# Patient Record
Sex: Female | Born: 1960 | Race: White | Hispanic: No | Smoking: Former smoker
Health system: Southern US, Community
[De-identification: ages and names within clinical notes are randomized; demographics above are authoritative.]

## PROBLEM LIST (undated history)

## (undated) DIAGNOSIS — E782 Mixed hyperlipidemia: Secondary | ICD-10-CM

## (undated) DIAGNOSIS — I10 Essential (primary) hypertension: Secondary | ICD-10-CM

## (undated) DIAGNOSIS — K219 Gastro-esophageal reflux disease without esophagitis: Secondary | ICD-10-CM

## (undated) DIAGNOSIS — F419 Anxiety disorder, unspecified: Secondary | ICD-10-CM

## (undated) DIAGNOSIS — F329 Major depressive disorder, single episode, unspecified: Secondary | ICD-10-CM

## (undated) DIAGNOSIS — R569 Unspecified convulsions: Secondary | ICD-10-CM

## (undated) DIAGNOSIS — Z8669 Personal history of other diseases of the nervous system and sense organs: Secondary | ICD-10-CM

## (undated) DIAGNOSIS — I428 Other cardiomyopathies: Secondary | ICD-10-CM

## (undated) DIAGNOSIS — E7800 Pure hypercholesterolemia, unspecified: Secondary | ICD-10-CM

## (undated) DIAGNOSIS — F32A Depression, unspecified: Secondary | ICD-10-CM

## (undated) DIAGNOSIS — K602 Anal fissure, unspecified: Secondary | ICD-10-CM

## (undated) DIAGNOSIS — I5022 Chronic systolic (congestive) heart failure: Secondary | ICD-10-CM

## (undated) DIAGNOSIS — E119 Type 2 diabetes mellitus without complications: Secondary | ICD-10-CM

## (undated) HISTORY — DX: Mixed hyperlipidemia: E78.2

## (undated) HISTORY — DX: Other cardiomyopathies: I42.8

## (undated) HISTORY — DX: Essential (primary) hypertension: I10

## (undated) HISTORY — PX: DILATION AND CURETTAGE OF UTERUS: SHX78

## (undated) HISTORY — PX: FOOT BONE EXCISION: SUR493

## (undated) HISTORY — DX: Chronic systolic (congestive) heart failure: I50.22

---

## 2003-02-27 HISTORY — PX: BACK SURGERY: SHX140

## 2003-05-20 ENCOUNTER — Ambulatory Visit (HOSPITAL_COMMUNITY): Admission: RE | Admit: 2003-05-20 | Discharge: 2003-05-20 | Payer: Self-pay | Admitting: Family Medicine

## 2003-05-27 ENCOUNTER — Ambulatory Visit (HOSPITAL_COMMUNITY): Admission: RE | Admit: 2003-05-27 | Discharge: 2003-05-28 | Payer: Self-pay | Admitting: Neurological Surgery

## 2007-02-13 ENCOUNTER — Other Ambulatory Visit: Admission: RE | Admit: 2007-02-13 | Discharge: 2007-02-13 | Payer: Self-pay | Admitting: Obstetrics and Gynecology

## 2007-02-24 ENCOUNTER — Ambulatory Visit (HOSPITAL_COMMUNITY): Admission: RE | Admit: 2007-02-24 | Discharge: 2007-02-24 | Payer: Self-pay | Admitting: Obstetrics & Gynecology

## 2007-03-05 ENCOUNTER — Ambulatory Visit (HOSPITAL_COMMUNITY): Admission: RE | Admit: 2007-03-05 | Discharge: 2007-03-05 | Payer: Self-pay | Admitting: Obstetrics & Gynecology

## 2007-09-15 ENCOUNTER — Ambulatory Visit (HOSPITAL_COMMUNITY): Admission: RE | Admit: 2007-09-15 | Discharge: 2007-09-15 | Payer: Self-pay | Admitting: Obstetrics and Gynecology

## 2008-03-01 ENCOUNTER — Other Ambulatory Visit: Admission: RE | Admit: 2008-03-01 | Discharge: 2008-03-01 | Payer: Self-pay | Admitting: Obstetrics and Gynecology

## 2008-03-18 ENCOUNTER — Ambulatory Visit (HOSPITAL_COMMUNITY): Admission: RE | Admit: 2008-03-18 | Discharge: 2008-03-18 | Payer: Self-pay | Admitting: Family Medicine

## 2008-09-16 ENCOUNTER — Ambulatory Visit (HOSPITAL_COMMUNITY): Admission: RE | Admit: 2008-09-16 | Discharge: 2008-09-16 | Payer: Self-pay | Admitting: Obstetrics & Gynecology

## 2009-04-28 ENCOUNTER — Other Ambulatory Visit
Admission: RE | Admit: 2009-04-28 | Discharge: 2009-04-28 | Payer: Self-pay | Source: Home / Self Care | Admitting: Obstetrics and Gynecology

## 2010-03-10 ENCOUNTER — Inpatient Hospital Stay (HOSPITAL_COMMUNITY)
Admission: AD | Admit: 2010-03-10 | Discharge: 2010-03-16 | Payer: Self-pay | Source: Home / Self Care | Attending: Family Medicine | Admitting: Family Medicine

## 2010-03-13 LAB — CBC
HCT: 40.5 % (ref 36.0–46.0)
Hemoglobin: 14 g/dL (ref 12.0–15.0)
MCH: 27.9 pg (ref 26.0–34.0)
MCHC: 34.6 g/dL (ref 30.0–36.0)
MCV: 80.8 fL (ref 78.0–100.0)
Platelets: 399 10*3/uL (ref 150–400)
RBC: 5.01 MIL/uL (ref 3.87–5.11)
RDW: 13.7 % (ref 11.5–15.5)
WBC: 17.6 10*3/uL — ABNORMAL HIGH (ref 4.0–10.5)

## 2010-03-13 LAB — COMPREHENSIVE METABOLIC PANEL
ALT: 10 U/L (ref 0–35)
AST: 11 U/L (ref 0–37)
Albumin: 3.4 g/dL — ABNORMAL LOW (ref 3.5–5.2)
Alkaline Phosphatase: 70 U/L (ref 39–117)
BUN: 5 mg/dL — ABNORMAL LOW (ref 6–23)
CO2: 27 mEq/L (ref 19–32)
Calcium: 9.6 mg/dL (ref 8.4–10.5)
Chloride: 98 mEq/L (ref 96–112)
Creatinine, Ser: 0.64 mg/dL (ref 0.4–1.2)
GFR calc Af Amer: >60 mL/min (ref >60)
GFR calc non Af Amer: >60 mL/min (ref >60)
Glucose, Bld: 148 mg/dL — ABNORMAL HIGH (ref 70–99)
Potassium: 2.8 mEq/L — ABNORMAL LOW (ref 3.5–5.1)
Sodium: 136 mEq/L (ref 135–145)
Total Bilirubin: 0.5 mg/dL (ref 0.3–1.2)
Total Protein: 6.9 g/dL (ref 6.0–8.3)

## 2010-03-13 LAB — GLUCOSE, CAPILLARY
Glucose-Capillary: 186 mg/dL — ABNORMAL HIGH (ref 70–99)
Glucose-Capillary: 198 mg/dL — ABNORMAL HIGH (ref 70–99)
Glucose-Capillary: 200 mg/dL — ABNORMAL HIGH (ref 70–99)
Glucose-Capillary: 149 mg/dL — ABNORMAL HIGH (ref 70–99)
Glucose-Capillary: 164 mg/dL — ABNORMAL HIGH (ref 70–99)
Glucose-Capillary: 163 mg/dL — ABNORMAL HIGH (ref 70–99)
Glucose-Capillary: 151 mg/dL — ABNORMAL HIGH (ref 70–99)
Glucose-Capillary: 160 mg/dL — ABNORMAL HIGH (ref 70–99)
Glucose-Capillary: 118 mg/dL — ABNORMAL HIGH (ref 70–99)
Glucose-Capillary: 165 mg/dL — ABNORMAL HIGH (ref 70–99)

## 2010-03-13 LAB — URINALYSIS, ROUTINE W REFLEX MICROSCOPIC
Bilirubin Urine: NEGATIVE
Hgb urine dipstick: NEGATIVE
Ketones, ur: NEGATIVE mg/dL
Nitrite: NEGATIVE
Protein, ur: NEGATIVE mg/dL
Specific Gravity, Urine: <1.005 — ABNORMAL LOW (ref 1.005–1.030)
Urine Glucose, Fasting: 100 mg/dL — AB
Urobilinogen, UA: 0.2 mg/dL (ref 0.0–1.0)
pH: 5.5 (ref 5.0–8.0)

## 2010-03-13 LAB — BASIC METABOLIC PANEL
BUN: 5 mg/dL — ABNORMAL LOW (ref 6–23)
BUN: 2 mg/dL — ABNORMAL LOW (ref 6–23)
CO2: 28 mEq/L (ref 19–32)
CO2: 26 mEq/L (ref 19–32)
Calcium: 8.6 mg/dL (ref 8.4–10.5)
Calcium: 8.2 mg/dL — ABNORMAL LOW (ref 8.4–10.5)
Chloride: 100 mEq/L (ref 96–112)
Chloride: 104 mEq/L (ref 96–112)
Creatinine, Ser: 0.7 mg/dL (ref 0.4–1.2)
Creatinine, Ser: 0.61 mg/dL (ref 0.4–1.2)
GFR calc Af Amer: >60 mL/min (ref >60)
GFR calc Af Amer: >60 mL/min (ref >60)
GFR calc non Af Amer: >60 mL/min (ref >60)
GFR calc non Af Amer: >60 mL/min (ref >60)
Glucose, Bld: 150 mg/dL — ABNORMAL HIGH (ref 70–99)
Glucose, Bld: 171 mg/dL — ABNORMAL HIGH (ref 70–99)
Potassium: 3.3 mEq/L — ABNORMAL LOW (ref 3.5–5.1)
Potassium: 3.2 mEq/L — ABNORMAL LOW (ref 3.5–5.1)
Sodium: 138 mEq/L (ref 135–145)
Sodium: 138 mEq/L (ref 135–145)

## 2010-03-13 LAB — DIFFERENTIAL
Basophils Absolute: 0 10*3/uL (ref 0.0–0.1)
Basophils Relative: 0 % (ref 0–1)
Eosinophils Absolute: 0.1 10*3/uL (ref 0.0–0.7)
Eosinophils Relative: 0 % (ref 0–5)
Lymphocytes Relative: 11 % — ABNORMAL LOW (ref 12–46)
Lymphs Abs: 1.9 10*3/uL (ref 0.7–4.0)
Monocytes Absolute: 1.6 10*3/uL — ABNORMAL HIGH (ref 0.1–1.0)
Monocytes Relative: 9 % (ref 3–12)
Neutro Abs: 14.1 10*3/uL — ABNORMAL HIGH (ref 1.7–7.7)
Neutrophils Relative %: 80 % — ABNORMAL HIGH (ref 43–77)

## 2010-03-15 LAB — CBC
HCT: 35.5 % — ABNORMAL LOW (ref 36.0–46.0)
Hemoglobin: 11.6 g/dL — ABNORMAL LOW (ref 12.0–15.0)
MCH: 26.9 pg (ref 26.0–34.0)
MCHC: 32.7 g/dL (ref 30.0–36.0)
MCV: 82.4 fL (ref 78.0–100.0)
Platelets: 298 10*3/uL (ref 150–400)
RBC: 4.31 MIL/uL (ref 3.87–5.11)
RDW: 13.5 % (ref 11.5–15.5)
WBC: 8.2 10*3/uL (ref 4.0–10.5)

## 2010-03-15 LAB — GLUCOSE, CAPILLARY
Glucose-Capillary: 147 mg/dL — ABNORMAL HIGH (ref 70–99)
Glucose-Capillary: 174 mg/dL — ABNORMAL HIGH (ref 70–99)
Glucose-Capillary: 158 mg/dL — ABNORMAL HIGH (ref 70–99)
Glucose-Capillary: 179 mg/dL — ABNORMAL HIGH (ref 70–99)
Glucose-Capillary: 144 mg/dL — ABNORMAL HIGH (ref 70–99)
Glucose-Capillary: 192 mg/dL — ABNORMAL HIGH (ref 70–99)
Glucose-Capillary: 200 mg/dL — ABNORMAL HIGH (ref 70–99)

## 2010-03-15 LAB — BASIC METABOLIC PANEL
BUN: 3 mg/dL — ABNORMAL LOW (ref 6–23)
CO2: 27 mEq/L (ref 19–32)
Calcium: 7.9 mg/dL — ABNORMAL LOW (ref 8.4–10.5)
Chloride: 109 mEq/L (ref 96–112)
Creatinine, Ser: 0.6 mg/dL (ref 0.4–1.2)
GFR calc Af Amer: >60 mL/min (ref >60)
GFR calc non Af Amer: >60 mL/min (ref >60)
Glucose, Bld: 156 mg/dL — ABNORMAL HIGH (ref 70–99)
Potassium: 3.5 mEq/L (ref 3.5–5.1)
Sodium: 141 mEq/L (ref 135–145)

## 2010-03-15 LAB — DIFFERENTIAL
Basophils Absolute: 0 10*3/uL (ref 0.0–0.1)
Basophils Relative: 1 % (ref 0–1)
Eosinophils Absolute: 0.2 10*3/uL (ref 0.0–0.7)
Eosinophils Relative: 3 % (ref 0–5)
Lymphocytes Relative: 20 % (ref 12–46)
Lymphs Abs: 1.7 10*3/uL (ref 0.7–4.0)
Monocytes Absolute: 0.6 10*3/uL (ref 0.1–1.0)
Monocytes Relative: 7 % (ref 3–12)
Neutro Abs: 5.7 10*3/uL (ref 1.7–7.7)
Neutrophils Relative %: 69 % (ref 43–77)

## 2010-03-17 NOTE — Progress Notes (Signed)
  NAMEKARESS, HARNER                  ACCOUNT NO.:  [id number]  MEDICAL RECORD NO.:  [id number]          PATIENT TYPE:  INP  LOCATION:  A302                          FACILITY:  [facility]  PHYSICIAN:  Mila Homer. Sudie Bailey, M.D.DATE OF BIRTH:  15-May-1960  DATE OF PROCEDURE: DATE OF DISCHARGE:                                PROGRESS NOTE   SUBJECTIVE:  She is still nauseated and still has some left lower quadrant abdominal pain but feeling somewhat better.  OBJECTIVE:  Temperature is 97.9, pulse 76, respiratory rate 20, blood pressure 155/105.  Recheck 140/90. Today she is sitting up in bed, in mild distress due to the pain.  Well- developed, well-nourished, oriented and alert. LUNGS:  Clear throughout, moving air well. HEART:  Regular rhythm, rate of about 80. ABDOMEN:  Soft and mildly obese without organomegaly or mass.  She still does have tenderness on deep palpation of the left lower quadrant.  Sugar is 187.  ASSESSMENT: 1. Acute diverticulitis, improved. 2. Chronic obstructive pulmonary disease. 3. Diabetes. 4. Benign essential hypertension. 5. Hypercholesterolemia. 6. Reflux esophagitis. 7. Nausea, probably secondary to metronidazole.  PLAN:  Decrease metronidazole from 500 mg q.8 h. to 250 mg IV q.8 h. Resume lisinopril 20 mg daily, metformin 500 mg b.i.d., simvastatin 20 mg q.h.s., omeprazole 20 mg daily, vitamin D3 one thousand units daily. Hopefully, discharge home tomorrow on p.o. medications.     Mila Homer. Sudie Bailey, M.D.     SDK/MEDQ  D:  03/15/2010  T:  03/15/2010  Job:  [id number]  Electronically Signed by John Giovanni M.D. on 03/17/2010 07:14:49 AM

## 2010-03-17 NOTE — Discharge Summary (Signed)
NAMEJESSICAANN, [name]                  ACCOUNT NO.:  [id number]  MEDICAL RECORD NO.:  [id number]          PATIENT TYPE:  INP  LOCATION:  A302                          FACILITY:  [facility]  PHYSICIAN:  [name], M.D.DATE OF BIRTH:  1960/05/15  DATE OF ADMISSION:  03/10/2010 DATE OF DISCHARGE:  LH                              DISCHARGE SUMMARY   This 50 year old was admitted to the hospital with acute diverticulitis. She had a fairly benign 7-day hospitalization extending from January 13 to March 16, 2010.  Vital signs remained stable.  Her admission white cell count was 17,600 of which 80% were neutrophils. Hemoglobin was 14.  Recheck CBC 3 days later showed a white cell count 8200 with 69 neutrophils, hemoglobin 11.6.  Admission CMP showed a potassium of 2.8, glucose 148, albumin 3.4 and recheck BMP showed a potassium 3.3, a glucose 150, and then at 3.2 with glucose 171 and a potassium of 3.5, a glucose 156 three days prior to discharge.  Her admission UA was negative.  CT scan of the abdomen and pelvis showed acute diverticulitis and a distal descending of proximal sigmoid colon without abscess or perforation.  He also showed cholelithiasis without acute cholecystitis. There is mild biliary ductal dilatation.  There are bronchitic changes at the lung bases.  The largest gallstone is 2.2 cm, but there were numerous other ones noted.  She was admitted to the hospital.  Put on IV of normal saline at 100 mL an hour, and started on Levaquin 500 mg IV daily, metronidazole 500 mg IV q.8 hours.  She had Accu-Cheks a.c. and h.s. with sensitive sliding scale insulin, and put on NicoDerm 21 mg patch with Vicodin 5/500, one to two q.4 hours for severe pain and Dilaudid 1 mg IV q.3 hours for severe pain.  She was also given Zofran 4 mg IV q.6 hours for nausea, vomiting.  She had runs of potassium to bring her potassium level up to normal, and was given Mylanta for heartburn.  By her  fourth day IV was DC'd, and she switched to Hep-Lock.  Two days later her metronidazole was decreased to 250 mg IV q.8,  and she had persistent nausea.  Despite this decrease she still had nausea after eating a meal which she had not had prior to her hospitalization. Otherwise, her left lower quadrant abdominal pain much improved.  Her heart rate had dropped from its admission 140 checked in my office to normal, and she was much improved and ready for discharge home.  She was discharged home on:  1. Cipro 500 mg b.i.d. for a 10-day course. 2. Metronidazole 250 mg t.i.d. for a 10-day course. 3. Hydrocodone/APAP 5/325 one to two tablets q.4 hours for pain (50,     no refills). 4. Odansetron  4 mg q.4 hours for nausea, vomiting (30, no refills). 5. She is to continue lisinopril at 20 mg daily. 6. Metformin 500 mg b.i.d. 7. Omeprazole 20 mg daily. 8. Simvastatin 20 mg q.h.s. 9. Vitamin D3 1000 units daily. 10.Acetaminophen as needed.  FINAL DISCHARGE DIAGNOSES: 1. Acute diverticulitis. 2. Cholelithiasis. 3. Benign essential  hypertension. 4. Type 2 diabetes. 5. Hypercholesterolemia. 6. Tobacco use disorder. 7. Hypokalemia.  FOLLOW-UP:  Was to be in the office in 6 days.     [name], M.D.     SDK/MEDQ  D:  03/16/2010  T:  03/16/2010  Job:  [id number]  Electronically Signed by [name] M.D. on 03/17/2010 07:13:53 AM

## 2010-03-17 NOTE — H&P (Signed)
[name], [name]                  ACCOUNT NO.:  [id number]  MEDICAL RECORD NO.:  [id number]          PATIENT TYPE:  INP  LOCATION:  A302                          FACILITY:  [facility]  PHYSICIAN:  Mila Homer. Sudie Bailey, M.D.DATE OF BIRTH:  1960-08-10  DATE OF ADMISSION:  03/10/2010 DATE OF DISCHARGE:  LH                             HISTORY & PHYSICAL   HISTORY OF PRESENT ILLNESS:  This 50 year old woman developed generalized abdominal pain starting about 5 days prior to admission.  It waxed and waned over the course of the week and then the day prior to admission it remained severe so she came to my office for evaluation Friday, January 13.  At this time she was having pain in the left lower quadrant.  She noted she had similar pain about 10 years ago but did not really know what caused it.  The patient has had really no bowel or bladder symptoms.  She has had no cardiopulmonary symptomatology or neurological symptomatology.  CHRONIC MEDICAL PROBLEMS: 1. Tobacco use disorder. 2,  Type 2 diabetes. 1. Diabetic neuropathy. 2. Obesity. 3. She probably also has COPD.  She was last seen in the office March of 2011.  The patient developed nausea yesterday but no vomiting, and has fever and chills.  ADMISSION EXAM:  Showed a middle-aged woman who was mildly toxic.  Her blood pressure 140/84 with a pulse of 140.  Repeat pulse of 130. SKIN:  Color was good.  Skin turgor normal.  Mucous membranes moist. She is well-developed, well-nourished, oriented and alert.  Speech and sentence structure were intact.  Sensorium was normal.  Mucous membranes were moist as noted.  There were negative anterior cervical and axillary lymph nodes. HEART:  Had a regular rhythm with a rate of 140 on initial exam.  There are no murmurs. LUNGS:  Were clear throughout, moving air well but decreased breath sounds. ABDOMEN:  Soft without tenderness on the right, but severe tenderness to palpation of the left  lower quadrant and a little bit in the left upper quadrant.  She had no CVA or flank pain tenderness.  There is no edema of the ankles.  ADMISSION DIAGNOSES: 1. Left lower quadrant abdominal pain, probably secondary to acute     diverticulitis. 2. Type 2 diabetes. 3. Tobacco use disorder. 4. Obesity. 5. Diabetic peripheral neuropathy. 6. Probable chronic obstructive pulmonary disease.  PLAN: 1. Admit her to the hospital. 2. Get a STAT CT of the abdomen. 3. STAT CBC, SMA-12 and MET-7 are pending as is an A1c. 4. She will be on Levaquin 500 mg IV daily and metronidazole 500 mg IV     every 8 hours. 5. I will keep her on NicoDerm 21 mg patch with Vicodin 5/500 one to     two eery 4 hours for pain and Dilaudid a milligram IV every 3 hours     for severe pain. 6. She will be followed by the doctor on-call this weekend, Dr.     Janna Arch.     Mila Homer. Sudie Bailey, M.D.     SDK/MEDQ  D:  03/10/2010  T:  03/10/2010  Job:  [id number]  Electronically Signed by John Giovanni M.D. on 03/17/2010 07:13:44 AM

## 2010-03-17 NOTE — Progress Notes (Signed)
  NAMEEMMALIN, JAQUESS                  ACCOUNT NO.:  [id number]  MEDICAL RECORD NO.:  [id number]          PATIENT TYPE:  INP  LOCATION:  A302                          FACILITY:  [facility]  PHYSICIAN:  Mila Homer. Sudie Bailey, M.D.DATE OF BIRTH:  August 17, 1960  DATE OF PROCEDURE: DATE OF DISCHARGE:                                PROGRESS NOTE   SUBJECTIVE:  She is still having some pain in the left lower quadrant. She has also had a headache.  OBJECTIVE:  She is sitting up in bed.  She is in no acute distress at present, well-developed and obese.  Temperature is 97.9, pulse 91, respiratory rate 20, blood pressure 161/99. LUNGS:  Clear throughout and she is moving air well. HEART:  Regular rhythm and rate about 80. ABDOMEN:  Soft without organomegaly or mass except there is tenderness on deep palpation of the left lower quadrant.  O2 sats 94% room air and her glucose is 144.  ASSESSMENT: 1. Acute diverticulitis, improved. 2. Chronic obstructive pulmonary disease. 3. Hyperglycemia.  PLAN:  Continue IV antibiotics.  She is off the IV fluids.  Recheck a CBC, BMP and a sed rate in the morning.     Mila Homer. Sudie Bailey, M.D.     SDK/MEDQ  D:  03/14/2010  T:  03/14/2010  Job:  [id number]  Electronically Signed by John Giovanni M.D. on 03/17/2010 07:14:41 AM

## 2010-03-17 NOTE — Progress Notes (Signed)
  NAMEBRELEIGH, CARPINO                  ACCOUNT NO.:  [id number]  MEDICAL RECORD NO.:  [id number]          PATIENT TYPE:  INP  LOCATION:  A302                          FACILITY:  [facility]  PHYSICIAN:  Mila Homer. Sudie Bailey, M.D.DATE OF BIRTH:  1960/12/09  DATE OF PROCEDURE: DATE OF DISCHARGE:                                PROGRESS NOTE   SUBJECTIVE:  Generally, she is better, but she did have nausea last night.  OBJECTIVE:  VITAL SIGNS:  Temperature is 97.6, pulse 80, respiratory rate 18, blood pressure 135/84. GENERAL: She is somewhat recumbent bed.  No acute distress.  Well- developed, well-nourished. LUNGS:  Fairly clear throughout, and she is moving air well without intercostal retraction without use accessory muscle respiration. HEART:  Regular rhythm rate about 80. ABDOMEN:  Soft without organomegaly or masses.  She still does have some tenderness in the left lower quadrant.  LABORATORY DATA:  Her white cell count is 8200 with 69% neutrophils, 20 lymphs.  Hemoglobin 11.6.  Her glucose is 156, BUN 3.  On admission she had a white cell count 17,600.  Abdominal CT scan did show acute diverticulitis in the distal descending colon  She also had cholelithiasis and findings consistent with bronchitis or asthma in the lung bases.  ASSESSMENT: 1. Acute diverticulitis. 2. Probable chronic obstructive pulmonary disease . 3. Cholelithiasis. 4. Degenerative disk disease at L4-5 and L5-S1. 5. Hyperglycemia.  PLAN:  Reviewed her glucoses today which were 147 and 165.  O2 sat was stable at 97%.  We will discontinue the IV, which is currently 100 mL an hour, switch to Hep-Lock, get her up in a chair to see if she feels better during the course of the day with possible discharge tomorrow or the next day.     Mila Homer. Sudie Bailey, M.D.    SDK/MEDQ  D:  03/13/2010  T:  03/13/2010  Job:  [id number]  Electronically Signed by John Giovanni M.D. on 03/17/2010 07:14:35 AM

## 2010-03-20 LAB — GLUCOSE, CAPILLARY
Glucose-Capillary: 187 mg/dL — ABNORMAL HIGH (ref 70–99)
Glucose-Capillary: 151 mg/dL — ABNORMAL HIGH (ref 70–99)
Glucose-Capillary: 187 mg/dL — ABNORMAL HIGH (ref 70–99)
Glucose-Capillary: 172 mg/dL — ABNORMAL HIGH (ref 70–99)
Glucose-Capillary: 221 mg/dL — ABNORMAL HIGH (ref 70–99)
Glucose-Capillary: 149 mg/dL — ABNORMAL HIGH (ref 70–99)

## 2010-06-16 ENCOUNTER — Other Ambulatory Visit (HOSPITAL_COMMUNITY)
Admission: RE | Admit: 2010-06-16 | Discharge: 2010-06-16 | Disposition: A | Discharge status: Home / Self Care | Payer: Medicaid Other | Source: Ambulatory Visit | Attending: Obstetrics and Gynecology | Admitting: Obstetrics and Gynecology

## 2010-06-16 ENCOUNTER — Other Ambulatory Visit: Payer: Self-pay | Admitting: Obstetrics & Gynecology

## 2010-06-16 DIAGNOSIS — Z01419 Encounter for gynecological examination (general) (routine) without abnormal findings: Secondary | ICD-10-CM | POA: Insufficient documentation

## 2010-06-16 DIAGNOSIS — N6310 Unspecified lump in the right breast, unspecified quadrant: Secondary | ICD-10-CM

## 2010-06-21 ENCOUNTER — Ambulatory Visit (HOSPITAL_COMMUNITY)
Admission: RE | Admit: 2010-06-21 | Discharge: 2010-06-21 | Disposition: A | Discharge status: Home / Self Care | Payer: Medicaid Other | Source: Ambulatory Visit | Attending: Obstetrics & Gynecology | Admitting: Obstetrics & Gynecology

## 2010-06-21 ENCOUNTER — Other Ambulatory Visit (HOSPITAL_COMMUNITY): Payer: Self-pay | Admitting: Obstetrics & Gynecology

## 2010-06-21 DIAGNOSIS — N6310 Unspecified lump in the right breast, unspecified quadrant: Secondary | ICD-10-CM

## 2010-06-21 DIAGNOSIS — N63 Unspecified lump in unspecified breast: Secondary | ICD-10-CM | POA: Insufficient documentation

## 2010-07-14 NOTE — Op Note (Signed)
[name], [name]                            ACCOUNT NO.:  [id number]   MEDICAL RECORD NO.:  [id number]                   PATIENT TYPE:  OIB   LOCATION:  3025                                 FACILITY:  [facility]   PHYSICIAN:  Stefani Dama, M.D.               DATE OF BIRTH:  January 08, 1961   DATE OF PROCEDURE:  05/27/2003  DATE OF DISCHARGE:                                 OPERATIVE REPORT   PREOPERATIVE DIAGNOSIS:  Herniated nucleus pulposus, L5-S1, on right with  right lumbar radiculopathy.   POSTOPERATIVE DIAGNOSIS:  Herniated nucleus pulposus, L5-S1, on right with  right lumbar radiculopathy.   PROCEDURE:  METRx microendoscopic diskectomy, L5-S1 right with operating  microscope microdissection technique.   SURGEON:  Stefani Dama, M.D.   ANESTHESIA:  Dr. Kerri Perches.   INDICATIONS:  The patient is a 50 year old individual who has had  significant back and right leg pain.  She has evidence of a herniated  nucleus pulposus at L5-S1 causing compression of the S1 nerve root.  She has  been advised regarding surgical decompression.   PROCEDURE:  The patient was brought to the operating room supine on the  stretcher.  After smooth induction of general endotracheal anesthesia, she  was turned prone.  The back was shaved, prepped with Duraprep, and draped in  a sterile fashion.  Using fluoroscopic guidance, the L5-S1 interlaminar  space was localized in the PA projection and then a K-wire was passed down  to the laminar arch of L5 in the lateral projection and then a winding  technique was used to insert a series of dilators to the 18 mm diameter.  The interlaminar space was then fully identified at the L5-S1 side on the  right side and with the area being opened up, an 18 mm wide x 7 cm deep  endoscopic cannula was affixed to the operating table with clamp.  The  microscope was draped and brought into the field and then using  microdissection approach, the soft tissues were cleared  from the  interlaminar space at L5-S1 on the right side.  High speed air drill and 2.3  mm dissecting tool was then used to remove the inferior margin of the lamina  of L5 out to the medial wall of the facet.  The yellow ligament in this area  was then taken up until the dura was identified.  The common dural tube was  traced out laterally and in this area under some further redundant yellow  ligament there was noted to be the S1 nerve root tented dorsally.  It was  gently cleared, and the lateral aspect of the nerve root was identified so  that the structures could be dissected medially.  A root was tented over a  significant mass underneath it by gently using microdissection technique and  mobilizing the nerve root medially.  Several fragments of disk were  encountered.  These were removed and allowed for nearly immediate  decompression of the S1 nerve root.  Further fragments were removed, and  some were attached to an opening through the posterior longitudinal ligament  inferior to the disk space.  This entry entered into the disk space proper  and required opening with a 15 blade and removal with a combination of  punches and disk rongeurs to evacuate a significant quantity of degenerated  disk material from within the disk space itself.  Care was taken to  carefully evacuate the disk space as best as possible, both medially and  laterally.  Several other fragments of disk were removed from within the  disk space itself to allow for this process.  In the end the disk space was  cleared of all the free fragments of disk material that could be had from  within the space.  The area around the posterior longitudinal ligament was  carefully cauterized and dissected free and clear.  The S1 nerve root was  noted to be free and clear of any nerve root compromise.  The common dural  tube was intact.  No spinal fluid leaks were obtained.  Hemostasis in the  epidural space was meticulously  obtained.   Then the microscope was removed from the field; 0.5 cc of Fentanyl was  placed in the epidural space prior to this.  The lumbodorsal fascia was then  closed with a singular 3-0 Vicryl suture and 3-0 Vicryl was used in the  subcuticular tissues.  Dermabond was placed on the skin.   The patient tolerated the procedure well and was returned to the recovery  room in stable condition.                                               Stefani Dama, M.D.    [name]  D:  05/27/2003  T:  05/27/2003  Job:  [id number]

## 2011-09-25 ENCOUNTER — Telehealth (INDEPENDENT_AMBULATORY_CARE_PROVIDER_SITE_OTHER): Payer: Self-pay | Admitting: *Deleted

## 2011-09-25 ENCOUNTER — Other Ambulatory Visit (INDEPENDENT_AMBULATORY_CARE_PROVIDER_SITE_OTHER): Payer: Self-pay | Admitting: *Deleted

## 2011-09-25 ENCOUNTER — Encounter (HOSPITAL_COMMUNITY): Payer: Self-pay | Admitting: Pharmacy Technician

## 2011-09-25 DIAGNOSIS — K6289 Other specified diseases of anus and rectum: Secondary | ICD-10-CM

## 2011-09-25 DIAGNOSIS — R197 Diarrhea, unspecified: Secondary | ICD-10-CM

## 2011-09-25 DIAGNOSIS — Z1211 Encounter for screening for malignant neoplasm of colon: Secondary | ICD-10-CM

## 2011-09-25 MED ORDER — PEG-KCL-NACL-NASULF-NA ASC-C 100 G PO SOLR: 1.0000 | Freq: Once | ORAL | Status: DC

## 2011-09-25 NOTE — Telephone Encounter (Signed)
PCP/Requesting MD: Sudie Bailey  Name & DOB: [name] 10/01/1960     Procedure: tcs  Reason/Indication:  Diarrhea, rectal pain  Has patient had this procedure before?  no  If so, when, by whom and where?    Is there a family history of colon cancer?  no  Who?  What age when diagnosed?    Is patient diabetic?   borderline      Does patient have prosthetic heart valve?  no  Do you have a pacemaker?  no  Has patient had joint replacement within last 12 months?  no  Is patient on Coumadin, Plavix and/or Aspirin? no  Medications: omeprazole 20 mg daily, metformin 500 mg 2 in am & 2 in pm, lisinopril 10 mg daily, niaspan 500 mg dailt, vit D 3 1000 IU 5 tabs in morning, HCTZ 25 mg daily, lorazepam 1 mg daily, amitriptyline 10 mg daily  Allergies: nkda  Medication Adjustment: 1/2 metformin day before  Procedure date & time: 10/01/11 @ 730

## 2011-09-25 NOTE — Telephone Encounter (Signed)
agree

## 2011-09-25 NOTE — Telephone Encounter (Signed)
Patient needs movi prep 

## 2011-10-01 ENCOUNTER — Ambulatory Visit (HOSPITAL_COMMUNITY)
Admission: RE | Admit: 2011-10-01 | Discharge: 2011-10-01 | Disposition: A | Discharge status: Home / Self Care | Payer: Medicaid Other | Source: Ambulatory Visit | Attending: Internal Medicine | Admitting: Internal Medicine

## 2011-10-01 ENCOUNTER — Encounter (HOSPITAL_COMMUNITY): Payer: Self-pay

## 2011-10-01 ENCOUNTER — Encounter (HOSPITAL_COMMUNITY)
Admission: RE | Disposition: A | Discharge status: Home / Self Care | Payer: Self-pay | Source: Ambulatory Visit | Attending: Internal Medicine

## 2011-10-01 DIAGNOSIS — K6289 Other specified diseases of anus and rectum: Secondary | ICD-10-CM

## 2011-10-01 DIAGNOSIS — K602 Anal fissure, unspecified: Secondary | ICD-10-CM

## 2011-10-01 DIAGNOSIS — D126 Benign neoplasm of colon, unspecified: Secondary | ICD-10-CM

## 2011-10-01 DIAGNOSIS — E119 Type 2 diabetes mellitus without complications: Secondary | ICD-10-CM | POA: Insufficient documentation

## 2011-10-01 DIAGNOSIS — K5730 Diverticulosis of large intestine without perforation or abscess without bleeding: Secondary | ICD-10-CM

## 2011-10-01 DIAGNOSIS — R197 Diarrhea, unspecified: Secondary | ICD-10-CM

## 2011-10-01 DIAGNOSIS — K644 Residual hemorrhoidal skin tags: Secondary | ICD-10-CM | POA: Insufficient documentation

## 2011-10-01 DIAGNOSIS — Z01812 Encounter for preprocedural laboratory examination: Secondary | ICD-10-CM | POA: Insufficient documentation

## 2011-10-01 DIAGNOSIS — I10 Essential (primary) hypertension: Secondary | ICD-10-CM | POA: Insufficient documentation

## 2011-10-01 DIAGNOSIS — Z79899 Other long term (current) drug therapy: Secondary | ICD-10-CM | POA: Insufficient documentation

## 2011-10-01 HISTORY — PX: COLONOSCOPY: SHX5424

## 2011-10-01 HISTORY — DX: Anxiety disorder, unspecified: F41.9

## 2011-10-01 HISTORY — DX: Gastro-esophageal reflux disease without esophagitis: K21.9

## 2011-10-01 HISTORY — DX: Unspecified convulsions: R56.9

## 2011-10-01 HISTORY — DX: Essential (primary) hypertension: I10

## 2011-10-01 HISTORY — DX: Major depressive disorder, single episode, unspecified: F32.9

## 2011-10-01 HISTORY — DX: Depression, unspecified: F32.A

## 2011-10-01 SURGERY — COLONOSCOPY
Anesthesia: Moderate Sedation

## 2011-10-01 MED ORDER — MEPERIDINE HCL 50 MG/ML IJ SOLN
INTRAMUSCULAR | Status: AC
  Filled 2011-10-01: qty 1

## 2011-10-01 MED ORDER — SODIUM CHLORIDE 0.45 % IV SOLN
Freq: Once | INTRAVENOUS | Status: AC
  Administered 2011-10-01: 07:00:00 via INTRAVENOUS

## 2011-10-01 MED ORDER — NITROGLYCERIN 0.4 % RE OINT: 0.5000 [in_us] | TOPICAL_OINTMENT | Freq: Two times a day (BID) | RECTAL | Status: DC

## 2011-10-01 MED ORDER — MEPERIDINE HCL 50 MG/ML IJ SOLN
INTRAMUSCULAR | Status: DC | PRN
  Administered 2011-10-01: 25 mg via INTRAVENOUS

## 2011-10-01 MED ORDER — STERILE WATER FOR IRRIGATION IR SOLN
Status: DC | PRN
  Administered 2011-10-01: 08:00:00

## 2011-10-01 MED ORDER — MIDAZOLAM HCL 5 MG/5ML IJ SOLN
INTRAMUSCULAR | Status: DC | PRN
  Administered 2011-10-01 (×4): 2 mg via INTRAVENOUS

## 2011-10-01 MED ORDER — NYSTATIN-TRIAMCINOLONE 100000-0.1 UNIT/GM-% EX OINT: TOPICAL_OINTMENT | Freq: Every day | CUTANEOUS | Status: AC

## 2011-10-01 MED ORDER — LIDOCAINE HCL 2 % EX GEL
CUTANEOUS | Status: DC | PRN
  Administered 2011-10-01: 1 via TOPICAL

## 2011-10-01 MED ORDER — MIDAZOLAM HCL 5 MG/5ML IJ SOLN
INTRAMUSCULAR | Status: AC
  Filled 2011-10-01: qty 10

## 2011-10-01 MED ORDER — DICYCLOMINE HCL 10 MG PO CAPS: 10.0000 mg | ORAL_CAPSULE | Freq: Two times a day (BID) | ORAL | Status: DC

## 2011-10-01 NOTE — H&P (Signed)
[name] is an 51 y.o. female.   Chief Complaint: Patient is here for colonoscopy. HPI: Patient is 51 year old Caucasian female who is a year for 3 years. She generally has 4-5 bowel movements per day. She also has intermittent nocturnal diarrhea. For the last 3 weeks she's been having solution rectal pain with bowel movements. She was seen by Dr. Sudie Bailey and felt to have internal hemorrhoids and given Anusol-HC suppository using this medication made her pain worse. She denies abdominal pain or rectal bleeding. She does not take NSAIDs. Family history is negative for colorectal carcinoma  or IBD.  Past Medical History  Diagnosis Date  . Hypertension   . Diabetes mellitus   . Anxiety   . Depression   . GERD (gastroesophageal reflux disease)   . Seizures     none since age 74-no meds since age 18 and no seizures since ge 6    Past Surgical History  Procedure Date  . Dilation and curettage of uterus   . Back surgery 2005    lumbar disckectomy  . Foot bone excision     right    History reviewed. No pertinent family history. Social History:  reports that she has been smoking Cigarettes.  She has a 10 pack-year smoking history. She does not have any smokeless tobacco history on file. She reports that she does not drink alcohol or use illicit drugs.  Allergies: No Known Allergies  Medications Prior to Admission  Medication Sig Dispense Refill  . amitriptyline (ELAVIL) 10 MG tablet Take 10 mg by mouth at bedtime.      . cholecalciferol (VITAMIN D) 1000 UNITS tablet Take 5,000 Units by mouth daily.      . hydrochlorothiazide (HYDRODIURIL) 25 MG tablet Take 25 mg by mouth daily.      [name] lisinopril (PRINIVIL,ZESTRIL) 20 MG tablet Take 20 mg by mouth daily.      [name] LORazepam (ATIVAN) 1 MG tablet Take 1 mg by mouth at bedtime.      . metFORMIN (GLUCOPHAGE) 500 MG tablet Take 1,000 mg by mouth 2 (two) times daily.      . niacin (NIASPAN) 500 MG CR tablet Take 500 mg by mouth daily.      [name]  omeprazole (PRILOSEC) 20 MG capsule Take 20 mg by mouth daily.      . peg 3350 powder (MOVIPREP) 100 G SOLR Take 1 kit (100 g total) by mouth once.  1 kit  0    Results for orders placed during the hospital encounter of 10/01/11 (from the past 48 hour(s))  GLUCOSE, CAPILLARY     Status: Abnormal   Collection Time   10/01/11  7:11 AM      Component Value Range Comment   Glucose-Capillary 150 (*) 70 - 99 mg/dL    No results found.  ROS  Blood pressure 108/68, pulse 105, temperature 98 F (36.7 C), temperature source Oral, resp. rate 16, height 5\' 6"  (1.676 m), weight 184 lb (83.462 kg), last menstrual period 08/22/2011, SpO2 96.00%. Physical Exam  Constitutional: She appears well-developed and well-nourished.  HENT:  Mouth/Throat: Oropharynx is clear and moist.  Eyes: Conjunctivae are normal. No scleral icterus.  Neck: No thyromegaly present.  Cardiovascular: Normal rate and normal heart sounds.   No murmur heard. Respiratory: Effort normal.  GI: Soft. She exhibits no distension and no mass. There is no tenderness.  Musculoskeletal: She exhibits no edema.  Lymphadenopathy:    She has no cervical adenopathy.  Neurological: She is  alert.  Skin: Skin is warm and dry.     Assessment/Plan Chronic diarrhea. Rectal pain. Diagnostic colonoscopy.  Maelin Kurkowski U 10/01/2011, 7:34 AM

## 2011-10-01 NOTE — Op Note (Signed)
COLONOSCOPY PROCEDURE REPORT  PATIENT:  [name]  MR#:  [id number] Birthdate:  [birth year], 51 y.o., female Endoscopist:  Dr. Malissa Hippo, MD Referred By:  Dr. Mila Homer. Sudie Bailey, MD Procedure Date: 10/01/2011  Procedure:   Colonoscopy  Indications:  Patient is 51 year old Caucasian female with chronic diarrhea who is also having excruciating rectal pain with bowel movements. She is undergoing diagnostic colonoscopy.  Informed Consent:  The procedure and risks were reviewed with the patient and informed consent was obtained.  Medications:  Demerol 25 mg IV Versed 8 mg IV  Description of procedure:  After a digital rectal exam was performed, that colonoscope was advanced from the anus through the rectum and colon to the area of the cecum, ileocecal valve and appendiceal orifice. The cecum was deeply intubated. These structures were well-seen and photographed for the record. From the level of the cecum and ileocecal valve, the scope was slowly and cautiously withdrawn. The mucosal surfaces were carefully surveyed utilizing scope tip to flexion to facilitate fold flattening as needed. The scope was pulled down into the rectum where a thorough exam including retroflexion was performed. Terminal ileum was also examined.  Findings:   Prep excellent. Normal terminal ileum. 4 mm cecal polyp ablated via cold biopsy. 5 mm polyp snared from distal sigmoid colon. Scattered diverticula throughout the colon. Random biopsy taken from normal appearing mucosa of sigmoid colon. Normal rectal mucosa. Small hemorrhoids below the dentate line and single anal fissure.  Therapeutic/Diagnostic Maneuvers Performed:  See above  Complications:  None  Cecal Withdrawal Time:  17 minutes  Impression:  Normal terminal ileum. Pancolonic diverticulosis. No endoscopic evidence of colitis. Random biopsy taken from mucosa of sigmoid colon looking for microscopic colitis. Small cecal polyp ablated via cold  biopsy. 5 mm polyp snared from sigmoid colon. Small external hemorrhoids and anal fissure.  Recommendations:  Standard instructions given.  Dicyclomine 10 mg by mouth twice a day. Mycolog-II ointment to be applied to perineal area daily at bedtime. Nitroglycerin ointment 0.4% to be applied to anal canal twice daily for 4 weeks. High-fiber diet and fiber supplement 3-4 g daily. Office visit in 4 weeks.  REHMAN,NAJEEB U  10/01/2011 8:20 AM  CC: Dr. Milana Obey, MD & Dr. Bonnetta Barry ref. provider found

## 2011-10-01 NOTE — Progress Notes (Signed)
Patient given discharge instructions polyps and colonoscopy. Instructed to go by [facility] pharmacy and pick of prescriptions. Follow up appt with dr Karilyn Cota sept 9 at 10:30

## 2011-10-03 ENCOUNTER — Encounter (HOSPITAL_COMMUNITY): Payer: Self-pay | Admitting: Internal Medicine

## 2011-10-04 ENCOUNTER — Encounter (INDEPENDENT_AMBULATORY_CARE_PROVIDER_SITE_OTHER): Payer: Self-pay | Admitting: *Deleted

## 2011-10-04 ENCOUNTER — Telehealth (INDEPENDENT_AMBULATORY_CARE_PROVIDER_SITE_OTHER): Payer: Self-pay | Admitting: *Deleted

## 2011-10-04 NOTE — Telephone Encounter (Signed)
Patient had TCS on 10/01/11, she calls in today to see if we have samples for acid reflux

## 2011-10-05 NOTE — Telephone Encounter (Signed)
Samples left at front desk 

## 2011-10-05 NOTE — Telephone Encounter (Signed)
[name] - Would you please call and see what PPI she may need ,Thank You

## 2011-11-05 ENCOUNTER — Ambulatory Visit (INDEPENDENT_AMBULATORY_CARE_PROVIDER_SITE_OTHER): Payer: Medicaid Other | Admitting: Internal Medicine

## 2011-11-05 ENCOUNTER — Encounter (INDEPENDENT_AMBULATORY_CARE_PROVIDER_SITE_OTHER): Payer: Self-pay | Admitting: Internal Medicine

## 2011-11-05 VITALS — BP 102/68 | HR 78 | Temp 98.2°F | Resp 20 | Ht 66.0 in | Wt 184.2 lb

## 2011-11-05 DIAGNOSIS — E119 Type 2 diabetes mellitus without complications: Secondary | ICD-10-CM | POA: Insufficient documentation

## 2011-11-05 DIAGNOSIS — K602 Anal fissure, unspecified: Secondary | ICD-10-CM

## 2011-11-05 DIAGNOSIS — I10 Essential (primary) hypertension: Secondary | ICD-10-CM | POA: Insufficient documentation

## 2011-11-05 DIAGNOSIS — R197 Diarrhea, unspecified: Secondary | ICD-10-CM | POA: Insufficient documentation

## 2011-11-05 DIAGNOSIS — E1122 Type 2 diabetes mellitus with diabetic chronic kidney disease: Secondary | ICD-10-CM | POA: Insufficient documentation

## 2011-11-05 DIAGNOSIS — K589 Irritable bowel syndrome without diarrhea: Secondary | ICD-10-CM

## 2011-11-05 MED ORDER — PSYLLIUM 55.46 % PO POWD: 1.0000 | Freq: Every day | ORAL | Status: DC

## 2011-11-05 MED ORDER — NITROGLYCERIN 0.4 % RE OINT: 0.5000 [in_us] | TOPICAL_OINTMENT | Freq: Two times a day (BID) | RECTAL | Status: DC

## 2011-11-05 NOTE — Progress Notes (Signed)
Presenting complaint;  Followup for diarrhea and anorectal pain.  Subjective:  Patient is 51 year old Caucasian female who was recently evaluated for diarrhea and excruciating anorectal pain. She underwent colonoscopy on 10/01/2011. She had 2 small adenomas removed from her colon. She had scattered diverticula throughout the colon. Random biopsies from sigmoid colon were normal. She also had small external hemorrhoids and anal fissure. She was begun on dicyclomine topical nitroglycerin ointment 2 in canal twice a day and Mycolog-II to be applied to perianal area. She was also advised to take fiber supplements daily. She did not keep stool diary as recommended. She has noted decrease in her diarrhea. Actually this morning she thought she was constipated. While on nitroglycerin ointment she noted decrease in spells of severe pain but she had a bad weekend. She states her heartburn is well controlled with therapy. She denies melena, rectal bleeding or abdominal pain. She says she has lost 20 pounds in the last year. She is not sure how much weight she has lost and the last 3 months. She remains under a lot of stress because of family issues.  Current Medications: Current Outpatient Prescriptions  Medication Sig Dispense Refill  . amitriptyline (ELAVIL) 10 MG tablet Take 10 mg by mouth at bedtime.      . dicyclomine (BENTYL) 10 MG capsule Take 1 capsule (10 mg total) by mouth 2 (two) times daily.  60 capsule  5  . hydrochlorothiazide (HYDRODIURIL) 25 MG tablet Take 25 mg by mouth daily.      [name] lisinopril (PRINIVIL,ZESTRIL) 20 MG tablet Take 20 mg by mouth daily.      [name] LORazepam (ATIVAN) 1 MG tablet Take 1 mg by mouth at bedtime.      . metFORMIN (GLUCOPHAGE) 500 MG tablet Take 1,000 mg by mouth 2 (two) times daily.      . niacin (NIASPAN) 500 MG CR tablet Take 500 mg by mouth daily.      . Nitroglycerin 0.4 % OINT Place 0.5 inches rectally 2 (two) times daily.  30 g  1  . norethindrone  (MICRONOR,CAMILA,ERRIN) 0.35 MG tablet Take 1 tablet by mouth daily.      [name] nystatin-triamcinolone ointment (MYCOLOG) Apply topically at bedtime. Applied to perianal area daily at bedtime for 2 weeks and thereafter as needed  30 g  0  . omeprazole (PRILOSEC) 20 MG capsule Take 20 mg by mouth daily.         Objective: Blood pressure 102/68, pulse 78, temperature 98.2 F (36.8 C), temperature source Oral, resp. rate 20, height 5\' 6"  (1.676 m), weight 184 lb 3.2 oz (83.553 kg), last menstrual period 09/25/2011. Patient is alert and in no acute distress. Conjunctiva is pink. Sclera is nonicteric Oropharyngeal mucosa is normal. No neck masses or thyromegaly noted. Abdomen is soft and nontender without organomegaly or masses. No LE edema or clubbing noted.   Assessment:  #1. Anorectal pain appears to be secondary to anal fissure documented on colonoscopy 5 weeks ago. If she fails to respond to second course of nitroglycerin ointment she will need surgical intervention.  Diarrhea seemed to aggravate her symptoms. Diarrhea appears to be secondary to IBS. #2. Chronic GERD. Symptoms well controlled with therapy.   Plan:  Metamucil one pack by mouth daily; samples given. Resume nitroglycerin ointment application to anal canal twice daily until office visit. Keep symptom diary until office visit in two-months.

## 2011-11-05 NOTE — Patient Instructions (Addendum)
Please keep stool diary as recommended.

## 2012-01-07 ENCOUNTER — Encounter (INDEPENDENT_AMBULATORY_CARE_PROVIDER_SITE_OTHER): Payer: Self-pay | Admitting: Internal Medicine

## 2012-01-07 ENCOUNTER — Ambulatory Visit (INDEPENDENT_AMBULATORY_CARE_PROVIDER_SITE_OTHER): Payer: Medicaid Other | Admitting: Internal Medicine

## 2012-01-07 VITALS — BP 100/62 | HR 88 | Temp 98.2°F | Ht 65.5 in | Wt 189.2 lb

## 2012-01-07 DIAGNOSIS — K602 Anal fissure, unspecified: Secondary | ICD-10-CM

## 2012-01-07 DIAGNOSIS — R197 Diarrhea, unspecified: Secondary | ICD-10-CM

## 2012-01-07 NOTE — Progress Notes (Addendum)
Subjective:     Patient ID: [name], female   DOB: 23-Apr-1960, 51 y.o.   MRN: [id number]  HPI 51 yr old female here today for f/u for diarrhea and excruciating anorectal pain. She underwent a colonoscopy 11/01/2011. She had 2 small adenomas removed from her colon. She had scattered diverticula thrughout the colon.  She also had small external hemorrhoids and an anal fissure. She tells me today she is doing good. She did have an episode about a month ago with rectal pain and used the Nitroglycerin oint. She says it cleared in about 2-3 weeks. There is no rectal pain now.  Stools are soft and some are very loose. She is having 3 stools a day. Appetite is good. No weight loss. She actually is trying to loose weight.  Sometimes she feels like foods are lodging.  Sometimes her medications are hard to get down. She usually has dysphagia to liquids. Acid reflux is controlled with omeprazole    Review of Systems see hpi Current Outpatient Prescriptions  Medication Sig Dispense Refill  . amitriptyline (ELAVIL) 10 MG tablet Take 10 mg by mouth at bedtime.      . dicyclomine (BENTYL) 10 MG capsule Take 1 capsule (10 mg total) by mouth 2 (two) times daily.  60 capsule  5  . hydrochlorothiazide (HYDRODIURIL) 25 MG tablet Take 25 mg by mouth daily.      [name] lisinopril (PRINIVIL,ZESTRIL) 20 MG tablet Take 20 mg by mouth daily.      [name] LORazepam (ATIVAN) 1 MG tablet Take 1 mg by mouth at bedtime.      . metFORMIN (GLUCOPHAGE) 500 MG tablet Take 500 mg by mouth 2 (two) times daily.       . niacin (NIASPAN) 500 MG CR tablet Take 500 mg by mouth daily.      [name] omeprazole (PRILOSEC) 20 MG capsule Take 20 mg by mouth daily.      . Nitroglycerin 0.4 % OINT Place 0.5 inches rectally 2 (two) times daily.  30 g  1  . norethindrone (MICRONOR,CAMILA,ERRIN) 0.35 MG tablet Take 1 tablet by mouth daily.      [name] nystatin-triamcinolone ointment (MYCOLOG) Apply topically at bedtime. Applied to perianal area daily at bedtime for  2 weeks and thereafter as needed  30 g  0   Past Medical History  Diagnosis Date  . Hypertension   . Diabetes mellitus   . Anxiety   . Depression   . GERD (gastroesophageal reflux disease)   . Seizures     none since age 52-no meds since age 24 and no seizures since ge 6       Objective:   Physical Exam  Filed Vitals:   01/07/12 0920  BP: 100/62  Pulse: 88  Temp: 98.2 F (36.8 C)  Height: 5' 5.5" (1.664 m)  Weight: 189 lb 3.2 oz (85.821 kg)   Alert and oriented. Skin warm and dry. Oral mucosa is moist.   . Sclera anicteric, conjunctivae is pink. Thyroid not enlarged. No cervical lymphadenopathy. Lungs clear. Heart regular rate and rhythm.  Abdomen is soft. Bowel sounds are positive. No hepatomegaly. No abdominal masses felt. No tenderness.  No edema to lower extremities. Patient is alert and oriented.      Assessment:   Anal fissure which has now healed. No rectal pain. She occasionally has loose stools. She usually has 3 stools a day. Was intolerant of Metamucil.     Plan:   Rectal pain from anal fissure.  which has now resolved. Diarrhea occasionally.  Try FiberOne bars Use NTG oint if you began to have rectal pain as before. Use small amt (pea size). Call our office if any problems. Prilosec samples (2 boxes given to patient).

## 2012-01-07 NOTE — Patient Instructions (Addendum)
[employer]. If anal fissure reoccurs, start the NTG oint. OV prn

## 2012-11-13 ENCOUNTER — Other Ambulatory Visit (HOSPITAL_COMMUNITY): Payer: Self-pay | Admitting: *Deleted

## 2012-11-13 ENCOUNTER — Ambulatory Visit (HOSPITAL_COMMUNITY)
Admission: RE | Admit: 2012-11-13 | Discharge: 2012-11-13 | Disposition: A | Discharge status: Home / Self Care | Payer: Disability Insurance | Source: Ambulatory Visit | Attending: Family Medicine | Admitting: Family Medicine

## 2012-11-13 DIAGNOSIS — M545 Low back pain, unspecified: Secondary | ICD-10-CM | POA: Insufficient documentation

## 2012-11-13 DIAGNOSIS — Z029 Encounter for administrative examinations, unspecified: Secondary | ICD-10-CM

## 2013-01-13 ENCOUNTER — Other Ambulatory Visit (HOSPITAL_COMMUNITY): Payer: Self-pay | Admitting: Physician Assistant

## 2013-01-13 DIAGNOSIS — Z139 Encounter for screening, unspecified: Secondary | ICD-10-CM

## 2013-01-15 ENCOUNTER — Encounter (INDEPENDENT_AMBULATORY_CARE_PROVIDER_SITE_OTHER): Payer: Self-pay | Admitting: *Deleted

## 2013-01-20 ENCOUNTER — Ambulatory Visit (HOSPITAL_COMMUNITY): Payer: Disability Insurance

## 2013-01-26 ENCOUNTER — Ambulatory Visit (HOSPITAL_COMMUNITY)
Admission: RE | Admit: 2013-01-26 | Discharge: 2013-01-26 | Disposition: A | Discharge status: Home / Self Care | Payer: Disability Insurance | Source: Ambulatory Visit | Attending: Physician Assistant | Admitting: Physician Assistant

## 2013-01-26 DIAGNOSIS — Z139 Encounter for screening, unspecified: Secondary | ICD-10-CM

## 2013-01-29 ENCOUNTER — Ambulatory Visit (INDEPENDENT_AMBULATORY_CARE_PROVIDER_SITE_OTHER): Payer: Disability Insurance | Admitting: Internal Medicine

## 2013-03-02 ENCOUNTER — Telehealth (HOSPITAL_COMMUNITY): Payer: Self-pay | Admitting: Dietician

## 2013-03-02 NOTE — Telephone Encounter (Signed)
Received call from pt at 1428. Registered for DM class on 03/17/13 at 1000. [facility]

## 2013-03-17 ENCOUNTER — Encounter (HOSPITAL_COMMUNITY): Payer: Self-pay | Admitting: Dietician

## 2013-03-17 NOTE — Progress Notes (Signed)
[facility] Diabetes Class Completion  Date:March 17, 2013  Time: 1000  Pt attended [facility] Education Class on March 17, 2013.   Patient was educated on the following topics:   -Survival skills (signs and symptoms of hyperglycemia and hypoglycemia, treatment for hypoglycemia, ideal levels for fasting and postprandial blood sugars, goal Hgb A1c level, foot care basics)  -Recommendations for physical activity   -Carbohydrate metabolism in relation to diabetes   -Meal planning (sources of carbohydrate, carbohydrate counting, meal planning strategies, food label reading, and portion control).  Handouts provided:  -"Diabetes and You: Taking Charge of Your Health"  -"Carbohydrate Counting and Meal Planning"  -"Your Guide to Better Office Visits"   Aryelle Figg A. Lanisa Ishler, RD, LDN  

## 2013-05-14 ENCOUNTER — Inpatient Hospital Stay (HOSPITAL_COMMUNITY)
Admission: EM | Admit: 2013-05-14 | Discharge: 2013-05-19 | DRG: 683 | Disposition: A | Discharge status: Home / Self Care | Payer: Medicaid Other | Attending: Internal Medicine | Admitting: Internal Medicine

## 2013-05-14 ENCOUNTER — Encounter (HOSPITAL_COMMUNITY): Payer: Self-pay | Admitting: Emergency Medicine

## 2013-05-14 DIAGNOSIS — E861 Hypovolemia: Secondary | ICD-10-CM | POA: Diagnosis present

## 2013-05-14 DIAGNOSIS — Z720 Tobacco use: Secondary | ICD-10-CM | POA: Diagnosis present

## 2013-05-14 DIAGNOSIS — K8020 Calculus of gallbladder without cholecystitis without obstruction: Secondary | ICD-10-CM | POA: Diagnosis present

## 2013-05-14 DIAGNOSIS — F411 Generalized anxiety disorder: Secondary | ICD-10-CM | POA: Diagnosis present

## 2013-05-14 DIAGNOSIS — E875 Hyperkalemia: Secondary | ICD-10-CM | POA: Diagnosis present

## 2013-05-14 DIAGNOSIS — E119 Type 2 diabetes mellitus without complications: Secondary | ICD-10-CM | POA: Diagnosis present

## 2013-05-14 DIAGNOSIS — R197 Diarrhea, unspecified: Secondary | ICD-10-CM | POA: Diagnosis present

## 2013-05-14 DIAGNOSIS — K219 Gastro-esophageal reflux disease without esophagitis: Secondary | ICD-10-CM | POA: Diagnosis present

## 2013-05-14 DIAGNOSIS — N179 Acute kidney failure, unspecified: Principal | ICD-10-CM | POA: Diagnosis present

## 2013-05-14 DIAGNOSIS — F17200 Nicotine dependence, unspecified, uncomplicated: Secondary | ICD-10-CM | POA: Diagnosis present

## 2013-05-14 DIAGNOSIS — K602 Anal fissure, unspecified: Secondary | ICD-10-CM | POA: Diagnosis present

## 2013-05-14 DIAGNOSIS — I10 Essential (primary) hypertension: Secondary | ICD-10-CM | POA: Diagnosis present

## 2013-05-14 DIAGNOSIS — E860 Dehydration: Secondary | ICD-10-CM

## 2013-05-14 DIAGNOSIS — K5732 Diverticulitis of large intestine without perforation or abscess without bleeding: Secondary | ICD-10-CM | POA: Diagnosis present

## 2013-05-14 DIAGNOSIS — Z794 Long term (current) use of insulin: Secondary | ICD-10-CM | POA: Diagnosis not present

## 2013-05-14 DIAGNOSIS — D72829 Elevated white blood cell count, unspecified: Secondary | ICD-10-CM | POA: Diagnosis present

## 2013-05-14 DIAGNOSIS — F3289 Other specified depressive episodes: Secondary | ICD-10-CM | POA: Diagnosis present

## 2013-05-14 DIAGNOSIS — Z3202 Encounter for pregnancy test, result negative: Secondary | ICD-10-CM

## 2013-05-14 DIAGNOSIS — K589 Irritable bowel syndrome without diarrhea: Secondary | ICD-10-CM | POA: Diagnosis present

## 2013-05-14 DIAGNOSIS — F329 Major depressive disorder, single episode, unspecified: Secondary | ICD-10-CM | POA: Diagnosis present

## 2013-05-14 DIAGNOSIS — E1122 Type 2 diabetes mellitus with diabetic chronic kidney disease: Secondary | ICD-10-CM | POA: Diagnosis present

## 2013-05-14 DIAGNOSIS — I959 Hypotension, unspecified: Secondary | ICD-10-CM | POA: Diagnosis present

## 2013-05-14 DIAGNOSIS — R109 Unspecified abdominal pain: Secondary | ICD-10-CM | POA: Diagnosis present

## 2013-05-14 HISTORY — DX: Anal fissure, unspecified: K60.2

## 2013-05-14 LAB — COMPREHENSIVE METABOLIC PANEL
ALK PHOS: 106 U/L (ref 39–117)
ALT: 12 U/L (ref 0–35)
AST: 12 U/L (ref 0–37)
Albumin: 3.8 g/dL (ref 3.5–5.2)
BILIRUBIN TOTAL: 0.3 mg/dL (ref 0.3–1.2)
BUN: 34 mg/dL — AB (ref 6–23)
CHLORIDE: 99 meq/L (ref 96–112)
CO2: 20 mEq/L (ref 19–32)
Calcium: 10.7 mg/dL — ABNORMAL HIGH (ref 8.4–10.5)
Creatinine, Ser: 1.75 mg/dL — ABNORMAL HIGH (ref 0.50–1.10)
GFR calc Af Amer: 38 mL/min — ABNORMAL LOW (ref >90)
GFR calc non Af Amer: 33 mL/min — ABNORMAL LOW (ref >90)
Glucose, Bld: 136 mg/dL — ABNORMAL HIGH (ref 70–99)
POTASSIUM: 6.1 meq/L — AB (ref 3.7–5.3)
SODIUM: 132 meq/L — AB (ref 137–147)
Total Protein: 7.4 g/dL (ref 6.0–8.3)

## 2013-05-14 LAB — CBC WITH DIFFERENTIAL/PLATELET
BASOS PCT: 0 % (ref 0–1)
Basophils Absolute: 0 10*3/uL (ref 0.0–0.1)
Eosinophils Absolute: 0.2 10*3/uL (ref 0.0–0.7)
Eosinophils Relative: 2 % (ref 0–5)
HCT: 39.3 % (ref 36.0–46.0)
Hemoglobin: 13.2 g/dL (ref 12.0–15.0)
LYMPHS ABS: 2.1 10*3/uL (ref 0.7–4.0)
Lymphocytes Relative: 19 % (ref 12–46)
MCH: 27.8 pg (ref 26.0–34.0)
MCHC: 33.6 g/dL (ref 30.0–36.0)
MCV: 82.9 fL (ref 78.0–100.0)
MONOS PCT: 10 % (ref 3–12)
Monocytes Absolute: 1.1 10*3/uL — ABNORMAL HIGH (ref 0.1–1.0)
NEUTROS ABS: 7.9 10*3/uL — AB (ref 1.7–7.7)
NEUTROS PCT: 69 % (ref 43–77)
Platelets: 321 10*3/uL (ref 150–400)
RBC: 4.74 MIL/uL (ref 3.87–5.11)
RDW: 13.8 % (ref 11.5–15.5)
WBC: 11.3 10*3/uL — ABNORMAL HIGH (ref 4.0–10.5)

## 2013-05-14 LAB — BASIC METABOLIC PANEL
BUN: 23 mg/dL (ref 6–23)
CALCIUM: 9.1 mg/dL (ref 8.4–10.5)
CO2: 22 meq/L (ref 19–32)
CREATININE: 1.29 mg/dL — AB (ref 0.50–1.10)
Chloride: 104 mEq/L (ref 96–112)
GFR calc Af Amer: 55 mL/min — ABNORMAL LOW (ref >90)
GFR, EST NON AFRICAN AMERICAN: 47 mL/min — AB (ref >90)
GLUCOSE: 90 mg/dL (ref 70–99)
Potassium: 5.2 mEq/L (ref 3.7–5.3)
Sodium: 136 mEq/L — ABNORMAL LOW (ref 137–147)

## 2013-05-14 LAB — GLUCOSE, CAPILLARY
Glucose-Capillary: 209 mg/dL — ABNORMAL HIGH (ref 70–99)
Glucose-Capillary: 91 mg/dL (ref 70–99)

## 2013-05-14 LAB — HEMOGLOBIN A1C
Hgb A1c MFr Bld: 6.7 % — ABNORMAL HIGH (ref <5.7)
Mean Plasma Glucose: 146 mg/dL — ABNORMAL HIGH (ref <117)

## 2013-05-14 LAB — LACTIC ACID, PLASMA: Lactic Acid, Venous: 2.3 mmol/L — ABNORMAL HIGH (ref 0.5–2.2)

## 2013-05-14 LAB — URINALYSIS, ROUTINE W REFLEX MICROSCOPIC
Bilirubin Urine: NEGATIVE
GLUCOSE, UA: NEGATIVE mg/dL
Hgb urine dipstick: NEGATIVE
KETONES UR: NEGATIVE mg/dL
Leukocytes, UA: NEGATIVE
NITRITE: NEGATIVE
Protein, ur: NEGATIVE mg/dL
Specific Gravity, Urine: >1.03 — ABNORMAL HIGH (ref 1.005–1.030)
Urobilinogen, UA: 0.2 mg/dL (ref 0.0–1.0)
pH: 5.5 (ref 5.0–8.0)

## 2013-05-14 LAB — TROPONIN I: Troponin I: <0.3 ng/mL (ref <0.30)

## 2013-05-14 LAB — PREGNANCY, URINE: Preg Test, Ur: NEGATIVE

## 2013-05-14 LAB — LIPASE, BLOOD: LIPASE: 57 U/L (ref 11–59)

## 2013-05-14 LAB — POTASSIUM: POTASSIUM: 6.4 meq/L — AB (ref 3.7–5.3)

## 2013-05-14 MED ORDER — HYDROCORTISONE ACE-PRAMOXINE 1-1 % RE FOAM
1.0000 | Freq: Two times a day (BID) | RECTAL | Status: DC
  Administered 2013-05-14 – 2013-05-15 (×3): 1 via RECTAL
  Filled 2013-05-14 (×2): qty 10

## 2013-05-14 MED ORDER — ACETAMINOPHEN 325 MG PO TABS
650.0000 mg | ORAL_TABLET | Freq: Four times a day (QID) | ORAL | Status: DC | PRN
  Administered 2013-05-15 – 2013-05-17 (×2): 650 mg via ORAL
  Filled 2013-05-14 (×2): qty 2

## 2013-05-14 MED ORDER — ACETAMINOPHEN 650 MG RE SUPP: 650.0000 mg | Freq: Four times a day (QID) | RECTAL | Status: DC | PRN

## 2013-05-14 MED ORDER — INSULIN ASPART 100 UNIT/ML IV SOLN
10.0000 [IU] | Freq: Once | INTRAVENOUS | Status: DC
  Administered 2013-05-14: 10 [IU] via INTRAVENOUS

## 2013-05-14 MED ORDER — DEXTROSE 50 % IV SOLN: 1.0000 | Freq: Once | INTRAVENOUS | Status: DC

## 2013-05-14 MED ORDER — ONDANSETRON HCL 4 MG/2ML IJ SOLN
4.0000 mg | Freq: Four times a day (QID) | INTRAMUSCULAR | Status: DC | PRN
  Administered 2013-05-18: 4 mg via INTRAVENOUS
  Filled 2013-05-14: qty 2

## 2013-05-14 MED ORDER — CITALOPRAM HYDROBROMIDE 20 MG PO TABS: 40.0000 mg | ORAL_TABLET | Freq: Every day | ORAL | Status: DC

## 2013-05-14 MED ORDER — CITALOPRAM HYDROBROMIDE 20 MG PO TABS
40.0000 mg | ORAL_TABLET | Freq: Every day | ORAL | Status: DC
  Administered 2013-05-14 – 2013-05-18 (×5): 40 mg via ORAL
  Filled 2013-05-14 (×5): qty 2

## 2013-05-14 MED ORDER — SODIUM CHLORIDE 0.9 % IJ SOLN
3.0000 mL | Freq: Two times a day (BID) | INTRAMUSCULAR | Status: DC
  Administered 2013-05-14 – 2013-05-18 (×8): 3 mL via INTRAVENOUS

## 2013-05-14 MED ORDER — HYDROCODONE-ACETAMINOPHEN 5-325 MG PO TABS
1.0000 | ORAL_TABLET | ORAL | Status: DC | PRN
  Administered 2013-05-15 – 2013-05-17 (×2): 1 via ORAL
  Filled 2013-05-14 (×2): qty 1

## 2013-05-14 MED ORDER — SODIUM CHLORIDE 0.9 % IV BOLUS (SEPSIS)
1000.0000 mL | Freq: Once | INTRAVENOUS | Status: AC
  Administered 2013-05-14: 1000 mL via INTRAVENOUS

## 2013-05-14 MED ORDER — SODIUM BICARBONATE 8.4 % IV SOLN
50.0000 meq | Freq: Once | INTRAVENOUS | Status: DC
  Administered 2013-05-14: 50 meq via INTRAVENOUS
  Filled 2013-05-14: qty 50

## 2013-05-14 MED ORDER — ONDANSETRON HCL 4 MG/2ML IJ SOLN
4.0000 mg | Freq: Once | INTRAMUSCULAR | Status: AC
  Administered 2013-05-14: 4 mg via INTRAVENOUS
  Filled 2013-05-14: qty 2

## 2013-05-14 MED ORDER — HYDROCORTISONE ACE-PRAMOXINE 1-1 % RE CREA
1.0000 "application " | TOPICAL_CREAM | Freq: Two times a day (BID) | RECTAL | Status: DC
  Filled 2013-05-14: qty 30

## 2013-05-14 MED ORDER — SODIUM POLYSTYRENE SULFONATE 15 GM/60ML PO SUSP
30.0000 g | Freq: Once | ORAL | Status: DC
  Administered 2013-05-14: 30 g via ORAL
  Filled 2013-05-14: qty 120

## 2013-05-14 MED ORDER — HEPARIN SODIUM (PORCINE) 5000 UNIT/ML IJ SOLN
5000.0000 [IU] | Freq: Three times a day (TID) | INTRAMUSCULAR | Status: DC
  Administered 2013-05-14 – 2013-05-19 (×15): 5000 [IU] via SUBCUTANEOUS
  Filled 2013-05-14 (×16): qty 1

## 2013-05-14 MED ORDER — MORPHINE SULFATE 4 MG/ML IJ SOLN: 4.0000 mg | Freq: Once | INTRAMUSCULAR | Status: DC

## 2013-05-14 MED ORDER — SODIUM CHLORIDE 0.9 % IV SOLN
INTRAVENOUS | Status: AC
  Administered 2013-05-14: 17:00:00 via INTRAVENOUS

## 2013-05-14 MED ORDER — SODIUM CHLORIDE 0.9 % IV SOLN: INTRAVENOUS | Status: DC

## 2013-05-14 MED ORDER — ONDANSETRON HCL 4 MG PO TABS
4.0000 mg | ORAL_TABLET | Freq: Four times a day (QID) | ORAL | Status: DC | PRN
  Administered 2013-05-15: 4 mg via ORAL
  Filled 2013-05-14: qty 1

## 2013-05-14 MED ORDER — DEXTROSE 50 % IV SOLN
INTRAVENOUS | Status: AC
  Administered 2013-05-14: 1
  Filled 2013-05-14: qty 50

## 2013-05-14 MED ORDER — ALUM & MAG HYDROXIDE-SIMETH 200-200-20 MG/5ML PO SUSP: 30.0000 mL | Freq: Four times a day (QID) | ORAL | Status: DC | PRN

## 2013-05-14 MED ORDER — INSULIN ASPART 100 UNIT/ML ~~LOC~~ SOLN
0.0000 [IU] | Freq: Three times a day (TID) | SUBCUTANEOUS | Status: DC
  Administered 2013-05-14: 5 [IU] via SUBCUTANEOUS
  Administered 2013-05-15: 3 [IU] via SUBCUTANEOUS
  Administered 2013-05-16 – 2013-05-17 (×2): 2 [IU] via SUBCUTANEOUS
  Administered 2013-05-19: 3 [IU] via SUBCUTANEOUS

## 2013-05-14 NOTE — ED Provider Notes (Signed)
CSN: [id number]     Arrival date & time 05/14/13  1052 History  This chart was scribed for Samuella Cota, MD by Allena Earing, ED Scribe. This patient was seen in room APA10/APA10 and the patient's care was started at 11:15 AM .     No chief complaint on file.     HPI   HPI Comments: [name] is a 53 y.o. female who presents to the Emergency Department complaining of a burning abdominal pain that has been worsening over the past 2 weeks, associated with rectal pain. Pt reports associated nausea and that anything that she consumes causes her pain. Pt reports 3 episodes of associated diarrhea yesterday and 1 today. Pt also reports associated dizziness, and rectal pain. Pt had a colonoscopy two years ago. Pt denies melena, vomiting, SOB, and fever.  Pt has DM. Pt no h/o abdominal pain. [facility] for deyhration and adominal pain. Pt mentioned several times that she was on anti-depressants  Past Medical History  Diagnosis Date  . Hypertension   . Diabetes mellitus   . Anxiety   . Depression   . GERD (gastroesophageal reflux disease)   . Seizures     none since age 18-no meds since age 23 and no seizures since ge 6  . Anal fissure    Past Surgical History  Procedure Laterality Date  . Dilation and curettage of uterus    . Back surgery  2005    lumbar disckectomy  . Foot bone excision      right  . Colonoscopy  10/01/2011    Procedure: COLONOSCOPY;  Surgeon: [name], MD;  [facility];  Service: Endoscopy;  Laterality: N/A;  730   Family History  Problem Relation Age of Onset  . Arthritis Mother   . Hernia Mother   . Constipation Mother    History  Substance Use Topics  . Smoking status: Current Every Day Smoker -- 0.50 packs/day for 20 years    Types: Cigarettes  . Smokeless tobacco: Never Used  . Alcohol Use: No   OB History   Grav Para Term Preterm Abortions TAB SAB Ect Mult Living                 Review of  Systems  Gastrointestinal: Positive for nausea, abdominal pain, diarrhea and rectal pain. Negative for vomiting and blood in stool.    A complete 10 system review of systems was obtained and all systems are negative except as noted in the HPI and PMH.     Allergies  Review of patient's allergies indicates no known allergies.  Home Medications   No current outpatient prescriptions on file. BP 84/55  Pulse 118  Temp(Src) 97.5 F (36.4 C) (Oral)  Resp 20  Ht 5\' 7"  (1.702 m)  Wt 175 lb (79.379 kg)  BMI 27.40 kg/m2  SpO2 100%  LMP 09/25/2011 Physical Exam  Nursing note and vitals reviewed. Constitutional: She is oriented to person, place, and time. She appears well-developed and well-nourished. She is active.  Non-toxic appearance. No distress.  HENT:  Head: Atraumatic.  Mouth/Throat: Oropharynx is clear and moist. No oropharyngeal exudate.  Mucosa membranes dry  Eyes: Conjunctivae and EOM are normal. Pupils are equal, round, and reactive to light.  Neck: Normal range of motion.  Cardiovascular: Regular rhythm, normal heart sounds and intact distal pulses.  Tachycardia present.   No murmur heard. Pulmonary/Chest: Effort normal and breath sounds normal. No  respiratory distress.  Abdominal: Soft. Normal appearance. There is tenderness in the epigastric area. There is no rebound and no guarding.  Tenderness in epigastrum, mild  Genitourinary: Rectal exam shows tenderness.  Small skin tears in anal area  Musculoskeletal: Normal range of motion. She exhibits no edema and no tenderness.  Neurological: She is alert and oriented to person, place, and time. She has normal reflexes. No cranial nerve deficit. She exhibits normal muscle tone. Coordination normal.  Skin: Skin is warm.    ED Course  Procedures (including critical care time) DIAGNOSTIC STUDIES: Oxygen Saturation is 100% on RA, normal by my interpretation.    COORDINATION OF CARE: 11:24 AM-Discussed treatment plan which  includes labs and an  EKG with pt at bedside and pt agreed to plan.      Labs Review Labs Reviewed  CBC WITH DIFFERENTIAL - Abnormal; Notable for the following:    WBC 11.3 (*)    Neutro Abs 7.9 (*)    Monocytes Absolute 1.1 (*)    All other components within normal limits  COMPREHENSIVE METABOLIC PANEL - Abnormal; Notable for the following:    Sodium 132 (*)    Potassium 6.1 (*)    Glucose, Bld 136 (*)    BUN 34 (*)    Creatinine, Ser 1.75 (*)    Calcium 10.7 (*)    GFR calc non Af Amer 33 (*)    GFR calc Af Amer 38 (*)    All other components within normal limits  URINALYSIS, ROUTINE W REFLEX MICROSCOPIC - Abnormal; Notable for the following:    Specific Gravity, Urine >1.030 (*)    All other components within normal limits  POTASSIUM - Abnormal; Notable for the following:    Potassium 6.4 (*)    All other components within normal limits  GLUCOSE, CAPILLARY - Abnormal; Notable for the following:    Glucose-Capillary 209 (*)    All other components within normal limits  LIPASE, BLOOD  PREGNANCY, URINE  TROPONIN I  HEMOGLOBIN A1C  GI PATHOGEN PANEL BY PCR, STOOL  BASIC METABOLIC PANEL   Imaging Review No results found.   EKG Interpretation   Date/Time:  Thursday May 14 2013 11:54:54 EDT Ventricular Rate:  93 PR Interval:  164 QRS Duration: 110 QT Interval:  340 QTC Calculation: 422 R Axis:   -48 Text Interpretation:  Normal sinus rhythm Left anterior fascicular block  Abnormal ECG No previous ECGs available No previous ECGs available  Confirmed by Issaac Shipper  MD, Zona Pedro ([id number]) on 05/14/2013 12:04:29 PM      MDM   Final diagnoses:  Acute renal failure  Hyperkalemia  Dehydration   2weeks of nausea, abdominal pain and rectal pain.  Hypotensive on arrival but nontoxic.  Abdomen soft.  No vomiting but has had 4-5 episodes of diarrhea daily. No recent travel.   Labs show acute renal failure, Cr 1.7, baseline 0.6 K 6.1  No EKG changes. Patient given  IVF BP improved to 564P systolic. No peritoneal signs on exam.  Will hold off on imaging.   Repeat K 6.4.  Will treat hyperkalemia.  Hold ACE-i  Admission d wDr. Sarajane Jews.    BP 94/60  Pulse 82  Temp(Src) 98.1 F (36.7 C) (Oral)  Resp 20  Ht 5\' 7"  (1.702 m)  Wt 174 lb 13.2 oz (79.3 kg)  BMI 27.37 kg/m2  SpO2 97%  LMP 09/25/2011    I personally performed the services described in this documentation, which was scribed in my presence. The  recorded information has been reviewed and is accurate.     Ezequiel Essex, MD 05/14/13 [phone]

## 2013-05-14 NOTE — H&P (Signed)
[facility] History and Physical  ZUMA HUST DGU:[id number] DOB: 12-17-1960 DOA: 05/14/2013  Referring physician:  PCP: Robert Bellow, MD   Chief Complaint: Rectal pain/abdominal pain/diarrhea  HPI: [name] is a 53 y.o. female with a past medical history that includes diabetes, hypertension, anal fissure, GERD, as is the emergency department with a chief complaint of worsening rectal pain with diarrhea. She states she has had rectal pain for the last 2 weeks and [facility] He was given cream that she has been using without much relief. Today she went to her primary care provider for followup examination and it was discovered that she was hypotensive and dehydrated. She was sent to the emergency room for further evaluation. Basic metabolic panel yields sodium 132 potassium 6.1 creatinine 1.7 BUN 34. Platelet count yields mild leukocytosis. Vital signs significant for blood pressure of 84/55 and a heart rate of 118. She is afebrile and not hypoxic.  She states that associated symptoms include nausea with no vomiting, diarrhea and decreased appetite. She indicates she has not been taking much by mouth every last 2 days. She states that she has had 2-3 loose stools today the most recent was in emergency room. He describes them as light brown and watery. She denies bright red blood per rectum or melena. She denies any recent antibiotic use. She denies dysuria hematuria frequency or urgency. He indicates he has been taking all her home medications which include lisinopril. She denies headache dizziness syncope or near syncope. He denies chest pain palpitations shortness of breath. She does have a chronic moist nonproductive cough she is a smoker. Of note chart review indicates she had a colonoscopy and 2013 with 2 small adenomas removed scattered diverticula throughout the colon. He also had small external hemorrhoids and an anal fissure. Her  followup appointment in November of 2013 indicated that the fissure was healed.  In the emergency room she received 2 L of normal saline intravenously, insulin 10 units intravenously with followed by 1 amp of D50 and Kayexalate.  Review of Systems:  10 point review of systems complete all systems are negative except as indicated in the history of present illness Past Medical History  Diagnosis Date  . Hypertension   . Diabetes mellitus   . Anxiety   . Depression   . GERD (gastroesophageal reflux disease)   . Seizures     none since age 30-no meds since age 89 and no seizures since ge 6  . Anal fissure    Past Surgical History  Procedure Laterality Date  . Dilation and curettage of uterus    . Back surgery  2005    lumbar disckectomy  . Foot bone excision      right  . Colonoscopy  10/01/2011    Procedure: COLONOSCOPY;  Surgeon: [name], MD;  Location: [facility];  Service: Endoscopy;  Laterality: N/A;  730   Social History:  reports that she has been smoking Cigarettes.  She has a 10 pack-year smoking history. She has never used smokeless tobacco. She reports that she does not drink alcohol or use illicit drugs.  No Known Allergies  Family History  Problem Relation Age of Onset  . Arthritis Mother   . Hernia Mother   . Constipation Mother      Prior to Admission medications   Medication Sig Start Date End Date Taking? Authorizing Provider  aspirin 325 MG tablet Take 325 mg by  mouth daily.   Yes Historical Provider, MD  citalopram (CELEXA) 40 MG tablet Take 40 mg by mouth daily.   Yes Historical Provider, MD  lisinopril (PRINIVIL,ZESTRIL) 10 MG tablet Take 10 mg by mouth daily.   Yes Historical Provider, MD  metFORMIN (GLUCOPHAGE) 1000 MG tablet Take 1,000 mg by mouth 2 (two) times daily.   Yes Historical Provider, MD  niacin-simvastatin (SIMCOR) 500-20 MG 24 hr tablet Take 1 tablet by mouth at bedtime.   Yes Historical Provider, MD  pramoxine-hydrocortisone  (PROCTOCREAM-HC) 1-1 % rectal cream Place 1 application rectally 2 (two) times daily.   Yes Historical Provider, MD   Physical Exam: Filed Vitals:   05/14/13 1600  BP: 99/58  Pulse: 83  Temp:   Resp:     BP 99/58  Pulse 83  Temp(Src) 97.5 F (36.4 C) (Oral)  Resp 20  Ht $R'5\' 7"'Wh$  (1.702 m)  Wt 79.379 kg (175 lb)  BMI 27.40 kg/m2  SpO2 97%  LMP 09/25/2011  General:  Appears calm and comfortable Eyes: PERRL, normal lids, irises & conjunctiva ENT: grossly normal hearing, lips & tongue because membranes of her mouth somewhat dry Neck: no LAD, masses or thyromegaly Cardiovascular: Tachycardic but regular no murmur no gallop no lower extremity edema Telemetry: SR, no arrhythmias  Respiratory: Normal effort. Breath sounds somewhat coarse. I hear no wheezing no crackle Abdomen: soft, nondistended positive bowel sounds but somewhat sluggish mild tenderness in the epigastric area otherwise nontender Skin: no rash or induration seen on limited exam Musculoskeletal: grossly normal tone BUE/BLE Psychiatric: grossly normal mood and affect, speech fluent and appropriate Neurologic: grossly non-focal.          Labs on Admission:  Basic Metabolic Panel:  Recent Labs Lab 05/14/13 1140 05/14/13 1333  NA 132*  --   K 6.1* 6.4*  CL 99  --   CO2 20  --   GLUCOSE 136*  --   BUN 34*  --   CREATININE 1.75*  --   CALCIUM 10.7*  --    Liver Function Tests:  Recent Labs Lab 05/14/13 1140  AST 12  ALT 12  ALKPHOS 106  BILITOT 0.3  PROT 7.4  ALBUMIN 3.8    Recent Labs Lab 05/14/13 1140  LIPASE 57   No results found for this basename: AMMONIA,  in the last 168 hours CBC:  Recent Labs Lab 05/14/13 1140  WBC 11.3*  NEUTROABS 7.9*  HGB 13.2  HCT 39.3  MCV 82.9  PLT 321   Cardiac Enzymes:  Recent Labs Lab 05/14/13 1140  TROPONINI <0.30    BNP (last 3 results) No results found for this basename: PROBNP,  in the last 8760 hours CBG: No results found for this  basename: GLUCAP,  in the last 168 hours  Radiological Exams on Admission: No results found.  EKG: Independently reviewed. Sinus tach  Assessment/Plan Principal Problem:   Acute renal failure: Likely related to dehydration and hypovolemia secondary to diarrhea and decreased by mouth intake. Will admit to telemetry. Will hold any nephrotoxins. Will gently hydrate with IV fluids. We will monitor intake and output. If no improvement tomorrow will consider a renal ultrasound.  Active Problems:   Hypotension: Likely related to decreased by mouth intake hypovolemia and taking her lisinopril. She received 2 L of normal saline in the emergency department and her blood pressure is much improved. Continue with gentle IV hydration. Will hold her lisinopril. We will monitor her blood pressure every 4 hours x3.  Hyperkalemia: Related  to #1. In the emergency room she got 10 units of insulin intravenously with one amp of D50 and 1 AM of sodium bicarbonate. She also received Kayexalate. Will recheck her they met this evening.    DM (diabetes mellitus): She takes glipizide at home. We will hold this for now.  Obtain hemoglobin A1c. Use sliding scale insulin for optimal control  Diarrhea. Etiology uncertain. We will obtain a GI pathogen panel. Expect a few more episodes of diarrhea given she got Kayexalate in the emergency department.  Abdominal pain. Etiology unclear. I cleared a gastritis given her nausea with diarrhea. Exam benign. Will hold off on any imaging for now.    HTN (hypertension): See #2.    Anal fissure: History of same. We will continue her home medication. Provide pain medicine as needed       Tobacco abuse: Cessation counseling provided     Code Status: full Family Communication: none present Disposition Plan: home when ready  Time spent: 60 minutes  [facility]

## 2013-05-14 NOTE — ED Notes (Signed)
abd pain, nausea, no vomiting, rectal pain, Pt came from [facility] and told that her bp was low and dehydrated.  No vomiting, "my bowels are loose"- 1  Loose bm today.  Taking a stool softner

## 2013-05-14 NOTE — Care Management Note (Signed)
UR completed 

## 2013-05-14 NOTE — Care Management Note (Signed)
ED/CM noted patient did not have health insurance and/or PCP listed in the computer.  Patient was given the [place] resource handout with information on the clinics, food pantries, and the handout for new health insurance sign-up.  Patient expressed appreciation for information received. 

## 2013-05-14 NOTE — H&P (Signed)
Patient seen, independently examined and chart reviewed. I agree with exam, assessment and plan discussed with Dyanne Carrel, NP.  53 year old woman with two-week history of profuse diarrhea [facility] when she was noted to be hypotensive and referred to the emergency department. She has had some nausea but no vomiting. Oral intake has been poor. She reports dyspepsia ongoing.   She was noted to be hypotensive in the emergency department which responded to IV fluids. Abdominal exam was benign she was referred for dehydration and further evaluation.  PMH diabetes mellitus, hypertension  Objective: Afebrile, vital signs are stable. Initially was hypotensive with systolic blood pressure in the 80s. No hypoxia. Appears calm and comfortable. Speech fluent and clear. Eyes pupils round and equal. Lids and irises appear normal. ENT grossly unremarkable. Cardiovascular regular rate and rhythm. No murmur, rub or gallop. No lower extremity edema. Respiratory clear to auscultation bilaterally. No wheezes, rales or rhonchi. Normal respiratory effort. Abdomen soft nontender nondistended. Skin appears grossly unremarkable. Musculoskeletal grossly unremarkable.  BUN 34, creatinine 1.75 elevated over baseline. Potassium 6.1. Troponin negative. Lactic acid minimally elevated 2.3. CBC with modest leukocytosis 11.3. Urine pregnancy test negative. Urinalysis unremarkable. EKG NSR no acute changes.   She appears clinically stable. Plan to admit for acute renal failure with hyperkalemia secondary to prolonged diarrheal illness and poor oral intake. Hyperkalemia likely exacerbated by lisinopril superimposed on renal failure. She has no acute EKG changes. Plan to monitor on telemetry, check basic metabolic panel tonight. She has already been treated with Kayexalate and other treatments for hyperkalemia. Hypotension has improved already with IV fluids. Plan to continue fluids. Her abdomen is benign. No need for  imaging at this point.  Murray Hodgkins, MD [facility] [phone]

## 2013-05-15 DIAGNOSIS — E860 Dehydration: Secondary | ICD-10-CM

## 2013-05-15 DIAGNOSIS — K602 Anal fissure, unspecified: Secondary | ICD-10-CM

## 2013-05-15 DIAGNOSIS — R197 Diarrhea, unspecified: Secondary | ICD-10-CM

## 2013-05-15 DIAGNOSIS — E875 Hyperkalemia: Secondary | ICD-10-CM

## 2013-05-15 DIAGNOSIS — K5732 Diverticulitis of large intestine without perforation or abscess without bleeding: Secondary | ICD-10-CM

## 2013-05-15 DIAGNOSIS — K589 Irritable bowel syndrome without diarrhea: Secondary | ICD-10-CM

## 2013-05-15 DIAGNOSIS — N179 Acute kidney failure, unspecified: Principal | ICD-10-CM

## 2013-05-15 LAB — BASIC METABOLIC PANEL
BUN: 17 mg/dL (ref 6–23)
CHLORIDE: 105 meq/L (ref 96–112)
CO2: 20 meq/L (ref 19–32)
CREATININE: 1.21 mg/dL — AB (ref 0.50–1.10)
Calcium: 9.1 mg/dL (ref 8.4–10.5)
GFR calc Af Amer: 59 mL/min — ABNORMAL LOW (ref >90)
GFR calc non Af Amer: 51 mL/min — ABNORMAL LOW (ref >90)
Glucose, Bld: 102 mg/dL — ABNORMAL HIGH (ref 70–99)
POTASSIUM: 5.2 meq/L (ref 3.7–5.3)
Sodium: 136 mEq/L — ABNORMAL LOW (ref 137–147)

## 2013-05-15 LAB — GLUCOSE, CAPILLARY
GLUCOSE-CAPILLARY: 139 mg/dL — AB (ref 70–99)
Glucose-Capillary: 112 mg/dL — ABNORMAL HIGH (ref 70–99)
Glucose-Capillary: 99 mg/dL (ref 70–99)
Glucose-Capillary: 95 mg/dL (ref 70–99)

## 2013-05-15 LAB — GI PATHOGEN PANEL BY PCR, STOOL
C DIFFICILE TOXIN A/B: NEGATIVE
CAMPYLOBACTER BY PCR: NEGATIVE
Cryptosporidium by PCR: NEGATIVE
E COLI (STEC): NEGATIVE
E COLI 0157 BY PCR: NEGATIVE
E coli (ETEC) LT/ST: NEGATIVE
G lamblia by PCR: NEGATIVE
Norovirus GI/GII: NEGATIVE
Rotavirus A by PCR: NEGATIVE
SALMONELLA BY PCR: NEGATIVE
Shigella by PCR: NEGATIVE

## 2013-05-15 LAB — CBC
HCT: 32.4 % — ABNORMAL LOW (ref 36.0–46.0)
Hemoglobin: 11 g/dL — ABNORMAL LOW (ref 12.0–15.0)
MCH: 28.1 pg (ref 26.0–34.0)
MCHC: 34 g/dL (ref 30.0–36.0)
MCV: 82.7 fL (ref 78.0–100.0)
Platelets: 255 10*3/uL (ref 150–400)
RBC: 3.92 MIL/uL (ref 3.87–5.11)
RDW: 14 % (ref 11.5–15.5)
WBC: 7.5 10*3/uL (ref 4.0–10.5)

## 2013-05-15 LAB — CLOSTRIDIUM DIFFICILE BY PCR: CDIFFPCR: NEGATIVE

## 2013-05-15 MED ORDER — SODIUM CHLORIDE 0.9 % IV BOLUS (SEPSIS)
500.0000 mL | Freq: Once | INTRAVENOUS | Status: AC
  Administered 2013-05-15: 500 mL via INTRAVENOUS

## 2013-05-15 MED ORDER — PANTOPRAZOLE SODIUM 40 MG PO TBEC
40.0000 mg | DELAYED_RELEASE_TABLET | Freq: Every day | ORAL | Status: DC
Start: 2013-05-15 — End: 2013-05-19
  Administered 2013-05-15 – 2013-05-19 (×5): 40 mg via ORAL
  Filled 2013-05-15 (×6): qty 1

## 2013-05-15 MED ORDER — SODIUM CHLORIDE 0.9 % IV SOLN
INTRAVENOUS | Status: AC
  Administered 2013-05-15 (×2): via INTRAVENOUS

## 2013-05-15 MED ORDER — TRAZODONE HCL 50 MG PO TABS
50.0000 mg | ORAL_TABLET | Freq: Every evening | ORAL | Status: DC | PRN
  Administered 2013-05-15: 50 mg via ORAL
  Filled 2013-05-15: qty 1

## 2013-05-15 NOTE — Progress Notes (Signed)
[facility] PROGRESS NOTE  NATE PERRI YKD:[id number] DOB: 01-14-1961 DOA: 05/14/2013 PCP: Robert Bellow, MD  Assessment/Plan: Principal Problem:  Acute renal failure: Likely related to dehydration and hypovolemia secondary to diarrhea and decreased by mouth intake. Creatinine trending down. Urine output good.  Will continue to  hold any nephrotoxins. Will continue  with IV fluids.   Active Problems:  Hypotension: Improved but somewhat soft. Likely related to decreased by mouth intake hypovolemia and taking her lisinopril. She received 2 L of normal saline in the emergency department.. Continue with IV hydration. Will hold her lisinopril. Orthostatic VS pending.   Hyperkalemia: resolved this am.  Related to #1. In the emergency room she got 10 units of insulin intravenously with one amp of D50 and 1 AM of sodium bicarbonate. She also received Kayexalate. Will monitor   DM (diabetes mellitus): She takes glipizide at home. We will hold this for now. Hemoglobin A1c 6.7. CBG range 91-112. Use sliding scale insulin for optimal control   Diarrhea. Etiology uncertain. We will obtain a GI pathogen panel in process. Continues with loose stools.  Expected a few more episodes of diarrhea given she got Kayexalate in the emergency department. Will monitor  Abdominal pain. Somewhat improved. Worsens with eating but she is requesting food.  Etiology unclear.  Likely gastritis given her nausea with diarrhea. Exam benign. Will hold off on any imaging for now. Request GI for dyspepsia.   HTN (hypertension): See #2.   Anal fissure: History of same. We will continue her home medication. Provide pain medicine as needed   Tobacco abuse: Cessation counseling provided   Code Status: full Family Communication: none present Disposition Plan: home when ready   Consultants:  GI  Procedures:  none  Antibiotics:  HPI/Subjective: Sitting up in bed finishing clear liquid breakfast. Complains  persistent nausea, no vomiting with abdominal pain that is a little better than yesterday  Objective: Filed Vitals:   05/15/13 0358  BP: 94/50  Pulse: 86  Temp: 98.5 F (36.9 C)  Resp: 20    Intake/Output Summary (Last 24 hours) at 05/15/13 0940 Last data filed at 05/15/13 0400  Gross per 24 hour  Intake      0 ml  Output   1000 ml  Net  -1000 ml   Filed Weights   05/14/13 1100 05/14/13 1645 05/14/13 1819  Weight: 79.379 kg (175 lb) 79.3 kg (174 lb 13.2 oz) 78.926 kg (174 lb)    Exam:   General:  Well nourished, calm appears comfortable  Cardiovascular: RRR No MGR No LE edema  Respiratory: normal effort BS clear bilaterally no wheeze no rhonchi  Abdomen: round but soft, mild tenderness epigastric area, sluggish BS   Musculoskeletal: no clubbing or cyanosis   Data Reviewed: Basic Metabolic Panel:  Recent Labs Lab 05/14/13 1140 05/14/13 1333 05/14/13 2150 05/15/13 0545  NA 132*  --  136* 136*  K 6.1* 6.4* 5.2 5.2  CL 99  --  104 105  CO2 20  --  22 20  GLUCOSE 136*  --  90 102*  BUN 34*  --  23 17  CREATININE 1.75*  --  1.29* 1.21*  CALCIUM 10.7*  --  9.1 9.1   Liver Function Tests:  Recent Labs Lab 05/14/13 1140  AST 12  ALT 12  ALKPHOS 106  BILITOT 0.3  PROT 7.4  ALBUMIN 3.8    Recent Labs Lab 05/14/13 1140  LIPASE 57   No results found for this basename: AMMONIA,  in the last 168 hours CBC:  Recent Labs Lab 05/14/13 1140 05/15/13 0545  WBC 11.3* 7.5  NEUTROABS 7.9*  --   HGB 13.2 11.0*  HCT 39.3 32.4*  MCV 82.9 82.7  PLT 321 255   Cardiac Enzymes:  Recent Labs Lab 05/14/13 1140  TROPONINI <0.30   BNP (last 3 results) No results found for this basename: PROBNP,  in the last 8760 hours CBG:  Recent Labs Lab 05/14/13 1643 05/14/13 2122 05/15/13 0724  GLUCAP 209* 91 112*    No results found for this or any previous visit (from the past 240 hour(s)).   Studies: No results found.  Scheduled Meds: . citalopram   40 mg Oral Daily  . heparin  5,000 Units Subcutaneous 3 times per day  . hydrocortisone-pramoxine  1 applicator Rectal BID  . insulin aspart  0-15 Units Subcutaneous TID WC  . sodium chloride  3 mL Intravenous Q12H   Continuous Infusions: . sodium chloride      Principal Problem:   Acute renal failure Active Problems:   DM (diabetes mellitus)   HTN (hypertension)   Diarrhea   Anal fissure   Hyperkalemia   Hypotension   Tobacco abuse    Time spent: 35 minutes    [facility] Pager [phone]. If 7PM-7AM, please contact night-coverage at [email or web address], password [facility] 05/15/2013, 9:40 AM  LOS: 1 day

## 2013-05-15 NOTE — Consult Note (Signed)
Referring Provider: No ref. provider found Primary Care Physician:  Robert Bellow, MD Primary Gastroenterologist:  Dr. Laural Golden  Reason for Consultation:  Diarrhea and anal pain.  HPI:   Patient is 53 year old Caucasian female who was admitted to Dr. Sarajane Jews service yesterday via emergency room where she presented with 3 week history of diarrhea and severe anal and perianal pain. She has history of diarrhea and was last evaluated in August 2013. Sigmoid colon biopsies are negative for microscopic or collagenocolitis. I felt she had irritable bowel syndrome but recommended stopping metformin. She has stayed on metformin; in fact does was doubled a few months ago. Patient recalls that 3 weeks ago she became constipated and had to strain and she developed severe anal pain like she had before. She took OTC laxatives and since then she has had diarrhea but 3-4 stools per day. Every time she would have a bowel movement she would have excruciating pain and therefore she decided to cut back on by mouth intake. [facility] and noted to be dehydrated and therefore sent to ER for evaluation and subsequently hospitalized. Today she feels better. She hasn't had a BM today. She has noted abdominal cramps prior to her bowel movements. She denies melena or frank rectal bleeding. She has noted blood on the tissue she believes is due to irritation from diarrhea. She says she may have lost a few pounds in the last 3 weeks. She denies fever chills or vomiting. She has had nausea and frequent heartburn. She was on PPI before but when she lost Medicaid she has not been able to afford this medication. She denies dysphagia. There is no history of recent antibiotic use. Patient remains under a lot of stress because of financial difficulties. She is having a difficult time to care for utilities etc.    Past Medical History  Diagnosis Date  . Hypertension   . Diabetes mellitus   . Anxiety   .  Depression   . GERD (gastroesophageal reflux disease)   . Seizures     none since age 28-no meds since age 19 and no seizures since ge 6  . Anal fissure     Past Surgical History  Procedure Laterality Date  . Dilation and curettage of uterus    . Back surgery  2005    lumbar disckectomy  . Foot bone excision      right  . Colonoscopy  10/01/2011    Procedure: COLONOSCOPY;  Surgeon: [name], MD;  Location: [facility];  Service: Endoscopy;  Laterality: N/A;  730    Prior to Admission medications   Medication Sig Start Date End Date Taking? Authorizing Provider  aspirin 325 MG tablet Take 325 mg by mouth daily.   Yes Historical Provider, MD  citalopram (CELEXA) 40 MG tablet Take 40 mg by mouth daily.   Yes Historical Provider, MD  lisinopril (PRINIVIL,ZESTRIL) 10 MG tablet Take 10 mg by mouth daily.   Yes Historical Provider, MD  metFORMIN (GLUCOPHAGE) 1000 MG tablet Take 1,000 mg by mouth 2 (two) times daily.   Yes Historical Provider, MD  niacin-simvastatin (SIMCOR) 500-20 MG 24 hr tablet Take 1 tablet by mouth at bedtime.   Yes Historical Provider, MD  pramoxine-hydrocortisone (PROCTOCREAM-HC) 1-1 % rectal cream Place 1 application rectally 2 (two) times daily.   Yes Historical Provider, MD    Current Facility-Administered Medications  Medication Dose Route Frequency Provider Last Rate Last Dose  . 0.9 %  sodium  chloride infusion   Intravenous Continuous Radene Gunning, NP 100 mL/hr at 05/15/13 1108    . acetaminophen (TYLENOL) tablet 650 mg  650 mg Oral Q6H PRN Radene Gunning, NP   650 mg at 05/15/13 0915   Or  . acetaminophen (TYLENOL) suppository 650 mg  650 mg Rectal Q6H PRN Radene Gunning, NP      . alum & mag hydroxide-simeth (MAALOX/MYLANTA) 200-200-20 MG/5ML suspension 30 mL  30 mL Oral Q6H PRN Radene Gunning, NP      . citalopram (CELEXA) tablet 40 mg  40 mg Oral Daily Samuella Cota, MD   40 mg at 05/14/13 2300  . heparin injection 5,000 Units  5,000 Units  Subcutaneous 3 times per day Radene Gunning, NP   5,000 Units at 05/15/13 0608  . HYDROcodone-acetaminophen (NORCO/VICODIN) 5-325 MG per tablet 1-2 tablet  1-2 tablet Oral Q4H PRN Radene Gunning, NP   1 tablet at 05/15/13 0645  . hydrocortisone-pramoxine (PROCTOFOAM-HC) rectal foam 1 applicator  1 applicator Rectal BID Samuella Cota, MD   1 applicator at 82/50/03 7048  . insulin aspart (novoLOG) injection 0-15 Units  0-15 Units Subcutaneous TID WC Radene Gunning, NP   3 Units at 05/15/13 1257  . ondansetron (ZOFRAN) tablet 4 mg  4 mg Oral Q6H PRN Radene Gunning, NP   4 mg at 05/15/13 0645   Or  . ondansetron [facility]) injection 4 mg  4 mg Intravenous Q6H PRN Lezlie Octave Black, NP      . sodium chloride 0.9 % injection 3 mL  3 mL Intravenous Q12H Radene Gunning, NP   3 mL at 05/14/13 2300  . traZODone (DESYREL) tablet 50 mg  50 mg Oral QHS PRN Samuella Cota, MD        Allergies as of 05/14/2013  . (No Known Allergies)    Family History  Problem Relation Age of Onset  . Arthritis Mother   . Hernia Mother   . Constipation Mother     History   Social History  . Marital Status: Divorced    Spouse Name: N/A    Number of Children: N/A  . Years of Education: N/A   Occupational History  . Not on file.   Social History Main Topics  . Smoking status: Current Every Day Smoker -- 0.50 packs/day for 20 years    Types: Cigarettes  . Smokeless tobacco: Never Used  . Alcohol Use: No  . Drug Use: No  . Sexual Activity: Yes    Birth Control/ Protection: None   Other Topics Concern  . Not on file   Social History Narrative  . No narrative on file    Review of Systems: See HPI, otherwise normal ROS  Physical Exam: Temp:  [97.4 F (36.3 C)-98.7 F (37.1 C)] 98.4 F (36.9 C) (03/20 1338) Pulse Rate:  [79-87] 79 (03/20 1338) Resp:  [16-20] 20 (03/20 1338) BP: (82-103)/(41-65) 96/61 mmHg (03/20 1338) SpO2:  [95 %-100 %] 96 % (03/20 1338) Weight:  [174 lb (78.926 kg)-174 lb 13.2 oz  (79.3 kg)] 174 lb (78.926 kg) (03/19 1819) Last BM Date: 05/15/13 Patient is alert and in no acute distress. Conjunctival is pink. Sclerae nonicteric. Oropharyngeal mucosa is normal. No neck masses or thyromegaly noted. Cardiac exam with a regular rhythm normal S1 and S2. No murmur or gallop noted. Lungs are clear to auscultation. Abdomen is full. Bowel sounds are normal. On palpation abdomen is soft with mild tenderness  at left mid abdomen. No organomegaly or masses. Rectal examination deferred as as she had one on admission. No clubbing or peripheral edema noted. Intake/Output from previous day.  03/19 0701 - 03/20 0700 In: -  Out: 1000 [Urine:1000] Intake/Output this shift: Total I/O In: 264 [P.O.:264] Out: 200 [Urine:200]  Lab Results:  Recent Labs  05/14/13 1140 05/15/13 0545  WBC 11.3* 7.5  HGB 13.2 11.0*  HCT 39.3 32.4*  PLT 321 255   BMET  Recent Labs  05/14/13 1140 05/14/13 1333 05/14/13 2150 05/15/13 0545  NA 132*  --  136* 136*  K 6.1* 6.4* 5.2 5.2  CL 99  --  104 105  CO2 20  --  22 20  GLUCOSE 136*  --  90 102*  BUN 34*  --  23 17  CREATININE 1.75*  --  1.29* 1.21*  CALCIUM 10.7*  --  9.1 9.1   LFT  Recent Labs  05/14/13 1140  PROT 7.4  ALBUMIN 3.8  AST 12  ALT 12  ALKPHOS 106  BILITOT 0.3   PT/INR No results found for this basename: LABPROT, INR,  in the last 72 hours Hepatitis Panel No results found for this basename: HEPBSAG, HCVAB, HEPAIGM, HEPBIGM,  in the last 72 hours  Studies/Results: No results found.  Assessment; Patient is 53 year old Caucasian female with diarrhea diminished oral intake and evidence of dehydration and azotemia. Stool studies are pending and she is improving with IV fluids. She has history of diarrhea felt to be secondary to irritable bowel syndrome. She underwent colonoscopy in August 2013 with removal of two small tubular adenomas and biopsy from sigmoid colon was negative for microscopic or collagenous  colitis. Three weeks ago she was constipated and took OTC laxatives. As far as  acute diarrhea is concerned it may be an infection but chronic diarrhea is most likely secondary to irritable bowel syndrome or due to metformin. I had recommended that she should be switched to another agent year before last but it did not materialize. 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 She is doing better with topical steroid cream. She may also need topical calcium channel blocker. Chronic GERD. Patient is to be back on PPI.  Recommendations; Advance diet to full liquid. Pantoprazole 40 mg by mouth every morning. Await results of stool studies. Celiac antibody panel.   LOS: 1 day   Anaisha Mago U  05/15/2013, 4:23 PM

## 2013-05-15 NOTE — Care Management Note (Addendum)
    Page 1 of 1   05/19/2013     3:50:58 PM   CARE MANAGEMENT NOTE 05/19/2013  Patient:  [name], [name]   Account Number:  [id number]  Date Initiated:  05/15/2013  Documentation initiated by:  Claretha Cooper  Subjective/Objective Assessment:   Pt admitted from home where she lives with her daughter. Goes to the [facility] for PCP and very pleased with this arrangement.     Action/Plan:   Anticipated DC Date:  05/16/2013   Anticipated DC Plan:  [place]  CM consult  [place] [facility]      Choice offered to / List presented to:             Status of service:  Completed, signed off Medicare Important Message given?   (If response is "NO", the following Medicare IM given date fields will be blank) Date Medicare IM given:   Date Additional Medicare IM given:    Discharge Disposition:    Per UR Regulation:    If discussed at Long Length of Stay Meetings, dates discussed:    Comments:  05/19/13 Claretha Cooper RN BSN CM Pt given a MATCH voucher and program rules explained.  05/18/13 Margaruite Top Dellia Nims RN BSN CM Pt states she is back on IV AB and bumped diet back to clear liquids. Advance diet tomorrow per pt.  05/15/13 Claretha Cooper RN BSN CM No HH needs anticipated.

## 2013-05-15 NOTE — Progress Notes (Signed)
Patient seen, independently examined and chart reviewed. I agree with exam, assessment and plan discussed with Dyanne Carrel, NP.  Subjective: She is feeling somewhat better today. No vomiting he still has some dyspepsia. Diarrhea continues.  Objective: Afebrile, vital signs are stable. No hypoxia. Appears calm and comfortable. Speech fluent and clear. Cardiovascular regular rate and rhythm. Respiratory clear to auscultation bilaterally. No wheezes, rales or rhonchi. Normal respiratory effort. Abdomen soft and nondistended, nontender. Psychiatric grossly normal mood and affect. Speech fluent and appropriate.  Potassium now 5.2. Creatinine are normal and continues to trend downwards. Hemoglobin slightly lower, likely dilutional.  Overall improving with resolving renal failure secondary to dehydration and volume loss. Plan to continue IV fluids at this point, followup GI pathogen panel for diarrheal illness. Borderline low blood pressure is asymptomatic and overall improved. Hyperkalemia has resolved at this point. Check basic metabolic panel the morning. GI consultation.  Murray Hodgkins, MD [facility] [phone]

## 2013-05-15 NOTE — Progress Notes (Signed)
Repeat K was 5.2. Paged mid level as ordered.

## 2013-05-16 DIAGNOSIS — E119 Type 2 diabetes mellitus without complications: Secondary | ICD-10-CM

## 2013-05-16 LAB — GLUCOSE, CAPILLARY
GLUCOSE-CAPILLARY: 158 mg/dL — AB (ref 70–99)
Glucose-Capillary: 104 mg/dL — ABNORMAL HIGH (ref 70–99)
Glucose-Capillary: 112 mg/dL — ABNORMAL HIGH (ref 70–99)
Glucose-Capillary: 140 mg/dL — ABNORMAL HIGH (ref 70–99)

## 2013-05-16 LAB — BASIC METABOLIC PANEL
BUN: 9 mg/dL (ref 6–23)
CALCIUM: 9.2 mg/dL (ref 8.4–10.5)
CHLORIDE: 106 meq/L (ref 96–112)
CO2: 21 meq/L (ref 19–32)
CREATININE: 1.1 mg/dL (ref 0.50–1.10)
GFR calc non Af Amer: 57 mL/min — ABNORMAL LOW (ref >90)
GFR, EST AFRICAN AMERICAN: 66 mL/min — AB (ref >90)
Glucose, Bld: 99 mg/dL (ref 70–99)
Potassium: 4.7 mEq/L (ref 3.7–5.3)
Sodium: 137 mEq/L (ref 137–147)

## 2013-05-16 MED ORDER — HYDROCORTISONE ACE-PRAMOXINE 2.5-1 % RE CREA
TOPICAL_CREAM | Freq: Two times a day (BID) | RECTAL | Status: DC
  Administered 2013-05-16: 21:00:00 via RECTAL
  Administered 2013-05-16 – 2013-05-17 (×2): 1 via RECTAL
  Administered 2013-05-17 – 2013-05-18 (×3): via RECTAL
  Filled 2013-05-16: qty 30

## 2013-05-16 MED ORDER — DICYCLOMINE HCL 10 MG PO CAPS
10.0000 mg | ORAL_CAPSULE | Freq: Three times a day (TID) | ORAL | Status: DC
  Administered 2013-05-16 – 2013-05-19 (×10): 10 mg via ORAL
  Filled 2013-05-16 (×10): qty 1

## 2013-05-16 MED ORDER — GUAIFENESIN ER 600 MG PO TB12
600.0000 mg | ORAL_TABLET | Freq: Two times a day (BID) | ORAL | Status: DC
  Administered 2013-05-16 – 2013-05-19 (×6): 600 mg via ORAL
  Filled 2013-05-16 (×6): qty 1

## 2013-05-16 MED ORDER — ZOLPIDEM TARTRATE 5 MG PO TABS
5.0000 mg | ORAL_TABLET | Freq: Every evening | ORAL | Status: DC | PRN
  Administered 2013-05-16 – 2013-05-17 (×2): 5 mg via ORAL
  Filled 2013-05-16 (×2): qty 1

## 2013-05-16 NOTE — Progress Notes (Signed)
  Subjective:  Patient states she does not feel well. She states her "stomach is  tore up". She had soft stool last night but had 2 loose stools this morning. Anorectal pain has eased. She denies melena or rectal bleeding. She also denies nausea or vomiting. She states medication that she received to help her sleep made her mind race for a while before she could sleep. She does not believe her symptoms are aggravated with any products.   Objective: Blood pressure 104/89, pulse 102, temperature 97.3 F (36.3 C), temperature source Oral, resp. rate 20, height 5\' 7"  (1.702 m), weight 174 lb (78.926 kg), last menstrual period 09/25/2011, SpO2 99.00%. Patient is alert and appears to be comfortable. Abdomen is full. Bowel sounds are normal. Abdomen is soft with mild tenderness and LLQ. No organomegaly or masses.  No LE edema  noted.  Labs/studies Results:  GI pathogen panel negative. C. difficile by PCR negative Serum sodium 137, potassium 4.7, right 106, CO2 21,  BUN 9 and creatinine 1.10. Glucose 99. Celiac antibody panel is negative.  Assessment:  #1. Diarrhea. Stool C. difficile by PCR and GI pathogen panel are negative. She had colonoscopy about 18 months ago and biopsy was negative for microscopic or collagenous colitis. Major reason for diarrhea appears to be irritable bowel syndrome. Stress may be playing a role in her symptoms. She is also being screened for celiac disease. #2. Renal dysfunction. Renal function has improved with hydration. Serum creatinine is now normal. #3. GERD. She is back on PPI.  Recommendations;  Advance diet. Dicyclomine 10 mg po ac. CBC and metabolic 7 in a.m.

## 2013-05-16 NOTE — Progress Notes (Signed)
  PROGRESS NOTE  [name]:[id number] DOB: [birth year] DOA: 05/14/2013 PCP: No primary provider on file.  Summary: 53 year old woman with two-week history of profuse diarrhea [facility] when she was noted to be hypotensive and referred to the emergency department. She has had some nausea but no vomiting. Oral intake has been poor. She reports dyspepsia ongoing.   Assessment/Plan: 1. Ongoing diarrhea for the last 2 weeks. GI pathogen panel and C. difficile PCR negative. Colonoscopy in the past negative for microscopic or collagenous colitis. Celiac screen pending. Suspected irritable bowel syndrome. GI also takes metformin likely to be playing a role, alternative therapy recommended on discharge. 2. Acute renal failure. Resolved secondary to profuse diarrhea complicated by lisinopril. 3. Hyperkalemia. Resolved secondary to dehydration and acute renal failure. 4. GERD. PPI therapy. 5. Diabetes mellitus. Stable. Continue sliding scale insulin.   Dicyclomine per GI. Advance diet. Discussed with Dr. Laural Golden.  May be able to be discharged home 3/22.  Code Status: full code DVT prophylaxis: heparin Family Communication:  Disposition Plan: home when improved  Murray Hodgkins, MD  [facility]  Pager [phone] If 7PM-7AM, please contact night-coverage at [email or web address], password [facility] 05/16/2013, 4:11 PM  LOS: 2 days   Consultants:  Gastroenterology  Procedures:    Antibiotics:    HPI/Subjective: She reports a difficult night, continues to have loose stools. No vomiting however, some abdominal discomfort. She has been able to tolerate her diet.  Objective: Filed Vitals:   05/16/13 0524 05/16/13 0526 05/16/13 0528 05/16/13 1437  BP: 95/57 99/61 104/89 97/61  Pulse: 67 73 102 76  Temp: 97.3 F (36.3 C)   98 F (36.7 C)  TempSrc: Oral   Oral  Resp: 20   20  Height:      Weight:      SpO2: 99%   100%    Intake/Output Summary (Last 24 hours) at  05/16/13 1611 Last data filed at 05/16/13 1300  Gross per 24 hour  Intake 2968.33 ml  Output    450 ml  Net 2518.33 ml     Filed Weights   05/14/13 1100 05/14/13 1645 05/14/13 1819  Weight: 79.379 kg (175 lb) 79.3 kg (174 lb 13.2 oz) 78.926 kg (174 lb)    Exam:   Afebrile, vital signs are stable. No hypoxia.  Gen. Appears calm and comfortable. Speech fluent and clear.  Cardiovascular regular rate and rhythm. No murmur, rub or gallop.  Respiratory clear to auscultation bilaterally. No wheezes, rales or rhonchi. Normal respiratory effort.  Abdomen soft and nondistended. Minimal generalized tenderness.  Data Reviewed:  Capillary blood sugars stable.  Basic metabolic panel unremarkable.  C. difficile screen negative.  Scheduled Meds: . citalopram  40 mg Oral Daily  . dicyclomine  10 mg Oral TID AC  . guaiFENesin  600 mg Oral BID  . heparin  5,000 Units Subcutaneous 3 times per day  . hydrocortisone-pramoxine   Rectal BID  . insulin aspart  0-15 Units Subcutaneous TID WC  . pantoprazole  40 mg Oral Daily  . sodium chloride  3 mL Intravenous Q12H   Continuous Infusions:   Principal Problem:   Acute renal failure Active Problems:   DM (diabetes mellitus)   HTN (hypertension)   Diarrhea   Anal fissure   Hyperkalemia   Hypotension   Tobacco abuse   Time spent 15 minutes

## 2013-05-17 ENCOUNTER — Inpatient Hospital Stay (HOSPITAL_COMMUNITY): Payer: Medicaid Other

## 2013-05-17 LAB — BASIC METABOLIC PANEL
BUN: 11 mg/dL (ref 6–23)
CHLORIDE: 103 meq/L (ref 96–112)
CO2: 22 mEq/L (ref 19–32)
CREATININE: 1.29 mg/dL — AB (ref 0.50–1.10)
Calcium: 9.8 mg/dL (ref 8.4–10.5)
GFR calc non Af Amer: 47 mL/min — ABNORMAL LOW (ref >90)
GFR, EST AFRICAN AMERICAN: 55 mL/min — AB (ref >90)
Glucose, Bld: 107 mg/dL — ABNORMAL HIGH (ref 70–99)
Potassium: 4.6 mEq/L (ref 3.7–5.3)
Sodium: 137 mEq/L (ref 137–147)

## 2013-05-17 LAB — CBC
HCT: 34.6 % — ABNORMAL LOW (ref 36.0–46.0)
Hemoglobin: 11.6 g/dL — ABNORMAL LOW (ref 12.0–15.0)
MCH: 27.9 pg (ref 26.0–34.0)
MCHC: 33.5 g/dL (ref 30.0–36.0)
MCV: 83.2 fL (ref 78.0–100.0)
PLATELETS: 280 10*3/uL (ref 150–400)
RBC: 4.16 MIL/uL (ref 3.87–5.11)
RDW: 13.8 % (ref 11.5–15.5)
WBC: 6.5 10*3/uL (ref 4.0–10.5)

## 2013-05-17 LAB — GLUCOSE, CAPILLARY
GLUCOSE-CAPILLARY: 138 mg/dL — AB (ref 70–99)
Glucose-Capillary: 106 mg/dL — ABNORMAL HIGH (ref 70–99)
Glucose-Capillary: 129 mg/dL — ABNORMAL HIGH (ref 70–99)
Glucose-Capillary: 117 mg/dL — ABNORMAL HIGH (ref 70–99)

## 2013-05-17 MED ORDER — SODIUM CHLORIDE 0.9 % IV SOLN
INTRAVENOUS | Status: AC
  Administered 2013-05-17: 16:00:00 via INTRAVENOUS

## 2013-05-17 MED ORDER — CIPROFLOXACIN IN D5W 400 MG/200ML IV SOLN
400.0000 mg | Freq: Two times a day (BID) | INTRAVENOUS | Status: DC
  Administered 2013-05-18 – 2013-05-19 (×4): 400 mg via INTRAVENOUS
  Filled 2013-05-17 (×4): qty 200

## 2013-05-17 MED ORDER — LOPERAMIDE HCL 2 MG PO CAPS
2.0000 mg | ORAL_CAPSULE | Freq: Two times a day (BID) | ORAL | Status: DC
  Administered 2013-05-17 – 2013-05-19 (×4): 2 mg via ORAL
  Filled 2013-05-17 (×5): qty 1

## 2013-05-17 MED ORDER — METRONIDAZOLE IN NACL 5-0.79 MG/ML-% IV SOLN
500.0000 mg | Freq: Three times a day (TID) | INTRAVENOUS | Status: DC
  Administered 2013-05-18 – 2013-05-19 (×5): 500 mg via INTRAVENOUS
  Filled 2013-05-17 (×5): qty 100

## 2013-05-17 MED ORDER — IOHEXOL 300 MG/ML  SOLN
50.0000 mL | Freq: Once | INTRAMUSCULAR | Status: AC | PRN
  Administered 2013-05-17: 50 mL via ORAL

## 2013-05-17 NOTE — Progress Notes (Signed)
  Subjective:  Patient is a complaint of diarrhea. She says she had 4 stools yesterday and sore he had 5 stools today. She did notice bladder and tissue the last time she went to the bathroom. She also complains of soreness in both iliac fossae. She denies nausea or vomiting. She cannot tell any difference her dicyclomine which was begun yesterday.    Objective: Blood pressure 105/72, pulse 76, temperature 97.5 F (36.4 C), temperature source Oral, resp. rate 20, height 5\' 7"  (1.702 m), weight 174 lb (78.926 kg), last menstrual period 09/25/2011, SpO2 98.00%. Patient is alert and in no acute distress. She is getting ready to drink oral contrast for CT. Abdomen is full and symmetrical. Bowel sounds are normal. Abdomen is soft with mild tenderness in both iliac fossae without guarding rebound organomegaly or masses. No LE edema or clubbing noted.  Labs/studies Results: WBC 6.5, H&H 11.6 and 34.6 and platelet count 280K. Serum sodium 137, potassium 4.6, chloride 103, CO2 22, BUN 11, creatinine 1.29 and glucose 107. Celiac antibody panel is pending.  Assessment:  #1. Diarrhea. Acute on chronic diarrhea. C. difficile by PCR and GI pathogen panel are negative. Prior workup for diarrhea included colonoscopy 18 months ago and biopsies are negative for collagenous or microscopic colitis. It is interesting to note that she was constipated and took 2 doses of milk of magnesia before onset of acute diarrhea. Agree with plans for abdominopelvic CT now that she is complaining of lower abdominal pain. If CT shows colonic wall thickening she will need to be re-endoscoped. We did also be dealing with diabetic diarrhea which is a diagnosis of exclusion. #2. Elevated serum creatinine. Creatinine was 1.10 yesterday and 1.29 today  Recommendations; Loperamide 2 mg by mouth twice a day. Continue dicyclomine at current dose. If CT is unremarkable will consider increasing dose.

## 2013-05-17 NOTE — Progress Notes (Signed)
CT reveals cholelithiasis and mild sigmoid diverticulitis. Diet changed to full liquids and patient begun on Cipro and flagyl.

## 2013-05-17 NOTE — Progress Notes (Signed)
PROGRESS NOTE  [name] TIR:[id number] DOB: 10-11-1960 DOA: 05/14/2013 PCP: No primary provider on file.  Summary: 53 year old woman with two-week history of profuse diarrhea [facility] when she was noted to be hypotensive and referred to the emergency department. She has had some nausea but no vomiting. Oral intake has been poor. She reports dyspepsia ongoing.   Assessment/Plan: 1. Ongoing diarrhea for the last 2 weeks. Persists. Some left lower quadrant pain. GI pathogen panel and C. difficile PCR negative. Colonoscopy in the past negative for microscopic or collagenous colitis. Celiac screen pending. Suspected irritable bowel syndrome. GI also suggests metformin likely to be playing a role, alternative therapy recommended on discharge. 2. Acute renal failure. Initially resolved. Secondary to profuse diarrhea complicated by lisinopril. Now is no evidence of recurrent renal insufficiency likely secondary to diarrhea. 3. Hyperkalemia. Resolved secondary to dehydration and acute renal failure. 4. GERD. PPI therapy. 5. Diabetes mellitus. Stable. Continue sliding scale insulin.   Although she is able to tolerate a diet, profuse diarrhea continues with evidence of recurrent dehydration and renal insufficiency. Left lower quadrant pain is modest in nature but given her history of diverticulitis several years ago, as well as failure to improve, plan CT of the abdomen and pelvis to exclude acute abdominal pathology.  IVF.  Loperamide per GI.  Code Status: full code DVT prophylaxis: heparin Family Communication:  Disposition Plan: home when improved  Murray Hodgkins, MD  [facility]  Pager [phone] If 7PM-7AM, please contact night-coverage at [email or web address], password [email or web address] 05/17/2013, 4:02 PM  LOS: 3 days   Consultants:  Gastroenterology  Procedures:    Antibiotics:    HPI/Subjective: Continued to have profuse diarrhea. Appetite okay, tolerating diet.  No nausea or vomiting. Persistent left lower quadrant pain.  Objective: Filed Vitals:   05/16/13 1437 05/16/13 2107 05/17/13 0634 05/17/13 1300  BP: 97/61 104/70 106/73 105/72  Pulse: 76 84 71 76  Temp: 98 F (36.7 C) 97.7 F (36.5 C) 97.2 F (36.2 C) 97.5 F (36.4 C)  TempSrc: Oral Oral Oral Oral  Resp: 20 20 20 20   Height:      Weight:      SpO2: 100% 99% 98% 98%    Intake/Output Summary (Last 24 hours) at 05/17/13 1602 Last data filed at 05/17/13 1300  Gross per 24 hour  Intake    480 ml  Output   1350 ml  Net   -870 ml     Filed Weights   05/14/13 1100 05/14/13 1645 05/14/13 1819  Weight: 79.379 kg (175 lb) 79.3 kg (174 lb 13.2 oz) 78.926 kg (174 lb)    Exam:   Afebrile, vital signs are stable. No hypoxia.  Gen. Appears comfortable, calm. Cardiovascular regular rate and rhythm. No rub, gallop, murmur. No lower extremity edema.  Respiratory clear to auscultation bilaterally. No wheezes, rales or rhonchi. Normal respiratory effort.  Abdomen soft, nondistended. Modest left lower quadrant pain. No guarding. Positive bowel sounds.  Psychiatric grossly normal mood and affect. Speech fluent and appropriate.  Data Reviewed:  Capillary blood sugars stable.  Creatinine has increased, 1.29. CBC unremarkable.  GI pathogen panel negative.  Scheduled Meds: . citalopram  40 mg Oral Daily  . dicyclomine  10 mg Oral TID AC  . guaiFENesin  600 mg Oral BID  . heparin  5,000 Units Subcutaneous 3 times per day  . hydrocortisone-pramoxine   Rectal BID  . insulin aspart  0-15 Units Subcutaneous TID WC  . loperamide  2  mg Oral BID  . pantoprazole  40 mg Oral Daily  . sodium chloride  3 mL Intravenous Q12H   Continuous Infusions:   Principal Problem:   Acute renal failure Active Problems:   DM (diabetes mellitus)   HTN (hypertension)   Diarrhea   Anal fissure   Hyperkalemia   Hypotension   Tobacco abuse   Time spent 15 minutes

## 2013-05-18 DIAGNOSIS — K5732 Diverticulitis of large intestine without perforation or abscess without bleeding: Secondary | ICD-10-CM

## 2013-05-18 LAB — GLUCOSE, CAPILLARY
GLUCOSE-CAPILLARY: 119 mg/dL — AB (ref 70–99)
GLUCOSE-CAPILLARY: 100 mg/dL — AB (ref 70–99)
Glucose-Capillary: 118 mg/dL — ABNORMAL HIGH (ref 70–99)
Glucose-Capillary: 118 mg/dL — ABNORMAL HIGH (ref 70–99)

## 2013-05-18 LAB — GLIADIN ANTIBODIES, SERUM
GLIADIN IGA: 1 U/mL (ref <20)
GLIADIN IGG: 2.6 U/mL (ref <20)

## 2013-05-18 LAB — BASIC METABOLIC PANEL
BUN: 14 mg/dL (ref 6–23)
CHLORIDE: 102 meq/L (ref 96–112)
CO2: 23 mEq/L (ref 19–32)
Calcium: 9.3 mg/dL (ref 8.4–10.5)
Creatinine, Ser: 1.26 mg/dL — ABNORMAL HIGH (ref 0.50–1.10)
GFR calc Af Amer: 56 mL/min — ABNORMAL LOW (ref >90)
GFR, EST NON AFRICAN AMERICAN: 48 mL/min — AB (ref >90)
Glucose, Bld: 106 mg/dL — ABNORMAL HIGH (ref 70–99)
Potassium: 4.3 mEq/L (ref 3.7–5.3)
Sodium: 137 mEq/L (ref 137–147)

## 2013-05-18 LAB — TISSUE TRANSGLUTAMINASE, IGA: Tissue Transglutaminase Ab, IgA: 1.9 U/mL (ref <20)

## 2013-05-18 MED ORDER — SODIUM CHLORIDE 0.9 % IV SOLN
INTRAVENOUS | Status: AC
  Administered 2013-05-18: 12:00:00 via INTRAVENOUS

## 2013-05-18 MED ORDER — SODIUM CHLORIDE 0.9 % IV SOLN: INTRAVENOUS | Status: DC

## 2013-05-18 NOTE — Progress Notes (Signed)
Subjective; Patient continues to complain of pain across lower abdomen more on the left than on the right side. She denies upper abdominal pain. She had 7 stools yesterday 5 before and 2 after she drank oral contrast for CT. She had one stool today. She denies melena or rectal bleeding. She has good appetite and not happy about it and full liquids.  Objective; BP 108/71  Pulse 66  Temp(Src) 97.6 F (36.4 C) (Oral)  Resp 20  Ht 5\' 7"  (1.702 m)  Wt 174 lb (78.926 kg)  BMI 27.25 kg/m2  SpO2 98%  LMP 09/25/2011 Patient is alert and in no acute distress. Abdomen is full. Bowel sounds are normal. On palpation abdomen is soft with mild to moderate tenderness in LLQ and mild tenderness at RLQ. No guarding or rebound. No organomegaly or masses. No LE edema noted.  Lab data; Serum sodium 137, potassium 4.3, chloride 102, CO2 23, BUN 14, creatinine 1.26 Glucose 106. Celiac antibody panel is pending.  Abdominal pelvic CT reviewed with Dr. Kalman Jewels. Study pertinent for cholelithiasis and wall thickening to segment of sigmoid colon but no pericolonic stranding noted.   Assessment; #1. Diarrhea most likely secondary to IBS. Celiac antibody panel is pending. Presently she is on low-dose dicyclomine and loperamide. #2. LLQ abdominal pain felt to be secondary to mild sigmoid colitis but no pericolonic inflammatory changes evident on CT. Patient begun on IV Cipro and metronidazole last night. #3. Elevated serum creatinine. She may have an underlying CKG secondary to hypertension and diabetes. She is being hydrated since she received IV contrast yesterday. #4. Cholelithiasis felt to be asymptomatic at this time.  Recommendations; Continue full liquids another 24 hours. Will switch her to oral antibiotics in a.m.

## 2013-05-18 NOTE — Progress Notes (Signed)
[facility] PROGRESS NOTE  [name]:[id number] DOB: [birth year] DOA: 05/14/2013 PCP: No primary provider on file.   Summary:  53 year old woman with two-week history of profuse diarrhea [facility] when she was noted to be hypotensive and referred to the emergency department. She had some nausea but no vomiting. Oral intake has been poor. She reports dyspepsia ongoing  Assessment/Plan: 1. Ongoing diarrhea for the last 2 weeks. Appears to be slowing down only 1 BM so far today. Continues with  left lower quadrant pain and some nausea. GI pathogen panel and C. difficile PCR negative. Colonoscopy in the past negative for microscopic or collagenous colitis. Celiac screen pending. CT abdomen reveals cholelithiasis and mild sigmoid diverticulitis. Suspected irritable bowel syndrome as well per GI. Cipro and flagyl day #2. GI also suggests metformin likely to be playing a role, alternative therapy recommended on discharge. 2. Acute renal failure. Likely related to diarrhea.  Initially resolved and creatinine trending up today. May be a chronic component given hx diabetes and HTN.  Will continue gentle IV hydration and monitor 3. Hyperkalemia. Resolved secondary to dehydration and acute renal failure. 4. GERD. PPI therapy. 5. Diabetes mellitus. Stable. Continue sliding scale insulin.   Code Status: full Family Communication: none present Disposition Plan: home when ready hopefully tomorrow   Consultants:  GI Dr. Laural Golden  Procedures:  none  Antibiotics:  cipro 05/17/13>>  Flagyl 05/18/13>>  HPI/Subjective: Sitting on side of bed. Complains of nausea after chicken broth. Reports LLQ pain but somewhat improved.   Objective: Filed Vitals:   05/18/13 0449  BP: 108/71  Pulse: 66  Temp: 97.6 F (36.4 C)  Resp: 20    Intake/Output Summary (Last 24 hours) at 05/18/13 1118 Last data filed at 05/18/13 1107  Gross per 24 hour  Intake   1020 ml   Output   1650 ml  Net   -630 ml   Filed Weights   05/14/13 1100 05/14/13 1645 05/14/13 1819  Weight: 79.379 kg (175 lb) 79.3 kg (174 lb 13.2 oz) 78.926 kg (174 lb)    Exam:   General:  Calm appears comfortable NAD  Cardiovascular: RRR No MGR No LE edema  Respiratory: normal effort BS clear bilaterally no wheeze no rhonchi   Abdomen: soft +BS non-distended. Mild tenderness to lower quadrants on palpation.   Musculoskeletal: MOE joints without swelling/erythema  Data Reviewed: Basic Metabolic Panel:  Recent Labs Lab 05/14/13 2150 05/15/13 0545 05/16/13 0518 05/17/13 0550 05/18/13 0527  NA 136* 136* 137 137 137  K 5.2 5.2 4.7 4.6 4.3  CL 104 105 106 103 102  CO2 22 20 21 22 23   GLUCOSE 90 102* 99 107* 106*  BUN 23 17 9 11 14   CREATININE 1.29* 1.21* 1.10 1.29* 1.26*  CALCIUM 9.1 9.1 9.2 9.8 9.3   Liver Function Tests:  Recent Labs Lab 05/14/13 1140  AST 12  ALT 12  ALKPHOS 106  BILITOT 0.3  PROT 7.4  ALBUMIN 3.8    Recent Labs Lab 05/14/13 1140  LIPASE 57   No results found for this basename: AMMONIA,  in the last 168 hours CBC:  Recent Labs Lab 05/14/13 1140 05/15/13 0545 05/17/13 0550  WBC 11.3* 7.5 6.5  NEUTROABS 7.9*  --   --   HGB 13.2 11.0* 11.6*  HCT 39.3 32.4* 34.6*  MCV 82.9 82.7 83.2  PLT 321 255 280   Cardiac Enzymes:  Recent Labs Lab 05/14/13 1140  TROPONINI <0.30  BNP (last 3 results) No results found for this basename: PROBNP,  in the last 8760 hours CBG:  Recent Labs Lab 05/17/13 0739 05/17/13 1133 05/17/13 1638 05/17/13 2015 05/18/13 0745  GLUCAP 106* 129* 117* 138* 119*    Recent Results (from the past 240 hour(s))  CLOSTRIDIUM DIFFICILE BY PCR     Status: None   Collection Time    05/15/13  5:57 PM      Result Value Ref Range Status   C difficile by pcr NEGATIVE  NEGATIVE Final     Studies: Ct Abdomen Pelvis Wo Contrast  05/17/2013   CLINICAL DATA:  Left lower quadrant pain.  Diarrhea.  EXAM: CT  ABDOMEN AND PELVIS WITHOUT CONTRAST  TECHNIQUE: Multidetector CT imaging of the abdomen and pelvis was performed following the standard protocol without intravenous contrast.  COMPARISON:  03/10/2010  FINDINGS: Colonic diverticulosis is again demonstrated. Mild wall thickening is seen involving the sigmoid colon, consistent with mild diverticulitis. No evidence of abscess or free fluid. No evidence of bowel obstruction. Normal appendix is visualized.  Uterus and adnexal regions are unremarkable in appearance. Noncontrast images of the liver, pancreas, spleen, adrenal glands, and kidneys are unremarkable. No evidence hydronephrosis. No soft tissue masses or lymphadenopathy identified. Cholelithiasis is again demonstrated, however there is no evidence of cholecystitis or biliary dilatation.  IMPRESSION: Mild sigmoid diverticulitis. No evidence of abscess or other complication.  Cholelithiasis. No radiographic evidence of cholecystitis.   Electronically Signed   By: Earle Gell M.D.   On: 05/17/2013 20:45    Scheduled Meds: . ciprofloxacin  400 mg Intravenous BID  . citalopram  40 mg Oral Daily  . dicyclomine  10 mg Oral TID AC  . guaiFENesin  600 mg Oral BID  . heparin  5,000 Units Subcutaneous 3 times per day  . hydrocortisone-pramoxine   Rectal BID  . insulin aspart  0-15 Units Subcutaneous TID WC  . loperamide  2 mg Oral BID  . metronidazole  500 mg Intravenous Q8H  . pantoprazole  40 mg Oral Daily  . sodium chloride  3 mL Intravenous Q12H   Continuous Infusions:   Principal Problem:   Acute renal failure Active Problems:   DM (diabetes mellitus)   HTN (hypertension)   Diarrhea   Anal fissure   Hyperkalemia   Hypotension   Tobacco abuse   Diverticulitis of colon (without mention of hemorrhage)    Time spent: 30 minutes    [facility] Pager [phone]. If 7PM-7AM, please contact night-coverage at [email or web address], password [email or web address] 05/18/2013, 11:18 AM  LOS: 4 days

## 2013-05-18 NOTE — Progress Notes (Signed)
Patient seen, independently examined and chart reviewed. I agree with exam, assessment and plan discussed with Mckenzie Carrel, [name].  Subjective: A little better today, less diarrhea. No vomiting. Still has some left lower quadrant pain.  Objective: Afebrile, vital signs are stable. No hypoxia. Appears calm and comfortable. Speech fluent and clear. Cardiovascular regular rate and rhythm. No murmur, rub or gallop. Respiratory clear to auscultation bilaterally. No wheezes, rales or rhonchi. Normal respiratory effort. Abdomen soft, modest left lower quadrant pain.  Capillary blood sugars are stable. Creatinine stable 1.26, likely at baseline. CT suggested mild sigmoid diverticulitis. No evidence of abscess complicating features.  Overall she does appear to be improving. She has been placed on empiric antibiotics for mild diverticulitis although I am not convinced of this diagnosis. I think we should continue Bentyl and loperamide. Discussed with gastroenterology. Acute renal failure is resolved, I suspect her kidney function is now at baseline. Recheck basic metabolic panel in the morning. Likely home 3/24.  Murray Hodgkins, MD [facility] [phone]

## 2013-05-19 DIAGNOSIS — K5732 Diverticulitis of large intestine without perforation or abscess without bleeding: Secondary | ICD-10-CM

## 2013-05-19 DIAGNOSIS — E860 Dehydration: Secondary | ICD-10-CM

## 2013-05-19 LAB — BASIC METABOLIC PANEL
BUN: 8 mg/dL (ref 6–23)
CHLORIDE: 105 meq/L (ref 96–112)
CO2: 23 mEq/L (ref 19–32)
Calcium: 9.6 mg/dL (ref 8.4–10.5)
Creatinine, Ser: 1.29 mg/dL — ABNORMAL HIGH (ref 0.50–1.10)
GFR, EST AFRICAN AMERICAN: 55 mL/min — AB (ref >90)
GFR, EST NON AFRICAN AMERICAN: 47 mL/min — AB (ref >90)
Glucose, Bld: 110 mg/dL — ABNORMAL HIGH (ref 70–99)
Potassium: 4.3 mEq/L (ref 3.7–5.3)
SODIUM: 141 meq/L (ref 137–147)

## 2013-05-19 LAB — RETICULIN ANTIBODIES, IGA W TITER: Reticulin Ab, IgA: NEGATIVE

## 2013-05-19 LAB — GLUCOSE, CAPILLARY
GLUCOSE-CAPILLARY: 120 mg/dL — AB (ref 70–99)
Glucose-Capillary: 152 mg/dL — ABNORMAL HIGH (ref 70–99)

## 2013-05-19 MED ORDER — CIPROFLOXACIN HCL 500 MG PO TABS: 500.0000 mg | ORAL_TABLET | Freq: Two times a day (BID) | ORAL | Status: DC

## 2013-05-19 MED ORDER — GUAIFENESIN ER 600 MG PO TB12: 600.0000 mg | ORAL_TABLET | Freq: Two times a day (BID) | ORAL | Status: DC

## 2013-05-19 MED ORDER — DICYCLOMINE HCL 10 MG PO CAPS: 10.0000 mg | ORAL_CAPSULE | Freq: Three times a day (TID) | ORAL | Status: DC

## 2013-05-19 MED ORDER — PANTOPRAZOLE SODIUM 40 MG PO TBEC: 40.0000 mg | DELAYED_RELEASE_TABLET | Freq: Every day | ORAL | Status: DC

## 2013-05-19 MED ORDER — GLIPIZIDE 2.5 MG HALF TABLET: 2.5000 mg | ORAL_TABLET | Freq: Every day | ORAL | Status: DC

## 2013-05-19 MED ORDER — NIACIN-SIMVASTATIN ER 500-20 MG PO TB24: 1.0000 | ORAL_TABLET | Freq: Every day | ORAL | Status: DC

## 2013-05-19 MED ORDER — ZOLPIDEM TARTRATE 5 MG PO TABS: 5.0000 mg | ORAL_TABLET | Freq: Every evening | ORAL | Status: DC | PRN

## 2013-05-19 MED ORDER — HYDROCORTISONE ACE-PRAMOXINE 2.5-1 % RE CREA: TOPICAL_CREAM | Freq: Two times a day (BID) | RECTAL | Status: DC

## 2013-05-19 MED ORDER — METRONIDAZOLE 500 MG PO TABS: 500.0000 mg | ORAL_TABLET | Freq: Three times a day (TID) | ORAL | Status: DC

## 2013-05-19 MED ORDER — GLIPIZIDE 5 MG PO TABS: 2.5000 mg | ORAL_TABLET | Freq: Every day | ORAL | Status: DC

## 2013-05-19 MED ORDER — LOPERAMIDE HCL 2 MG PO CAPS: 2.0000 mg | ORAL_CAPSULE | Freq: Two times a day (BID) | ORAL | Status: DC

## 2013-05-19 MED ORDER — HYDROCODONE-ACETAMINOPHEN 5-325 MG PO TABS: 1.0000 | ORAL_TABLET | Freq: Four times a day (QID) | ORAL | Status: DC | PRN

## 2013-05-19 MED ORDER — CITALOPRAM HYDROBROMIDE 40 MG PO TABS: 40.0000 mg | ORAL_TABLET | Freq: Every day | ORAL | Status: DC

## 2013-05-19 NOTE — Progress Notes (Signed)
Patient given discharge instructions, follow up appointments and prescriptions. Patient verbalized understanding of all discharge instructions. Patient very verbal about inability to pay for prescriptions or follow up Dr appointments. Patient states she will keep the follow up appointment with dr Laural Golden. Patient given voucher for prescriptions and says she cannot afford the $3 per prescriptions but will get what she can filled. NP aware of patients inability to pay for prescriptions. Patient alert, oriented and in stable condition at the time of discharge. Patient discharged home with daughter.

## 2013-05-19 NOTE — Discharge Summary (Signed)
Physician Discharge Summary  [name] [id number] DOB: [birth year] DOA: 05/14/2013  PCP: No primary provider on file.  Admit date: 05/14/2013 Discharge date: 05/19/2013  Time spent: 40 minutes  Recommendations for Outpatient Follow-up:  1. Dr. Laural Golden 4/7 for follow up on diarrhea and presumed IBS and possible celiac. 2. PCP for evaluation of BP and glycemic control as medications changed due to likely CKD and persistent diarrhea  Discharge Diagnoses:  Principal Problem:   Acute renal failure Active Problems: Hyperkalemia   Hypotension  Diarrhea   Anal fissure  Diverticulitis of colon (without mention of hemorrhage)   DM (diabetes mellitus)   HTN (hypertension)      Tobacco abuse   Discharge Condition: stable  Diet recommendation: heart healthy  Filed Weights   05/14/13 1100 05/14/13 1645 05/14/13 1819  Weight: 79.379 kg (175 lb) 79.3 kg (174 lb 13.2 oz) 78.926 kg (174 lb)    History of present illness:  [name] is a 53 y.o. female with a past medical history that includes diabetes, hypertension, anal fissure, GERD, presents to the emergency department on 3/19 with a chief complaint of worsening rectal pain with diarrhea. She stated she had rectal pain for the previous 2 weeks and had [facility]. sHe was given cream that she had been using without much relief. On day of admission she went to her primary care provider for followup examination and it was discovered that she was hypotensive and dehydrated. She was sent to the emergency room for further evaluation. Basic metabolic panel yielded sodium 132 potassium 6.1 creatinine 1.7 BUN 34. Platelet count yielded mild leukocytosis. Vital signs significant for blood pressure of 84/55 and a heart rate of 118. She was afebrile and not hypoxic.   She stated that associated symptoms included nausea with no vomiting, diarrhea and decreased appetite. She indicated she had not been taking much  by mouth for 2 days. She stated that she had 2-3 loose stools  the most recent was in emergency room. He describes them as light brown and watery. She denied bright red blood per rectum or melena. She denied any recent antibiotic use. She denied dysuria hematuria frequency or urgency. He indicated she had been taking all her home medications which include lisinopril. She denied headache dizziness syncope or near syncope. He denied chest pain palpitations shortness of breath. She did have a chronic moist nonproductive cough she is a smoker. Of note chart review indicated she had a colonoscopy and 2013 with 2 small adenomas removed scattered diverticula throughout the colon. sHe also had small external hemorrhoids and an anal fissure. Her followup appointment in November of 2013 indicated that the fissure was healed.   Hospital Course:  1. Ongoing diarrhea for the last 2 weeks. Admitted and provided with bowel rest. Evaluated by Dr. Laural Golden GI who opined most likely secondary to IBS. Celiac panel pending at discharge and will be followed by GI. Continues with left lower quadrant pain but much improved at discharge. Tolerating diet at discharge. GI pathogen panel and C. difficile PCR negative. Colonoscopy in the past negative for microscopic or collagenous colitis. CT abdomen revealed cholelithiasis and mild sigmoid diverticulitis. Suspected irritable bowel syndrome as well per GI. Cipro and flagyl provided and will be given at discharge to complete 7 days.  GI also suggests metformin likely playing a role so this will be discharged and glipizide started.  2. Acute renal failure. Likely related to diarrhea. Suspect a chronic  component as well.  Initially resolved and creatinine trended up and is stable at 1.2 at discharge. Will discontinue lisinopril given SBP range 100-113 and pt with hypotension on admission. Recommend close OP follow up for monitoring BP control. 3. Hyperkalemia. Resolved secondary to  dehydration and acute renal failure. 4. GERD. PPI therapy. 5. Diabetes mellitus. Metformin held due to #1. A1c 6.7. CBG rnage 100-150 during hospitalization.  Will provide glipizide at discharge. Recommend close OP follow up to track glucose.   Procedures:  none  Consultations:  Dr. Laural Golden GI  Discharge Exam: Filed Vitals:   05/19/13 1434  BP: 102/68  Pulse: 62  Temp: 97.4 F (36.3 C)  Resp: 20    General: well nourished NAD Cardiovascular: RRR No m/g/r No LE edema Respiratory: normal effort BS clear bilaterally no wheeze no rhonchi  Discharge Instructions  Discharge Orders   Future Appointments Provider Department Dept Phone   06/02/2013 10:30 AM Butch Penny, NP [place] [phone]   Future Orders Complete By Expires   Diet - low sodium heart healthy  As directed    Discharge instructions  As directed    Comments:     Take medication as directed Follow up with Dr. Laural Golden as scheduled [facility] for [facility] of glucose control as medication have been changed.   Increase activity slowly  As directed        Medication List    STOP taking these medications       metFORMIN 1000 MG tablet  Commonly known as:  GLUCOPHAGE      TAKE these medications       aspirin 325 MG tablet  Take 325 mg by mouth daily.     ciprofloxacin 500 MG tablet  Commonly known as:  CIPRO  Take 1 tablet (500 mg total) by mouth 2 (two) times daily.     citalopram 40 MG tablet  Commonly known as:  CELEXA  Take 40 mg by mouth daily.     dicyclomine 10 MG capsule  Commonly known as:  BENTYL  Take 1 capsule (10 mg total) by mouth 3 (three) times daily before meals.     glipiZIDE 2.5 mg Tabs tablet  Commonly known as:  GLUCOTROL  Take 0.5 tablets (2.5 mg total) by mouth daily before breakfast.  Start taking on:  05/20/2013     guaiFENesin 600 MG 12 hr tablet  Commonly known as:  MUCINEX  Take 1 tablet (600 mg total) by mouth 2  (two) times daily.     HYDROcodone-acetaminophen 5-325 MG per tablet  Commonly known as:  NORCO/VICODIN  Take 1 tablet by mouth every 6 (six) hours as needed for moderate pain.     lisinopril 10 MG tablet  Commonly known as:  PRINIVIL,ZESTRIL  Take 10 mg by mouth daily.     loperamide 2 MG capsule  Commonly known as:  IMODIUM  Take 1 capsule (2 mg total) by mouth 2 (two) times daily.     metroNIDAZOLE 500 MG tablet  Commonly known as:  FLAGYL  Take 1 tablet (500 mg total) by mouth 3 (three) times daily.     niacin-simvastatin 500-20 MG 24 hr tablet  Commonly known as:  SIMCOR  Take 1 tablet by mouth at bedtime.     pantoprazole 40 MG tablet  Commonly known as:  PROTONIX  Take 1 tablet (40 mg total) by mouth daily.     hydrocortisone-pramoxine 2.5-1 % rectal cream  Commonly known as:  ANALPRAM-HC  Place rectally 2 (two) times daily.     pramoxine-hydrocortisone 1-1 % rectal cream  Commonly known as:  PROCTOCREAM-HC  Place 1 application rectally 2 (two) times daily.       No Known Allergies     Follow-up Information   Follow up with [name], MD On 06/02/2013. (appointment at  10:30)    Specialty:  Gastroenterology   Contact information:   [place], [address] [place] [address] [phone]       Follow up with [facility]. Schedule an appointment as soon as possible for a visit in 1 week. (follow up on diabetes control as medication changed. )        The results of significant diagnostics from this hospitalization (including imaging, microbiology, ancillary and laboratory) are listed below for reference.    Significant Diagnostic Studies: Ct Abdomen Pelvis Wo Contrast  05/17/2013   CLINICAL DATA:  Left lower quadrant pain.  Diarrhea.  EXAM: CT ABDOMEN AND PELVIS WITHOUT CONTRAST  TECHNIQUE: Multidetector CT imaging of the abdomen and pelvis was performed following the standard protocol without intravenous contrast.  COMPARISON:  03/10/2010   FINDINGS: Colonic diverticulosis is again demonstrated. Mild wall thickening is seen involving the sigmoid colon, consistent with mild diverticulitis. No evidence of abscess or free fluid. No evidence of bowel obstruction. Normal appendix is visualized.  Uterus and adnexal regions are unremarkable in appearance. Noncontrast images of the liver, pancreas, spleen, adrenal glands, and kidneys are unremarkable. No evidence hydronephrosis. No soft tissue masses or lymphadenopathy identified. Cholelithiasis is again demonstrated, however there is no evidence of cholecystitis or biliary dilatation.  IMPRESSION: Mild sigmoid diverticulitis. No evidence of abscess or other complication.  Cholelithiasis. No radiographic evidence of cholecystitis.   Electronically Signed   By: Earle Gell M.D.   On: 05/17/2013 20:45    Microbiology: Recent Results (from the past 240 hour(s))  CLOSTRIDIUM DIFFICILE BY PCR     Status: None   Collection Time    05/15/13  5:57 PM      Result Value Ref Range Status   C difficile by pcr NEGATIVE  NEGATIVE Final     Labs: Basic Metabolic Panel:  Recent Labs Lab 05/15/13 0545 05/16/13 0518 05/17/13 0550 05/18/13 0527 05/19/13 0456  NA 136* 137 137 137 141  K 5.2 4.7 4.6 4.3 4.3  CL 105 106 103 102 105  CO2 20 21 22 23 23   GLUCOSE 102* 99 107* 106* 110*  BUN 17 9 11 14 8   CREATININE 1.21* 1.10 1.29* 1.26* 1.29*  CALCIUM 9.1 9.2 9.8 9.3 9.6   Liver Function Tests:  Recent Labs Lab 05/14/13 1140  AST 12  ALT 12  ALKPHOS 106  BILITOT 0.3  PROT 7.4  ALBUMIN 3.8    Recent Labs Lab 05/14/13 1140  LIPASE 57   No results found for this basename: AMMONIA,  in the last 168 hours CBC:  Recent Labs Lab 05/14/13 1140 05/15/13 0545 05/17/13 0550  WBC 11.3* 7.5 6.5  NEUTROABS 7.9*  --   --   HGB 13.2 11.0* 11.6*  HCT 39.3 32.4* 34.6*  MCV 82.9 82.7 83.2  PLT 321 255 280   Cardiac Enzymes:  Recent Labs Lab 05/14/13 1140  TROPONINI <0.30    BNP: BNP (last 3 results) No results found for this basename: PROBNP,  in the last 8760 hours CBG:  Recent Labs Lab 05/18/13 1142 05/18/13 1630 05/18/13 2039 05/19/13 0747 05/19/13 1143  GLUCAP 118* 100* 118* 120* 152*       Signed:  BLACK,KAREN M  [facility] 05/19/2013, 2:40 PM

## 2013-05-19 NOTE — Progress Notes (Signed)
Nutrition Brief Note  RD pulled to chart due to LOS   Wt Readings from Last 15 Encounters:  05/14/13 174 lb (78.926 kg)  01/07/12 189 lb 3.2 oz (85.821 kg)  11/05/11 184 lb 3.2 oz (83.553 kg)  10/01/11 184 lb (83.462 kg)  10/01/11 184 lb (83.462 kg)    Body mass index is 27.25 kg/(m^2). Patient meets criteria for overweight based on current BMI.   Current diet order is Heart Healthy, patient is consuming approximately 100% of meals at this time. Labs and medications reviewed.   No nutrition interventions warranted at this time. If nutrition issues arise, please consult RD.   [name] A. [name], RD, LDN Pager: [phone]

## 2013-05-19 NOTE — Discharge Summary (Signed)
Patient seen and examined. Agree with note as above per Dyanne Carrel, NP.  Patient was admitted with diarrhea, dehydration and acute renal failure. She was rehydrated with IV fluids and her renal function has since returned to baseline. She was taking lisinopril prior to admission which has not been restarted. This can certainly be restarted in the outpatient setting as felt appropriate. Currently she is normotensive. Regarding her diarrhea, she was seen by gastroenterology. GI pathogen panel and C. difficile were found to be negative. CT of the abdomen and pelvis indicated possible mild sigmoid diverticulitis. She was started on antibiotics. Metformin was also discontinued since this may be playing a role in her GI symptoms. She has been started on glipizide in its place. Patient is significantly improved at this time. She is tolerating a solid diet and has not had any diarrhea. The patient is to discharge home. She will follow up with GI regarding further labs for celiac disease and followup for for possible IBS.  MEMON,JEHANZEB

## 2013-06-02 ENCOUNTER — Encounter (INDEPENDENT_AMBULATORY_CARE_PROVIDER_SITE_OTHER): Payer: Self-pay | Admitting: Internal Medicine

## 2013-06-02 ENCOUNTER — Ambulatory Visit (INDEPENDENT_AMBULATORY_CARE_PROVIDER_SITE_OTHER): Payer: Self-pay | Admitting: Internal Medicine

## 2013-06-02 VITALS — BP 104/62 | HR 56 | Temp 98.0°F | Ht 66.0 in | Wt 180.9 lb

## 2013-06-02 DIAGNOSIS — R197 Diarrhea, unspecified: Secondary | ICD-10-CM

## 2013-06-02 DIAGNOSIS — K5732 Diverticulitis of large intestine without perforation or abscess without bleeding: Secondary | ICD-10-CM

## 2013-06-02 MED ORDER — METRONIDAZOLE 500 MG PO TABS: 500.0000 mg | ORAL_TABLET | Freq: Two times a day (BID) | ORAL | Status: DC

## 2013-06-02 MED ORDER — CIPROFLOXACIN HCL 500 MG PO TABS: 500.0000 mg | ORAL_TABLET | Freq: Two times a day (BID) | ORAL | Status: DC

## 2013-06-02 NOTE — Patient Instructions (Addendum)
Imodium one a day. OV in 1 yr. If any problems call our office. Cipro and Flagyl x 1 week.

## 2013-06-02 NOTE — Progress Notes (Signed)
Subjective:     Patient ID: [name], female   DOB: 1960/03/02, 53 y.o.   MRN: [id number]  HPI Here today for f/u after recent admission to [facility] for diarrhea and dehydration in March. Admitted 19th-24th.  Her stool studies were negative. Celiac panel was negative.  Felt her diarrhea was possible from the Metformin. She was switched to Glipzide. She tells me she still has some pain in her left lower abdomen off an on .  When she has a BM she has a BM, it is soft, but loose. Appetite has been good for the most part. No acid reflux since starting the Protonix.  Occasionally left lower tenderness off an on but not constant. No fever. She c/o some rectal pain when she wipes. If she voids, she says the area will burn.  05/25/2013 WBC 9.1, H and H 13.2 and 39.2, MCV 81.8   10/01/2011 Colonoscopy Dr. Laural Golden: Diarrhea, rectal pain Impression:  Normal terminal ileum.  Pancolonic diverticulosis.  No endoscopic evidence of colitis. Random biopsy taken from mucosa of sigmoid colon looking for microscopic colitis.  Small cecal polyp ablated via cold biopsy.  5 mm polyp snared from sigmoid colon.  Small external hemorrhoids and anal fissure.    Review of Systems Past Medical History  Diagnosis Date  . Hypertension   . Diabetes mellitus   . Anxiety   . Depression   . GERD (gastroesophageal reflux disease)   . Seizures     none since age 28-no meds since age 51 and no seizures since ge 6  . Anal fissure     Past Surgical History  Procedure Laterality Date  . Dilation and curettage of uterus    . Back surgery  2005    lumbar disckectomy  . Foot bone excision      right  . Colonoscopy  10/01/2011    Procedure: COLONOSCOPY;  Surgeon: [name], MD;  Location: [facility];  Service: Endoscopy;  Laterality: N/A;  730    No Known Allergies  Current Outpatient Prescriptions on File Prior to Visit  Medication Sig Dispense Refill  . aspirin 325 MG tablet Take 325 mg by mouth daily.       . citalopram (CELEXA) 40 MG tablet Take 1 tablet (40 mg total) by mouth daily.      [name] dicyclomine (BENTYL) 10 MG capsule Take 1 capsule (10 mg total) by mouth 3 (three) times daily before meals.  90 capsule  0  . HYDROcodone-acetaminophen (NORCO/VICODIN) 5-325 MG per tablet Take 1 tablet by mouth every 6 (six) hours as needed for moderate pain.  20 tablet  0  . niacin-simvastatin (SIMCOR) 500-20 MG 24 hr tablet Take 1 tablet by mouth at bedtime.  30 tablet  0  . pantoprazole (PROTONIX) 40 MG tablet Take 1 tablet (40 mg total) by mouth daily.  30 tablet  0  . zolpidem (AMBIEN) 5 MG tablet Take 1 tablet (5 mg total) by mouth at bedtime as needed for sleep.  30 tablet  0  . hydrocortisone-pramoxine (ANALPRAM-HC) 2.5-1 % rectal cream Place rectally 2 (two) times daily.  30 g  0  . loperamide (IMODIUM) 2 MG capsule Take 1 capsule (2 mg total) by mouth 2 (two) times daily.  30 capsule  0  . pramoxine-hydrocortisone (PROCTOCREAM-HC) 1-1 % rectal cream Place 1 application rectally 2 (two) times daily.       No current facility-administered medications on file prior to visit.  Objective:   Physical Exam  Filed Vitals:   06/02/13 1037  BP: 104/62  Pulse: 56  Temp: 98 F (36.7 C)  Height: 5\' 6"  (1.676 m)  Weight: 180 lb 14.4 oz (82.056 kg)   Alert and oriented. Skin warm and dry. Oral mucosa is moist.   . Sclera anicteric, conjunctivae is pink. Thyroid not enlarged. No cervical lymphadenopathy. Lungs clear. Heart regular rate and rhythm.  Abdomen is soft. Bowel sounds are positive. No hepatomegaly. No abdominal masses felt. Very mild tenderness left lower quadrant.   No edema to lower extremities.       Assessment:    Diarrhea, better now since she started the Glipizide. Mild diverticulitis resolving.    Plan:    Diarrhea. Better now. She needs to try Imodium at least once a day. Continue fiber in diet. Am going to cover her with Cipro and Flagyl for 7 more day for her pain in her  left lower abdomen.

## 2013-08-20 ENCOUNTER — Encounter (INDEPENDENT_AMBULATORY_CARE_PROVIDER_SITE_OTHER): Payer: Self-pay

## 2014-04-08 ENCOUNTER — Encounter (INDEPENDENT_AMBULATORY_CARE_PROVIDER_SITE_OTHER): Payer: Self-pay | Admitting: *Deleted

## 2014-06-03 ENCOUNTER — Ambulatory Visit (INDEPENDENT_AMBULATORY_CARE_PROVIDER_SITE_OTHER): Payer: Disability Insurance | Admitting: Internal Medicine

## 2014-11-26 ENCOUNTER — Other Ambulatory Visit (HOSPITAL_COMMUNITY): Payer: Self-pay | Admitting: Family Medicine

## 2014-11-26 ENCOUNTER — Ambulatory Visit (HOSPITAL_COMMUNITY)
Admission: RE | Admit: 2014-11-26 | Discharge: 2014-11-26 | Disposition: A | Discharge status: Home / Self Care | Payer: Medicaid Other | Source: Ambulatory Visit | Attending: Family Medicine | Admitting: Family Medicine

## 2014-11-26 DIAGNOSIS — M7989 Other specified soft tissue disorders: Secondary | ICD-10-CM | POA: Insufficient documentation

## 2014-11-26 DIAGNOSIS — M5032 Other cervical disc degeneration, mid-cervical region: Secondary | ICD-10-CM | POA: Insufficient documentation

## 2014-11-26 DIAGNOSIS — M542 Cervicalgia: Secondary | ICD-10-CM | POA: Insufficient documentation

## 2015-06-07 ENCOUNTER — Other Ambulatory Visit (HOSPITAL_COMMUNITY): Payer: Self-pay | Admitting: Family Medicine

## 2015-06-07 DIAGNOSIS — Z1231 Encounter for screening mammogram for malignant neoplasm of breast: Secondary | ICD-10-CM

## 2015-06-13 ENCOUNTER — Ambulatory Visit (HOSPITAL_COMMUNITY)
Admission: RE | Admit: 2015-06-13 | Discharge: 2015-06-13 | Disposition: A | Discharge status: Home / Self Care | Payer: Medicaid Other | Source: Ambulatory Visit | Attending: Family Medicine | Admitting: Family Medicine

## 2015-06-13 DIAGNOSIS — Z1231 Encounter for screening mammogram for malignant neoplasm of breast: Secondary | ICD-10-CM | POA: Insufficient documentation

## 2016-05-02 ENCOUNTER — Other Ambulatory Visit (HOSPITAL_COMMUNITY): Payer: Self-pay | Admitting: Family Medicine

## 2016-05-02 DIAGNOSIS — Z1231 Encounter for screening mammogram for malignant neoplasm of breast: Secondary | ICD-10-CM

## 2016-06-12 ENCOUNTER — Encounter (INDEPENDENT_AMBULATORY_CARE_PROVIDER_SITE_OTHER): Payer: Self-pay | Admitting: Internal Medicine

## 2016-06-13 ENCOUNTER — Ambulatory Visit (HOSPITAL_COMMUNITY)
Admission: RE | Admit: 2016-06-13 | Discharge: 2016-06-13 | Disposition: A | Discharge status: Home / Self Care | Payer: Medicaid Other | Source: Ambulatory Visit | Attending: Family Medicine | Admitting: Family Medicine

## 2016-06-13 DIAGNOSIS — Z1231 Encounter for screening mammogram for malignant neoplasm of breast: Secondary | ICD-10-CM

## 2016-06-14 ENCOUNTER — Telehealth (INDEPENDENT_AMBULATORY_CARE_PROVIDER_SITE_OTHER): Payer: Self-pay | Admitting: *Deleted

## 2016-06-14 ENCOUNTER — Ambulatory Visit (INDEPENDENT_AMBULATORY_CARE_PROVIDER_SITE_OTHER): Payer: Medicaid Other | Admitting: Internal Medicine

## 2016-06-14 ENCOUNTER — Encounter (INDEPENDENT_AMBULATORY_CARE_PROVIDER_SITE_OTHER): Payer: Self-pay | Admitting: Internal Medicine

## 2016-06-14 ENCOUNTER — Other Ambulatory Visit (INDEPENDENT_AMBULATORY_CARE_PROVIDER_SITE_OTHER): Payer: Self-pay | Admitting: Internal Medicine

## 2016-06-14 ENCOUNTER — Encounter (INDEPENDENT_AMBULATORY_CARE_PROVIDER_SITE_OTHER): Payer: Self-pay

## 2016-06-14 ENCOUNTER — Encounter (INDEPENDENT_AMBULATORY_CARE_PROVIDER_SITE_OTHER): Payer: Self-pay | Admitting: *Deleted

## 2016-06-14 VITALS — BP 130/74 | HR 76 | Temp 97.6°F | Ht 67.0 in | Wt 183.6 lb

## 2016-06-14 DIAGNOSIS — Z86010 Personal history of colonic polyps: Secondary | ICD-10-CM | POA: Insufficient documentation

## 2016-06-14 DIAGNOSIS — K580 Irritable bowel syndrome with diarrhea: Secondary | ICD-10-CM

## 2016-06-14 MED ORDER — PEG 3350-KCL-NA BICARB-NACL 420 G PO SOLR: 4000.0000 mL | Freq: Once | ORAL | 0 refills | Status: AC

## 2016-06-14 NOTE — Patient Instructions (Addendum)
Fiber 4 gms daily. Recall for colonoscopy in August.

## 2016-06-14 NOTE — Telephone Encounter (Signed)
Patient needs trilyte 

## 2016-06-14 NOTE — Progress Notes (Signed)
Subjective:    Patient ID: [name], female    DOB: 1960/09/15, 56 y.o.   MRN: [id number]  HPI She presents today with c/o alternating between constipation and diarrhea. She says she has rectal pain with straining. She says she cannot use a stool softener, because it makes her stools soft. She average about 2-3 stools a day. For the past 3 months her symptoms have worsened. Her appetite is good. No weight loss. She has had constipation about  4 times since January.  Most of her stools are diarrhea.      Her last colonoscopy was in 2013   Impression:  Normal terminal ileum. Pancolonic diverticulosis. No endoscopic evidence of colitis. Random biopsy taken from mucosa of sigmoid colon looking for microscopic colitis. Small cecal polyp ablated via cold biopsy. 5 mm polyp snared from sigmoid colon. Small external hemorrhoids and anal fissure. Biopsy Tubular adenoma. Review of Systems Past Medical History:  Diagnosis Date  . Anal fissure   . Anxiety   . Depression   . Diabetes mellitus   . GERD (gastroesophageal reflux disease)   . Hypertension   . Seizures ([place])    none since age 24-no meds since age 5 and no seizures since ge 6    Past Surgical History:  Procedure Laterality Date  . BACK SURGERY  2005   lumbar disckectomy  . COLONOSCOPY  10/01/2011   Procedure: COLONOSCOPY;  Surgeon: [name], MD;  Location: [facility];  Service: Endoscopy;  Laterality: N/A;  730  . DILATION AND CURETTAGE OF UTERUS    . FOOT BONE EXCISION     right    No Known Allergies  Current Outpatient Prescriptions on File Prior to Visit  Medication Sig Dispense Refill  . glipiZIDE (GLUCOTROL) 2.5 mg TABS tablet Take 2.5 mg by mouth 2 (two) times daily before a meal.    . lisinopril (PRINIVIL,ZESTRIL) 2.5 MG tablet Take 2.5 mg by mouth daily.    . pantoprazole (PROTONIX) 40 MG tablet Take 1 tablet (40 mg total) by mouth daily. 30 tablet 0  . zolpidem (AMBIEN) 5 MG tablet Take 1  tablet (5 mg total) by mouth at bedtime as needed for sleep. 30 tablet 0  . aspirin 325 MG tablet Take 325 mg by mouth daily.    . ciprofloxacin (CIPRO) 500 MG tablet Take 1 tablet (500 mg total) by mouth 2 (two) times daily. (Patient not taking: Reported on 06/14/2016) 14 tablet 0  . citalopram (CELEXA) 40 MG tablet Take 1 tablet (40 mg total) by mouth daily. (Patient not taking: Reported on 06/14/2016)    . dicyclomine (BENTYL) 10 MG capsule Take 1 capsule (10 mg total) by mouth 3 (three) times daily before meals. (Patient not taking: Reported on 06/14/2016) 90 capsule 0  . HYDROcodone-acetaminophen (NORCO/VICODIN) 5-325 MG per tablet Take 1 tablet by mouth every 6 (six) hours as needed for moderate pain. (Patient not taking: Reported on 06/14/2016) 20 tablet 0  . hydrocortisone-pramoxine (ANALPRAM-HC) 2.5-1 % rectal cream Place rectally 2 (two) times daily. (Patient not taking: Reported on 06/14/2016) 30 g 0  . loperamide (IMODIUM) 2 MG capsule Take 1 capsule (2 mg total) by mouth 2 (two) times daily. (Patient not taking: Reported on 06/14/2016) 30 capsule 0  . metroNIDAZOLE (FLAGYL) 500 MG tablet Take 1 tablet (500 mg total) by mouth 2 (two) times daily. (Patient not taking: Reported on 06/14/2016) 14 tablet 0  . niacin-simvastatin (SIMCOR) 500-20 MG 24 hr tablet Take 1  tablet by mouth at bedtime. (Patient not taking: Reported on 06/14/2016) 30 tablet 0  . pramoxine-hydrocortisone (PROCTOCREAM-HC) 1-1 % rectal cream Place 1 application rectally 2 (two) times daily.     No current facility-administered medications on file prior to visit.        Objective:   Physical Exam Blood pressure 130/74, pulse 76, temperature 97.6 F (36.4 C), height 5\' 7"  (1.702 m), weight 183 lb 9.6 oz (83.3 kg), last menstrual period 09/25/2011.  Alert and oriented. Skin warm and dry. Oral mucosa is moist.   . Sclera anicteric, conjunctivae is pink. Thyroid not enlarged. No cervical lymphadenopathy. Lungs clear. Heart regular  rate and rhythm.  Abdomen is soft. Bowel sounds are positive. No hepatomegaly. No abdominal masses felt. No tenderness.  No edema to lower extremities.         Assessment & Plan:  Diarrhea/Constipation., Try fiber 4 gms daily. Due for a colonoscopy this year.

## 2016-10-04 ENCOUNTER — Ambulatory Visit (HOSPITAL_COMMUNITY)
Admission: RE | Admit: 2016-10-04 | Discharge: 2016-10-04 | Disposition: A | Discharge status: Home / Self Care | Payer: Medicaid Other | Source: Ambulatory Visit | Attending: Internal Medicine | Admitting: Internal Medicine

## 2016-10-04 ENCOUNTER — Encounter (HOSPITAL_COMMUNITY)
Admission: RE | Disposition: A | Discharge status: Home / Self Care | Payer: Self-pay | Source: Ambulatory Visit | Attending: Internal Medicine

## 2016-10-04 ENCOUNTER — Encounter (HOSPITAL_COMMUNITY): Payer: Self-pay | Admitting: *Deleted

## 2016-10-04 DIAGNOSIS — I10 Essential (primary) hypertension: Secondary | ICD-10-CM | POA: Insufficient documentation

## 2016-10-04 DIAGNOSIS — Z1211 Encounter for screening for malignant neoplasm of colon: Secondary | ICD-10-CM | POA: Insufficient documentation

## 2016-10-04 DIAGNOSIS — Z79899 Other long term (current) drug therapy: Secondary | ICD-10-CM | POA: Insufficient documentation

## 2016-10-04 DIAGNOSIS — F329 Major depressive disorder, single episode, unspecified: Secondary | ICD-10-CM | POA: Diagnosis not present

## 2016-10-04 DIAGNOSIS — E7800 Pure hypercholesterolemia, unspecified: Secondary | ICD-10-CM | POA: Diagnosis not present

## 2016-10-04 DIAGNOSIS — F419 Anxiety disorder, unspecified: Secondary | ICD-10-CM | POA: Diagnosis not present

## 2016-10-04 DIAGNOSIS — F17210 Nicotine dependence, cigarettes, uncomplicated: Secondary | ICD-10-CM | POA: Diagnosis not present

## 2016-10-04 DIAGNOSIS — K219 Gastro-esophageal reflux disease without esophagitis: Secondary | ICD-10-CM | POA: Diagnosis not present

## 2016-10-04 DIAGNOSIS — D125 Benign neoplasm of sigmoid colon: Secondary | ICD-10-CM | POA: Diagnosis not present

## 2016-10-04 DIAGNOSIS — Z7984 Long term (current) use of oral hypoglycemic drugs: Secondary | ICD-10-CM | POA: Insufficient documentation

## 2016-10-04 DIAGNOSIS — Z86010 Personal history of colonic polyps: Secondary | ICD-10-CM | POA: Insufficient documentation

## 2016-10-04 DIAGNOSIS — K648 Other hemorrhoids: Secondary | ICD-10-CM | POA: Insufficient documentation

## 2016-10-04 DIAGNOSIS — K5730 Diverticulosis of large intestine without perforation or abscess without bleeding: Secondary | ICD-10-CM | POA: Diagnosis not present

## 2016-10-04 DIAGNOSIS — E119 Type 2 diabetes mellitus without complications: Secondary | ICD-10-CM | POA: Diagnosis not present

## 2016-10-04 DIAGNOSIS — D122 Benign neoplasm of ascending colon: Secondary | ICD-10-CM

## 2016-10-04 DIAGNOSIS — Z09 Encounter for follow-up examination after completed treatment for conditions other than malignant neoplasm: Secondary | ICD-10-CM | POA: Diagnosis not present

## 2016-10-04 HISTORY — PX: COLONOSCOPY: SHX5424

## 2016-10-04 HISTORY — DX: Pure hypercholesterolemia, unspecified: E78.00

## 2016-10-04 HISTORY — PX: POLYPECTOMY: SHX5525

## 2016-10-04 LAB — GLUCOSE, CAPILLARY: Glucose-Capillary: 172 mg/dL — ABNORMAL HIGH (ref 65–99)

## 2016-10-04 SURGERY — COLONOSCOPY
Anesthesia: Moderate Sedation

## 2016-10-04 MED ORDER — MIDAZOLAM HCL 5 MG/5ML IJ SOLN
INTRAMUSCULAR | Status: DC | PRN
Start: 2016-10-04 — End: 2016-10-04
  Administered 2016-10-04 (×4): 2 mg via INTRAVENOUS

## 2016-10-04 MED ORDER — MIDAZOLAM HCL 5 MG/5ML IJ SOLN
INTRAMUSCULAR | Status: AC
  Filled 2016-10-04: qty 10

## 2016-10-04 MED ORDER — STERILE WATER FOR IRRIGATION IR SOLN
Status: DC | PRN
  Administered 2016-10-04: 10:00:00

## 2016-10-04 MED ORDER — SODIUM CHLORIDE 0.9 % IV SOLN
INTRAVENOUS | Status: DC
  Administered 2016-10-04: 10:00:00 via INTRAVENOUS

## 2016-10-04 MED ORDER — MEPERIDINE HCL 50 MG/ML IJ SOLN
INTRAMUSCULAR | Status: DC | PRN
  Administered 2016-10-04 (×2): 25 mg via INTRAVENOUS

## 2016-10-04 MED ORDER — DICYCLOMINE HCL 10 MG PO CAPS: 10.0000 mg | ORAL_CAPSULE | Freq: Two times a day (BID) | ORAL | 5 refills | Status: DC

## 2016-10-04 MED ORDER — MEPERIDINE HCL 50 MG/ML IJ SOLN
INTRAMUSCULAR | Status: AC
  Filled 2016-10-04: qty 1

## 2016-10-04 NOTE — Op Note (Signed)
[facility] Patient Name: [name] Procedure Date: 10/04/2016 9:50 AM MRN: [id number] Date of Birth: 10-21-1960 Attending MD: Hildred Laser , MD CSN: [id number] Age: 56 Admit Type: Outpatient Procedure:                Colonoscopy Indications:              High risk colon cancer surveillance: Personal                            history of colonic polyps Providers:                Hildred Laser, MD, Otis Peak B. Sharon Seller, RN, Janeece Riggers, RN Referring MD:             [name], MD Medicines:                Meperidine 50 mg IV, Midazolam 8 mg IV Complications:            No immediate complications. Estimated Blood Loss:     Estimated blood loss was minimal. Procedure:                Pre-Anesthesia Assessment:                           - Prior to the procedure, a History and Physical                            was performed, and patient medications and                            allergies were reviewed. The patient's tolerance of                            previous anesthesia was also reviewed. The risks                            and benefits of the procedure and the sedation                            options and risks were discussed with the patient.                            All questions were answered, and informed consent                            was obtained. Prior Anticoagulants: The patient has                            taken no previous anticoagulant or antiplatelet                            agents. ASA Grade Assessment: II - A patient with  mild systemic disease. After reviewing the risks                            and benefits, the patient was deemed in                            satisfactory condition to undergo the procedure.                           After obtaining informed consent, the colonoscope                            was passed under direct vision. Throughout the                            procedure,  the patient's blood pressure, pulse, and                            oxygen saturations were monitored continuously. The                            EC-[id number] ([id number]) was introduced through the                            anus and advanced to the the cecum, identified by                            appendiceal orifice and ileocecal valve. The                            colonoscopy was performed without difficulty. The                            patient tolerated the procedure well. The quality                            of the bowel preparation was adequate. The                            ileocecal valve, appendiceal orifice, and rectum                            were photographed. Scope In: 10:13:42 AM Scope Out: 10:34:24 AM Scope Withdrawal Time: 0 hours 8 minutes 41 seconds  Total Procedure Duration: 0 hours 20 minutes 42 seconds  Findings:      The perianal and digital rectal examinations were normal.      Two sessile polyps were found in the proximal sigmoid colon and       ascending colon. The polyps were small in size. These were biopsied with       a cold forceps for histology. The pathology specimen was placed into       Bottle Number 1.      Multiple small and large-mouthed diverticula were found in the sigmoid       colon, descending colon, splenic  flexure and transverse colon.      Internal hemorrhoids were found during retroflexion. The hemorrhoids       were small. Impression:               - Two small polyps in the proximal sigmoid colon                            and in the ascending colon. Biopsied.                           - Diverticulosis in the sigmoid colon, in the                            descending colon, at the splenic flexure and in the                            transverse colon.                           - Internal hemorrhoids. Moderate Sedation:      Moderate (conscious) sedation was administered by the endoscopy nurse       and supervised by the  endoscopist. The following parameters were       monitored: oxygen saturation, heart rate, blood pressure, CO2       capnography and response to care. Total physician intraservice time was       28 minutes. Recommendation:           - Patient has a contact number available for                            emergencies. The signs and symptoms of potential                            delayed complications were discussed with the                            patient. Return to normal activities tomorrow.                            Written discharge instructions were provided to the                            patient.                           - High fiber diet today.                           - Continue present medications.                           - Await pathology results.                           - Repeat colonoscopy in 5 years for surveillance. Procedure Code(s):        --- Professional ---  21308, Colonoscopy, flexible; with biopsy, single                            or multiple                           99152, Moderate sedation services provided by the                            same physician or other qualified health care                            professional performing the diagnostic or                            therapeutic service that the sedation supports,                            requiring the presence of an independent trained                            observer to assist in the monitoring of the                            patient's level of consciousness and physiological                            status; initial 15 minutes of intraservice time,                            patient age 71 years or older                           [phone], Moderate sedation services; each additional                            15 minutes intraservice time Diagnosis Code(s):        --- Professional ---                           Z86.010, Personal history of colonic polyps                            D12.5, Benign neoplasm of sigmoid colon                           D12.2, Benign neoplasm of ascending colon                           K64.8, Other hemorrhoids                           K57.30, Diverticulosis of large intestine without                            perforation or abscess without  bleeding CPT copyright 2016 American [employer] Association. All rights reserved. The codes documented in this report are preliminary and upon coder review may  be revised to meet current compliance requirements. Hildred Laser, MD Hildred Laser, MD 10/04/2016 10:43:09 AM This report has been signed electronically. Number of Addenda: 0

## 2016-10-04 NOTE — Discharge Instructions (Signed)
No aspirin or NSAIDs for 24 hours Resume usual medications and high fiber diet. Dicyclomine 10 mg daily before breakfast and lunch. No driving for 24 hours. Physician will call with biopsy results.  Colonoscopy, Adult, Care After This sheet gives you information about how to care for yourself after your procedure. Your doctor may also give you more specific instructions. If you have problems or questions, call your doctor. Follow these instructions at home: General instructions   For the first 24 hours after the procedure: ? Do not drive or use machinery. ? Do not sign important documents. ? Do not drink alcohol. ? Do your daily activities more slowly than normal. ? Eat foods that are soft and easy to digest. ? Rest often.  Take over-the-counter or prescription medicines only as told by your doctor.  It is up to you to get the results of your procedure. Ask your doctor, or the department performing the procedure, when your results will be ready. To help cramping and bloating:  Try walking around.  Put heat on your belly (abdomen) as told by your doctor. Use a heat source that your doctor recommends, such as a moist heat pack or a heating pad. ? Put a towel between your skin and the heat source. ? Leave the heat on for 20-30 minutes. ? Remove the heat if your skin turns bright red. This is especially important if you cannot feel pain, heat, or cold. You can get burned. Eating and drinking  Drink enough fluid to keep your pee (urine) clear or pale yellow.  Return to your normal diet as told by your doctor. Avoid heavy or fried foods that are hard to digest.  Avoid drinking alcohol for as long as told by your doctor. Contact a doctor if:  You have blood in your poop (stool) 2-3 days after the procedure. Get help right away if:  You have more than a small amount of blood in your poop.  You see large clumps of tissue (blood clots) in your poop.  Your belly is swollen.  You  feel sick to your stomach (nauseous).  You throw up (vomit).  You have a fever.  You have belly pain that gets worse, and medicine does not help your pain. This information is not intended to replace advice given to you by your health care provider. Make sure you discuss any questions you have with your health care provider. Document Released: 03/17/2010 Document Revised: 11/07/2015 Document Reviewed: 11/07/2015 Elsevier Interactive Patient Education  2017 [employer]  Diverticulosis Diverticulosis is a condition that develops when small pouches (diverticula) form in the wall of the large intestine (colon). The colon is where water is absorbed and stool is formed. The pouches form when the inside layer of the colon pushes through weak spots in the outer layers of the colon. You may have a few pouches or many of them. What are the causes? The cause of this condition is not known. What increases the risk? The following factors may make you more likely to develop this condition:  Being older than age 49. Your risk for this condition increases with age. Diverticulosis is rare among people younger than age 25. By age 21, many people have it.  Eating a low-fiber diet.  Having frequent constipation.  Being overweight.  Not getting enough exercise.  Smoking.  Taking over-the-counter pain medicines, like aspirin and ibuprofen.  Having a family history of diverticulosis.  What are the signs or symptoms? In most people, there are  no symptoms of this condition. If you do have symptoms, they may include:  Bloating.  Cramps in the abdomen.  Constipation or diarrhea.  Pain in the lower left side of the abdomen.  How is this diagnosed? This condition is most often diagnosed during an exam for other colon problems. Because diverticulosis usually has no symptoms, it often cannot be diagnosed independently. This condition may be diagnosed by:  Using a flexible scope to examine the  colon (colonoscopy).  Taking an X-ray of the colon after dye has been put into the colon (barium enema).  Doing a CT scan.  How is this treated? You may not need treatment for this condition if you have never developed an infection related to diverticulosis. If you have had an infection before, treatment may include:  Eating a high-fiber diet. This may include eating more fruits, vegetables, and grains.  Taking a fiber supplement.  Taking a live bacteria supplement (probiotic).  Taking medicine to relax your colon.  Taking antibiotic medicines.  Follow these instructions at home:  Drink 6-8 glasses of water or more each day to prevent constipation.  Try not to strain when you have a bowel movement.  If you have had an infection before: ? Eat more fiber as directed by your health care provider or your diet and nutrition specialist (dietitian). ? Take a fiber supplement or probiotic, if your health care provider approves.  Take over-the-counter and prescription medicines only as told by your health care provider.  If you were prescribed an antibiotic, take it as told by your health care provider. Do not stop taking the antibiotic even if you start to feel better.  Keep all follow-up visits as told by your health care provider. This is important. Contact a health care provider if:  You have pain in your abdomen.  You have bloating.  You have cramps.  You have not had a bowel movement in 3 days. Get help right away if:  Your pain gets worse.  Your bloating becomes very bad.  You have a fever or chills, and your symptoms suddenly get worse.  You vomit.  You have bowel movements that are bloody or black.  You have bleeding from your rectum. Summary  Diverticulosis is a condition that develops when small pouches (diverticula) form in the wall of the large intestine (colon).  You may have a few pouches or many of them.  This condition is most often diagnosed  during an exam for other colon problems.  If you have had an infection related to diverticulosis, treatment may include increasing the fiber in your diet, taking supplements, or taking medicines. This information is not intended to replace advice given to you by your health care provider. Make sure you discuss any questions you have with your health care provider. Document Released: 11/10/2003 Document Revised: 01/02/2016 Document Reviewed: 01/02/2016 Elsevier Interactive Patient Education  2017 [employer].  Colon Polyps Polyps are tissue growths inside the body. Polyps can grow in many places, including the large intestine (colon). A polyp may be a round bump or a mushroom-shaped growth. You could have one polyp or several. Most colon polyps are noncancerous (benign). However, some colon polyps can become cancerous over time. What are the causes? The exact cause of colon polyps is not known. What increases the risk? This condition is more likely to develop in people who:  Have a family history of colon cancer or colon polyps.  Are older than 50 or older than 45 if  they are African American.  Have inflammatory bowel disease, such as ulcerative colitis or Crohn disease.  Are overweight.  Smoke cigarettes.  Do not get enough exercise.  Drink too much alcohol.  Eat a diet that is: ? High in fat and red meat. ? Low in fiber.  Had childhood cancer that was treated with abdominal radiation.  What are the signs or symptoms? Most polyps do not cause symptoms. If you have symptoms, they may include:  Blood coming from your rectum when having a bowel movement.  Blood in your stool.The stool may look dark red or black.  A change in bowel habits, such as constipation or diarrhea.  How is this diagnosed? This condition is diagnosed with a colonoscopy. This is a procedure that uses a lighted, flexible scope to look at the inside of your colon. How is this treated? Treatment for  this condition involves removing any polyps that are found. Those polyps will then be tested for cancer. If cancer is found, your health care provider will talk to you about options for colon cancer treatment. Follow these instructions at home: Diet  Eat plenty of fiber, such as fruits, vegetables, and whole grains.  Eat foods that are high in calcium and vitamin D, such as milk, cheese, yogurt, eggs, liver, fish, and broccoli.  Limit foods high in fat, red meats, and processed meats, such as hot dogs, sausage, bacon, and lunch meats.  Maintain a healthy weight, or lose weight if recommended by your health care provider. General instructions  Do not smoke cigarettes.  Do not drink alcohol excessively.  Keep all follow-up visits as told by your health care provider. This is important. This includes keeping regularly scheduled colonoscopies. Talk to your health care provider about when you need a colonoscopy.  Exercise every day or as told by your health care provider. Contact a health care provider if:  You have new or worsening bleeding during a bowel movement.  You have new or increased blood in your stool.  You have a change in bowel habits.  You unexpectedly lose weight. This information is not intended to replace advice given to you by your health care provider. Make sure you discuss any questions you have with your health care provider. Document Released: 11/09/2003 Document Revised: 07/21/2015 Document Reviewed: 01/03/2015 Elsevier Interactive Patient Education  [employer].

## 2016-10-04 NOTE — H&P (Signed)
[name] is an 56 y.o. female.   Chief Complaint: Patient is here for colonoscopy. HPI: Patient is 56 year old Caucasian female was history of colonic adenomas. She had 2 adenomas removed 5 years ago. She is here for surveillance colonoscopy. She denies rectal bleeding. She remains with irregular bowel movements. She has diarrhea alternating with constipation. She also reports intermittent pain in left lower quadrant. She says she is trying to lose some weight. Family history is negative for celiac disease or CRC.  Past Medical History:  Diagnosis Date  . Anal fissure   . Anxiety   . Depression   . Diabetes mellitus   . GERD (gastroesophageal reflux disease)   . Hypercholesteremia   . Hypertension   . Seizures ([place])    none since age 37-no meds since age 10 and no seizures since ge 6    Past Surgical History:  Procedure Laterality Date  . BACK SURGERY  2005   lumbar disckectomy  . COLONOSCOPY  10/01/2011   Procedure: COLONOSCOPY;  Surgeon: [name], MD;  Location: [facility];  Service: Endoscopy;  Laterality: N/A;  730  . DILATION AND CURETTAGE OF UTERUS    . FOOT BONE EXCISION     right    Family History  Problem Relation Age of Onset  . Arthritis Mother   . Hernia Mother   . Constipation Mother   . Colon cancer Neg Hx    Social History:  reports that she has been smoking Cigarettes.  She has a 18.50 pack-year smoking history. She has never used smokeless tobacco. She reports that she does not drink alcohol or use drugs.  Allergies: No Known Allergies  Medications Prior to Admission  Medication Sig Dispense Refill  . cetirizine (ZYRTEC) 10 MG tablet Take 10 mg by mouth daily.    [name] FLUoxetine (PROZAC) 20 MG capsule Take 20 mg by mouth daily.    . fluticasone (FLONASE) 50 MCG/ACT nasal spray Place 2 sprays into both nostrils daily as needed for allergies or rhinitis.    [name] glipiZIDE (GLUCOTROL) 5 MG tablet Take by mouth 2 (two) times daily before a meal.    .  lisinopril (PRINIVIL,ZESTRIL) 2.5 MG tablet Take 2.5 mg by mouth daily.    [name] LORazepam (ATIVAN) 0.5 MG tablet Take 0.5 mg by mouth at bedtime.     . pantoprazole (PROTONIX) 40 MG tablet Take 1 tablet (40 mg total) by mouth daily. 30 tablet 0  . pioglitazone (ACTOS) 15 MG tablet Take 15 mg by mouth daily.    . simvastatin (ZOCOR) 40 MG tablet Take 40 mg by mouth at bedtime.     . traZODone (DESYREL) 50 MG tablet Take 50 mg by mouth at bedtime.    [name] zolpidem (AMBIEN) 10 MG tablet Take 10 mg by mouth at bedtime.      Results for orders placed or performed during the hospital encounter of 10/04/16 (from the past 48 hour(s))  Glucose, capillary     Status: Abnormal   Collection Time: 10/04/16  9:47 AM  Result Value Ref Range   Glucose-Capillary 172 (H) 65 - 99 mg/dL   No results found.  ROS  Blood pressure 124/72, pulse 97, temperature 99.2 F (37.3 C), temperature source Oral, resp. rate 15, height 5\' 7"  (1.702 m), weight 182 lb (82.6 kg), last menstrual period 09/25/2011, SpO2 97 %. Physical Exam  Constitutional: She appears well-developed and well-nourished.  HENT:  Mouth/Throat: Oropharynx is clear and moist.  Eyes: Conjunctivae are  normal. No scleral icterus.  Neck: No thyromegaly present.  Cardiovascular: Normal rate, regular rhythm and normal heart sounds.   No murmur heard. Respiratory: Effort normal and breath sounds normal.  GI:  Abdomen is full. It is soft with mild tenderness at LLQ. No organomegaly or masses.  Musculoskeletal: She exhibits no edema.  Lymphadenopathy:    She has no cervical adenopathy.  Neurological: She is alert.  Skin: Skin is warm and dry.     Assessment/Plan History of colonic adenomas. Surveillance colonoscopy.  Hildred Laser, MD 10/04/2016, 10:01 AM

## 2016-10-09 ENCOUNTER — Encounter (HOSPITAL_COMMUNITY): Payer: Self-pay | Admitting: Internal Medicine

## 2017-04-01 ENCOUNTER — Other Ambulatory Visit (INDEPENDENT_AMBULATORY_CARE_PROVIDER_SITE_OTHER): Payer: Self-pay | Admitting: Internal Medicine

## 2017-05-06 ENCOUNTER — Other Ambulatory Visit (HOSPITAL_COMMUNITY): Payer: Self-pay | Admitting: Family Medicine

## 2017-05-06 DIAGNOSIS — Z1231 Encounter for screening mammogram for malignant neoplasm of breast: Secondary | ICD-10-CM

## 2017-06-14 ENCOUNTER — Ambulatory Visit (HOSPITAL_COMMUNITY)
Admission: RE | Admit: 2017-06-14 | Discharge: 2017-06-14 | Disposition: A | Discharge status: Home / Self Care | Payer: Medicaid Other | Source: Ambulatory Visit | Attending: Family Medicine | Admitting: Family Medicine

## 2017-06-14 DIAGNOSIS — Z1231 Encounter for screening mammogram for malignant neoplasm of breast: Secondary | ICD-10-CM | POA: Diagnosis not present

## 2017-09-30 ENCOUNTER — Other Ambulatory Visit (INDEPENDENT_AMBULATORY_CARE_PROVIDER_SITE_OTHER): Payer: Self-pay | Admitting: Internal Medicine

## 2018-04-29 ENCOUNTER — Encounter (HOSPITAL_COMMUNITY): Payer: Self-pay | Admitting: Emergency Medicine

## 2018-04-29 ENCOUNTER — Other Ambulatory Visit: Payer: Self-pay

## 2018-04-29 ENCOUNTER — Emergency Department (HOSPITAL_COMMUNITY): Payer: Medicaid Other

## 2018-04-29 ENCOUNTER — Inpatient Hospital Stay (HOSPITAL_COMMUNITY)
Admission: EM | Admit: 2018-04-29 | Discharge: 2018-05-06 | DRG: 572 | Disposition: A | Discharge status: Home Health | Payer: Medicaid Other | Attending: Internal Medicine | Admitting: Internal Medicine

## 2018-04-29 DIAGNOSIS — E876 Hypokalemia: Secondary | ICD-10-CM | POA: Diagnosis present

## 2018-04-29 DIAGNOSIS — N182 Chronic kidney disease, stage 2 (mild): Secondary | ICD-10-CM

## 2018-04-29 DIAGNOSIS — F411 Generalized anxiety disorder: Secondary | ICD-10-CM | POA: Diagnosis present

## 2018-04-29 DIAGNOSIS — K219 Gastro-esophageal reflux disease without esophagitis: Secondary | ICD-10-CM | POA: Diagnosis present

## 2018-04-29 DIAGNOSIS — E1122 Type 2 diabetes mellitus with diabetic chronic kidney disease: Secondary | ICD-10-CM

## 2018-04-29 DIAGNOSIS — Z7984 Long term (current) use of oral hypoglycemic drugs: Secondary | ICD-10-CM

## 2018-04-29 DIAGNOSIS — Z79899 Other long term (current) drug therapy: Secondary | ICD-10-CM | POA: Diagnosis not present

## 2018-04-29 DIAGNOSIS — I10 Essential (primary) hypertension: Secondary | ICD-10-CM | POA: Diagnosis present

## 2018-04-29 DIAGNOSIS — L0231 Cutaneous abscess of buttock: Secondary | ICD-10-CM | POA: Diagnosis present

## 2018-04-29 DIAGNOSIS — E119 Type 2 diabetes mellitus without complications: Secondary | ICD-10-CM | POA: Diagnosis present

## 2018-04-29 DIAGNOSIS — E7800 Pure hypercholesterolemia, unspecified: Secondary | ICD-10-CM | POA: Diagnosis present

## 2018-04-29 DIAGNOSIS — Z7951 Long term (current) use of inhaled steroids: Secondary | ICD-10-CM | POA: Diagnosis not present

## 2018-04-29 DIAGNOSIS — F17210 Nicotine dependence, cigarettes, uncomplicated: Secondary | ICD-10-CM | POA: Diagnosis present

## 2018-04-29 DIAGNOSIS — S31829A Unspecified open wound of left buttock, initial encounter: Secondary | ICD-10-CM | POA: Diagnosis present

## 2018-04-29 DIAGNOSIS — F329 Major depressive disorder, single episode, unspecified: Secondary | ICD-10-CM | POA: Diagnosis present

## 2018-04-29 DIAGNOSIS — L03317 Cellulitis of buttock: Secondary | ICD-10-CM | POA: Diagnosis present

## 2018-04-29 DIAGNOSIS — F419 Anxiety disorder, unspecified: Secondary | ICD-10-CM | POA: Diagnosis present

## 2018-04-29 LAB — CBC WITH DIFFERENTIAL/PLATELET
Abs Immature Granulocytes: 0.13 10*3/uL — ABNORMAL HIGH (ref 0.00–0.07)
BASOS PCT: 0 %
Basophils Absolute: 0 10*3/uL (ref 0.0–0.1)
EOS ABS: 0.2 10*3/uL (ref 0.0–0.5)
EOS PCT: 2 %
HCT: 37 % (ref 36.0–46.0)
Hemoglobin: 12.1 g/dL (ref 12.0–15.0)
Immature Granulocytes: 1 %
Lymphocytes Relative: 10 %
Lymphs Abs: 1.2 10*3/uL (ref 0.7–4.0)
MCH: 26.7 pg (ref 26.0–34.0)
MCHC: 32.7 g/dL (ref 30.0–36.0)
MCV: 81.7 fL (ref 80.0–100.0)
MONO ABS: 1.2 10*3/uL — AB (ref 0.1–1.0)
Monocytes Relative: 10 %
Neutro Abs: 9 10*3/uL — ABNORMAL HIGH (ref 1.7–7.7)
Neutrophils Relative %: 77 %
PLATELETS: 312 10*3/uL (ref 150–400)
RBC: 4.53 MIL/uL (ref 3.87–5.11)
RDW: 14.6 % (ref 11.5–15.5)
WBC: 11.7 10*3/uL — ABNORMAL HIGH (ref 4.0–10.5)
nRBC: 0 % (ref 0.0–0.2)

## 2018-04-29 LAB — HEMOGLOBIN A1C
Hgb A1c MFr Bld: 7.5 % — ABNORMAL HIGH (ref 4.8–5.6)
Mean Plasma Glucose: 168.55 mg/dL

## 2018-04-29 LAB — BASIC METABOLIC PANEL
ANION GAP: 11 (ref 5–15)
BUN: 31 mg/dL — ABNORMAL HIGH (ref 6–20)
CALCIUM: 8.8 mg/dL — AB (ref 8.9–10.3)
CO2: 26 mmol/L (ref 22–32)
Chloride: 100 mmol/L (ref 98–111)
Creatinine, Ser: 1.34 mg/dL — ABNORMAL HIGH (ref 0.44–1.00)
GFR, EST AFRICAN AMERICAN: 51 mL/min — AB (ref >60)
GFR, EST NON AFRICAN AMERICAN: 44 mL/min — AB (ref >60)
Glucose, Bld: 225 mg/dL — ABNORMAL HIGH (ref 70–99)
Potassium: 2.3 mmol/L — CL (ref 3.5–5.1)
Sodium: 137 mmol/L (ref 135–145)

## 2018-04-29 LAB — LACTIC ACID, PLASMA
LACTIC ACID, VENOUS: 1.1 mmol/L (ref 0.5–1.9)
LACTIC ACID, VENOUS: 1.1 mmol/L (ref 0.5–1.9)

## 2018-04-29 LAB — MAGNESIUM: MAGNESIUM: 1.8 mg/dL (ref 1.7–2.4)

## 2018-04-29 MED ORDER — ACETAMINOPHEN 325 MG PO TABS: 650.0000 mg | ORAL_TABLET | Freq: Four times a day (QID) | ORAL | Status: DC | PRN

## 2018-04-29 MED ORDER — ONDANSETRON HCL 4 MG/2ML IJ SOLN: 4.0000 mg | Freq: Four times a day (QID) | INTRAMUSCULAR | Status: DC | PRN

## 2018-04-29 MED ORDER — POTASSIUM CHLORIDE CRYS ER 20 MEQ PO TBCR
40.0000 meq | EXTENDED_RELEASE_TABLET | ORAL | Status: AC
  Administered 2018-04-29 (×2): 40 meq via ORAL
  Filled 2018-04-29 (×2): qty 2

## 2018-04-29 MED ORDER — LORAZEPAM 0.5 MG PO TABS: 0.5000 mg | ORAL_TABLET | Freq: Three times a day (TID) | ORAL | Status: DC | PRN

## 2018-04-29 MED ORDER — PIPERACILLIN-TAZOBACTAM 3.375 G IVPB 30 MIN
3.3750 g | Freq: Once | INTRAVENOUS | Status: AC
  Administered 2018-04-29: 3.375 g via INTRAVENOUS
  Filled 2018-04-29: qty 50

## 2018-04-29 MED ORDER — SIMVASTATIN 20 MG PO TABS
40.0000 mg | ORAL_TABLET | Freq: Every day | ORAL | Status: DC
  Administered 2018-04-29 – 2018-05-05 (×7): 40 mg via ORAL
  Filled 2018-04-29 (×6): qty 2
  Filled 2018-04-29: qty 4
  Filled 2018-04-29: qty 2

## 2018-04-29 MED ORDER — SODIUM CHLORIDE 0.9 % IV SOLN: INTRAVENOUS | Status: DC

## 2018-04-29 MED ORDER — PANTOPRAZOLE SODIUM 40 MG PO TBEC
40.0000 mg | DELAYED_RELEASE_TABLET | Freq: Every day | ORAL | Status: DC
  Administered 2018-04-30 – 2018-05-06 (×7): 40 mg via ORAL
  Filled 2018-04-29 (×7): qty 1

## 2018-04-29 MED ORDER — ACETAMINOPHEN 650 MG RE SUPP: 650.0000 mg | Freq: Four times a day (QID) | RECTAL | Status: DC | PRN

## 2018-04-29 MED ORDER — MORPHINE SULFATE (PF) 2 MG/ML IV SOLN
2.0000 mg | INTRAVENOUS | Status: DC | PRN
  Administered 2018-04-29 – 2018-05-01 (×6): 2 mg via INTRAVENOUS
  Filled 2018-04-29 (×6): qty 1

## 2018-04-29 MED ORDER — TRAZODONE HCL 50 MG PO TABS
50.0000 mg | ORAL_TABLET | Freq: Every day | ORAL | Status: DC
  Administered 2018-04-29 – 2018-05-05 (×7): 50 mg via ORAL
  Filled 2018-04-29 (×7): qty 1

## 2018-04-29 MED ORDER — VANCOMYCIN HCL IN DEXTROSE 1-5 GM/200ML-% IV SOLN
1000.0000 mg | Freq: Once | INTRAVENOUS | Status: AC
  Administered 2018-04-29: 1000 mg via INTRAVENOUS
  Filled 2018-04-29: qty 200

## 2018-04-29 MED ORDER — INSULIN ASPART 100 UNIT/ML ~~LOC~~ SOLN
0.0000 [IU] | SUBCUTANEOUS | Status: DC
  Administered 2018-04-29: 8 [IU] via SUBCUTANEOUS
  Administered 2018-04-30 (×3): 3 [IU] via SUBCUTANEOUS
  Filled 2018-04-29 (×2): qty 1

## 2018-04-29 MED ORDER — POTASSIUM CHLORIDE 10 MEQ/100ML IV SOLN
10.0000 meq | INTRAVENOUS | Status: AC
  Administered 2018-04-29 (×3): 10 meq via INTRAVENOUS
  Filled 2018-04-29 (×3): qty 100

## 2018-04-29 MED ORDER — FLUOXETINE HCL 20 MG PO CAPS
20.0000 mg | ORAL_CAPSULE | Freq: Every morning | ORAL | Status: DC
  Administered 2018-04-30 – 2018-05-06 (×7): 20 mg via ORAL
  Filled 2018-04-29 (×7): qty 1

## 2018-04-29 MED ORDER — PIPERACILLIN-TAZOBACTAM 3.375 G IVPB
3.3750 g | Freq: Three times a day (TID) | INTRAVENOUS | Status: DC
  Administered 2018-04-29 – 2018-05-04 (×15): 3.375 g via INTRAVENOUS
  Filled 2018-04-29 (×15): qty 50

## 2018-04-29 MED ORDER — POTASSIUM CHLORIDE IN NACL 40-0.9 MEQ/L-% IV SOLN
INTRAVENOUS | Status: AC
  Administered 2018-04-29 – 2018-04-30 (×2): 100 mL/h via INTRAVENOUS
  Filled 2018-04-29 (×3): qty 1000

## 2018-04-29 MED ORDER — DICYCLOMINE HCL 10 MG PO CAPS
10.0000 mg | ORAL_CAPSULE | Freq: Three times a day (TID) | ORAL | Status: DC
  Administered 2018-04-30 – 2018-05-06 (×20): 10 mg via ORAL
  Filled 2018-04-29 (×21): qty 1

## 2018-04-29 MED ORDER — ONDANSETRON HCL 4 MG PO TABS: 4.0000 mg | ORAL_TABLET | Freq: Four times a day (QID) | ORAL | Status: DC | PRN

## 2018-04-29 MED ORDER — LORATADINE 10 MG PO TABS
10.0000 mg | ORAL_TABLET | Freq: Every morning | ORAL | Status: DC
  Administered 2018-04-30 – 2018-05-06 (×7): 10 mg via ORAL
  Filled 2018-04-29 (×7): qty 1

## 2018-04-29 MED ORDER — MAGNESIUM SULFATE 2 GM/50ML IV SOLN
2.0000 g | Freq: Once | INTRAVENOUS | Status: AC
  Administered 2018-04-29: 2 g via INTRAVENOUS
  Filled 2018-04-29: qty 50

## 2018-04-29 MED ORDER — IOHEXOL 300 MG/ML  SOLN
75.0000 mL | Freq: Once | INTRAMUSCULAR | Status: AC | PRN
  Administered 2018-04-29: 75 mL via INTRAVENOUS

## 2018-04-29 NOTE — ED Notes (Signed)
Date and time results received: 04/29/18 1333 (use smartphrase ".now" to insert current time)  Test: k+ Critical Value: 2.3  Name of Provider Notified: [email or web address]  Orders Received? Or Actions Taken?: see chart

## 2018-04-29 NOTE — ED Provider Notes (Signed)
[facility] EMERGENCY DEPARTMENT Provider Note   CSN: [id number] Arrival date & time: 04/29/18  0630    History   Chief Complaint Chief Complaint  Patient presents with  . Abscess    HPI [name] is a 58 y.o. female.     HPI  Pt was seen at 1210. Per pt, c/o gradual onset and worsening of persistent left buttock "drainage" that began 4 days ago. Pt states she felt she "might have had another abscess" to her medial left buttock for the past 1 weeks. Pt states 4 days ago "it opened up" and "started to drain brown liquid." Pt denies injury, no rectal drainage, no N/V/D, no abd pain, no CP/SOB, no fevers.    Past Medical History:  Diagnosis Date  . Anal fissure   . Anxiety   . Depression   . Diabetes mellitus   . GERD (gastroesophageal reflux disease)   . Hypercholesteremia   . Hypertension   . Seizures ([place])    none since age 56-no meds since age 43 and no seizures since ge 6    Patient Active Problem List   Diagnosis Date Noted  . History of colonic polyps 06/14/2016  . Diverticulitis of colon (without mention of hemorrhage)(562.11) 05/18/2013  . Acute renal failure ([place]) 05/14/2013  . Hyperkalemia 05/14/2013  . Hypotension 05/14/2013  . Tobacco abuse 05/14/2013  . DM (diabetes mellitus) ([place]) 11/05/2011  . HTN (hypertension) 11/05/2011  . Diarrhea 11/05/2011  . Anal fissure 11/05/2011    Past Surgical History:  Procedure Laterality Date  . BACK SURGERY  2005   lumbar disckectomy  . COLONOSCOPY  10/01/2011   Procedure: COLONOSCOPY;  Surgeon: [name], MD;  [facility]: [facility];  Service: Endoscopy;  Laterality: N/A;  730  . COLONOSCOPY N/A 10/04/2016   Procedure: COLONOSCOPY;  Surgeon: [name], MD;  Location: [facility];  Service: Endoscopy;  Laterality: N/A;  10:30  . DILATION AND CURETTAGE OF UTERUS    . FOOT BONE EXCISION     right  . POLYPECTOMY  10/04/2016   Procedure: POLYPECTOMY;  Surgeon: [name], MD;  Location: [facility] ENDO  SUITE;  Service: Endoscopy;;  colon     OB History   No obstetric history on file.      Home Medications    Prior to Admission medications   Medication Sig Start Date End Date Taking? Authorizing Provider  cetirizine (ZYRTEC) 10 MG tablet Take 10 mg by mouth daily.    [provider]  dicyclomine (BENTYL) 10 MG capsule TAKE ONE CAPSULE BY MOUTH TWICE A DAY BEFORE A MEAL 09/30/17   Setzer, Terri L, NP  FLUoxetine (PROZAC) 20 MG capsule Take 20 mg by mouth daily.    [provider]  fluticasone (FLONASE) 50 MCG/ACT nasal spray Place 2 sprays into both nostrils daily as needed for allergies or rhinitis.    [provider]  glipiZIDE (GLUCOTROL) 5 MG tablet Take by mouth 2 (two) times daily before a meal.    [provider]  lisinopril (PRINIVIL,ZESTRIL) 2.5 MG tablet Take 2.5 mg by mouth daily.    [provider]  LORazepam (ATIVAN) 0.5 MG tablet Take 0.5 mg by mouth at bedtime.     [provider]  pantoprazole (PROTONIX) 40 MG tablet Take 1 tablet (40 mg total) by mouth daily. 05/19/13   Black, Lezlie Octave, NP  pioglitazone (ACTOS) 15 MG tablet Take 15 mg by mouth daily.    [provider]  simvastatin (ZOCOR) 40 MG tablet Take 40 mg by mouth at bedtime.     [provider]  traZODone (DESYREL) 50 MG tablet Take 50 mg by mouth at bedtime.    [provider]  zolpidem (AMBIEN) 10 MG tablet Take 10 mg by mouth at bedtime.    [provider]    Family History Family History  Problem Relation Age of Onset  . Arthritis Mother   . Hernia Mother   . Constipation Mother   . Colon cancer Neg Hx     Social History Social History   Tobacco Use  . Smoking status: Current Every Day Smoker    Packs/day: 0.50    Years: 37.00    Pack years: 18.50    Types: Cigarettes  . Smokeless tobacco: Never Used  Substance Use Topics  . Alcohol use: No  . Drug use: No     Allergies   Patient has no known  allergies.   Review of Systems Review of Systems ROS: Statement: All systems negative except as marked or noted in the HPI; Constitutional: Negative for fever and chills. ; ; Eyes: Negative for eye pain, redness and discharge. ; ; ENMT: Negative for ear pain, hoarseness, nasal congestion, sinus pressure and sore throat. ; ; Cardiovascular: Negative for chest pain, palpitations, diaphoresis, dyspnea and peripheral edema. ; ; Respiratory: Negative for cough, wheezing and stridor. ; ; Gastrointestinal: Negative for nausea, vomiting, diarrhea, abdominal pain, blood in stool, hematemesis, jaundice and rectal bleeding. . ; ; Genitourinary: Negative for dysuria, flank pain and hematuria. ; ; Musculoskeletal: Negative for back pain and neck pain. Negative for swelling and trauma.; ; Skin: Negative for pruritus, abrasions, blisters, bruising and +rash, skin lesion.; ; Neuro: Negative for headache, lightheadedness and neck stiffness. Negative for weakness, altered level of consciousness, altered mental status, extremity weakness, paresthesias, involuntary movement, seizure and syncope.       Physical Exam Updated Vital Signs BP 124/63 (BP Location: Right Arm)   Pulse (!) 101   Temp 97.9 F (36.6 C) (Temporal)   Resp 18   Ht 5\' 7"  (1.702 m)   Wt 84.8 kg   LMP 09/25/2011   SpO2 100%   BMI 29.29 kg/m   Physical Exam 1215: Physical examination:  Nursing notes reviewed; Vital signs and O2 SAT reviewed;  Constitutional: Well developed, Well nourished, Well hydrated, Uncomfortable appearing.; Head:  Normocephalic, atraumatic; Eyes: EOMI, PERRL, No scleral icterus; ENMT: Mouth and pharynx normal, Mucous membranes moist; Neck: Supple, Full range of motion, No lymphadenopathy; Cardiovascular: Regular rate and rhythm, No gallop; Respiratory: Breath sounds clear & equal bilaterally, No wheezes.  Speaking full sentences with ease, Normal respiratory effort/excursion; Chest: Nontender, Movement normal; Abdomen:  Soft, Nontender, Nondistended, Normal bowel sounds; Genitourinary: No CVA tenderness. +entire medial buttock with erythema and induration, mid-medial buttock with 2 areas of eschar and soft tissue crepitus, one of these is an open wound with foul smelling brown liquid drainage, no erythema to perineum or labia. Rectal exam with good tone, no palp masses or defects, no rectal drainage. +mild erythema to right medial buttock, no open wounds, no soft tissue crepitus.;;; Extremities: Peripheral pulses normal, No tenderness, No edema, No calf edema or asymmetry.; Neuro: AA&Ox3, Major CN grossly intact.  Speech clear. No gross focal motor or sensory deficits in extremities.; Skin: Color normal, Warm, Dry.     ED Treatments / Results  Labs (all labs ordered are listed, but only abnormal results are displayed)   EKG  None  Radiology   Procedures Procedures (including critical care time)  Medications Ordered in ED Medications  potassium chloride 10 mEq in 100 mL IVPB (10 mEq Intravenous New Bag/Given 04/29/18 1516)  vancomycin (VANCOCIN) IVPB 1000 mg/200 mL premix (has no administration in time range)  piperacillin-tazobactam (ZOSYN) IVPB 3.375 g (3.375 g Intravenous New Bag/Given 04/29/18 1517)  magnesium sulfate IVPB 2 g 50 mL (has no administration in time range)  0.9 %  sodium chloride infusion (has no administration in time range)  iohexol (OMNIPAQUE) 300 MG/ML solution 75 mL (75 mLs Intravenous Contrast Given 04/29/18 1445)     Initial Impression / Assessment and Plan / ED Course  I have reviewed the triage vital signs and the nursing notes.  Pertinent labs & imaging results that were available during my care of the patient were reviewed by me and considered in my medical decision making (see chart for details).     MDM Reviewed: previous chart, nursing note and vitals Reviewed previous: labs Interpretation: labs and CT scan Total time providing critical care: 30-74 minutes. This  excludes time spent performing separately reportable procedures and services. Consults: general surgery and admitting MD    CRITICAL CARE Performed by: Francine Graven Total critical care time: 35 minutes Critical care time was exclusive of separately billable procedures and treating other patients. Critical care was necessary to treat or prevent imminent or life-threatening deterioration. Critical care was time spent personally by me on the following activities: development of treatment plan with patient and/or surrogate as well as nursing, discussions with consultants, evaluation of patient's response to treatment, examination of patient, obtaining history from patient or surrogate, ordering and performing treatments and interventions, ordering and review of laboratory studies, ordering and review of radiographic studies, pulse oximetry and re-evaluation of patient's condition.   Results for orders placed or performed during the hospital encounter of 83/15/17  Basic metabolic panel  Result Value Ref Range   Sodium 137 135 - 145 mmol/L   Potassium 2.3 (LL) 3.5 - 5.1 mmol/L   Chloride 100 98 - 111 mmol/L   CO2 26 22 - 32 mmol/L   Glucose, Bld 225 (H) 70 - 99 mg/dL   BUN 31 (H) 6 - 20 mg/dL   Creatinine, Ser 1.34 (H) 0.44 - 1.00 mg/dL   Calcium 8.8 (L) 8.9 - 10.3 mg/dL   GFR calc non Af Amer 44 (L) >60 mL/min   GFR calc Af Amer 51 (L) >60 mL/min   Anion gap 11 5 - 15  Lactic acid, plasma  Result Value Ref Range   Lactic Acid, Venous 1.1 0.5 - 1.9 mmol/L  Lactic acid, plasma  Result Value Ref Range   Lactic Acid, Venous 1.1 0.5 - 1.9 mmol/L  CBC with Differential  Result Value Ref Range   WBC 11.7 (H) 4.0 - 10.5 K/uL   RBC 4.53 3.87 - 5.11 MIL/uL   Hemoglobin 12.1 12.0 - 15.0 g/dL   HCT 37.0 36.0 - 46.0 %   MCV 81.7 80.0 - 100.0 fL   MCH 26.7 26.0 - 34.0 pg   MCHC 32.7 30.0 - 36.0 g/dL   RDW 14.6 11.5 - 15.5 %   Platelets 312 150 - 400 K/uL   nRBC 0.0 0.0 - 0.2 %    Neutrophils Relative % 77 %   Neutro Abs 9.0 (H) 1.7 - 7.7 K/uL   Lymphocytes Relative 10 %   Lymphs Abs 1.2 0.7 - 4.0 K/uL   Monocytes Relative 10 %  Monocytes Absolute 1.2 (H) 0.1 - 1.0 K/uL   Eosinophils Relative 2 %   Eosinophils Absolute 0.2 0.0 - 0.5 K/uL   Basophils Relative 0 %   Basophils Absolute 0.0 0.0 - 0.1 K/uL   Immature Granulocytes 1 %   Abs Immature Granulocytes 0.13 (H) 0.00 - 0.07 K/uL  Magnesium  Result Value Ref Range   Magnesium 1.8 1.7 - 2.4 mg/dL   Ct Abdomen Pelvis W Contrast Result Date: 04/29/2018 CLINICAL DATA:  Buttock abscess for 1 week with brown discharge. History of diabetes. EXAM: CT ABDOMEN AND PELVIS WITH CONTRAST TECHNIQUE: Multidetector CT imaging of the abdomen and pelvis was performed using the standard protocol following bolus administration of intravenous contrast. CONTRAST:  98mL OMNIPAQUE IOHEXOL 300 MG/ML  SOLN COMPARISON:  CT abdomen and pelvis May 17, 2013 FINDINGS: LOWER CHEST: Dependent atelectasis. Heart size is mildly enlarged. No pericardial effusion. HEPATOBILIARY: Mild gallbladder distension with faintly dense cholelithiasis, unchanged. Partially calcified gallstone at gallbladder neck. No CT findings of acute cholecystitis. Negative liver. PANCREAS: Normal. SPLEEN: Normal. ADRENALS/URINARY TRACT: Kidneys are orthotopic, demonstrating symmetric enhancement. No nephrolithiasis, hydronephrosis or solid renal masses. Too small to characterize hypodensity upper pole LEFT kidney. The unopacified ureters are normal in course and caliber. Delayed imaging through the kidneys demonstrates symmetric prompt contrast excretion within the proximal urinary collecting system. Urinary bladder is partially distended and unremarkable. Normal adrenal glands. STOMACH/BOWEL: Severe colonic diverticulosis with sigmoid wall thickening. Small and large bowel normal course and caliber. Normal air-filled appendix. VASCULAR/LYMPHATIC: Aortoiliac vessels are normal in  course and caliber. Moderate intimal thickening calcific atherosclerosis. No lymphadenopathy by CT size criteria. REPRODUCTIVE: Normal. OTHER: No intraperitoneal free fluid or free air. MUSCULOSKELETAL: LEFT gluteal subcutaneous fat stranding, subcutaneous gas and skin thickening without drainable fluid collection. No intraperitoneal extension. Severe L5-S1 degenerative disc. IMPRESSION: 1. LEFT gluteal subcutaneous gas and cellulitis without drainable fluid collection. Anal fistula not demonstrated though not excluded, enteric contrast may be of added value. 2. Severe colonic diverticulosis without acute diverticulitis nor acute intra-abdominal/pelvic process. 3. Acute findings discussed with and reconfirmed by Dr.Anaiah Mcmannis on 04/29/2018 at 3:12 pm. Aortic Atherosclerosis (ICD10-I70.0). Electronically Signed   By: Elon Alas M.D.   On: 04/29/2018 15:13    Results for MADELYNE, MILLIKAN (MRN [id number]) as of 04/29/2018 15:37  Ref. Range 05/18/2013 05:27 05/19/2013 04:56 04/29/2018 12:29  BUN Latest Ref Range: 6 - 20 mg/dL 14 8 31  (H)  Creatinine Latest Ref Range: 0.44 - 1.00 mg/dL 1.26 (H) 1.29 (H) 1.34 (H)     1515:  BUN/Cr elevated from baseline; judicious IVF given. Potassium and magnesium repleted IV. IV abx started after [facility] obtained. T/C returned from General Surgery Dr. Arnoldo Morale, case discussed, including:  HPI, pertinent PM/SHx, VS/PE, dx testing, ED course and treatment:  Agreeable to consult tomorrow, requests to continue to replete potassium, likely to OR tomorrow, make NPO after midnight.  1545:  T/C returned from [facility] Dr. Denton Brick, case discussed, including:  HPI, pertinent PM/SHx, VS/PE, dx testing, ED course and treatment:  Agreeable to admit.    Final Clinical Impressions(s) / ED Diagnoses   Final diagnoses:  None    ED Discharge Orders    None       Francine Graven, DO 05/03/18 1741

## 2018-04-29 NOTE — Progress Notes (Signed)
Pharmacy Antibiotic Note  [name] is a 58 y.o. female admitted on 04/29/2018 with cellulitis.  Pharmacy has been consulted for zosyn dosing.  Plan: Zosyn 3.375g IV q8h (4 hour infusion).     Height: 5\' 7"  (170.2 cm) Weight: 187 lb (84.8 kg) IBW/kg (Calculated) : 61.6  Temp (24hrs), Avg:97.9 F (36.6 C), Min:97.9 F (36.6 C), Max:97.9 F (36.6 C)  Recent Labs  Lab 04/29/18 1229 04/29/18 1425  WBC 11.7*  --   CREATININE 1.34*  --   LATICACIDVEN 1.1 1.1    Estimated Creatinine Clearance: 52.5 mL/min (A) (by C-G formula based on SCr of 1.34 mg/dL (H)).    No Known Allergies  Antimicrobials this admission: 3/3 vancomycin x 1 3/3 zosyn >>   Microbiology results: 3/3 BCx: Sent   Thank you for allowing pharmacy to be a part of this patient's care.  Donna Christen Blakelee Allington 04/29/2018 6:39 PM

## 2018-04-29 NOTE — ED Triage Notes (Signed)
Pt states abscess to buttock crease for a week, started draining Saturday with a brown liquid. States she has had these abscesses before but they always resolve on their own.

## 2018-04-29 NOTE — ED Notes (Signed)
Patient transported to CT 

## 2018-04-29 NOTE — ED Notes (Signed)
ETA to CT is about 20 min.

## 2018-04-29 NOTE — H&P (Addendum)
History and Physical    ZION LINT JME:[id number] DOB: 1960/10/03 DOA: 04/29/2018  PCP: [name], MD   Patient coming from: Home  I have personally briefly reviewed patient's old medical records in [place]  Chief Complaint: Buttock Abscess  HPI: [name] is a 58 y.o. female with medical history significant for DM, hypertension, anal fissure, seizures, hypertension, depression, presented to the ED with complaints of left buttock swelling of about 1 week duration, she has not been able to sit on her left buttocks due to pain.  3 days ago she noticed draining from a wound on left buttock.  She has not tried any antibiotics for this.  No fever or chills. Reports similar occurrence but not this severe about 5 years ago, this drained on its own and resolved.  She did not need to seek medical care.  ED Course: Stable  vitals.  Hypokalemia 2.3.  Normal magnesium 1.8.  Normal lactic acid 1.1.  WBC 11.7.  CT abdomen and pelvis showed left gluteal subcutaneous gas and cellulitis without drainable fluid collection.  ED provider talked to Dr. Arnoldo Morale who will see patient in a.m.  Review of Systems: As per HPI all other systems reviewed and negative.  Past Medical History:  Diagnosis Date  . Anal fissure   . Anxiety   . Depression   . Diabetes mellitus   . GERD (gastroesophageal reflux disease)   . Hypercholesteremia   . Hypertension   . Seizures ([place])    none since age 4-no meds since age 9 and no seizures since ge 6    Past Surgical History:  Procedure Laterality Date  . BACK SURGERY  2005   lumbar disckectomy  . COLONOSCOPY  10/01/2011   Procedure: COLONOSCOPY;  Surgeon: [name], MD;  [facility];  Service: Endoscopy;  Laterality: N/A;  730  . COLONOSCOPY N/A 10/04/2016   Procedure: COLONOSCOPY;  Surgeon: [name], MD;  [facility];  Service: Endoscopy;  Laterality: N/A;  10:30  . DILATION AND CURETTAGE OF UTERUS    . FOOT BONE  EXCISION     right  . POLYPECTOMY  10/04/2016   Procedure: POLYPECTOMY;  Surgeon: [name], MD;  [facility];  Service: Endoscopy;;  colon     reports that she has been smoking cigarettes. She has a 18.50 pack-year smoking history. She has never used smokeless tobacco. She reports that she does not drink alcohol or use drugs.  No Known Allergies  Family History  Problem Relation Age of Onset  . Arthritis Mother   . Hernia Mother   . Constipation Mother   . Colon cancer Neg Hx     Prior to Admission medications   Medication Sig Start Date End Date Taking? Authorizing Provider  cetirizine (ZYRTEC) 10 MG tablet Take 10 mg by mouth daily.    [provider]  dicyclomine (BENTYL) 10 MG capsule TAKE ONE CAPSULE BY MOUTH TWICE A DAY BEFORE A MEAL 09/30/17   Setzer, Terri L, NP  FLUoxetine (PROZAC) 20 MG capsule Take 20 mg by mouth daily.    [provider]  fluticasone (FLONASE) 50 MCG/ACT nasal spray Place 2 sprays into both nostrils daily as needed for allergies or rhinitis.    [provider]  glipiZIDE (GLUCOTROL) 5 MG tablet Take by mouth 2 (two) times daily before a meal.    [provider]  lisinopril (PRINIVIL,ZESTRIL) 2.5 MG tablet Take 2.5 mg by  mouth daily.    [provider]  LORazepam (ATIVAN) 0.5 MG tablet Take 0.5 mg by mouth at bedtime.     [provider]  pantoprazole (PROTONIX) 40 MG tablet Take 1 tablet (40 mg total) by mouth daily. 05/19/13   Black, Lezlie Octave, NP  pioglitazone (ACTOS) 15 MG tablet Take 15 mg by mouth daily.    [provider]  simvastatin (ZOCOR) 40 MG tablet Take 40 mg by mouth at bedtime.     [provider]  traZODone (DESYREL) 50 MG tablet Take 50 mg by mouth at bedtime.    [provider]  zolpidem (AMBIEN) 10 MG tablet Take 10 mg by mouth at bedtime.    [provider]    Physical Exam: Vitals:   04/29/18 0938  BP: 124/63  Pulse: (!) 101    Resp: 18  Temp: 97.9 F (36.6 C)  TempSrc: Temporal  SpO2: 100%  Weight: 84.8 kg  Height: 5\' 7"  (1.702 m)    Constitutional: NAD, calm, comfortable Vitals:   04/29/18 0938  BP: 124/63  Pulse: (!) 101  Resp: 18  Temp: 97.9 F (36.6 C)  TempSrc: Temporal  SpO2: 100%  Weight: 84.8 kg  Height: 5\' 7"  (1.702 m)   Eyes: PERRL, lids and conjunctivae normal ENMT: Mucous membranes are dry. Posterior pharynx clear of any exudate or lesions. Poor dentition.  Neck: normal, supple, no masses, no thyromegaly Respiratory: clear to auscultation bilaterally, no wheezing, no crackles. Normal respiratory effort. No accessory muscle use.  Cardiovascular: Regular rate and rhythm, no murmurs / rubs / gallops. No extremity edema. 2+ pedal pulses. No carotid bruits.  Abdomen: no tenderness, no masses palpated. No hepatosplenomegaly. Bowel sounds positive.  Swelling erythema to left buttock more on medial surface towards gluteal cleft.  Tenderness. 2 wounds-appearing grey to black, on medial aspect black greenish-brown in fluid, foul odour. Musculoskeletal: no clubbing / cyanosis. No joint deformity upper and lower extremities. Good ROM, no contractures. Normal muscle tone.  Skin: no rashes, lesions, ulcers. No induration Neurologic: CN 2-12 grossly intact. Strength 5/5 in all 4.  Psychiatric: Normal judgment and insight. Alert and oriented x 3. Normal mood.        Labs on Admission: I have personally reviewed following labs and imaging studies  CBC: Recent Labs  Lab 04/29/18 1229  WBC 11.7*  NEUTROABS 9.0*  HGB 12.1  HCT 37.0  MCV 81.7  PLT 053   Basic Metabolic Panel: Recent Labs  Lab 04/29/18 1229  NA 137  K 2.3*  CL 100  CO2 26  GLUCOSE 225*  BUN 31*  CREATININE 1.34*  CALCIUM 8.8*  MG 1.8    Radiological Exams on Admission: Ct Abdomen Pelvis W Contrast  Result Date: 04/29/2018 CLINICAL DATA:  Buttock abscess for 1 week with brown discharge. History of diabetes. EXAM:  CT ABDOMEN AND PELVIS WITH CONTRAST TECHNIQUE: Multidetector CT imaging of the abdomen and pelvis was performed using the standard protocol following bolus administration of intravenous contrast. CONTRAST:  22mL OMNIPAQUE IOHEXOL 300 MG/ML  SOLN COMPARISON:  CT abdomen and pelvis May 17, 2013 FINDINGS: LOWER CHEST: Dependent atelectasis. Heart size is mildly enlarged. No pericardial effusion. HEPATOBILIARY: Mild gallbladder distension with faintly dense cholelithiasis, unchanged. Partially calcified gallstone at gallbladder neck. No CT findings of acute cholecystitis. Negative liver. PANCREAS: Normal. SPLEEN: Normal. ADRENALS/URINARY TRACT: Kidneys are orthotopic, demonstrating symmetric enhancement. No nephrolithiasis, hydronephrosis or solid renal masses. Too small to characterize hypodensity upper pole LEFT kidney. The unopacified ureters  are normal in course and caliber. Delayed imaging through the kidneys demonstrates symmetric prompt contrast excretion within the proximal urinary collecting system. Urinary bladder is partially distended and unremarkable. Normal adrenal glands. STOMACH/BOWEL: Severe colonic diverticulosis with sigmoid wall thickening. Small and large bowel normal course and caliber. Normal air-filled appendix. VASCULAR/LYMPHATIC: Aortoiliac vessels are normal in course and caliber. Moderate intimal thickening calcific atherosclerosis. No lymphadenopathy by CT size criteria. REPRODUCTIVE: Normal. OTHER: No intraperitoneal free fluid or free air. MUSCULOSKELETAL: LEFT gluteal subcutaneous fat stranding, subcutaneous gas and skin thickening without drainable fluid collection. No intraperitoneal extension. Severe L5-S1 degenerative disc. IMPRESSION: 1. LEFT gluteal subcutaneous gas and cellulitis without drainable fluid collection. Anal fistula not demonstrated though not excluded, enteric contrast may be of added value. 2. Severe colonic diverticulosis without acute diverticulitis nor acute  intra-abdominal/pelvic process. 3. Acute findings discussed with and reconfirmed by Dr.KATHLEEN MCMANUS on 04/29/2018 at 3:12 pm. Aortic Atherosclerosis (ICD10-I70.0). Electronically Signed   By: Elon Alas M.D.   On: 04/29/2018 15:13    EKG: None.   Assessment/Plan Active Problems:   Left buttock cellulitis  Left buttock cellulitis-history of anal fissure.  Rules out for sepsis.  WBC 11.7.  Normal lactic acid 1.1.  CT abdomen pelvis with contrast- LEFT gluteal subcutaneous gas and cellulitis without drainable fluid collection. Anal fistula not demonstrated though not excluded, enteric contrast may be of added value.  Vancomycin and Zosyn started in ED. - Continue IV Zosyn, pharmacy to dose. - Gen surgeon, Dr. Arnoldo Morale in a.m. - NPO mid night - CBC BMP a.m. - Morphine 2mg  PRN - N/s + 40KCL 100cc/hr x 15hrs  Hypokalemia-2.3.  Normal magnesium 1.8.  Possibly from drainage from wound. -Replete -BMP a.m.  Diabetes-random glucose 225. - HGbA1c -At home pioglitazone and glipizide - SSI  HTN-pressure soft systolic 17/51. -Hold home lisinopril - Hydrate  Anxiety, depression -Continue home Xanax, fluoxetine, trazodone nightly   HIV as part of routine health screening  DVT prophylaxis: Scds Code Status: Full Family Communication: Sister friend at bedside Disposition Plan:  Per rounding team Consults called: Gen Surgery, Dr Arnoldo Morale Admission status: Inpt, tele   Bethena Roys MD [facility]  04/29/2018, 5:48 PM

## 2018-04-30 ENCOUNTER — Other Ambulatory Visit: Payer: Self-pay

## 2018-04-30 ENCOUNTER — Encounter (HOSPITAL_COMMUNITY): Payer: Self-pay | Admitting: *Deleted

## 2018-04-30 DIAGNOSIS — E876 Hypokalemia: Secondary | ICD-10-CM

## 2018-04-30 DIAGNOSIS — L03317 Cellulitis of buttock: Secondary | ICD-10-CM

## 2018-04-30 DIAGNOSIS — S31829A Unspecified open wound of left buttock, initial encounter: Secondary | ICD-10-CM

## 2018-04-30 LAB — GLUCOSE, CAPILLARY
GLUCOSE-CAPILLARY: 246 mg/dL — AB (ref 70–99)
Glucose-Capillary: 184 mg/dL — ABNORMAL HIGH (ref 70–99)
Glucose-Capillary: 176 mg/dL — ABNORMAL HIGH (ref 70–99)
Glucose-Capillary: 265 mg/dL — ABNORMAL HIGH (ref 70–99)

## 2018-04-30 LAB — BASIC METABOLIC PANEL
ANION GAP: 7 (ref 5–15)
BUN: 23 mg/dL — ABNORMAL HIGH (ref 6–20)
CO2: 24 mmol/L (ref 22–32)
Calcium: 8.4 mg/dL — ABNORMAL LOW (ref 8.9–10.3)
Chloride: 110 mmol/L (ref 98–111)
Creatinine, Ser: 1.13 mg/dL — ABNORMAL HIGH (ref 0.44–1.00)
GFR calc Af Amer: >60 mL/min (ref >60)
GFR calc non Af Amer: 54 mL/min — ABNORMAL LOW (ref >60)
Glucose, Bld: 172 mg/dL — ABNORMAL HIGH (ref 70–99)
POTASSIUM: 3.3 mmol/L — AB (ref 3.5–5.1)
Sodium: 141 mmol/L (ref 135–145)

## 2018-04-30 LAB — CBG MONITORING, ED
Glucose-Capillary: 237 mg/dL — ABNORMAL HIGH (ref 70–99)
Glucose-Capillary: 169 mg/dL — ABNORMAL HIGH (ref 70–99)

## 2018-04-30 LAB — MAGNESIUM: MAGNESIUM: 2 mg/dL (ref 1.7–2.4)

## 2018-04-30 MED ORDER — POTASSIUM CHLORIDE 10 MEQ/100ML IV SOLN
10.0000 meq | INTRAVENOUS | Status: AC
  Administered 2018-04-30 (×3): 10 meq via INTRAVENOUS
  Filled 2018-04-30 (×3): qty 100

## 2018-04-30 MED ORDER — POTASSIUM CHLORIDE IN NACL 40-0.9 MEQ/L-% IV SOLN
INTRAVENOUS | Status: DC
  Administered 2018-04-30 – 2018-05-03 (×4): 35 mL/h via INTRAVENOUS

## 2018-04-30 MED ORDER — INSULIN ASPART 100 UNIT/ML ~~LOC~~ SOLN
0.0000 [IU] | Freq: Three times a day (TID) | SUBCUTANEOUS | Status: DC
  Administered 2018-04-30 – 2018-05-01 (×3): 5 [IU] via SUBCUTANEOUS
  Administered 2018-05-01: 8 [IU] via SUBCUTANEOUS
  Administered 2018-05-02: 5 [IU] via SUBCUTANEOUS
  Administered 2018-05-02: 8 [IU] via SUBCUTANEOUS
  Administered 2018-05-02: 11 [IU] via SUBCUTANEOUS
  Administered 2018-05-03 (×2): 5 [IU] via SUBCUTANEOUS
  Administered 2018-05-03: 8 [IU] via SUBCUTANEOUS
  Administered 2018-05-04: 3 [IU] via SUBCUTANEOUS
  Administered 2018-05-04: 5 [IU] via SUBCUTANEOUS
  Administered 2018-05-04: 8 [IU] via SUBCUTANEOUS
  Administered 2018-05-05: 3 [IU] via SUBCUTANEOUS
  Administered 2018-05-05: 5 [IU] via SUBCUTANEOUS
  Administered 2018-05-05 – 2018-05-06 (×3): 3 [IU] via SUBCUTANEOUS

## 2018-04-30 MED ORDER — POTASSIUM CHLORIDE CRYS ER 20 MEQ PO TBCR: 40.0000 meq | EXTENDED_RELEASE_TABLET | Freq: Once | ORAL | Status: DC

## 2018-04-30 MED ORDER — INSULIN ASPART 100 UNIT/ML ~~LOC~~ SOLN
0.0000 [IU] | Freq: Every day | SUBCUTANEOUS | Status: DC
  Administered 2018-04-30 – 2018-05-01 (×2): 3 [IU] via SUBCUTANEOUS
  Administered 2018-05-02 – 2018-05-05 (×4): 2 [IU] via SUBCUTANEOUS

## 2018-04-30 NOTE — Consult Note (Signed)
Reason for Consult: left buttock abscess Referring Physician: Dr. Elyse Jarvis [name] is an 58 y.o. female with past medical history significant for diabetes, hypertension, hypercholesterolemia, anxiety and depression. Patient presents with history of worsening pain and swelling to her left buttock for one week with persistent, brown drainage for the last 4 days. Patient reports severe pain that limited her activity at home was severely exacerbated with movement and touch. Patient reports treatment at home with salve, warm baths and OTC pain medications with no improvement, which finally prompted her to seek medical care.   Patient has a history of boils which she as been able to successfully manage at home.   Pain 9/10.   Past Medical History:  Diagnosis Date  . Anal fissure   . Anxiety   . Depression   . Diabetes mellitus   . GERD (gastroesophageal reflux disease)   . Hypercholesteremia   . Hypertension   . Seizures ([place])    none since age 70-no meds since age 68 and no seizures since ge 6    Past Surgical History:  Procedure Laterality Date  . BACK SURGERY  2005   lumbar disckectomy  . COLONOSCOPY  10/01/2011   Procedure: COLONOSCOPY;  Surgeon: [name], MD;  [facility];  Service: Endoscopy;  Laterality: N/A;  730  . COLONOSCOPY N/A 10/04/2016   Procedure: COLONOSCOPY;  Surgeon: [name], MD;  [facility];  Service: Endoscopy;  Laterality: N/A;  10:30  . DILATION AND CURETTAGE OF UTERUS    . FOOT BONE EXCISION     right  . POLYPECTOMY  10/04/2016   Procedure: POLYPECTOMY;  Surgeon: [name], MD;  [facility];  Service: Endoscopy;;  colon    Family History  Problem Relation Age of Onset  . Arthritis Mother   . Hernia Mother   . Constipation Mother   . Colon cancer Neg Hx     Social History:  reports that she has been smoking cigarettes. She has a 18.50 pack-year smoking history. She has never used smokeless  tobacco. She reports that she does not drink alcohol or use drugs.  Allergies: No Known Allergies  Medications: I have reviewed the patient's current medications.  Results for orders placed or performed during the hospital encounter of 04/29/18 (from the past 48 hour(s))  Basic metabolic panel     Status: Abnormal   Collection Time: 04/29/18 12:29 PM  Result Value Ref Range   Sodium 137 135 - 145 mmol/L   Potassium 2.3 (LL) 3.5 - 5.1 mmol/L    Comment: CRITICAL RESULT CALLED TO, READ BACK BY AND VERIFIED WITH: WINING[place]AM,C AT 1331 ON 3.3.20 BY ISLEY,B    Chloride 100 98 - 111 mmol/L   CO2 26 22 - 32 mmol/L   Glucose, Bld 225 (H) 70 - 99 mg/dL   BUN 31 (H) 6 - 20 mg/dL   Creatinine, Ser 1.34 (H) 0.44 - 1.00 mg/dL   Calcium 8.8 (L) 8.9 - 10.3 mg/dL   GFR calc non Af Amer 44 (L) >60 mL/min   GFR calc Af Amer 51 (L) >60 mL/min   Anion gap 11 5 - 15    Comment: Performed at [facility], [address], [place], [place] [address]  Lactic acid, plasma     Status: None   Collection Time: 04/29/18 12:29 PM  Result Value Ref Range   Lactic Acid, Venous 1.1 0.5 - 1.9 mmol/L    Comment:  Performed at [facility], [address] [place], [place] [address]  CBC with Differential     Status: Abnormal   Collection Time: 04/29/18 12:29 PM  Result Value Ref Range   WBC 11.7 (H) 4.0 - 10.5 K/uL   RBC 4.53 3.87 - 5.11 MIL/uL   Hemoglobin 12.1 12.0 - 15.0 g/dL   HCT 37.0 36.0 - 46.0 %   MCV 81.7 80.0 - 100.0 fL   MCH 26.7 26.0 - 34.0 pg   MCHC 32.7 30.0 - 36.0 g/dL   RDW 14.6 11.5 - 15.5 %   Platelets 312 150 - 400 K/uL   nRBC 0.0 0.0 - 0.2 %   Neutrophils Relative % 77 %   Neutro Abs 9.0 (H) 1.7 - 7.7 K/uL   Lymphocytes Relative 10 %   Lymphs Abs 1.2 0.7 - 4.0 K/uL   Monocytes Relative 10 %   Monocytes Absolute 1.2 (H) 0.1 - 1.0 K/uL   Eosinophils Relative 2 %   Eosinophils Absolute 0.2 0.0 - 0.5 K/uL   Basophils Relative 0 %   Basophils Absolute 0.0 0.0 - 0.1 K/uL   Immature  Granulocytes 1 %   Abs Immature Granulocytes 0.13 (H) 0.00 - 0.07 K/uL    Comment: Performed at [facility], [address] [place], [place] [address]  Magnesium     Status: None   Collection Time: 04/29/18 12:29 PM  Result Value Ref Range   Magnesium 1.8 1.7 - 2.4 mg/dL    Comment: Performed at [facility], [address] [place], [place] [address]  Hemoglobin A1c     Status: Abnormal   Collection Time: 04/29/18 12:29 PM  Result Value Ref Range   Hgb A1c MFr Bld 7.5 (H) 4.8 - 5.6 %    Comment: (NOTE) Pre diabetes:          5.7%-6.4% Diabetes:              >6.4% Glycemic control for   <7.0% adults with diabetes    Mean Plasma Glucose 168.55 mg/dL    Comment: Performed at [facility], [place] [address] [place], [place] [address]  Culture, blood (routine x 2)     Status: None (Preliminary result)   Collection Time: 04/29/18 12:33 PM  Result Value Ref Range   Specimen Description BLOOD LEFT ANTECUBITAL    Special Requests      BOTTLES DRAWN AEROBIC AND ANAEROBIC Blood Culture adequate volume   Culture      NO GROWTH < 24 HOURS Performed at [facility], [address] [place], [place] [address]    Report Status PENDING   Lactic acid, plasma     Status: None   Collection Time: 04/29/18  2:25 PM  Result Value Ref Range   Lactic Acid, Venous 1.1 0.5 - 1.9 mmol/L    Comment: Performed at [facility], [address] [place], [place] [address]  Culture, blood (routine x 2)     Status: None (Preliminary result)   Collection Time: 04/29/18  3:12 PM  Result Value Ref Range   Specimen Description BLOOD BLOOD RIGHT FOREARM    Special Requests      BOTTLES DRAWN AEROBIC AND ANAEROBIC Blood Culture adequate volume   Culture      NO GROWTH < 24 HOURS Performed at [facility], [address], [place], [place] [address]    Report Status PENDING   CBG monitoring, ED     Status: Abnormal   Collection Time: 04/29/18 11:15 PM  Result Value Ref Range   Glucose-Capillary 237 (H) 70 -  99 mg/dL  CBG monitoring, ED     Status: Abnormal   Collection Time: 04/30/18  4:12 AM  Result Value Ref Range   Glucose-Capillary 169 (H) 70 - 99 mg/dL  Basic metabolic panel     Status: Abnormal   Collection Time: 04/30/18  7:45 AM  Result Value Ref Range   Sodium 141 135 - 145 mmol/L   Potassium 3.3 (L) 3.5 - 5.1 mmol/L    Comment: DELTA CHECK NOTED   Chloride 110 98 - 111 mmol/L   CO2 24 22 - 32 mmol/L   Glucose, Bld 172 (H) 70 - 99 mg/dL   BUN 23 (H) 6 - 20 mg/dL   Creatinine, Ser 1.13 (H) 0.44 - 1.00 mg/dL   Calcium 8.4 (L) 8.9 - 10.3 mg/dL   GFR calc non Af Amer 54 (L) >60 mL/min   GFR calc Af Amer >60 >60 mL/min   Anion gap 7 5 - 15    Comment: Performed at [facility], [address] [place], [place] [address]  Magnesium     Status: None   Collection Time: 04/30/18  7:45 AM  Result Value Ref Range   Magnesium 2.0 1.7 - 2.4 mg/dL    Comment: Performed at [facility], [address], [place], [place] [address]  Glucose, capillary     Status: Abnormal   Collection Time: 04/30/18  8:31 AM  Result Value Ref Range   Glucose-Capillary 184 (H) 70 - 99 mg/dL    Ct Abdomen Pelvis W Contrast  Result Date: 04/29/2018 CLINICAL DATA:  Buttock abscess for 1 week with brown discharge. History of diabetes. EXAM: CT ABDOMEN AND PELVIS WITH CONTRAST TECHNIQUE: Multidetector CT imaging of the abdomen and pelvis was performed using the standard protocol following bolus administration of intravenous contrast. CONTRAST:  61mL OMNIPAQUE IOHEXOL 300 MG/ML  SOLN COMPARISON:  CT abdomen and pelvis May 17, 2013 FINDINGS: LOWER CHEST: Dependent atelectasis. Heart size is mildly enlarged. No pericardial effusion. HEPATOBILIARY: Mild gallbladder distension with faintly dense cholelithiasis, unchanged. Partially calcified gallstone at gallbladder neck. No CT findings of acute cholecystitis. Negative liver. PANCREAS: Normal. SPLEEN: Normal. ADRENALS/URINARY TRACT: Kidneys are orthotopic, demonstrating  symmetric enhancement. No nephrolithiasis, hydronephrosis or solid renal masses. Too small to characterize hypodensity upper pole LEFT kidney. The unopacified ureters are normal in course and caliber. Delayed imaging through the kidneys demonstrates symmetric prompt contrast excretion within the proximal urinary collecting system. Urinary bladder is partially distended and unremarkable. Normal adrenal glands. STOMACH/BOWEL: Severe colonic diverticulosis with sigmoid wall thickening. Small and large bowel normal course and caliber. Normal air-filled appendix. VASCULAR/LYMPHATIC: Aortoiliac vessels are normal in course and caliber. Moderate intimal thickening calcific atherosclerosis. No lymphadenopathy by CT size criteria. REPRODUCTIVE: Normal. OTHER: No intraperitoneal free fluid or free air. MUSCULOSKELETAL: LEFT gluteal subcutaneous fat stranding, subcutaneous gas and skin thickening without drainable fluid collection. No intraperitoneal extension. Severe L5-S1 degenerative disc. IMPRESSION: 1. LEFT gluteal subcutaneous gas and cellulitis without drainable fluid collection. Anal fistula not demonstrated though not excluded, enteric contrast may be of added value. 2. Severe colonic diverticulosis without acute diverticulitis nor acute intra-abdominal/pelvic process. 3. Acute findings discussed with and reconfirmed by Dr.KATHLEEN MCMANUS on 04/29/2018 at 3:12 pm. Aortic Atherosclerosis (ICD10-I70.0). Electronically Signed   By: Elon Alas M.D.   On: 04/29/2018 15:13    ROS:  Pertinent items are noted in HPI.  Blood pressure (!) 113/45, pulse 83, temperature 98.3 F (36.8 C),  temperature source Oral, resp. rate 16, height 5\' 7"  (1.702 m), weight 84.8 kg, last menstrual period 09/25/2011, SpO2 99 %. Physical Exam:  General: Anxious white female, lying in bed in NAD.  HEENT: normocephalic, atraumatic Skin: 6x4cm skin necrosis to left buttock. No stool noted in base of wound. No purulent drainage.  Granulation tissue present at base. No muscle exposed. Sharp debridement with scissors of necrotic skin performed. Wound packed with saline gauze. Patient tolerated procedure well.  Hospitalist H&P reviewed.   Assessment/Plan: Impression: Left buttock decubitus ulcer. No apparent fistula to rectum at the present time.  Plan: Continue IV antibiotics. Consult wound care team. May have regular diet.   Hien Cunliffe Fletcher Anon 04/30/2018, 11:15 AM

## 2018-04-30 NOTE — Progress Notes (Signed)
PROGRESS NOTE    [name]  ENI:[id number]  DOB: [birth year]  DOA: 04/29/2018 PCP: [name], MD   Brief Admission Hx: 58 y.o. female with medical history significant for DM, hypertension, anal fissure, seizures, hypertension, depression, presented to the ED with complaints of left buttock swelling of about 1 week duration, she has not been able to sit on her left buttocks due to pain.  MDM/Assessment & Plan:   1. Cellulitis left buttock - appreciate assistance of surgery team, continue IV antibiotics. Pt had some bedside debridement today and wound care consulted.    2. Hypokalemia - replacing magnesium and IV replacement ordered, recheck in AM.  3. Type 2 diabetes - follow A1c, continue sliding scale coverage.  4. Essential hypertension - soft blood pressures improved with hydration.  5. GAD - resumed home meds.    DVT prophylaxis: SCDs Code Status: Full  Family Communication: patient  Disposition Plan: inpatient for IV antibiotics    Consultants:  Surgery Arnoldo Morale)   Procedures:  Bedside debridement  Antimicrobials:  Zosyn 3/4 >>   Subjective: Pt reports severe pain in buttock  Objective: Vitals:   04/30/18 0615 04/30/18 0812 04/30/18 0852 04/30/18 1428  BP:  (!) 113/45  (!) 110/59  Pulse: 76 83  81  Resp:  16  16  Temp:  98.3 F (36.8 C)  98 F (36.7 C)  TempSrc:  Oral  Oral  SpO2: 95% 99%  95%  Weight:   84.8 kg   Height:   5\' 7"  (1.702 m)     Intake/Output Summary (Last 24 hours) at 04/30/2018 1625 Last data filed at 04/30/2018 1500 Gross per 24 hour  Intake 1105.27 ml  Output 350 ml  Net 755.27 ml   Filed Weights   04/29/18 0938 04/30/18 0852  Weight: 84.8 kg 84.8 kg     REVIEW OF SYSTEMS  As per history otherwise all reviewed and reported negative  Exam:  General exam: awake, alert, NAD. Foul smell. Respiratory system: Clear. No increased work of breathing. Cardiovascular system: S1 & S2 heard. No JVD, murmurs, gallops, clicks  or pedal edema. Gastrointestinal system: Abdomen is nondistended, soft and nontender. Normal bowel sounds heard. Central nervous system: Alert and oriented. No focal neurological deficits. Extremities: no cyanosis.  Data Reviewed: Basic Metabolic Panel: Recent Labs  Lab 04/29/18 1229 04/30/18 0745  NA 137 141  K 2.3* 3.3*  CL 100 110  CO2 26 24  GLUCOSE 225* 172*  BUN 31* 23*  CREATININE 1.34* 1.13*  CALCIUM 8.8* 8.4*  MG 1.8 2.0   Liver Function Tests: No results for input(s): AST, ALT, ALKPHOS, BILITOT, PROT, ALBUMIN in the last 168 hours. No results for input(s): LIPASE, AMYLASE in the last 168 hours. No results for input(s): AMMONIA in the last 168 hours. CBC: Recent Labs  Lab 04/29/18 1229  WBC 11.7*  NEUTROABS 9.0*  HGB 12.1  HCT 37.0  MCV 81.7  PLT 312   Cardiac Enzymes: No results for input(s): CKTOTAL, CKMB, CKMBINDEX, TROPONINI in the last 168 hours. CBG (last 3)  Recent Labs    04/30/18 0412 04/30/18 0831 04/30/18 1126  GLUCAP 169* 184* 176*   Recent Results (from the past 240 hour(s))  Culture, blood (routine x 2)     Status: None (Preliminary result)   Collection Time: 04/29/18 12:33 PM  Result Value Ref Range Status   Specimen Description BLOOD LEFT ANTECUBITAL  Final   Special Requests   Final    BOTTLES DRAWN AEROBIC AND  ANAEROBIC Blood Culture adequate volume   Culture   Final    NO GROWTH < 24 HOURS Performed at [facility], [address], [place], [place] [address]    Report Status PENDING  Incomplete  Culture, blood (routine x 2)     Status: None (Preliminary result)   Collection Time: 04/29/18  3:12 PM  Result Value Ref Range Status   Specimen Description BLOOD BLOOD RIGHT FOREARM  Final   Special Requests   Final    BOTTLES DRAWN AEROBIC AND ANAEROBIC Blood Culture adequate volume   Culture   Final    NO GROWTH < 24 HOURS Performed at [facility], [address], [place], [place] [address]    Report Status PENDING  Incomplete       Studies: Ct Abdomen Pelvis W Contrast  Result Date: 04/29/2018 CLINICAL DATA:  Buttock abscess for 1 week with brown discharge. History of diabetes. EXAM: CT ABDOMEN AND PELVIS WITH CONTRAST TECHNIQUE: Multidetector CT imaging of the abdomen and pelvis was performed using the standard protocol following bolus administration of intravenous contrast. CONTRAST:  59mL OMNIPAQUE IOHEXOL 300 MG/ML  SOLN COMPARISON:  CT abdomen and pelvis May 17, 2013 FINDINGS: LOWER CHEST: Dependent atelectasis. Heart size is mildly enlarged. No pericardial effusion. HEPATOBILIARY: Mild gallbladder distension with faintly dense cholelithiasis, unchanged. Partially calcified gallstone at gallbladder neck. No CT findings of acute cholecystitis. Negative liver. PANCREAS: Normal. SPLEEN: Normal. ADRENALS/URINARY TRACT: Kidneys are orthotopic, demonstrating symmetric enhancement. No nephrolithiasis, hydronephrosis or solid renal masses. Too small to characterize hypodensity upper pole LEFT kidney. The unopacified ureters are normal in course and caliber. Delayed imaging through the kidneys demonstrates symmetric prompt contrast excretion within the proximal urinary collecting system. Urinary bladder is partially distended and unremarkable. Normal adrenal glands. STOMACH/BOWEL: Severe colonic diverticulosis with sigmoid wall thickening. Small and large bowel normal course and caliber. Normal air-filled appendix. VASCULAR/LYMPHATIC: Aortoiliac vessels are normal in course and caliber. Moderate intimal thickening calcific atherosclerosis. No lymphadenopathy by CT size criteria. REPRODUCTIVE: Normal. OTHER: No intraperitoneal free fluid or free air. MUSCULOSKELETAL: LEFT gluteal subcutaneous fat stranding, subcutaneous gas and skin thickening without drainable fluid collection. No intraperitoneal extension. Severe L5-S1 degenerative disc. IMPRESSION: 1. LEFT gluteal subcutaneous gas and cellulitis without drainable fluid collection.  Anal fistula not demonstrated though not excluded, enteric contrast may be of added value. 2. Severe colonic diverticulosis without acute diverticulitis nor acute intra-abdominal/pelvic process. 3. Acute findings discussed with and reconfirmed by Dr.KATHLEEN MCMANUS on 04/29/2018 at 3:12 pm. Aortic Atherosclerosis (ICD10-I70.0). Electronically Signed   By: Elon Alas M.D.   On: 04/29/2018 15:13   Scheduled Meds: . dicyclomine  10 mg Oral TID AC  . FLUoxetine  20 mg Oral q morning - 10a  . insulin aspart  0-15 Units Subcutaneous TID WC  . insulin aspart  0-5 Units Subcutaneous QHS  . loratadine  10 mg Oral q morning - 10a  . pantoprazole  40 mg Oral Daily  . simvastatin  40 mg Oral QHS  . traZODone  50 mg Oral QHS   Continuous Infusions: . 0.9 % NaCl with KCl 40 mEq / L 35 mL/hr (04/30/18 1011)  . piperacillin-tazobactam (ZOSYN)  IV 3.375 g (04/30/18 1356)    Active Problems:   Left buttock abscess   Unspecified open wound of left buttock, initial encounter   Time spent:   Irwin Brakeman, MD [facility] 04/30/2018, 4:25 PM    LOS: 1 day  How to contact the [facility] Attending or Consulting provider 7A -  7P or covering provider during after hours [place], for this patient?  1. Check the care team in [facility] and look for a) attending/consulting [facility] provider listed and b) the [facility] team listed 2. Log into [email or web address] and use [place]'s universal password to access. If you do not have the password, please contact the hospital operator. 3. Locate the [facility] provider you are looking for under [facility] and page to a number that you can be directly reached. 4. If you still have difficulty reaching the provider, please page the [facility] (Director on Call) for the Hospitalists listed on amion for assistance.

## 2018-05-01 LAB — RENAL FUNCTION PANEL
Albumin: 2.1 g/dL — ABNORMAL LOW (ref 3.5–5.0)
Anion gap: 6 (ref 5–15)
BUN: 19 mg/dL (ref 6–20)
CO2: 25 mmol/L (ref 22–32)
CREATININE: 1.09 mg/dL — AB (ref 0.44–1.00)
Calcium: 8.4 mg/dL — ABNORMAL LOW (ref 8.9–10.3)
Chloride: 109 mmol/L (ref 98–111)
GFR calc Af Amer: >60 mL/min (ref >60)
GFR calc non Af Amer: 57 mL/min — ABNORMAL LOW (ref >60)
GLUCOSE: 262 mg/dL — AB (ref 70–99)
Phosphorus: 3.3 mg/dL (ref 2.5–4.6)
Potassium: 3.4 mmol/L — ABNORMAL LOW (ref 3.5–5.1)
Sodium: 140 mmol/L (ref 135–145)

## 2018-05-01 LAB — CBC WITH DIFFERENTIAL/PLATELET
Abs Immature Granulocytes: 0.43 10*3/uL — ABNORMAL HIGH (ref 0.00–0.07)
BASOS ABS: 0.1 10*3/uL (ref 0.0–0.1)
Basophils Relative: 1 %
Eosinophils Absolute: 0.4 10*3/uL (ref 0.0–0.5)
Eosinophils Relative: 5 %
HCT: 36 % (ref 36.0–46.0)
Hemoglobin: 11.1 g/dL — ABNORMAL LOW (ref 12.0–15.0)
Immature Granulocytes: 6 %
Lymphocytes Relative: 31 %
Lymphs Abs: 2.3 10*3/uL (ref 0.7–4.0)
MCH: 26.1 pg (ref 26.0–34.0)
MCHC: 30.8 g/dL (ref 30.0–36.0)
MCV: 84.5 fL (ref 80.0–100.0)
Monocytes Absolute: 0.9 10*3/uL (ref 0.1–1.0)
Monocytes Relative: 12 %
NRBC: 0 % (ref 0.0–0.2)
Neutro Abs: 3.5 10*3/uL (ref 1.7–7.7)
Neutrophils Relative %: 45 %
Platelets: 348 10*3/uL (ref 150–400)
RBC: 4.26 MIL/uL (ref 3.87–5.11)
RDW: 15.2 % (ref 11.5–15.5)
WBC: 7.6 10*3/uL (ref 4.0–10.5)

## 2018-05-01 LAB — GLUCOSE, CAPILLARY
GLUCOSE-CAPILLARY: 258 mg/dL — AB (ref 70–99)
Glucose-Capillary: 235 mg/dL — ABNORMAL HIGH (ref 70–99)
Glucose-Capillary: 227 mg/dL — ABNORMAL HIGH (ref 70–99)
Glucose-Capillary: 267 mg/dL — ABNORMAL HIGH (ref 70–99)

## 2018-05-01 LAB — MAGNESIUM: Magnesium: 1.8 mg/dL (ref 1.7–2.4)

## 2018-05-01 LAB — HIV ANTIBODY (ROUTINE TESTING W REFLEX): HIV Screen 4th Generation wRfx: NONREACTIVE

## 2018-05-01 MED ORDER — MORPHINE SULFATE (PF) 4 MG/ML IV SOLN
3.0000 mg | INTRAVENOUS | Status: DC | PRN
  Administered 2018-05-01 – 2018-05-06 (×10): 3 mg via INTRAVENOUS
  Filled 2018-05-01 (×10): qty 1

## 2018-05-01 MED ORDER — COLLAGENASE 250 UNIT/GM EX OINT
TOPICAL_OINTMENT | Freq: Every day | CUTANEOUS | Status: DC
  Administered 2018-05-01: 1 via TOPICAL
  Administered 2018-05-02 – 2018-05-06 (×5): via TOPICAL
  Filled 2018-05-01: qty 30

## 2018-05-01 MED ORDER — LORAZEPAM 0.5 MG PO TABS
0.2500 mg | ORAL_TABLET | Freq: Three times a day (TID) | ORAL | Status: DC | PRN
  Administered 2018-05-06: 0.25 mg via ORAL
  Filled 2018-05-01: qty 1

## 2018-05-01 MED ORDER — OXYCODONE HCL 5 MG PO TABS
5.0000 mg | ORAL_TABLET | ORAL | Status: DC | PRN
  Administered 2018-05-01 – 2018-05-05 (×13): 5 mg via ORAL
  Filled 2018-05-01 (×13): qty 1

## 2018-05-01 MED ORDER — INSULIN GLARGINE 100 UNIT/ML ~~LOC~~ SOLN
14.0000 [IU] | Freq: Every day | SUBCUTANEOUS | Status: DC
  Administered 2018-05-01 – 2018-05-02 (×2): 14 [IU] via SUBCUTANEOUS
  Filled 2018-05-01 (×6): qty 0.14

## 2018-05-01 NOTE — Progress Notes (Signed)
PROGRESS NOTE  [name]  XVQ:[id number]  DOB: 03-22-1960  DOA: 04/29/2018 PCP: [name], MD   Brief Admission Hx: 58 y.o. female with medical history significant for DM, hypertension, anal fissure, seizures, hypertension, depression, presented to the ED with complaints of left buttock swelling of about 1 week duration, she has not been able to sit on her left buttocks due to pain.  MDM/Assessment & Plan:   1. Cellulitis left buttock - appreciate assistance of surgery team, continue IV antibiotics. Pt had some bedside debridement today and wound care consulted.  Pt says pain uncontrolled, intensified pain management.  2. Hypokalemia - replacing magnesium and IV replacement ordered, recheck in AM.  3. Type 2 diabetes - a1c 7.5%, continue sliding scale coverage.  Added lantus daily.  4. Essential hypertension - soft blood pressures improved with hydration.  5. GAD - resumed home meds.    DVT prophylaxis: SCDs Code Status: Full  Family Communication: patient  Disposition Plan: inpatient for IV antibiotics   Consultants:  Surgery Arnoldo Morale)   Procedures:  Bedside debridement  Antimicrobials:  Zosyn 3/4 >>   Subjective: Pt reports pain in buttocks not controlled.   Objective: Vitals:   04/30/18 2023 04/30/18 2109 05/01/18 0636 05/01/18 1311  BP:  (!) 116/59 124/74 132/60  Pulse:  83 72 74  Resp:  16 16 16   Temp:  (!) 97.4 F (36.3 C) 97.8 F (36.6 C) 98 F (36.7 C)  TempSrc:  Oral Oral Oral  SpO2: 96% 97% 96% 96%  Weight:      Height:        Intake/Output Summary (Last 24 hours) at 05/01/2018 1334 Last data filed at 05/01/2018 1239 Gross per 24 hour  Intake 2026.94 ml  Output 800 ml  Net 1226.94 ml   Filed Weights   04/29/18 0938 04/30/18 0852  Weight: 84.8 kg 84.8 kg   REVIEW OF SYSTEMS  As per history otherwise all reviewed and reported negative  Exam:  General exam: awake, alert, NAD. Foul smell. Respiratory system: Clear. No increased work  of breathing. Cardiovascular system: S1 & S2 heard. No JVD, murmurs, gallops, clicks or pedal edema. Gastrointestinal system: Abdomen is nondistended, soft and nontender. Normal bowel sounds heard. Central nervous system: Alert and oriented. No focal neurological deficits. Extremities: no cyanosis.  Data Reviewed: Basic Metabolic Panel: Recent Labs  Lab 04/29/18 1229 04/30/18 0745 05/01/18 0406  NA 137 141 140  K 2.3* 3.3* 3.4*  CL 100 110 109  CO2 26 24 25   GLUCOSE 225* 172* 262*  BUN 31* 23* 19  CREATININE 1.34* 1.13* 1.09*  CALCIUM 8.8* 8.4* 8.4*  MG 1.8 2.0 1.8  PHOS  --   --  3.3   Liver Function Tests: Recent Labs  Lab 05/01/18 0406  ALBUMIN 2.1*   No results for input(s): LIPASE, AMYLASE in the last 168 hours. No results for input(s): AMMONIA in the last 168 hours. CBC: Recent Labs  Lab 04/29/18 1229 05/01/18 0406  WBC 11.7* 7.6  NEUTROABS 9.0* 3.5  HGB 12.1 11.1*  HCT 37.0 36.0  MCV 81.7 84.5  PLT 312 348   Cardiac Enzymes: No results for input(s): CKTOTAL, CKMB, CKMBINDEX, TROPONINI in the last 168 hours. CBG (last 3)  Recent Labs    04/30/18 2011 05/01/18 0808 05/01/18 1113  GLUCAP 265* 235* 227*   Recent Results (from the past 240 hour(s))  Culture, blood (routine x 2)     Status: None (Preliminary result)   Collection Time: 04/29/18 12:33  PM  Result Value Ref Range Status   Specimen Description BLOOD LEFT ANTECUBITAL  Final   Special Requests   Final    BOTTLES DRAWN AEROBIC AND ANAEROBIC Blood Culture adequate volume   Culture   Final    NO GROWTH 2 DAYS Performed at [facility], [address], [place], [place] [address]    Report Status PENDING  Incomplete  Culture, blood (routine x 2)     Status: None (Preliminary result)   Collection Time: 04/29/18  3:12 PM  Result Value Ref Range Status   Specimen Description BLOOD BLOOD RIGHT FOREARM  Final   Special Requests   Final    BOTTLES DRAWN AEROBIC AND ANAEROBIC Blood Culture adequate  volume   Culture   Final    NO GROWTH 2 DAYS Performed at [facility], [address] [place], [place] [address]    Report Status PENDING  Incomplete     Studies: Ct Abdomen Pelvis W Contrast  Result Date: 04/29/2018 CLINICAL DATA:  Buttock abscess for 1 week with brown discharge. History of diabetes. EXAM: CT ABDOMEN AND PELVIS WITH CONTRAST TECHNIQUE: Multidetector CT imaging of the abdomen and pelvis was performed using the standard protocol following bolus administration of intravenous contrast. CONTRAST:  19mL OMNIPAQUE IOHEXOL 300 MG/ML  SOLN COMPARISON:  CT abdomen and pelvis May 17, 2013 FINDINGS: LOWER CHEST: Dependent atelectasis. Heart size is mildly enlarged. No pericardial effusion. HEPATOBILIARY: Mild gallbladder distension with faintly dense cholelithiasis, unchanged. Partially calcified gallstone at gallbladder neck. No CT findings of acute cholecystitis. Negative liver. PANCREAS: Normal. SPLEEN: Normal. ADRENALS/URINARY TRACT: Kidneys are orthotopic, demonstrating symmetric enhancement. No nephrolithiasis, hydronephrosis or solid renal masses. Too small to characterize hypodensity upper pole LEFT kidney. The unopacified ureters are normal in course and caliber. Delayed imaging through the kidneys demonstrates symmetric prompt contrast excretion within the proximal urinary collecting system. Urinary bladder is partially distended and unremarkable. Normal adrenal glands. STOMACH/BOWEL: Severe colonic diverticulosis with sigmoid wall thickening. Small and large bowel normal course and caliber. Normal air-filled appendix. VASCULAR/LYMPHATIC: Aortoiliac vessels are normal in course and caliber. Moderate intimal thickening calcific atherosclerosis. No lymphadenopathy by CT size criteria. REPRODUCTIVE: Normal. OTHER: No intraperitoneal free fluid or free air. MUSCULOSKELETAL: LEFT gluteal subcutaneous fat stranding, subcutaneous gas and skin thickening without drainable fluid collection. No  intraperitoneal extension. Severe L5-S1 degenerative disc. IMPRESSION: 1. LEFT gluteal subcutaneous gas and cellulitis without drainable fluid collection. Anal fistula not demonstrated though not excluded, enteric contrast may be of added value. 2. Severe colonic diverticulosis without acute diverticulitis nor acute intra-abdominal/pelvic process. 3. Acute findings discussed with and reconfirmed by Dr.KATHLEEN MCMANUS on 04/29/2018 at 3:12 pm. Aortic Atherosclerosis (ICD10-I70.0). Electronically Signed   By: Elon Alas M.D.   On: 04/29/2018 15:13   Scheduled Meds: . dicyclomine  10 mg Oral TID AC  . FLUoxetine  20 mg Oral q morning - 10a  . insulin aspart  0-15 Units Subcutaneous TID WC  . insulin aspart  0-5 Units Subcutaneous QHS  . insulin glargine  14 Units Subcutaneous Daily  . loratadine  10 mg Oral q morning - 10a  . pantoprazole  40 mg Oral Daily  . simvastatin  40 mg Oral QHS  . traZODone  50 mg Oral QHS   Continuous Infusions: . 0.9 % NaCl with KCl 40 mEq / L 35 mL/hr (05/01/18 0352)  . piperacillin-tazobactam (ZOSYN)  IV 3.375 g (05/01/18 3235)    Principal Problem:   Unspecified open wound of left buttock, initial encounter  Active Problems:   DM (diabetes mellitus) ([place])   HTN (hypertension)   Left buttock abscess   Hypokalemia   Hypomagnesemia   Cellulitis of left buttock   Cellulitis of right buttock  Time spent:   Irwin Brakeman, MD [facility] 05/01/2018, 1:34 PM    LOS: 2 days  How to contact the [facility] Attending or Consulting provider [place] or covering provider during after hours [place], for this patient?  1. Check the care team in [facility] and look for a) attending/consulting [facility] provider listed and b) the [facility] team listed 2. Log into [email or web address] and use [place]'s universal password to access. If you do not have the password, please contact the hospital operator. 3. Locate the [facility] provider you are looking for under [facility] and page to a  number that you can be directly reached. 4. If you still have difficulty reaching the provider, please page the [facility] (Director on Call) for the Hospitalists listed on amion for assistance.

## 2018-05-01 NOTE — Consult Note (Signed)
[place] Nurse wound consult note Reason for Consult: buttock wound Wound type: full thickness ulceration, abscess.  S/P bedside debridement per surgery Pressure Injury POA: NA Measurement: 4cm x 3cm x 1.0c with tunneling (did not measure) Wound EBX:IDHWYS clean, wound edges are necrotic Drainage (amount, consistency, odor) moderate, yellow/green with odor Periwound:erthyma extends 2cm around open wound and induration extends aprox. 6cm  Dressing procedure/placement/frequency: Enzymatic debridement ointment to wound bed for selective debridement Pack with saline moist gauze.  Cover with foam or dry dressing. Change daily.   Needs HHRN to assist with dressing changes and to teach a CG. Patient reports to live alone.   I will discuss findings with Dr. Arnoldo Morale as well. Discussed POC with patient and bedside nurse.  Re consult if needed, will not follow at this time. Thanks  Lanasia Porras [employer], RN,CWOCN, CNS, [place] [phone])

## 2018-05-01 NOTE — Progress Notes (Signed)
Inpatient Diabetes Program Recommendations  AACE/ADA: New Consensus Statement on Inpatient Glycemic Control (2015)  Target Ranges:  Prepandial:   less than 140 mg/dL      Peak postprandial:   less than 180 mg/dL (1-2 hours)      Critically ill patients:  140 - 180 mg/dL   Lab Results  Component Value Date   GLUCAP 235 (H) 05/01/2018   HGBA1C 7.5 (H) 04/29/2018    Review of Glycemic Control Results for [name], [name] (MRN [id number]) as of 05/01/2018 08:49  Ref. Range 04/30/2018 08:31 04/30/2018 11:26 04/30/2018 16:47 04/30/2018 20:11 05/01/2018 08:08  Glucose-Capillary Latest Ref Range: 70 - 99 mg/dL 184 (H) 176 (H) 246 (H) 265 (H) 235 (H)   Diabetes history: DM2 Outpatient Diabetes medications: Actos 30 mg + Glucotrol 5 mg bid Current orders for Inpatient glycemic control: Novolog moderate tid + hs 0-5 units  Inpatient Diabetes Program Recommendations:   While oral DM medications on hold in the hospital: -Lantus 13 units daily (0.15 units/kg x 84.8 kg)  Thank you, Bethena Roys E. Lucious Zou, RN, MSN, CDE  Diabetes Coordinator Inpatient Glycemic Control Team Team Pager [phone] (8am-5pm) 05/01/2018 8:54 AM

## 2018-05-02 DIAGNOSIS — I10 Essential (primary) hypertension: Secondary | ICD-10-CM

## 2018-05-02 DIAGNOSIS — E119 Type 2 diabetes mellitus without complications: Secondary | ICD-10-CM

## 2018-05-02 DIAGNOSIS — E876 Hypokalemia: Secondary | ICD-10-CM

## 2018-05-02 DIAGNOSIS — S31829A Unspecified open wound of left buttock, initial encounter: Secondary | ICD-10-CM

## 2018-05-02 LAB — GLUCOSE, CAPILLARY
Glucose-Capillary: 220 mg/dL — ABNORMAL HIGH (ref 70–99)
Glucose-Capillary: 273 mg/dL — ABNORMAL HIGH (ref 70–99)
Glucose-Capillary: 311 mg/dL — ABNORMAL HIGH (ref 70–99)
Glucose-Capillary: 215 mg/dL — ABNORMAL HIGH (ref 70–99)

## 2018-05-02 MED ORDER — INSULIN GLARGINE 100 UNIT/ML ~~LOC~~ SOLN
20.0000 [IU] | Freq: Every day | SUBCUTANEOUS | Status: DC
  Administered 2018-05-03: 20 [IU] via SUBCUTANEOUS
  Filled 2018-05-02 (×3): qty 0.2

## 2018-05-02 NOTE — Progress Notes (Signed)
PROGRESS NOTE  HARVEST DEIST  OYD:[id number]  DOB: 04-Jan-1961  DOA: 04/29/2018 PCP: [name], MD   Brief Admission Hx: 58 y.o. female with medical history significant for DM, hypertension, anal fissure, seizures, hypertension, depression, presented to the ED with complaints of left buttock swelling of about 1 week duration, she has not been able to sit on her left buttocks due to pain.  MDM/Assessment & Plan:   1. Cellulitis left buttock - appreciate assistance of surgery team, continue IV antibiotics.  Patient underwent bedside debridement.  Continue wound care and pain management. 2. Hypokalemia - replacing magnesium and IV replacement ordered, recheck in AM.  3. Type 2 diabetes - a1c 7.5%, continue sliding scale coverage.  Blood sugars remain elevated.  Increase Lantus. 4. Essential hypertension - soft blood pressures improved with hydration.  5. GAD - resumed home meds.    DVT prophylaxis: SCDs Code Status: Full  Family Communication: patient  Disposition Plan: inpatient for IV antibiotics awaiting skilled nursing facility placement on discharge  Consultants:  Surgery Arnoldo Morale)   Procedures:  Bedside debridement  Antimicrobials:  Zosyn 3/4 >>   Subjective: Reports continued pain in buttocks during dressing changes.  Objective: Vitals:   05/01/18 0636 05/01/18 1311 05/01/18 2116 05/02/18 1334  BP: 124/74 132/60 (!) 143/68 138/70  Pulse: 72 74 72 76  Resp: 16 16 16 16   Temp: 97.8 F (36.6 C) 98 F (36.7 C) 98.3 F (36.8 C) 98.4 F (36.9 C)  TempSrc: Oral Oral Oral Oral  SpO2: 96% 96% 96% 96%  Weight:      Height:        Intake/Output Summary (Last 24 hours) at 05/02/2018 2029 Last data filed at 05/02/2018 1844 Gross per 24 hour  Intake 1538.47 ml  Output -  Net 1538.47 ml   Filed Weights   04/29/18 0938 04/30/18 0852  Weight: 84.8 kg 84.8 kg   REVIEW OF SYSTEMS  As per history otherwise all reviewed and reported negative  Exam:  General  exam: Alert, awake, oriented x 3 Respiratory system: Clear to auscultation. Respiratory effort normal. Cardiovascular system:RRR. No murmurs, rubs, gallops. Gastrointestinal system: Abdomen is nondistended, soft and nontender. No organomegaly or masses felt. Normal bowel sounds heard. Central nervous system: Alert and oriented. No focal neurological deficits. Extremities: No C/C/E, +pedal pulses Skin: Buttock wound with packing and surrounding erythema.  Overall appears to be improving. Psychiatry: Judgement and insight appear normal. Mood & affect appropriate.    Data Reviewed: Basic Metabolic Panel: Recent Labs  Lab 04/29/18 1229 04/30/18 0745 05/01/18 0406  NA 137 141 140  K 2.3* 3.3* 3.4*  CL 100 110 109  CO2 26 24 25   GLUCOSE 225* 172* 262*  BUN 31* 23* 19  CREATININE 1.34* 1.13* 1.09*  CALCIUM 8.8* 8.4* 8.4*  MG 1.8 2.0 1.8  PHOS  --   --  3.3   Liver Function Tests: Recent Labs  Lab 05/01/18 0406  ALBUMIN 2.1*   No results for input(s): LIPASE, AMYLASE in the last 168 hours. No results for input(s): AMMONIA in the last 168 hours. CBC: Recent Labs  Lab 04/29/18 1229 05/01/18 0406  WBC 11.7* 7.6  NEUTROABS 9.0* 3.5  HGB 12.1 11.1*  HCT 37.0 36.0  MCV 81.7 84.5  PLT 312 348   Cardiac Enzymes: No results for input(s): CKTOTAL, CKMB, CKMBINDEX, TROPONINI in the last 168 hours. CBG (last 3)  Recent Labs    05/02/18 0731 05/02/18 1109 05/02/18 1622  GLUCAP 220* 273* 311*  Recent Results (from the past 240 hour(s))  Culture, blood (routine x 2)     Status: None (Preliminary result)   Collection Time: 04/29/18 12:33 PM  Result Value Ref Range Status   Specimen Description BLOOD LEFT ANTECUBITAL  Final   Special Requests   Final    BOTTLES DRAWN AEROBIC AND ANAEROBIC Blood Culture adequate volume   Culture   Final    NO GROWTH 3 DAYS Performed at [facility], [address] [place], [place] [address]    Report Status PENDING  Incomplete  Culture,  blood (routine x 2)     Status: None (Preliminary result)   Collection Time: 04/29/18  3:12 PM  Result Value Ref Range Status   Specimen Description BLOOD BLOOD RIGHT FOREARM  Final   Special Requests   Final    BOTTLES DRAWN AEROBIC AND ANAEROBIC Blood Culture adequate volume   Culture   Final    NO GROWTH 3 DAYS Performed at [facility], [address] [place], [place] [address]    Report Status PENDING  Incomplete     Studies: No results found. Scheduled Meds: . collagenase   Topical Daily  . dicyclomine  10 mg Oral TID AC  . FLUoxetine  20 mg Oral q morning - 10a  . insulin aspart  0-15 Units Subcutaneous TID WC  . insulin aspart  0-5 Units Subcutaneous QHS  . insulin glargine  14 Units Subcutaneous Daily  . loratadine  10 mg Oral q morning - 10a  . pantoprazole  40 mg Oral Daily  . simvastatin  40 mg Oral QHS  . traZODone  50 mg Oral QHS   Continuous Infusions: . 0.9 % NaCl with KCl 40 mEq / L 35 mL/hr (05/02/18 0812)  . piperacillin-tazobactam (ZOSYN)  IV 3.375 g (05/02/18 1410)    Principal Problem:   Unspecified open wound of left buttock, initial encounter Active Problems:   DM (diabetes mellitus) ([facility])   HTN (hypertension)   Left buttock abscess   Hypokalemia   Hypomagnesemia   Cellulitis of left buttock   Cellulitis of right buttock  Time spent: 88mins  Kathie Dike, MD [facility] 05/02/2018, 8:29 PM    LOS: 3 days

## 2018-05-02 NOTE — Clinical Social Work Note (Signed)
Clinical Social Work Assessment  Patient Details  Name: [name] MRN: [id number] Date of Birth: 1960/11/03  Date of referral:  05/02/18               Reason for consult:  Facility Placement                Permission sought to share information with:    Permission granted to share information::     Name::     Kieth Brightly, Sister of patient  Agency::     Relationship::     Contact Information:     Housing/Transportation Living arrangements for the past 2 months:  Single Family Home Source of Information:  Patient Patient Interpreter Needed:  None Criminal Activity/Legal Involvement Pertinent to Current Situation/Hospitalization:  No - Comment as needed Significant Relationships:  Friend, Siblings Lives with:  Self Do you feel safe going back to the place where you live?  Yes Need for family participation in patient care:  Yes (Comment)  Care giving concerns:  None identified  Facilities manager / plan:  Met with patient and sister. Patient will need dressing changes with packing done daily. Patient lives alone. Her sister is unable to help due to her being very squeamish. Her friend is currently sick and can not help.  Patient interested in SNF placement for wound care under her Medicaid. She is aware she will be need to be there for 30 days. She is agreeable. She would like referrals to [place], [place], and [place].     Employment status:  Disabled (Comment on whether or not currently receiving Disability) Insurance information:    PT Recommendations:    Information / Referral to community resources:     Patient/Family's Response to care:  Patient and sister are very pleasant. Agreeable to SNF for needed wound care.   Patient/Family's Understanding of and Emotional Response to Diagnosis, Current Treatment, and Prognosis:  Patient and family are understanding of need for SNF for necessary wound as patient can not do wound changes due to location of  wound and packing needs.   Emotional Assessment Appearance:  Appears stated age Attitude/Demeanor/Rapport:  Engaged, Gracious Affect (typically observed):  Calm, Accepting Orientation:  Oriented to Self, Oriented to Place, Oriented to  Time, Oriented to Situation Alcohol / Substance use:  Tobacco Use Psych involvement (Current and /or in the community):  No (Comment)  Discharge Needs  Concerns to be addressed:  No discharge needs identified Readmission within the last 30 days:  No Current discharge risk:  None Barriers to Discharge:  [place] ([place])   Cid [employer], [name], RN 05/02/2018, 4:29 PM

## 2018-05-02 NOTE — Care Management (Addendum)
PASSR submitted and pending.She will not be able to DC to SNF without PASSR.

## 2018-05-02 NOTE — Progress Notes (Signed)
Inpatient Diabetes Program Recommendations  AACE/ADA: New Consensus Statement on Inpatient Glycemic Control (2015)  Target Ranges:  Prepandial:   less than 140 mg/dL      Peak postprandial:   less than 180 mg/dL (1-2 hours)      Critically ill patients:  140 - 180 mg/dL   Lab Results  Component Value Date   GLUCAP 220 (H) 05/02/2018   HGBA1C 7.5 (H) 04/29/2018    Review of Glycemic Control Results for DENAY, PLEITEZ (MRN [id number]) as of 05/02/2018 10:52  Ref. Range 05/01/2018 11:13 05/01/2018 16:30 05/01/2018 21:17 05/02/2018 07:31  Glucose-Capillary Latest Ref Range: 70 - 99 mg/dL 227 (H) 258 (H) 267 (H) 220 (H)   Diabetes history: DM2 Outpatient Diabetes medications: Actos 30 mg + Glucotrol 5 mg bid Current orders for Inpatient glycemic control: Novolog moderate tid + hs 0-5 units, Lantus 14 units QD  Inpatient Diabetes Program Recommendations:    If to remain inpatient, consider adding Novolog 3 units TID (assuming that patient is consuming >50% of meals).   Thanks, Bronson Curb, MSN, RNC-OB Diabetes Coordinator [phone] (8a-5p)

## 2018-05-02 NOTE — NC FL2 (Addendum)
[facility] LEVEL OF CARE SCREENING TOOL     IDENTIFICATION  Patient Name: [name] Birthdate: 10/19/1960 Sex: female Admission Date (Current Location): 04/29/2018  [place] and [place] Number:  [place] [facility] [id number] [place] and Address:  [facility] [address] [place]      Provider Number: [id number]  Attending Physician Name and Address:  Kathie Dike, MD  Relative Name and Phone Number:       Current Level of Care: Hospital Recommended Level of Care: [place] Prior Approval Number:    Date Approved/Denied:   PASRR Number:    Discharge Plan: SNF    Current Diagnoses: Patient Active Problem List   Diagnosis Date Noted  . Hypokalemia 04/30/2018  . Hypomagnesemia 04/30/2018  . Cellulitis of left buttock 04/30/2018  . Cellulitis of right buttock 04/30/2018  . Unspecified open wound of left buttock, initial encounter   . Left buttock abscess 04/29/2018  . History of colonic polyps 06/14/2016  . Diverticulitis of colon (without mention of hemorrhage)(562.11) 05/18/2013  . Acute renal failure ([place]) 05/14/2013  . Hyperkalemia 05/14/2013  . Hypotension 05/14/2013  . Tobacco abuse 05/14/2013  . DM (diabetes mellitus) ([place]) 11/05/2011  . HTN (hypertension) 11/05/2011  . Diarrhea 11/05/2011  . Anal fissure 11/05/2011    Orientation RESPIRATION BLADDER Height & Weight     Self, Time, Situation, Place  Normal Continent Weight: 84.8 kg Height:  5\' 7"  (170.2 cm)  BEHAVIORAL SYMPTOMS/MOOD NEUROLOGICAL BOWEL NUTRITION STATUS      Continent Diet(carb modiified)  AMBULATORY STATUS COMMUNICATION OF NEEDS Skin   Independent Verbally Other (Comment)(non pressure wound on left buttock, 7 X 3 CM; 2 areas that tunnel)                       Personal Care Assistance Level of Assistance  Bathing, Feeding, Dressing Bathing Assistance: Independent Feeding assistance: Independent Dressing Assistance:  Independent     Functional Limitations Info  Sight, Hearing, Speech Sight Info: Adequate Hearing Info: Adequate Speech Info: Adequate    SPECIAL CARE FACTORS FREQUENCY                       Contractures Contractures Info: Not present    Additional Factors Info  Code Status, Allergies Code Status Info: Full code Allergies Info: No Known allergies Psychotropic Info: Prozac, trazodone         Current Medications (05/02/2018):  This is the current hospital active medication list Current Facility-Administered Medications  Medication Dose Route Frequency Provider Last Rate Last Dose  . 0.9 % NaCl with KCl 40 mEq / L  infusion   Intravenous Continuous Johnson, Clanford L, MD 35 mL/hr at 05/02/18 0812 35 mL/hr at 05/02/18 3536  . acetaminophen (TYLENOL) tablet 650 mg  650 mg Oral Q6H PRN Emokpae, Ejiroghene E, MD       Or  . acetaminophen (TYLENOL) suppository 650 mg  650 mg Rectal Q6H PRN Emokpae, Ejiroghene E, MD      . collagenase (SANTYL) ointment   Topical Daily Aviva Signs, MD      . dicyclomine (BENTYL) capsule 10 mg  10 mg Oral TID AC Emokpae, Ejiroghene E, MD   10 mg at 05/02/18 1136  . FLUoxetine (PROZAC) capsule 20 mg  20 mg Oral q morning - 10a Emokpae, Ejiroghene E, MD   20 mg at 05/02/18 0811  . insulin aspart (novoLOG) injection 0-15 Units  0-15 Units Subcutaneous TID  WC Johnson, Clanford L, MD   8 Units at 05/02/18 1136  . insulin aspart (novoLOG) injection 0-5 Units  0-5 Units Subcutaneous QHS Irwin Brakeman L, MD   3 Units at 05/01/18 2311  . insulin glargine (LANTUS) injection 14 Units  14 Units Subcutaneous Daily Irwin Brakeman L, MD   14 Units at 05/02/18 0959  . loratadine (CLARITIN) tablet 10 mg  10 mg Oral q morning - 10a Emokpae, Ejiroghene E, MD   10 mg at 05/02/18 0811  . LORazepam (ATIVAN) tablet 0.25 mg  0.25 mg Oral TID PRN Johnson, Clanford L, MD      . morphine 4 MG/ML injection 3 mg  3 mg Intravenous Q4H PRN Johnson, Clanford L, MD   3 mg  at 05/02/18 1136  . ondansetron (ZOFRAN) tablet 4 mg  4 mg Oral Q6H PRN Emokpae, Ejiroghene E, MD       Or  . ondansetron (ZOFRAN) injection 4 mg  4 mg Intravenous Q6H PRN Emokpae, Ejiroghene E, MD      . oxyCODONE (Oxy IR/ROXICODONE) immediate release tablet 5 mg  5 mg Oral Q4H PRN Johnson, Clanford L, MD   5 mg at 05/02/18 1000  . pantoprazole (PROTONIX) EC tablet 40 mg  40 mg Oral Daily Emokpae, Ejiroghene E, MD   40 mg at 05/02/18 0810  . piperacillin-tazobactam (ZOSYN) IVPB 3.375 g  3.375 g Intravenous Q8H Coffee, Donna Christen, RPH 12.5 mL/hr at 05/02/18 1410 3.375 g at 05/02/18 1410  . simvastatin (ZOCOR) tablet 40 mg  40 mg Oral QHS Emokpae, Ejiroghene E, MD   40 mg at 05/01/18 2059  . traZODone (DESYREL) tablet 50 mg  50 mg Oral QHS Emokpae, Ejiroghene E, MD   50 mg at 05/01/18 2059     Discharge Medications: Please see discharge summary for a list of discharge medications.  Relevant Imaging Results:  Relevant Lab Results:   Additional Information SS# [id number]; patient will need SNF for wound care, will need daily dressing changes and will have packing in wound  Cordelro Gautreau, [name], RN

## 2018-05-02 NOTE — Care Management Note (Signed)
Case Management Note  Patient Details  Name: [name] MRN: [id number] Date of Birth: Jun 27, 1960  Subjective/Objective:   Left Buttock wound. Has had debridement. Seen by [facility] RN. [place] RN suggested.  Discussed with patient and sister that home heath RN will only come out a couple out of times a week and that if daily dressing changes are needed she will need help of a friend or family. Sister states she is unable as she is too squeamish. She would not even look at wound with me.  She has a friend that is sick currently and can not help.      Action/Plan: Patient interested in placement under medicaid for wound care. CSW notified.   Expected Discharge Date:  05/02/18               Expected Discharge Plan:  Skilled Nursing Facility  In-House Referral:  Clinical Social Work  Discharge planning Services  CM Consult  Post Acute Care Choice:    Choice offered to:  Patient  DME Arranged:    DME Agency:     HH Arranged:    [place] Agency:     Status of Service:  In process, will continue to follow  If discussed at Long Length of Stay Meetings, dates discussed:    Additional Comments:  Aleksandra Raben, [name], RN 05/02/2018, 12:49 PM

## 2018-05-02 NOTE — Clinical Social Work Placement (Signed)
   CLINICAL SOCIAL WORK PLACEMENT  NOTE  Date:  05/02/2018  Patient Details  Name: [name] MRN: [id number] Date of Birth: August 11, 1960  Clinical Social Work is seeking post-discharge placement for this patient at the [place] level of care (*CSW will initial, date and re-position this form in  chart as items are completed):  Yes   Patient/family provided with [place] Work Department's list of facilities offering this level of care within the geographic area requested by the patient (or if unable, by the patient's family).  Yes   Patient/family informed of their freedom to choose among providers that offer the needed level of care, that participate in Medicare, Medicaid or managed care program needed by the patient, have an available bed and are willing to accept the patient.  Yes   Patient/family informed of [place]'s ownership interest in [facility] and [facility], as well as of the fact that they are under no obligation to receive care at these facilities.  PASRR submitted to EDS on 05/02/18     PASRR number received on       Existing PASRR number confirmed on       FL2 transmitted to all facilities in geographic area requested by pt/family on 05/02/18     FL2 transmitted to all facilities within larger geographic area on 05/02/18     Patient informed that his/her managed care company has contracts with or will negotiate with certain facilities, including the following:            Patient/family informed of bed offers received.  Patient chooses bed at       Physician recommends and patient chooses bed at      Patient to be transferred to   on  .  Patient to be transferred to facility by       Patient family notified on   of transfer.  Name of family member notified:        PHYSICIAN       Additional Comment:    _______________________________________________ Nickola Major, RN 05/02/2018, 4:21 PM

## 2018-05-03 LAB — BASIC METABOLIC PANEL
Anion gap: 7 (ref 5–15)
BUN: 12 mg/dL (ref 6–20)
CO2: 29 mmol/L (ref 22–32)
Calcium: 8.4 mg/dL — ABNORMAL LOW (ref 8.9–10.3)
Chloride: 104 mmol/L (ref 98–111)
Creatinine, Ser: 1.01 mg/dL — ABNORMAL HIGH (ref 0.44–1.00)
GFR calc Af Amer: >60 mL/min (ref >60)
GFR calc non Af Amer: >60 mL/min (ref >60)
Glucose, Bld: 216 mg/dL — ABNORMAL HIGH (ref 70–99)
Potassium: 3.8 mmol/L (ref 3.5–5.1)
Sodium: 140 mmol/L (ref 135–145)

## 2018-05-03 LAB — GLUCOSE, CAPILLARY
GLUCOSE-CAPILLARY: 210 mg/dL — AB (ref 70–99)
Glucose-Capillary: 230 mg/dL — ABNORMAL HIGH (ref 70–99)
Glucose-Capillary: 285 mg/dL — ABNORMAL HIGH (ref 70–99)
Glucose-Capillary: 229 mg/dL — ABNORMAL HIGH (ref 70–99)

## 2018-05-03 MED ORDER — LISINOPRIL 5 MG PO TABS
5.0000 mg | ORAL_TABLET | Freq: Every day | ORAL | Status: DC
  Administered 2018-05-03 – 2018-05-06 (×4): 5 mg via ORAL
  Filled 2018-05-03 (×4): qty 1

## 2018-05-03 MED ORDER — INSULIN GLARGINE 100 UNIT/ML ~~LOC~~ SOLN
25.0000 [IU] | Freq: Every day | SUBCUTANEOUS | Status: DC
  Administered 2018-05-04 – 2018-05-06 (×3): 25 [IU] via SUBCUTANEOUS
  Filled 2018-05-03 (×4): qty 0.25

## 2018-05-03 MED ORDER — FLUCONAZOLE 150 MG PO TABS
150.0000 mg | ORAL_TABLET | Freq: Once | ORAL | Status: AC
  Administered 2018-05-03: 150 mg via ORAL
  Filled 2018-05-03: qty 1

## 2018-05-03 NOTE — Progress Notes (Signed)
LCSW was called by patients RN to see when this patient will receive a Passr, LCSW read the previous note from weekday SW and its likely that a PASSR number " may " be issued on Monday.  [employer] LCSW [phone]

## 2018-05-03 NOTE — Progress Notes (Signed)
Pharmacy Antibiotic Note  [name] is a 58 y.o. female admitted on 04/29/2018 with cellulitis.  Pharmacy has been consulted for zosyn dosing.  Plan: Continue Zosyn 3.375g IV every 8 hours. Monitor labs, c/s, and patient improvement.  Height: 5\' 7"  (170.2 cm) Weight: 186 lb 15.2 oz (84.8 kg) IBW/kg (Calculated) : 61.6  Temp (24hrs), Avg:98.3 F (36.8 C), Min:97.8 F (36.6 C), Max:98.6 F (37 C)  Recent Labs  Lab 04/29/18 1229 04/29/18 1425 04/30/18 0745 05/01/18 0406 05/03/18 0623  WBC 11.7*  --   --  7.6  --   CREATININE 1.34*  --  1.13* 1.09* 1.01*  LATICACIDVEN 1.1 1.1  --   --   --     Estimated Creatinine Clearance: 69.6 mL/min (A) (by C-G formula based on SCr of 1.01 mg/dL (H)).    No Known Allergies  Antimicrobials this admission: 3/3 vancomycin x 1 3/3 zosyn >>   Microbiology results: 3/3 BCx: ngtd  Thank you for allowing pharmacy to be a part of this patient's care.  Ramond Craver 05/03/2018 11:18 AM

## 2018-05-03 NOTE — Progress Notes (Signed)
PROGRESS NOTE  [name]  HMC:[id number]  DOB: [birth year]  DOA: 04/29/2018 PCP: [name], MD   Brief Admission Hx: 58 y.o. female with medical history significant for DM, hypertension, anal fissure, seizures, hypertension, depression, presented to the ED with complaints of left buttock swelling of about 1 week duration, she has not been able to sit on her left buttocks due to pain.  MDM/Assessment & Plan:   1. Cellulitis left buttock - appreciate assistance of surgery team, continue IV antibiotics.  Patient underwent bedside debridement.  Continue wound care and pain management. 2. Hypokalemia - replaced.  3. Type 2 diabetes - a1c 7.5%, continue sliding scale coverage.  Blood sugars remain elevated.  Increase Lantus. 4. Essential hypertension -blood pressure stable.  Restart on lisinopril. 5. GAD - resumed home meds.    DVT prophylaxis: SCDs Code Status: Full  Family Communication: patient  Disposition Plan: inpatient for IV antibiotics awaiting skilled nursing facility placement on discharge  Consultants:  Surgery Arnoldo Morale)   Procedures:  Bedside debridement  Antimicrobials:  Zosyn 3/4 >>   Subjective: She reports pain in her buttock since her dressing was recently changed.  No other complaints.  Objective: Vitals:   05/02/18 1334 05/02/18 2108 05/03/18 0607 05/03/18 1422  BP: 138/70 (!) 151/78 137/75 (!) 144/69  Pulse: 76 76 74 73  Resp: 16 16 16 16   Temp: 98.4 F (36.9 C) 98.6 F (37 C) 97.8 F (36.6 C) 98.2 F (36.8 C)  TempSrc: Oral Oral Oral Oral  SpO2: 96% 97% 97% 97%  Weight:      Height:        Intake/Output Summary (Last 24 hours) at 05/03/2018 1614 Last data filed at 05/03/2018 1423 Gross per 24 hour  Intake 720 ml  Output -  Net 720 ml   Filed Weights   04/29/18 0938 04/30/18 0852  Weight: 84.8 kg 84.8 kg   REVIEW OF SYSTEMS  As per history otherwise all reviewed and reported negative  Exam:  General exam: Alert, awake,  oriented x 3 Respiratory system: Clear to auscultation. Respiratory effort normal. Cardiovascular system:RRR. No murmurs, rubs, gallops. Gastrointestinal system: Abdomen is nondistended, soft and nontender. No organomegaly or masses felt. Normal bowel sounds heard. Central nervous system: Alert and oriented. No focal neurological deficits. Extremities: No C/C/E, +pedal pulses Skin: Wound on left buttocks, surrounding erythema improving, packing in place Psychiatry: Judgement and insight appear normal. Mood & affect appropriate.    Data Reviewed: Basic Metabolic Panel: Recent Labs  Lab 04/29/18 1229 04/30/18 0745 05/01/18 0406 05/03/18 0623  NA 137 141 140 140  K 2.3* 3.3* 3.4* 3.8  CL 100 110 109 104  CO2 26 24 25 29   GLUCOSE 225* 172* 262* 216*  BUN 31* 23* 19 12  CREATININE 1.34* 1.13* 1.09* 1.01*  CALCIUM 8.8* 8.4* 8.4* 8.4*  MG 1.8 2.0 1.8  --   PHOS  --   --  3.3  --    Liver Function Tests: Recent Labs  Lab 05/01/18 0406  ALBUMIN 2.1*   No results for input(s): LIPASE, AMYLASE in the last 168 hours. No results for input(s): AMMONIA in the last 168 hours. CBC: Recent Labs  Lab 04/29/18 1229 05/01/18 0406  WBC 11.7* 7.6  NEUTROABS 9.0* 3.5  HGB 12.1 11.1*  HCT 37.0 36.0  MCV 81.7 84.5  PLT 312 348   Cardiac Enzymes: No results for input(s): CKTOTAL, CKMB, CKMBINDEX, TROPONINI in the last 168 hours. CBG (last 3)  Recent Labs  05/03/18 0744 05/03/18 1112 05/03/18 1604  GLUCAP 210* 230* 285*   Recent Results (from the past 240 hour(s))  Culture, blood (routine x 2)     Status: None (Preliminary result)   Collection Time: 04/29/18 12:33 PM  Result Value Ref Range Status   Specimen Description BLOOD LEFT ANTECUBITAL  Final   Special Requests   Final    BOTTLES DRAWN AEROBIC AND ANAEROBIC Blood Culture adequate volume   Culture   Final    NO GROWTH 4 DAYS Performed at [facility], [address], [place], [place] [address]    Report Status PENDING   Incomplete  Culture, blood (routine x 2)     Status: None (Preliminary result)   Collection Time: 04/29/18  3:12 PM  Result Value Ref Range Status   Specimen Description BLOOD BLOOD RIGHT FOREARM  Final   Special Requests   Final    BOTTLES DRAWN AEROBIC AND ANAEROBIC Blood Culture adequate volume   Culture   Final    NO GROWTH 4 DAYS Performed at [facility], [address] [place],  [address]    Report Status PENDING  Incomplete     Studies: No results found. Scheduled Meds: . collagenase   Topical Daily  . dicyclomine  10 mg Oral TID AC  . FLUoxetine  20 mg Oral q morning - 10a  . insulin aspart  0-15 Units Subcutaneous TID WC  . insulin aspart  0-5 Units Subcutaneous QHS  . insulin glargine  20 Units Subcutaneous Daily  . loratadine  10 mg Oral q morning - 10a  . pantoprazole  40 mg Oral Daily  . simvastatin  40 mg Oral QHS  . traZODone  50 mg Oral QHS   Continuous Infusions: . 0.9 % NaCl with KCl 40 mEq / L 35 mL/hr (05/03/18 0942)  . piperacillin-tazobactam (ZOSYN)  IV 3.375 g (05/03/18 0610)    Principal Problem:   Unspecified open wound of left buttock, initial encounter Active Problems:   DM (diabetes mellitus) ([place])   HTN (hypertension)   Left buttock abscess   Hypokalemia   Hypomagnesemia   Cellulitis of left buttock   Cellulitis of right buttock  Time spent: 49mins  Kathie Dike, MD [facility] 05/03/2018, 4:14 PM    LOS: 4 days

## 2018-05-04 LAB — GLUCOSE, CAPILLARY
GLUCOSE-CAPILLARY: 242 mg/dL — AB (ref 70–99)
Glucose-Capillary: 166 mg/dL — ABNORMAL HIGH (ref 70–99)
Glucose-Capillary: 202 mg/dL — ABNORMAL HIGH (ref 70–99)
Glucose-Capillary: 276 mg/dL — ABNORMAL HIGH (ref 70–99)

## 2018-05-04 LAB — CULTURE, BLOOD (ROUTINE X 2)
CULTURE: NO GROWTH
Culture: NO GROWTH
SPECIAL REQUESTS: ADEQUATE
Special Requests: ADEQUATE

## 2018-05-04 MED ORDER — DOXYCYCLINE HYCLATE 100 MG PO TABS
100.0000 mg | ORAL_TABLET | Freq: Two times a day (BID) | ORAL | Status: DC
  Administered 2018-05-04 – 2018-05-06 (×4): 100 mg via ORAL
  Filled 2018-05-04 (×4): qty 1

## 2018-05-04 MED ORDER — FLUCONAZOLE 100 MG PO TABS
100.0000 mg | ORAL_TABLET | Freq: Every day | ORAL | Status: DC
  Administered 2018-05-04: 100 mg via ORAL
  Filled 2018-05-04: qty 1

## 2018-05-04 MED ORDER — MICONAZOLE NITRATE 2 % VA CREA
1.0000 | TOPICAL_CREAM | Freq: Every day | VAGINAL | Status: DC
  Filled 2018-05-04: qty 45

## 2018-05-04 MED ORDER — CLOTRIMAZOLE 1 % VA CREA
1.0000 | TOPICAL_CREAM | Freq: Every day | VAGINAL | Status: DC
  Administered 2018-05-04 – 2018-05-05 (×2): 1 via VAGINAL
  Filled 2018-05-04: qty 45

## 2018-05-04 MED ORDER — INSULIN ASPART 100 UNIT/ML ~~LOC~~ SOLN
3.0000 [IU] | Freq: Three times a day (TID) | SUBCUTANEOUS | Status: DC
  Administered 2018-05-05 (×3): 3 [IU] via SUBCUTANEOUS

## 2018-05-04 NOTE — Progress Notes (Signed)
RN paged MD to make him aware that patient is c/o vaginal burning and requests medication for this.  Patient had dose of Diflucan yesterday, but wonders if there is something else she can have to relieve the pain, awaiting response.  P.J. Linus Mako, RN

## 2018-05-04 NOTE — Progress Notes (Signed)
PROGRESS NOTE  [name]  YQI:[id number]  DOB: 1960/08/28  DOA: 04/29/2018 PCP: [name], MD   Brief Admission Hx: 58 y.o. female with medical history significant for DM, hypertension, anal fissure, seizures, hypertension, depression, presented to the ED with complaints of left buttock swelling of about 1 week duration, she has not been able to sit on her left buttocks due to pain.  MDM/Assessment & Plan:   1. Cellulitis left buttock - appreciate assistance of surgery team, change zosyn to doxycycline.  Patient underwent bedside debridement.  Continue wound care and pain management. 2. Hypokalemia - replaced.  3. Type 2 diabetes - a1c 7.5%, continue sliding scale coverage.  Blood sugars remain elevated.  Continue lantus and add meal coverage novolog 4. Essential hypertension -blood pressure stable. Continue lisinopril 5. GAD - resumed home meds.    DVT prophylaxis: SCDs Code Status: Full  Family Communication: patient  Disposition Plan: inpatient for IV antibiotics awaiting skilled nursing facility placement on discharge  Consultants:  Surgery Arnoldo Morale)   Procedures:  Bedside debridement  Antimicrobials:  Zosyn 3/4 >> 3/8  Doxycycline 3/8>  Subjective: She has developed some vaginal itching. She feels as though she may be getting a yeast infection. Continues to have pain with dressing changes.  Objective: Vitals:   05/03/18 2106 05/03/18 2359 05/04/18 0512 05/04/18 1331  BP: (!) 140/111 (!) 150/74 140/67 139/73  Pulse: 62 65 68 66  Resp: 16  18 18   Temp: 98.4 F (36.9 C)  98.6 F (37 C) 98.3 F (36.8 C)  TempSrc: Oral  Oral   SpO2: 97% 97% 94% 98%  Weight:      Height:        Intake/Output Summary (Last 24 hours) at 05/04/2018 1813 Last data filed at 05/04/2018 1743 Gross per 24 hour  Intake 1963.57 ml  Output 1000 ml  Net 963.57 ml   Filed Weights   04/29/18 0938 04/30/18 0852  Weight: 84.8 kg 84.8 kg   REVIEW OF SYSTEMS  As per history  otherwise all reviewed and reported negative  Exam:  General exam: Alert, awake, oriented x 3 Respiratory system: Clear to auscultation. Respiratory effort normal. Cardiovascular system:RRR. No murmurs, rubs, gallops. Gastrointestinal system: Abdomen is nondistended, soft and nontender. No organomegaly or masses felt. Normal bowel sounds heard. Central nervous system: Alert and oriented. No focal neurological deficits. Extremities: No C/C/E, +pedal pulses Skin: left buttocks wound with improved erythema and packing in place Psychiatry: Judgement and insight appear normal. Mood & affect appropriate.     Data Reviewed: Basic Metabolic Panel: Recent Labs  Lab 04/29/18 1229 04/30/18 0745 05/01/18 0406 05/03/18 0623  NA 137 141 140 140  K 2.3* 3.3* 3.4* 3.8  CL 100 110 109 104  CO2 26 24 25 29   GLUCOSE 225* 172* 262* 216*  BUN 31* 23* 19 12  CREATININE 1.34* 1.13* 1.09* 1.01*  CALCIUM 8.8* 8.4* 8.4* 8.4*  MG 1.8 2.0 1.8  --   PHOS  --   --  3.3  --    Liver Function Tests: Recent Labs  Lab 05/01/18 0406  ALBUMIN 2.1*   No results for input(s): LIPASE, AMYLASE in the last 168 hours. No results for input(s): AMMONIA in the last 168 hours. CBC: Recent Labs  Lab 04/29/18 1229 05/01/18 0406  WBC 11.7* 7.6  NEUTROABS 9.0* 3.5  HGB 12.1 11.1*  HCT 37.0 36.0  MCV 81.7 84.5  PLT 312 348   Cardiac Enzymes: No results for input(s): CKTOTAL, CKMB, CKMBINDEX,  TROPONINI in the last 168 hours. CBG (last 3)  Recent Labs    05/04/18 0728 05/04/18 1124 05/04/18 1602  GLUCAP 166* 202* 276*   Recent Results (from the past 240 hour(s))  Culture, blood (routine x 2)     Status: None   Collection Time: 04/29/18 12:33 PM  Result Value Ref Range Status   Specimen Description BLOOD LEFT ANTECUBITAL  Final   Special Requests   Final    BOTTLES DRAWN AEROBIC AND ANAEROBIC Blood Culture adequate volume   Culture   Final    NO GROWTH 5 DAYS Performed at [facility], [address], [place], [place] [address]    Report Status 05/04/2018 FINAL  Final  Culture, blood (routine x 2)     Status: None   Collection Time: 04/29/18  3:12 PM  Result Value Ref Range Status   Specimen Description BLOOD BLOOD RIGHT FOREARM  Final   Special Requests   Final    BOTTLES DRAWN AEROBIC AND ANAEROBIC Blood Culture adequate volume   Culture   Final    NO GROWTH 5 DAYS Performed at [facility], [address] [place], [place] [address]    Report Status 05/04/2018 FINAL  Final     Studies: No results found. Scheduled Meds: . clotrimazole  1 Applicatorful Vaginal QHS  . collagenase   Topical Daily  . dicyclomine  10 mg Oral TID AC  . FLUoxetine  20 mg Oral q morning - 10a  . insulin aspart  0-15 Units Subcutaneous TID WC  . insulin aspart  0-5 Units Subcutaneous QHS  . insulin glargine  25 Units Subcutaneous Daily  . lisinopril  5 mg Oral Daily  . loratadine  10 mg Oral q morning - 10a  . pantoprazole  40 mg Oral Daily  . simvastatin  40 mg Oral QHS  . traZODone  50 mg Oral QHS   Continuous Infusions: . 0.9 % NaCl with KCl 40 mEq / L 35 mL/hr at 05/04/18 0627  . piperacillin-tazobactam (ZOSYN)  IV 3.375 g (05/04/18 1323)    Principal Problem:   Unspecified open wound of left buttock, initial encounter Active Problems:   DM (diabetes mellitus) ([place])   HTN (hypertension)   Left buttock abscess   Hypokalemia   Hypomagnesemia   Cellulitis of left buttock   Cellulitis of right buttock  Time spent: 85mins  Kathie Dike, MD [facility] 05/04/2018, 6:13 PM    LOS: 5 days

## 2018-05-04 NOTE — NC FL2 (Signed)
[place] LEVEL OF CARE SCREENING TOOL     IDENTIFICATION  Patient Name: [name] Birthdate: June 13, 1960 Sex: female Admission Date (Current Location): 04/29/2018  [place] and [place] Number:  [place] [facility] [id number] [place] and Address:  [facility] [address] [place]      Provider Number: [id number]  Attending Physician Name and Address:  Kathie Dike, MD  Relative Name and Phone Number:       Current Level of Care: Hospital Recommended Level of Care: [place] Prior Approval Number:    Date Approved/Denied:   PASRR Number:    Discharge Plan: SNF    Current Diagnoses: Patient Active Problem List   Diagnosis Date Noted  . Hypokalemia 04/30/2018  . Hypomagnesemia 04/30/2018  . Cellulitis of left buttock 04/30/2018  . Cellulitis of right buttock 04/30/2018  . Unspecified open wound of left buttock, initial encounter   . Left buttock abscess 04/29/2018  . History of colonic polyps 06/14/2016  . Diverticulitis of colon (without mention of hemorrhage)(562.11) 05/18/2013  . Acute renal failure ([place]) 05/14/2013  . Hyperkalemia 05/14/2013  . Hypotension 05/14/2013  . Tobacco abuse 05/14/2013  . DM (diabetes mellitus) ([place]) 11/05/2011  . HTN (hypertension) 11/05/2011  . Diarrhea 11/05/2011  . Anal fissure 11/05/2011    Orientation RESPIRATION BLADDER Height & Weight     Self, Time, Situation, Place  Normal Continent Weight: 186 lb 15.2 oz (84.8 kg) Height:  5\' 7"  (170.2 cm)  BEHAVIORAL SYMPTOMS/MOOD NEUROLOGICAL BOWEL NUTRITION STATUS      Continent Diet(carb modiified)  AMBULATORY STATUS COMMUNICATION OF NEEDS Skin   Independent Verbally Other (Comment)(non pressure wound on left buttock, 7 X 3 CM; 2 areas that tunnel)                       Personal Care Assistance Level of Assistance  Bathing, Feeding, Dressing Bathing Assistance: Independent Feeding assistance: Independent Dressing  Assistance: Independent     Functional Limitations Info  Sight, Hearing, Speech Sight Info: Adequate Hearing Info: Adequate Speech Info: Adequate    SPECIAL CARE FACTORS FREQUENCY                       Contractures Contractures Info: Not present    Additional Factors Info  Code Status, Allergies Code Status Info: Full code Allergies Info: No Known allergies Psychotropic Info: Prozac, trazodone         Current Medications (05/04/2018):  This is the current hospital active medication list Current Facility-Administered Medications  Medication Dose Route Frequency Provider Last Rate Last Dose  . 0.9 % NaCl with KCl 40 mEq / L  infusion   Intravenous Continuous Wynetta Emery, Clanford L, MD 35 mL/hr at 05/04/18 6720    . acetaminophen (TYLENOL) tablet 650 mg  650 mg Oral Q6H PRN Emokpae, Ejiroghene E, MD       Or  . acetaminophen (TYLENOL) suppository 650 mg  650 mg Rectal Q6H PRN Emokpae, Ejiroghene E, MD      . clotrimazole (GYNE-LOTRIMIN) vaginal cream 1 Applicatorful  1 Applicatorful Vaginal QHS Memon, Jolaine Artist, MD      . collagenase (SANTYL) ointment   Topical Daily Aviva Signs, MD      . dicyclomine (BENTYL) capsule 10 mg  10 mg Oral TID AC Emokpae, Ejiroghene E, MD   10 mg at 05/04/18 1143  . FLUoxetine (PROZAC) capsule 20 mg  20 mg Oral q morning - 10a Emokpae, Ejiroghene E,  MD   20 mg at 05/04/18 0835  . insulin aspart (novoLOG) injection 0-15 Units  0-15 Units Subcutaneous TID WC Johnson, Clanford L, MD   5 Units at 05/04/18 1143  . insulin aspart (novoLOG) injection 0-5 Units  0-5 Units Subcutaneous QHS Murlean Iba, MD   2 Units at 05/03/18 2155  . insulin glargine (LANTUS) injection 25 Units  25 Units Subcutaneous Daily Kathie Dike, MD   25 Units at 05/04/18 [phone]  . lisinopril (PRINIVIL,ZESTRIL) tablet 5 mg  5 mg Oral Daily Kathie Dike, MD   5 mg at 05/04/18 0836  . loratadine (CLARITIN) tablet 10 mg  10 mg Oral q morning - 10a Emokpae, Ejiroghene E, MD    10 mg at 05/04/18 0835  . LORazepam (ATIVAN) tablet 0.25 mg  0.25 mg Oral TID PRN Johnson, Clanford L, MD      . morphine 4 MG/ML injection 3 mg  3 mg Intravenous Q4H PRN Johnson, Clanford L, MD   3 mg at 05/04/18 1018  . ondansetron (ZOFRAN) tablet 4 mg  4 mg Oral Q6H PRN Emokpae, Ejiroghene E, MD       Or  . ondansetron (ZOFRAN) injection 4 mg  4 mg Intravenous Q6H PRN Emokpae, Ejiroghene E, MD      . oxyCODONE (Oxy IR/ROXICODONE) immediate release tablet 5 mg  5 mg Oral Q4H PRN Wynetta Emery, Clanford L, MD   5 mg at 05/04/18 0627  . pantoprazole (PROTONIX) EC tablet 40 mg  40 mg Oral Daily Emokpae, Ejiroghene E, MD   40 mg at 05/04/18 0835  . piperacillin-tazobactam (ZOSYN) IVPB 3.375 g  3.375 g Intravenous Q8H Coffee, Donna Christen, RPH 12.5 mL/hr at 05/04/18 1323 3.375 g at 05/04/18 1323  . simvastatin (ZOCOR) tablet 40 mg  40 mg Oral QHS Emokpae, Ejiroghene E, MD   40 mg at 05/03/18 2152  . traZODone (DESYREL) tablet 50 mg  50 mg Oral QHS Emokpae, Ejiroghene E, MD   50 mg at 05/03/18 2152     Discharge Medications: Please see discharge summary for a list of discharge medications.  Relevant Imaging Results:  Relevant Lab Results:   Additional Information SS# [id number]; patient will need SNF for wound care, will need daily dressing changes and will have packing in wound  Yeshua Stryker M, LCSW

## 2018-05-04 NOTE — Progress Notes (Addendum)
LCSW looked up patient information in [facility] and the she has not been issued a Passr number ( still under manual review) Weekday LCSW will follow up with patient.  [employer] LCSW [phone]

## 2018-05-05 LAB — GLUCOSE, CAPILLARY
Glucose-Capillary: 176 mg/dL — ABNORMAL HIGH (ref 70–99)
Glucose-Capillary: 187 mg/dL — ABNORMAL HIGH (ref 70–99)
Glucose-Capillary: 225 mg/dL — ABNORMAL HIGH (ref 70–99)
Glucose-Capillary: 217 mg/dL — ABNORMAL HIGH (ref 70–99)

## 2018-05-05 MED ORDER — INSULIN ASPART 100 UNIT/ML ~~LOC~~ SOLN
6.0000 [IU] | Freq: Three times a day (TID) | SUBCUTANEOUS | Status: DC
  Administered 2018-05-06 (×2): 6 [IU] via SUBCUTANEOUS

## 2018-05-05 NOTE — Progress Notes (Signed)
Inpatient Diabetes Program Recommendations  AACE/ADA: New Consensus Statement on Inpatient Glycemic Control (2015)  Target Ranges:  Prepandial:   less than 140 mg/dL      Peak postprandial:   less than 180 mg/dL (1-2 hours)      Critically ill patients:  140 - 180 mg/dL   Lab Results  Component Value Date   GLUCAP 176 (H) 05/05/2018   HGBA1C 7.5 (H) 04/29/2018    Review of Glycemic Control Results for [name], [name] (MRN [id number]) as of 05/05/2018 11:03  Ref. Range 05/04/2018 07:28 05/04/2018 11:24 05/04/2018 16:02 05/04/2018 21:00 05/05/2018 07:26  Glucose-Capillary Latest Ref Range: 70 - 99 mg/dL 166 (H) 202 (H) 276 (H) 242 (H) 176 (H)   Diabetes history: DM2 Outpatient Diabetes medications: Actos 30 mg + Glucotrol 5 mg bid Current orders for Inpatient glycemic control: Lantus 25 units qd + Novolog 3 units tid meal coverage + Novolog correction moderate tid + hs  Inpatient Diabetes Program Recommendations:   Noted postprandial CBGs elevated. -Increase Novolog meal coverage to 6 units tid if eats 50%  Thank you, Nani Gasser. Augusta Mirkin, RN, MSN, CDE  Diabetes Coordinator Inpatient Glycemic Control Team Team Pager [phone] (8am-5pm) 05/05/2018 11:05 AM

## 2018-05-05 NOTE — Progress Notes (Signed)
PROGRESS NOTE  [name]  ZWC:[id number]  DOB: May 11, 1960  DOA: 04/29/2018 PCP: [name], MD   Brief Admission Hx: 58 y.o. female with medical history significant for DM, hypertension, anal fissure, seizures, hypertension, depression, presented to the ED with complaints of left buttock swelling of about 1 week duration, she has not been able to sit on her left buttocks due to pain.  MDM/Assessment & Plan:   1. Cellulitis left buttock - appreciate assistance of surgery team, change zosyn to doxycycline.  Patient underwent bedside debridement.  Continue wound care and pain management. 2. Hypokalemia - replaced.  3. Type 2 diabetes - a1c 7.5%, continue sliding scale coverage.  Blood sugars remain elevated.  Continue lantus and adjust meal coverage novolog 4. Essential hypertension -blood pressure stable. Continue lisinopril 5. GAD - resumed home meds.    DVT prophylaxis: SCDs Code Status: Full  Family Communication: patient  Disposition Plan: awaiting skilled nursing facility placement on discharge  Consultants:  Surgery Arnoldo Morale)   Procedures:  Bedside debridement  Antimicrobials:  Zosyn 3/4 >> 3/8  Doxycycline 3/8>  Subjective: Vaginal itching is better. Continues to have pain with dressing changes  Objective: Vitals:   05/04/18 1331 05/04/18 2009 05/04/18 2118 05/05/18 0522  BP: 139/73  (!) 155/77 (!) 141/65  Pulse: 66  72 70  Resp: 18  18 18   Temp: 98.3 F (36.8 C)  98.3 F (36.8 C) 98 F (36.7 C)  TempSrc:   Oral Oral  SpO2: 98% 96% 97% 96%  Weight:      Height:        Intake/Output Summary (Last 24 hours) at 05/05/2018 1749 Last data filed at 05/05/2018 2353 Gross per 24 hour  Intake 480 ml  Output 1400 ml  Net -920 ml   Filed Weights   04/29/18 0938 04/30/18 0852  Weight: 84.8 kg 84.8 kg   REVIEW OF SYSTEMS  As per history otherwise all reviewed and reported negative  Exam:  General exam: Alert, awake, oriented x 3 Respiratory  system: Clear to auscultation. Respiratory effort normal. Cardiovascular system:RRR. No murmurs, rubs, gallops. Gastrointestinal system: Abdomen is nondistended, soft and nontender. No organomegaly or masses felt. Normal bowel sounds heard. Central nervous system: Alert and oriented. No focal neurological deficits. Extremities: No C/C/E, +pedal pulses Skin: left buttocks erythema improved, packing in wound not removed Psychiatry: Judgement and insight appear normal. Mood & affect appropriate.      Data Reviewed: Basic Metabolic Panel: Recent Labs  Lab 04/29/18 1229 04/30/18 0745 05/01/18 0406 05/03/18 0623  NA 137 141 140 140  K 2.3* 3.3* 3.4* 3.8  CL 100 110 109 104  CO2 26 24 25 29   GLUCOSE 225* 172* 262* 216*  BUN 31* 23* 19 12  CREATININE 1.34* 1.13* 1.09* 1.01*  CALCIUM 8.8* 8.4* 8.4* 8.4*  MG 1.8 2.0 1.8  --   PHOS  --   --  3.3  --    Liver Function Tests: Recent Labs  Lab 05/01/18 0406  ALBUMIN 2.1*   No results for input(s): LIPASE, AMYLASE in the last 168 hours. No results for input(s): AMMONIA in the last 168 hours. CBC: Recent Labs  Lab 04/29/18 1229 05/01/18 0406  WBC 11.7* 7.6  NEUTROABS 9.0* 3.5  HGB 12.1 11.1*  HCT 37.0 36.0  MCV 81.7 84.5  PLT 312 348   Cardiac Enzymes: No results for input(s): CKTOTAL, CKMB, CKMBINDEX, TROPONINI in the last 168 hours. CBG (last 3)  Recent Labs    05/05/18 0726  05/05/18 1121 05/05/18 1603  GLUCAP 176* 187* 225*   Recent Results (from the past 240 hour(s))  Culture, blood (routine x 2)     Status: None   Collection Time: 04/29/18 12:33 PM  Result Value Ref Range Status   Specimen Description BLOOD LEFT ANTECUBITAL  Final   Special Requests   Final    BOTTLES DRAWN AEROBIC AND ANAEROBIC Blood Culture adequate volume   Culture   Final    NO GROWTH 5 DAYS Performed at [facility], [address], [place], [place] [address]    Report Status 05/04/2018 FINAL  Final  Culture, blood (routine x 2)      Status: None   Collection Time: 04/29/18  3:12 PM  Result Value Ref Range Status   Specimen Description BLOOD BLOOD RIGHT FOREARM  Final   Special Requests   Final    BOTTLES DRAWN AEROBIC AND ANAEROBIC Blood Culture adequate volume   Culture   Final    NO GROWTH 5 DAYS Performed at [facility], [address], [place], [place] [address]    Report Status 05/04/2018 FINAL  Final     Studies: No results found. Scheduled Meds: . clotrimazole  1 Applicatorful Vaginal QHS  . collagenase   Topical Daily  . dicyclomine  10 mg Oral TID AC  . doxycycline  100 mg Oral Q12H  . FLUoxetine  20 mg Oral q morning - 10a  . insulin aspart  0-15 Units Subcutaneous TID WC  . insulin aspart  0-5 Units Subcutaneous QHS  . insulin aspart  3 Units Subcutaneous TID WC  . insulin glargine  25 Units Subcutaneous Daily  . lisinopril  5 mg Oral Daily  . loratadine  10 mg Oral q morning - 10a  . pantoprazole  40 mg Oral Daily  . simvastatin  40 mg Oral QHS  . traZODone  50 mg Oral QHS   Continuous Infusions:   Principal Problem:   Unspecified open wound of left buttock, initial encounter Active Problems:   DM (diabetes mellitus) (HCC)   HTN (hypertension)   Left buttock abscess   Hypokalemia   Hypomagnesemia   Cellulitis of left buttock   Cellulitis of right buttock  Time spent: 61mins  Kathie Dike, MD [facility] 05/05/2018, 5:49 PM    LOS: 6 days

## 2018-05-06 LAB — GLUCOSE, CAPILLARY
Glucose-Capillary: 166 mg/dL — ABNORMAL HIGH (ref 70–99)
Glucose-Capillary: 154 mg/dL — ABNORMAL HIGH (ref 70–99)

## 2018-05-06 MED ORDER — CLOTRIMAZOLE 1 % VA CREA: 1.0000 | TOPICAL_CREAM | Freq: Every day | VAGINAL | 0 refills | Status: DC

## 2018-05-06 MED ORDER — LOPERAMIDE HCL 2 MG PO CAPS: 2.0000 mg | ORAL_CAPSULE | Freq: Three times a day (TID) | ORAL | 0 refills | Status: DC | PRN

## 2018-05-06 MED ORDER — SACCHAROMYCES BOULARDII 250 MG PO CAPS: 250.0000 mg | ORAL_CAPSULE | Freq: Two times a day (BID) | ORAL | 0 refills | Status: DC

## 2018-05-06 MED ORDER — OXYCODONE HCL 5 MG PO TABS: 5.0000 mg | ORAL_TABLET | ORAL | 0 refills | Status: DC | PRN

## 2018-05-06 MED ORDER — ZOLPIDEM TARTRATE 5 MG PO TABS: 5.0000 mg | ORAL_TABLET | Freq: Every evening | ORAL | Status: DC | PRN

## 2018-05-06 MED ORDER — COLLAGENASE 250 UNIT/GM EX OINT: TOPICAL_OINTMENT | Freq: Every day | CUTANEOUS | 0 refills | Status: DC

## 2018-05-06 MED ORDER — LOPERAMIDE HCL 2 MG PO CAPS: 2.0000 mg | ORAL_CAPSULE | Freq: Three times a day (TID) | ORAL | Status: DC | PRN

## 2018-05-06 NOTE — Care Management Note (Signed)
Case Management Note  Patient Details  Name: [name] MRN: [id number] Date of Birth: [birth year]   Patient now elects to go home with home health. She has found a church friend, [name]) to help with dressing/packing changes.  [name] of [facility] notified and will obtain home healht RN orders via Epic.    Expected Discharge Date:  05/02/18               Expected Discharge Plan:  Skilled Nursing Facility  In-House Referral:  Clinical Social Work  Discharge planning Services  CM Consult  Post Acute Care Choice:    Choice offered to:  Patient  DME Arranged:    DME Agency:     HH Arranged:  RN [place] Agency:  [place] ([facility])  Status of Service:  Completed, signed off  If discussed at [employer] of Stay Meetings, dates discussed:    Additional Comments:  [name], [name], RN 05/06/2018, 2:14 PM

## 2018-05-06 NOTE — Progress Notes (Signed)
Nsg Discharge Note  Admit Date:  04/29/2018 Discharge date: 05/06/2018   [name] to be D/C'd Home per MD order.  AVS completed.  Copy for chart, and copy for patient signed, and dated. Patient/caregiver able to verbalize understanding.  Discharge Medication: Allergies as of 05/06/2018   No Known Allergies     Medication List    TAKE these medications   clotrimazole 1 % vaginal cream Commonly known as:  GYNE-LOTRIMIN Place 1 Applicatorful vaginally at bedtime.   collagenase ointment Commonly known as:  SANTYL Apply topically daily. Start taking on:  May 07, 2018   dicyclomine 10 MG capsule Commonly known as:  BENTYL TAKE ONE CAPSULE BY MOUTH TWICE A DAY BEFORE A MEAL What changed:  See the new instructions.   FLUoxetine 20 MG capsule Commonly known as:  PROZAC Take 20 mg by mouth every morning.   fluticasone 50 MCG/ACT nasal spray Commonly known as:  FLONASE Place 2 sprays into both nostrils daily as needed for allergies or rhinitis.   glipiZIDE 5 MG tablet Commonly known as:  GLUCOTROL Take 5 mg by mouth 2 (two) times daily before a meal.   lisinopril 5 MG tablet Commonly known as:  PRINIVIL,ZESTRIL Take 5 mg by mouth every morning.   loperamide 2 MG capsule Commonly known as:  IMODIUM Take 1 capsule (2 mg total) by mouth 3 (three) times daily as needed for diarrhea or loose stools.   loratadine 10 MG tablet Commonly known as:  CLARITIN Take 10 mg by mouth every morning.   LORazepam 0.5 MG tablet Commonly known as:  ATIVAN Take 0.5 mg by mouth 3 (three) times daily.   naproxen 500 MG tablet Commonly known as:  NAPROSYN Take 500 mg by mouth 2 (two) times daily with a meal.   oxyCODONE 5 MG immediate release tablet Commonly known as:  Oxy IR/ROXICODONE Take 1 tablet (5 mg total) by mouth every 4 (four) hours as needed for moderate pain.   pantoprazole 40 MG tablet Commonly known as:  PROTONIX Take 1 tablet (40 mg total) by mouth daily.    pioglitazone 30 MG tablet Commonly known as:  ACTOS Take 30 mg by mouth daily.   saccharomyces boulardii 250 MG capsule Commonly known as:  Florastor Take 1 capsule (250 mg total) by mouth 2 (two) times daily.   simvastatin 40 MG tablet Commonly known as:  ZOCOR Take 40 mg by mouth at bedtime.   traZODone 50 MG tablet Commonly known as:  DESYREL Take 50 mg by mouth at bedtime.   Vitamin D3 125 MCG (5000 UT) Caps Take 1 capsule by mouth daily.   zolpidem 10 MG tablet Commonly known as:  AMBIEN Take 10 mg by mouth at bedtime.       Discharge Assessment: Vitals:   05/06/18 0611 05/06/18 1317  BP: (!) 151/59 133/73  Pulse: (!) 56 72  Resp: 18 16  Temp: 98 F (36.7 C) 99 F (37.2 C)  SpO2: 93% 96%   Skin clean, dry and intact without evidence of skin break down, no evidence of skin tears noted. IV catheter discontinued intact. Site without signs and symptoms of complications - no redness or edema noted at insertion site, patient denies c/o pain - only slight tenderness at site.  Dressing with slight pressure applied.  D/c Instructions-Education: Discharge instructions given to patient/family with verbalized understanding. D/c education completed with patient/family including follow up instructions, medication list, d/c activities limitations if indicated, with other d/c instructions as indicated  by MD - patient able to verbalize understanding, all questions fully answered. Patient instructed to return to ED, call 911, or call MD for any changes in condition.  Patient escorted via [place], and D/C home via private auto.  Loa Socks, RN 05/06/2018 3:16 PM

## 2018-05-06 NOTE — Discharge Summary (Signed)
Physician Discharge Summary  [name] BDZ:[id number] DOB: 05-10-1960 DOA: 04/29/2018  PCP: [name], MD  Admit date: 04/29/2018 Discharge date: 05/06/2018  Admitted From: Home Disposition: Home  Recommendations for Outpatient Follow-up:  1. Follow up with PCP in 1-2 weeks 2. Please obtain BMP/CBC in one week  Home Health: Home health RN Equipment/Devices:  Discharge Condition: Stable CODE STATUS: Full code Diet recommendation: Heart healthy, carb modified  Brief/Interim Summary: 58 year old female with history of diabetes, hypertension, presented to the emergency room with left buttock swelling for about 1 week duration.  She was having difficulty sitting on her left buttocks due to pain.  She was found to have cellulitis and an abscess in this area.  Discharge Diagnoses:  Principal Problem:   Unspecified open wound of left buttock, initial encounter Active Problems:   DM (diabetes mellitus) ([place])   HTN (hypertension)   Left buttock abscess   Hypokalemia   Hypomagnesemia   Cellulitis of left buttock   Cellulitis of right buttock  1. Cellulitis of left buttocks.  Patient seen by surgery and underwent bedside debridement of her wound.  It is currently draining.  She was seen by wound care who recommended further enzymatic debridement with Santyl and packing wound with saline gauze.  This should be replaced every day.  She was treated with intravenous Zosyn and subsequently transitioned to doxycycline.  Her overall cellulitis has significantly improved.  She is completed a course of antibiotics in the hospital.  She will continue wound care at home.  Initially, plans were to send the patient to skilled nursing facility for wound care, but currently, she has elected to go home with family support. 2. Hypokalemia.  Replace. 3. Type 2 diabetes.  A1c was 7.5.  Resume home regimen on discharge. 4. Hypertension.  Blood pressure stable on lisinopril.  Discharge  Instructions  Discharge Instructions    Diet - low sodium heart healthy   Complete by:  As directed    Increase activity slowly   Complete by:  As directed      Allergies as of 05/06/2018   No Known Allergies     Medication List    TAKE these medications   clotrimazole 1 % vaginal cream Commonly known as:  GYNE-LOTRIMIN Place 1 Applicatorful vaginally at bedtime.   collagenase ointment Commonly known as:  SANTYL Apply topically daily. Start taking on:  May 07, 2018   dicyclomine 10 MG capsule Commonly known as:  BENTYL TAKE ONE CAPSULE BY MOUTH TWICE A DAY BEFORE A MEAL What changed:  See the new instructions.   FLUoxetine 20 MG capsule Commonly known as:  PROZAC Take 20 mg by mouth every morning.   fluticasone 50 MCG/ACT nasal spray Commonly known as:  FLONASE Place 2 sprays into both nostrils daily as needed for allergies or rhinitis.   glipiZIDE 5 MG tablet Commonly known as:  GLUCOTROL Take 5 mg by mouth 2 (two) times daily before a meal.   lisinopril 5 MG tablet Commonly known as:  PRINIVIL,ZESTRIL Take 5 mg by mouth every morning.   loperamide 2 MG capsule Commonly known as:  IMODIUM Take 1 capsule (2 mg total) by mouth 3 (three) times daily as needed for diarrhea or loose stools.   loratadine 10 MG tablet Commonly known as:  CLARITIN Take 10 mg by mouth every morning.   LORazepam 0.5 MG tablet Commonly known as:  ATIVAN Take 0.5 mg by mouth 3 (three) times daily.   naproxen 500 MG tablet Commonly  known as:  NAPROSYN Take 500 mg by mouth 2 (two) times daily with a meal.   oxyCODONE 5 MG immediate release tablet Commonly known as:  Oxy IR/ROXICODONE Take 1 tablet (5 mg total) by mouth every 4 (four) hours as needed for moderate pain.   pantoprazole 40 MG tablet Commonly known as:  PROTONIX Take 1 tablet (40 mg total) by mouth daily.   pioglitazone 30 MG tablet Commonly known as:  ACTOS Take 30 mg by mouth daily.   saccharomyces boulardii  250 MG capsule Commonly known as:  Florastor Take 1 capsule (250 mg total) by mouth 2 (two) times daily.   simvastatin 40 MG tablet Commonly known as:  ZOCOR Take 40 mg by mouth at bedtime.   traZODone 50 MG tablet Commonly known as:  DESYREL Take 50 mg by mouth at bedtime.   Vitamin D3 125 MCG (5000 UT) Caps Take 1 capsule by mouth daily.   zolpidem 10 MG tablet Commonly known as:  AMBIEN Take 10 mg by mouth at bedtime.       No Known Allergies  Consultations:  General surgery   Procedures/Studies: Ct Abdomen Pelvis W Contrast  Result Date: 04/29/2018 CLINICAL DATA:  Buttock abscess for 1 week with brown discharge. History of diabetes. EXAM: CT ABDOMEN AND PELVIS WITH CONTRAST TECHNIQUE: Multidetector CT imaging of the abdomen and pelvis was performed using the standard protocol following bolus administration of intravenous contrast. CONTRAST:  50mL OMNIPAQUE IOHEXOL 300 MG/ML  SOLN COMPARISON:  CT abdomen and pelvis May 17, 2013 FINDINGS: LOWER CHEST: Dependent atelectasis. Heart size is mildly enlarged. No pericardial effusion. HEPATOBILIARY: Mild gallbladder distension with faintly dense cholelithiasis, unchanged. Partially calcified gallstone at gallbladder neck. No CT findings of acute cholecystitis. Negative liver. PANCREAS: Normal. SPLEEN: Normal. ADRENALS/URINARY TRACT: Kidneys are orthotopic, demonstrating symmetric enhancement. No nephrolithiasis, hydronephrosis or solid renal masses. Too small to characterize hypodensity upper pole LEFT kidney. The unopacified ureters are normal in course and caliber. Delayed imaging through the kidneys demonstrates symmetric prompt contrast excretion within the proximal urinary collecting system. Urinary bladder is partially distended and unremarkable. Normal adrenal glands. STOMACH/BOWEL: Severe colonic diverticulosis with sigmoid wall thickening. Small and large bowel normal course and caliber. Normal air-filled appendix.  VASCULAR/LYMPHATIC: Aortoiliac vessels are normal in course and caliber. Moderate intimal thickening calcific atherosclerosis. No lymphadenopathy by CT size criteria. REPRODUCTIVE: Normal. OTHER: No intraperitoneal free fluid or free air. MUSCULOSKELETAL: LEFT gluteal subcutaneous fat stranding, subcutaneous gas and skin thickening without drainable fluid collection. No intraperitoneal extension. Severe L5-S1 degenerative disc. IMPRESSION: 1. LEFT gluteal subcutaneous gas and cellulitis without drainable fluid collection. Anal fistula not demonstrated though not excluded, enteric contrast may be of added value. 2. Severe colonic diverticulosis without acute diverticulitis nor acute intra-abdominal/pelvic process. 3. Acute findings discussed with and reconfirmed by Dr.KATHLEEN MCMANUS on 04/29/2018 at 3:12 pm. Aortic Atherosclerosis (ICD10-I70.0). Electronically Signed   By: Elon Alas M.D.   On: 04/29/2018 15:13       Subjective: Had some loose stools overnight.  No fever.  Did not sleep well overnight.  Continues to have some pain with dressing changes but overall that is improving.  Discharge Exam: Vitals:   05/05/18 2111 05/05/18 2127 05/06/18 0611 05/06/18 1317  BP: 138/80  (!) 151/59 133/73  Pulse: 63  (!) 56 72  Resp: 18  18 16   Temp: 97.7 F (36.5 C)  98 F (36.7 C) 99 F (37.2 C)  TempSrc: Oral   Oral  SpO2: 96% 96% 93% 96%  Weight:      Height:        General: Pt is alert, awake, not in acute distress Cardiovascular: RRR, S1/S2 +, no rubs, no gallops Respiratory: CTA bilaterally, no wheezing, no rhonchi Abdominal: Soft, NT, ND, bowel sounds + Extremities: no edema, no cyanosis Skin: Erythema over left buttocks is improved.  Wound has packing in place.    The results of significant diagnostics from this hospitalization (including imaging, microbiology, ancillary and laboratory) are listed below for reference.     Microbiology: Recent Results (from the past 240  hour(s))  Culture, blood (routine x 2)     Status: None   Collection Time: 04/29/18 12:33 PM  Result Value Ref Range Status   Specimen Description BLOOD LEFT ANTECUBITAL  Final   Special Requests   Final    BOTTLES DRAWN AEROBIC AND ANAEROBIC Blood Culture adequate volume   Culture   Final    NO GROWTH 5 DAYS Performed at [facility], [address] [place], [place] [address]    Report Status 05/04/2018 FINAL  Final  Culture, blood (routine x 2)     Status: None   Collection Time: 04/29/18  3:12 PM  Result Value Ref Range Status   Specimen Description BLOOD BLOOD RIGHT FOREARM  Final   Special Requests   Final    BOTTLES DRAWN AEROBIC AND ANAEROBIC Blood Culture adequate volume   Culture   Final    NO GROWTH 5 DAYS Performed at [name], [address], [place], [place] [address]    Report Status 05/04/2018 FINAL  Final     Labs: BNP (last 3 results) No results for input(s): BNP in the last 8760 hours. Basic Metabolic Panel: Recent Labs  Lab 04/30/18 0745 05/01/18 0406 05/03/18 0623  NA 141 140 140  K 3.3* 3.4* 3.8  CL 110 109 104  CO2 24 25 29   GLUCOSE 172* 262* 216*  BUN 23* 19 12  CREATININE 1.13* 1.09* 1.01*  CALCIUM 8.4* 8.4* 8.4*  MG 2.0 1.8  --   PHOS  --  3.3  --    Liver Function Tests: Recent Labs  Lab 05/01/18 0406  ALBUMIN 2.1*   No results for input(s): LIPASE, AMYLASE in the last 168 hours. No results for input(s): AMMONIA in the last 168 hours. CBC: Recent Labs  Lab 05/01/18 0406  WBC 7.6  NEUTROABS 3.5  HGB 11.1*  HCT 36.0  MCV 84.5  PLT 348   Cardiac Enzymes: No results for input(s): CKTOTAL, CKMB, CKMBINDEX, TROPONINI in the last 168 hours. BNP: Invalid input(s): POCBNP CBG: Recent Labs  Lab 05/05/18 1121 05/05/18 1603 05/05/18 2113 05/06/18 0727 05/06/18 1056  GLUCAP 187* 225* 217* 166* 154*   D-Dimer No results for input(s): DDIMER in the last 72 hours. Hgb A1c No results for input(s): HGBA1C in the last 72  hours. Lipid Profile No results for input(s): CHOL, HDL, LDLCALC, TRIG, CHOLHDL, LDLDIRECT in the last 72 hours. Thyroid function studies No results for input(s): TSH, T4TOTAL, T3FREE, THYROIDAB in the last 72 hours.  Invalid input(s): FREET3 Anemia work up No results for input(s): VITAMINB12, FOLATE, FERRITIN, TIBC, IRON, RETICCTPCT in the last 72 hours. Urinalysis    Component Value Date/Time   COLORURINE YELLOW 05/14/2013 1205   APPEARANCEUR CLEAR 05/14/2013 1205   LABSPEC >1.030 (H) 05/14/2013 1205   PHURINE 5.5 05/14/2013 1205   GLUCOSEU NEGATIVE 05/14/2013 1205   HGBUR NEGATIVE 05/14/2013 1205   BILIRUBINUR NEGATIVE 05/14/2013 1205   KETONESUR NEGATIVE  05/14/2013 [place] 05/14/2013 1205   UROBILINOGEN 0.2 05/14/2013 1205   NITRITE NEGATIVE 05/14/2013 1205   LEUKOCYTESUR NEGATIVE 05/14/2013 1205   Sepsis Labs Invalid input(s): PROCALCITONIN,  WBC,  LACTICIDVEN Microbiology Recent Results (from the past 240 hour(s))  Culture, blood (routine x 2)     Status: None   Collection Time: 04/29/18 12:33 PM  Result Value Ref Range Status   Specimen Description BLOOD LEFT ANTECUBITAL  Final   Special Requests   Final    BOTTLES DRAWN AEROBIC AND ANAEROBIC Blood Culture adequate volume   Culture   Final    NO GROWTH 5 DAYS Performed at [facility], [address], [place], [place] [address]    Report Status 05/04/2018 FINAL  Final  Culture, blood (routine x 2)     Status: None   Collection Time: 04/29/18  3:12 PM  Result Value Ref Range Status   Specimen Description BLOOD BLOOD RIGHT FOREARM  Final   Special Requests   Final    BOTTLES DRAWN AEROBIC AND ANAEROBIC Blood Culture adequate volume   Culture   Final    NO GROWTH 5 DAYS Performed at [facility], [address], [place], [place] [address]    Report Status 05/04/2018 FINAL  Final     Time coordinating discharge: 24mins  SIGNED:   Kathie Dike, MD  [facility] 05/06/2018, 3:07  PM   If 7PM-7AM, please contact night-coverage [email or web address]

## 2018-05-07 ENCOUNTER — Other Ambulatory Visit (HOSPITAL_COMMUNITY): Payer: Self-pay | Admitting: Family Medicine

## 2018-05-07 DIAGNOSIS — Z1231 Encounter for screening mammogram for malignant neoplasm of breast: Secondary | ICD-10-CM

## 2018-06-19 ENCOUNTER — Ambulatory Visit (HOSPITAL_COMMUNITY): Payer: Medicaid Other

## 2018-07-11 ENCOUNTER — Other Ambulatory Visit: Payer: Self-pay

## 2018-07-11 ENCOUNTER — Ambulatory Visit (HOSPITAL_COMMUNITY)
Admission: RE | Admit: 2018-07-11 | Discharge: 2018-07-11 | Disposition: A | Discharge status: Home / Self Care | Payer: Medicaid Other | Source: Ambulatory Visit | Attending: Family Medicine | Admitting: Family Medicine

## 2018-07-11 DIAGNOSIS — Z1231 Encounter for screening mammogram for malignant neoplasm of breast: Secondary | ICD-10-CM | POA: Diagnosis present

## 2019-03-17 ENCOUNTER — Other Ambulatory Visit: Payer: Self-pay

## 2019-03-17 ENCOUNTER — Ambulatory Visit: Payer: Medicaid Other | Attending: Internal Medicine

## 2019-03-17 DIAGNOSIS — Z20822 Contact with and (suspected) exposure to covid-19: Secondary | ICD-10-CM

## 2019-03-18 LAB — NOVEL CORONAVIRUS, NAA: SARS-CoV-2, NAA: NOT DETECTED

## 2019-03-20 ENCOUNTER — Other Ambulatory Visit: Payer: Self-pay

## 2019-03-20 ENCOUNTER — Emergency Department (HOSPITAL_COMMUNITY): Payer: Medicaid Other

## 2019-03-20 ENCOUNTER — Inpatient Hospital Stay (HOSPITAL_COMMUNITY)
Admission: EM | Admit: 2019-03-20 | Discharge: 2019-03-24 | DRG: 286 | Disposition: A | Discharge status: Home / Self Care | Payer: Medicaid Other | Attending: Cardiology | Admitting: Cardiology

## 2019-03-20 ENCOUNTER — Encounter (HOSPITAL_COMMUNITY): Payer: Self-pay | Admitting: Emergency Medicine

## 2019-03-20 DIAGNOSIS — Z7984 Long term (current) use of oral hypoglycemic drugs: Secondary | ICD-10-CM

## 2019-03-20 DIAGNOSIS — F419 Anxiety disorder, unspecified: Secondary | ICD-10-CM | POA: Diagnosis present

## 2019-03-20 DIAGNOSIS — I5041 Acute combined systolic (congestive) and diastolic (congestive) heart failure: Secondary | ICD-10-CM | POA: Diagnosis not present

## 2019-03-20 DIAGNOSIS — I340 Nonrheumatic mitral (valve) insufficiency: Secondary | ICD-10-CM | POA: Diagnosis not present

## 2019-03-20 DIAGNOSIS — K219 Gastro-esophageal reflux disease without esophagitis: Secondary | ICD-10-CM | POA: Diagnosis present

## 2019-03-20 DIAGNOSIS — F329 Major depressive disorder, single episode, unspecified: Secondary | ICD-10-CM | POA: Diagnosis present

## 2019-03-20 DIAGNOSIS — J9601 Acute respiratory failure with hypoxia: Secondary | ICD-10-CM | POA: Diagnosis not present

## 2019-03-20 DIAGNOSIS — I371 Nonrheumatic pulmonary valve insufficiency: Secondary | ICD-10-CM | POA: Diagnosis not present

## 2019-03-20 DIAGNOSIS — E1122 Type 2 diabetes mellitus with diabetic chronic kidney disease: Secondary | ICD-10-CM

## 2019-03-20 DIAGNOSIS — I110 Hypertensive heart disease with heart failure: Principal | ICD-10-CM | POA: Diagnosis present

## 2019-03-20 DIAGNOSIS — N289 Disorder of kidney and ureter, unspecified: Secondary | ICD-10-CM | POA: Diagnosis not present

## 2019-03-20 DIAGNOSIS — Z720 Tobacco use: Secondary | ICD-10-CM | POA: Diagnosis not present

## 2019-03-20 DIAGNOSIS — R569 Unspecified convulsions: Secondary | ICD-10-CM | POA: Diagnosis present

## 2019-03-20 DIAGNOSIS — J449 Chronic obstructive pulmonary disease, unspecified: Secondary | ICD-10-CM | POA: Diagnosis present

## 2019-03-20 DIAGNOSIS — M542 Cervicalgia: Secondary | ICD-10-CM | POA: Diagnosis present

## 2019-03-20 DIAGNOSIS — E876 Hypokalemia: Secondary | ICD-10-CM | POA: Diagnosis not present

## 2019-03-20 DIAGNOSIS — E119 Type 2 diabetes mellitus without complications: Secondary | ICD-10-CM | POA: Diagnosis present

## 2019-03-20 DIAGNOSIS — K602 Anal fissure, unspecified: Secondary | ICD-10-CM | POA: Diagnosis present

## 2019-03-20 DIAGNOSIS — Z20822 Contact with and (suspected) exposure to covid-19: Secondary | ICD-10-CM | POA: Diagnosis present

## 2019-03-20 DIAGNOSIS — K580 Irritable bowel syndrome with diarrhea: Secondary | ICD-10-CM | POA: Diagnosis present

## 2019-03-20 DIAGNOSIS — F17200 Nicotine dependence, unspecified, uncomplicated: Secondary | ICD-10-CM | POA: Diagnosis present

## 2019-03-20 DIAGNOSIS — E785 Hyperlipidemia, unspecified: Secondary | ICD-10-CM | POA: Diagnosis present

## 2019-03-20 DIAGNOSIS — I10 Essential (primary) hypertension: Secondary | ICD-10-CM | POA: Diagnosis present

## 2019-03-20 DIAGNOSIS — E1165 Type 2 diabetes mellitus with hyperglycemia: Secondary | ICD-10-CM | POA: Diagnosis not present

## 2019-03-20 DIAGNOSIS — G8929 Other chronic pain: Secondary | ICD-10-CM | POA: Diagnosis present

## 2019-03-20 DIAGNOSIS — F17210 Nicotine dependence, cigarettes, uncomplicated: Secondary | ICD-10-CM | POA: Diagnosis present

## 2019-03-20 DIAGNOSIS — Z79899 Other long term (current) drug therapy: Secondary | ICD-10-CM | POA: Diagnosis not present

## 2019-03-20 DIAGNOSIS — I5020 Unspecified systolic (congestive) heart failure: Secondary | ICD-10-CM

## 2019-03-20 DIAGNOSIS — Z8249 Family history of ischemic heart disease and other diseases of the circulatory system: Secondary | ICD-10-CM | POA: Diagnosis not present

## 2019-03-20 DIAGNOSIS — I428 Other cardiomyopathies: Secondary | ICD-10-CM | POA: Diagnosis present

## 2019-03-20 DIAGNOSIS — I509 Heart failure, unspecified: Secondary | ICD-10-CM | POA: Diagnosis not present

## 2019-03-20 DIAGNOSIS — Z7952 Long term (current) use of systemic steroids: Secondary | ICD-10-CM | POA: Diagnosis not present

## 2019-03-20 DIAGNOSIS — Z8261 Family history of arthritis: Secondary | ICD-10-CM | POA: Diagnosis not present

## 2019-03-20 DIAGNOSIS — I5021 Acute systolic (congestive) heart failure: Secondary | ICD-10-CM | POA: Diagnosis present

## 2019-03-20 HISTORY — DX: Type 2 diabetes mellitus without complications: E11.9

## 2019-03-20 HISTORY — DX: Personal history of other diseases of the nervous system and sense organs: Z86.69

## 2019-03-20 LAB — COMPREHENSIVE METABOLIC PANEL
ALT: 23 U/L (ref 0–44)
AST: 17 U/L (ref 15–41)
Albumin: 3.5 g/dL (ref 3.5–5.0)
Alkaline Phosphatase: 104 U/L (ref 38–126)
Anion gap: 10 (ref 5–15)
BUN: 17 mg/dL (ref 6–20)
CO2: 29 mmol/L (ref 22–32)
Calcium: 8.9 mg/dL (ref 8.9–10.3)
Chloride: 101 mmol/L (ref 98–111)
Creatinine, Ser: 0.91 mg/dL (ref 0.44–1.00)
GFR calc Af Amer: >60 mL/min (ref >60)
GFR calc non Af Amer: >60 mL/min (ref >60)
Glucose, Bld: 203 mg/dL — ABNORMAL HIGH (ref 70–99)
Potassium: 3.1 mmol/L — ABNORMAL LOW (ref 3.5–5.1)
Sodium: 140 mmol/L (ref 135–145)
Total Bilirubin: 0.7 mg/dL (ref 0.3–1.2)
Total Protein: 6.7 g/dL (ref 6.5–8.1)

## 2019-03-20 LAB — RESPIRATORY PANEL BY RT PCR (FLU A&B, COVID)
Influenza A by PCR: NEGATIVE
Influenza B by PCR: NEGATIVE
SARS Coronavirus 2 by RT PCR: NEGATIVE

## 2019-03-20 LAB — CBC WITH DIFFERENTIAL/PLATELET
Abs Immature Granulocytes: 0.03 10*3/uL (ref 0.00–0.07)
Basophils Absolute: 0.1 10*3/uL (ref 0.0–0.1)
Basophils Relative: 1 %
Eosinophils Absolute: 0.1 10*3/uL (ref 0.0–0.5)
Eosinophils Relative: 1 %
HCT: 39.8 % (ref 36.0–46.0)
Hemoglobin: 12.5 g/dL (ref 12.0–15.0)
Immature Granulocytes: 0 %
Lymphocytes Relative: 14 %
Lymphs Abs: 1.2 10*3/uL (ref 0.7–4.0)
MCH: 26.2 pg (ref 26.0–34.0)
MCHC: 31.4 g/dL (ref 30.0–36.0)
MCV: 83.4 fL (ref 80.0–100.0)
Monocytes Absolute: 0.5 10*3/uL (ref 0.1–1.0)
Monocytes Relative: 6 %
Neutro Abs: 6.6 10*3/uL (ref 1.7–7.7)
Neutrophils Relative %: 78 %
Platelets: 267 10*3/uL (ref 150–400)
RBC: 4.77 MIL/uL (ref 3.87–5.11)
RDW: 14.8 % (ref 11.5–15.5)
WBC: 8.5 10*3/uL (ref 4.0–10.5)
nRBC: 0 % (ref 0.0–0.2)

## 2019-03-20 LAB — BRAIN NATRIURETIC PEPTIDE: B Natriuretic Peptide: 464 pg/mL — ABNORMAL HIGH (ref 0.0–100.0)

## 2019-03-20 LAB — D-DIMER, QUANTITATIVE: D-Dimer, Quant: 0.9 ug/mL-FEU — ABNORMAL HIGH (ref 0.00–0.50)

## 2019-03-20 LAB — TROPONIN I (HIGH SENSITIVITY)
Troponin I (High Sensitivity): 27 ng/L — ABNORMAL HIGH (ref <18)
Troponin I (High Sensitivity): 33 ng/L — ABNORMAL HIGH (ref <18)

## 2019-03-20 MED ORDER — POTASSIUM CHLORIDE CRYS ER 10 MEQ PO TBCR
10.0000 meq | EXTENDED_RELEASE_TABLET | Freq: Two times a day (BID) | ORAL | Status: DC
  Administered 2019-03-21 – 2019-03-22 (×3): 10 meq via ORAL
  Filled 2019-03-20 (×3): qty 1

## 2019-03-20 MED ORDER — INSULIN ASPART 100 UNIT/ML ~~LOC~~ SOLN
0.0000 [IU] | Freq: Three times a day (TID) | SUBCUTANEOUS | Status: DC
  Administered 2019-03-21: 3 [IU] via SUBCUTANEOUS
  Administered 2019-03-21: 5 [IU] via SUBCUTANEOUS
  Administered 2019-03-22: 2 [IU] via SUBCUTANEOUS
  Administered 2019-03-22: 3 [IU] via SUBCUTANEOUS
  Administered 2019-03-22 – 2019-03-24 (×5): 2 [IU] via SUBCUTANEOUS

## 2019-03-20 MED ORDER — SODIUM CHLORIDE 0.9% FLUSH: 3.0000 mL | INTRAVENOUS | Status: DC | PRN

## 2019-03-20 MED ORDER — ATORVASTATIN CALCIUM 40 MG PO TABS
40.0000 mg | ORAL_TABLET | Freq: Every day | ORAL | Status: DC
  Administered 2019-03-20 – 2019-03-23 (×4): 40 mg via ORAL
  Filled 2019-03-20 (×4): qty 1

## 2019-03-20 MED ORDER — INSULIN ASPART 100 UNIT/ML ~~LOC~~ SOLN
0.0000 [IU] | Freq: Every day | SUBCUTANEOUS | Status: DC
  Administered 2019-03-20: 2 [IU] via SUBCUTANEOUS

## 2019-03-20 MED ORDER — ENOXAPARIN SODIUM 40 MG/0.4ML ~~LOC~~ SOLN
40.0000 mg | SUBCUTANEOUS | Status: DC
  Administered 2019-03-20 – 2019-03-22 (×3): 40 mg via SUBCUTANEOUS
  Filled 2019-03-20 (×3): qty 0.4

## 2019-03-20 MED ORDER — SODIUM CHLORIDE 0.9% FLUSH
3.0000 mL | Freq: Two times a day (BID) | INTRAVENOUS | Status: DC
  Administered 2019-03-20 – 2019-03-23 (×7): 3 mL via INTRAVENOUS

## 2019-03-20 MED ORDER — FUROSEMIDE 10 MG/ML IJ SOLN
40.0000 mg | Freq: Once | INTRAMUSCULAR | Status: AC
  Administered 2019-03-20: 40 mg via INTRAVENOUS
  Filled 2019-03-20: qty 4

## 2019-03-20 MED ORDER — SODIUM CHLORIDE 0.9 % IV SOLN: 250.0000 mL | INTRAVENOUS | Status: DC | PRN

## 2019-03-20 MED ORDER — ALPRAZOLAM 0.5 MG PO TABS
1.0000 mg | ORAL_TABLET | Freq: Every day | ORAL | Status: DC
  Administered 2019-03-20 – 2019-03-23 (×4): 1 mg via ORAL
  Filled 2019-03-20 (×2): qty 1
  Filled 2019-03-20: qty 2
  Filled 2019-03-20: qty 1

## 2019-03-20 MED ORDER — POTASSIUM CHLORIDE CRYS ER 20 MEQ PO TBCR
20.0000 meq | EXTENDED_RELEASE_TABLET | Freq: Once | ORAL | Status: AC
  Administered 2019-03-20: 20 meq via ORAL
  Filled 2019-03-20: qty 1

## 2019-03-20 MED ORDER — FUROSEMIDE 10 MG/ML IJ SOLN
40.0000 mg | Freq: Two times a day (BID) | INTRAMUSCULAR | Status: DC
  Administered 2019-03-21 – 2019-03-22 (×4): 40 mg via INTRAVENOUS
  Filled 2019-03-20 (×4): qty 4

## 2019-03-20 MED ORDER — PANTOPRAZOLE SODIUM 40 MG PO TBEC
40.0000 mg | DELAYED_RELEASE_TABLET | Freq: Every day | ORAL | Status: DC
  Administered 2019-03-20 – 2019-03-24 (×5): 40 mg via ORAL
  Filled 2019-03-20 (×5): qty 1

## 2019-03-20 MED ORDER — GLIPIZIDE 5 MG PO TABS
5.0000 mg | ORAL_TABLET | Freq: Two times a day (BID) | ORAL | Status: DC
  Administered 2019-03-21 – 2019-03-24 (×7): 5 mg via ORAL
  Filled 2019-03-20 (×8): qty 1

## 2019-03-20 MED ORDER — ONDANSETRON HCL 4 MG/2ML IJ SOLN: 4.0000 mg | Freq: Four times a day (QID) | INTRAMUSCULAR | Status: DC | PRN

## 2019-03-20 MED ORDER — ACETAMINOPHEN 325 MG PO TABS
650.0000 mg | ORAL_TABLET | ORAL | Status: DC | PRN
  Administered 2019-03-21 – 2019-03-22 (×3): 650 mg via ORAL
  Filled 2019-03-20 (×3): qty 2

## 2019-03-20 MED ORDER — NICOTINE 21 MG/24HR TD PT24
21.0000 mg | MEDICATED_PATCH | Freq: Every day | TRANSDERMAL | Status: DC
  Administered 2019-03-20 – 2019-03-24 (×5): 21 mg via TRANSDERMAL
  Filled 2019-03-20 (×5): qty 1

## 2019-03-20 MED ORDER — LORAZEPAM 0.5 MG PO TABS
0.5000 mg | ORAL_TABLET | Freq: Three times a day (TID) | ORAL | Status: DC
  Administered 2019-03-20 – 2019-03-24 (×11): 0.5 mg via ORAL
  Filled 2019-03-20 (×11): qty 1

## 2019-03-20 MED ORDER — LISINOPRIL 5 MG PO TABS
5.0000 mg | ORAL_TABLET | Freq: Every morning | ORAL | Status: DC
  Administered 2019-03-21 – 2019-03-22 (×2): 5 mg via ORAL
  Filled 2019-03-20 (×2): qty 1

## 2019-03-20 MED ORDER — POTASSIUM CHLORIDE CRYS ER 20 MEQ PO TBCR
40.0000 meq | EXTENDED_RELEASE_TABLET | Freq: Once | ORAL | Status: AC
  Administered 2019-03-20: 40 meq via ORAL
  Filled 2019-03-20: qty 2

## 2019-03-20 MED ORDER — MELATONIN 3 MG PO TABS
3.0000 mg | ORAL_TABLET | Freq: Every day | ORAL | Status: DC
  Administered 2019-03-20 – 2019-03-23 (×4): 3 mg via ORAL
  Filled 2019-03-20 (×5): qty 1

## 2019-03-20 MED ORDER — FLUOXETINE HCL 20 MG PO CAPS
20.0000 mg | ORAL_CAPSULE | Freq: Every morning | ORAL | Status: DC
  Administered 2019-03-21 – 2019-03-24 (×4): 20 mg via ORAL
  Filled 2019-03-20 (×4): qty 1

## 2019-03-20 NOTE — H&P (Signed)
TRH H&P   Patient Demographics:    [name], is a 59 y.o. female  MRN: [id number]   DOB - 10/11/1960  Admit Date - 03/20/2019  Outpatient Primary MD for the patient is [name], MD  Referring MD/NP/PA: Dr Roderic Palau  Patient coming from: Home  Chief Complaint  Patient presents with  . Shortness of Breath      HPI:    [name]  is a 59 y.o. female, with medical history significant for DM, hypertension, anal fissure, seizures, hypertension, depression, presented to the ED with complaints of shortness of breath, reports it has been going on for a week, reports she has called her PCP, where she was prescribed azithromycin and prednisone, reports she was supposed to start them today, reports she tested negative for COVID-19 on Tuesday, report dyspnea progressive over 1 week, mainly exertional, she is unaware of any orthopnea, as she unable to sleep flat at baseline given chronic neck pain issues, reports lower extremity edema as well, orts occasional cough, denies fever, chills, nausea, vomiting, diarrhea, or any COVID-19 contacts, reports she is still smoking, does not follow a low-salt diet. - in ED a proBNP was elevated, chest x-ray significant for cardiomegaly with volume overload, she was saturating 88% on room air, requiring 2 L nasal cannula, she reports some improvement after receiving IV Lasix, I was consulted to admit for new onset CHF.    Review of systems:    In addition to the HPI above,  No Fever-chills, No Headache, No changes with Vision or hearing, No problems swallowing food or Liquids, No Chest pain,reports  Cough and  Shortness of Breath, No Abdominal pain, No Nausea or Vommitting, Bowel movements are regular, No Blood in stool or Urine, No dysuria, No new skin rashes or bruises, No new joints pains-aches,  No new weakness, tingling, numbness in any  extremity, No recent weight gain or loss, No polyuria, polydypsia or polyphagia, No significant Mental Stressors.  A full 10 point Review of Systems was done, except as stated above, all other Review of Systems were negative.   With Past History of the following :    Past Medical History:  Diagnosis Date  . Anal fissure   . Anxiety   . Depression   . Diabetes mellitus   . GERD (gastroesophageal reflux disease)   . Hypercholesteremia   . Hypertension   . Seizures ([place])    none since age 68-no meds since age 29 and no seizures since ge 6      Past Surgical History:  Procedure Laterality Date  . BACK SURGERY  2005   lumbar disckectomy  . COLONOSCOPY  10/01/2011   Procedure: COLONOSCOPY;  Surgeon: [name], MD;  [facility];  Service: Endoscopy;  Laterality: N/A;  730  . COLONOSCOPY N/A 10/04/2016   Procedure: COLONOSCOPY;  Surgeon: [name], MD;  Location:  [facility];  Service: Endoscopy;  Laterality: N/A;  10:30  . DILATION AND CURETTAGE OF UTERUS    . FOOT BONE EXCISION     right  . POLYPECTOMY  10/04/2016   Procedure: POLYPECTOMY;  Surgeon: [name], MD;  Location: [facility];  Service: Endoscopy;;  colon      Social History:     Social History   Tobacco Use  . Smoking status: Current Every Day Smoker    Packs/day: 0.50    Years: 37.00    Pack years: 18.50    Types: Cigarettes  . Smokeless tobacco: Never Used  Substance Use Topics  . Alcohol use: No        Family History :     Family History  Problem Relation Age of Onset  . Arthritis Mother   . Hernia Mother   . Constipation Mother   . Colon cancer Neg Hx      Home Medications:   Prior to Admission medications   Medication Sig Start Date End Date Taking? Authorizing Provider  albuterol (VENTOLIN HFA) 108 (90 Base) MCG/ACT inhaler Inhale 2 puffs into the lungs every 6 (six) hours as needed for wheezing or shortness of breath.   Yes [provider]    ALPRAZolam Duanne Moron) 1 MG tablet Take 1 mg by mouth at bedtime.   Yes [provider]  atorvastatin (LIPITOR) 40 MG tablet Take 40 mg by mouth daily.   Yes [provider]  azithromycin (ZITHROMAX) 250 MG tablet Take 250-500 mg by mouth See admin instructions. Prescribed and filled on 03/20/2019   Yes [provider]  Cholecalciferol (VITAMIN D3) 125 MCG (5000 UT) CAPS Take 1 capsule by mouth daily.   Yes [provider]  dicyclomine (BENTYL) 10 MG capsule TAKE ONE CAPSULE BY MOUTH TWICE A DAY BEFORE A MEAL Patient taking differently: Take 10 mg by mouth 3 (three) times daily before meals.  09/30/17  Yes Setzer, Terri L, NP  FLUoxetine (PROZAC) 20 MG capsule Take 20 mg by mouth every morning.    Yes [provider]  fluticasone (FLONASE) 50 MCG/ACT nasal spray Place 2 sprays into both nostrils daily as needed for allergies or rhinitis.   Yes [provider]  glipiZIDE (GLUCOTROL) 5 MG tablet Take 5 mg by mouth 2 (two) times daily before a meal.    Yes [provider]  ibuprofen (ADVIL) 200 MG tablet Take 200 mg by mouth every 6 (six) hours as needed for mild pain or moderate pain.   Yes [provider]  lisinopril (PRINIVIL,ZESTRIL) 5 MG tablet Take 5 mg by mouth every morning.    Yes [provider]  LORazepam (ATIVAN) 0.5 MG tablet Take 0.5 mg by mouth 3 (three) times daily.    Yes [provider]  Melatonin (SM MELATONIN) 3 MG TABS Take 3 mg by mouth at bedtime.   Yes [provider]  pantoprazole (PROTONIX) 40 MG tablet Take 1 tablet (40 mg total) by mouth daily. 05/19/13  Yes Black, Lezlie Octave, NP  pioglitazone (ACTOS) 45 MG tablet Take 45 mg by mouth daily.    Yes [provider]  predniSONE (DELTASONE) 10 MG tablet Take 10 mg by mouth daily with breakfast. [id number]   Yes [provider]     Allergies:    No Known Allergies   Physical Exam:   Vitals  Blood  pressure (!) 143/77, pulse 92, temperature 98.3 F (36.8 C), temperature source Oral, resp. rate  18, height 5\' 7"  (1.702 m), weight 86.2 kg, last menstrual period 09/25/2011, SpO2 97 %.   1. General developed female, lying in bed in NAD,   2. Normal affect and insight, Not Suicidal or Homicidal, Awake Alert, Oriented X 3.  3. No F.N deficits, ALL C.Nerves Intact, Strength 5/5 all 4 extremities, Sensation intact all 4 extremities, Plantars down going.  4. Ears and Eyes appear Normal, Conjunctivae clear, PERRLA. Moist Oral Mucosa.  5. Supple Neck, No JVD, No cervical lymphadenopathy appriciated, No Carotid Bruits.  6. Symmetrical Chest wall movement, Good air movement bilaterally, mild bibasilar crackles, +1 edema bilaterally  7. RRR, No Gallops, Rubs or Murmurs, No Parasternal Heave.  8. Positive Bowel Sounds, Abdomen Soft, No tenderness, No organomegaly appriciated,No rebound -guarding or rigidity.  9.  No Cyanosis, Normal Skin Turgor, No Skin Rash or Bruise.  10. Good muscle tone,  joints appear normal , no effusions, Normal ROM.  11. No Palpable Lymph Nodes in Neck or Axillae     Data Review:    CBC Recent Labs  Lab 03/20/19 1658  WBC 8.5  HGB 12.5  HCT 39.8  PLT 267  MCV 83.4  MCH 26.2  MCHC 31.4  RDW 14.8  LYMPHSABS 1.2  MONOABS 0.5  EOSABS 0.1  BASOSABS 0.1   ------------------------------------------------------------------------------------------------------------------  Chemistries  Recent Labs  Lab 03/20/19 1658  NA 140  K 3.1*  CL 101  CO2 29  GLUCOSE 203*  BUN 17  CREATININE 0.91  CALCIUM 8.9  AST 17  ALT 23  ALKPHOS 104  BILITOT 0.7   ------------------------------------------------------------------------------------------------------------------ estimated creatinine clearance is 76.9 mL/min (by C-G formula based on SCr of 0.91  mg/dL). ------------------------------------------------------------------------------------------------------------------ No results for input(s): TSH, T4TOTAL, T3FREE, THYROIDAB in the last 72 hours.  Invalid input(s): FREET3  Coagulation profile No results for input(s): INR, PROTIME in the last 168 hours. ------------------------------------------------------------------------------------------------------------------- Recent Labs    03/20/19 1658  DDIMER 0.90*   -------------------------------------------------------------------------------------------------------------------  Cardiac Enzymes No results for input(s): CKMB, TROPONINI, MYOGLOBIN in the last 168 hours.  Invalid input(s): CK ------------------------------------------------------------------------------------------------------------------    Component Value Date/Time   BNP 464.0 (H) 03/20/2019 1658     ---------------------------------------------------------------------------------------------------------------  Urinalysis    Component Value Date/Time   COLORURINE YELLOW 05/14/2013 1205   APPEARANCEUR CLEAR 05/14/2013 1205   LABSPEC >1.030 (H) 05/14/2013 1205   PHURINE 5.5 05/14/2013 1205   GLUCOSEU NEGATIVE 05/14/2013 1205   HGBUR NEGATIVE 05/14/2013 1205   [place] 05/14/2013 1205   [place] 05/14/2013 1205   PROTEINUR NEGATIVE 05/14/2013 1205   UROBILINOGEN 0.2 05/14/2013 1205   NITRITE NEGATIVE 05/14/2013 1205   LEUKOCYTESUR NEGATIVE 05/14/2013 1205    ----------------------------------------------------------------------------------------------------------------   Imaging Results:    DG Chest Portable 1 View  Result Date: 03/20/2019 CLINICAL DATA:  59 year old female with shortness of breath. EXAM: PORTABLE CHEST 1 VIEW COMPARISON:  None. FINDINGS: There is cardiomegaly with vascular congestion and mild edema. Small bilateral pleural effusions may be present. There is no  pneumothorax. No acute osseous pathology. IMPRESSION: Cardiomegaly with findings of CHF. Superimposed pneumonia is not excluded. Clinical correlation is recommended. Electronically Signed   By: Anner Crete M.D.   On: 03/20/2019 17:46    My personal review of EKG: Rhythm NSR, Rate  99/min, QTc 502 , with IVCD   Assessment & Plan:    Active Problems:   DM (diabetes mellitus) ([place])   HTN (hypertension)   Tobacco abuse   Acute CHF (congestive heart failure) ([facility])    Acute  CHF -Patient presents with dyspnea, saturating 88% on room air, imaging significant for cardiomegaly and volume overload, and lower extremity edema, findings suspicious for new onset acute CHF. -On Lasix 40 mg IV twice daily, monitor potassium and replete as needed. -Daily weight, strict ins and outs. -Obtain 2D echo -She was counseled about low-salt diet  Tobacco abuse -He was counseled, will start on nicotine patch  Diabetes mellitus -Continue with glipizide, hold Actos due to CHF, check A1c, would add insulin sliding scale.  Hypertension -Continue with home medications  Hyperlipidemia -Continue with statin  Anxiety/depression -Continue with home medications   AM Labs Ordered, also please review Full Orders  Family Communication: Admission, patients condition and plan of care including tests being ordered have been discussed with the patient  who indicate understanding and agree with the plan and Code Status.  Code Status Full  Likely DC to  home  Condition GUARDED    Consults called: None  Admission status: inpatient   Time spent in minutes : 55 minutes   Phillips Climes M.D on 03/20/2019 at 7:36 PM  Between 7am to 7pm - Pager - [phone]. After 7pm go to [email or web address] - password [facility]  [facility] - Office  [phone]

## 2019-03-20 NOTE — Plan of Care (Signed)
Plan Reviewed by RN

## 2019-03-20 NOTE — ED Triage Notes (Signed)
Patient complains of shortness of breath times one week, but worse today. Covid test on 03/17/19 was negative. Patient was 88 percent on room air when [facility] arrived with a CBG of 182. Albuterol given in route. Patient is 97 percent on 2L now.

## 2019-03-20 NOTE — ED Provider Notes (Signed)
[facility] Provider Note   CSN: [id number] Arrival date & time: 03/20/19  1631     History Chief Complaint  Patient presents with  . Shortness of Breath    [name] is a 59 y.o. female.  Patient complains of shortness of breath.  Patient states she has been short of breath for a week  The history is provided by the patient. No language interpreter was used.  Shortness of Breath Onset quality:  Sudden Timing:  Constant Progression:  Worsening Chronicity:  New Context: activity   Relieved by:  Nothing Worsened by:  Nothing Ineffective treatments:  None tried Associated symptoms: no abdominal pain, no chest pain, no cough, no headaches and no rash        Past Medical History:  Diagnosis Date  . Anal fissure   . Anxiety   . Depression   . Diabetes mellitus   . GERD (gastroesophageal reflux disease)   . Hypercholesteremia   . Hypertension   . Seizures ([place])    none since age 42-no meds since age 45 and no seizures since ge 6    Patient Active Problem List   Diagnosis Date Noted  . Acute CHF (congestive heart failure) ([place]) 03/20/2019  . Hypokalemia 04/30/2018  . Hypomagnesemia 04/30/2018  . Cellulitis of left buttock 04/30/2018  . Cellulitis of right buttock 04/30/2018  . Unspecified open wound of left buttock, initial encounter   . Left buttock abscess 04/29/2018  . History of colonic polyps 06/14/2016  . Diverticulitis of colon (without mention of hemorrhage)(562.11) 05/18/2013  . Acute renal failure ([place]) 05/14/2013  . Hyperkalemia 05/14/2013  . Hypotension 05/14/2013  . Tobacco abuse 05/14/2013  . DM (diabetes mellitus) ([place]) 11/05/2011  . HTN (hypertension) 11/05/2011  . Diarrhea 11/05/2011  . Anal fissure 11/05/2011    Past Surgical History:  Procedure Laterality Date  . BACK SURGERY  2005   lumbar disckectomy  . COLONOSCOPY  10/01/2011   Procedure: COLONOSCOPY;  Surgeon: [name], MD;  [facility];   Service: Endoscopy;  Laterality: N/A;  730  . COLONOSCOPY N/A 10/04/2016   Procedure: COLONOSCOPY;  Surgeon: [name], MD;  Location: [facility];  Service: Endoscopy;  Laterality: N/A;  10:30  . DILATION AND CURETTAGE OF UTERUS    . FOOT BONE EXCISION     right  . POLYPECTOMY  10/04/2016   Procedure: POLYPECTOMY;  Surgeon: [name], MD;  Location: [facility];  Service: Endoscopy;;  colon     OB History   No obstetric history on file.     Family History  Problem Relation Age of Onset  . Arthritis Mother   . Hernia Mother   . Constipation Mother   . Colon cancer Neg Hx     Social History   Tobacco Use  . Smoking status: Current Every Day Smoker    Packs/day: 0.50    Years: 37.00    Pack years: 18.50    Types: Cigarettes  . Smokeless tobacco: Never Used  Substance Use Topics  . Alcohol use: No  . Drug use: No    Home Medications Prior to Admission medications   Medication Sig Start Date End Date Taking? Authorizing Provider  albuterol (VENTOLIN HFA) 108 (90 Base) MCG/ACT inhaler Inhale 2 puffs into the lungs every 6 (six) hours as needed for wheezing or shortness of breath.   Yes [provider]  ALPRAZolam Duanne Moron) 1 MG tablet Take 1 mg by mouth  at bedtime.   Yes [provider]  atorvastatin (LIPITOR) 40 MG tablet Take 40 mg by mouth daily.   Yes [provider]  azithromycin (ZITHROMAX) 250 MG tablet Take 250-500 mg by mouth See admin instructions. Prescribed and filled on 03/20/2019   Yes [provider]  Cholecalciferol (VITAMIN D3) 125 MCG (5000 UT) CAPS Take 1 capsule by mouth daily.   Yes [provider]  dicyclomine (BENTYL) 10 MG capsule TAKE ONE CAPSULE BY MOUTH TWICE A DAY BEFORE A MEAL Patient taking differently: Take 10 mg by mouth 3 (three) times daily before meals.  09/30/17  Yes Setzer, Terri L, NP  FLUoxetine (PROZAC) 20 MG capsule Take 20 mg by mouth every morning.    Yes [provider]  fluticasone (FLONASE) 50 MCG/ACT nasal spray Place 2 sprays into both nostrils daily as needed for allergies or rhinitis.   Yes [provider]  glipiZIDE (GLUCOTROL) 5 MG tablet Take 5 mg by mouth 2 (two) times daily before a meal.    Yes [provider]  ibuprofen (ADVIL) 200 MG tablet Take 200 mg by mouth every 6 (six) hours as needed for mild pain or moderate pain.   Yes [provider]  lisinopril (PRINIVIL,ZESTRIL) 5 MG tablet Take 5 mg by mouth every morning.    Yes [provider]  LORazepam (ATIVAN) 0.5 MG tablet Take 0.5 mg by mouth 3 (three) times daily.    Yes [provider]  Melatonin (SM MELATONIN) 3 MG TABS Take 3 mg by mouth at bedtime.   Yes [provider]  pantoprazole (PROTONIX) 40 MG tablet Take 1 tablet (40 mg total) by mouth daily. 05/19/13  Yes Black, Lezlie Octave, NP  pioglitazone (ACTOS) 45 MG tablet Take 45 mg by mouth daily.    Yes [provider]  predniSONE (DELTASONE) 10 MG tablet Take 10 mg by mouth daily with breakfast. [id number]   Yes [provider]    Allergies    Patient has no known allergies.  Review of Systems   Review of Systems  Constitutional: Negative for appetite change and fatigue.  HENT: Negative for congestion, ear discharge and sinus pressure.   Eyes: Negative for discharge.  Respiratory: Positive for shortness of breath. Negative for cough.   Cardiovascular: Negative for chest pain.  Gastrointestinal: Negative for abdominal pain and diarrhea.  Genitourinary: Negative for frequency and hematuria.  Musculoskeletal: Negative for back pain.  Skin: Negative for rash.  Neurological: Negative for seizures and headaches.  Psychiatric/Behavioral: Negative for hallucinations.    Physical Exam Updated Vital Signs BP (!) 143/77   Pulse 92   Temp 98.3 F (36.8 C) (Oral)   Resp 18   Ht 5\' 7"  (1.702 m)   Wt 86.2 kg   LMP 09/25/2011   SpO2 97%   BMI 29.76 kg/m     Physical Exam Vitals and nursing note reviewed.  Constitutional:      Appearance: She is well-developed.  HENT:     Head: Normocephalic.     Nose: Nose normal.  Eyes:     General: No scleral icterus.    Conjunctiva/sclera: Conjunctivae normal.  Neck:     Thyroid: No thyromegaly.  Cardiovascular:     Rate and Rhythm: Normal rate and regular rhythm.     Heart sounds: No murmur. No friction rub. No gallop.   Pulmonary:     Breath sounds: No stridor. Rales present. No wheezing.  Chest:     Chest wall:  No tenderness.  Abdominal:     General: There is no distension.     Tenderness: There is no abdominal tenderness. There is no rebound.  Musculoskeletal:        General: Normal range of motion.     Cervical back: Neck supple.  Lymphadenopathy:     Cervical: No cervical adenopathy.  Skin:    Findings: No erythema or rash.  Neurological:     Mental Status: She is alert and oriented to person, place, and time.     Motor: No abnormal muscle tone.     Coordination: Coordination normal.  Psychiatric:        Behavior: Behavior normal.     ED Results / Procedures / Treatments   Labs (all labs ordered are listed, but only abnormal results are displayed) Labs Reviewed  COMPREHENSIVE METABOLIC PANEL - Abnormal; Notable for the following components:      Result Value   Potassium 3.1 (*)    Glucose, Bld 203 (*)    All other components within normal limits  D-DIMER, QUANTITATIVE (NOT AT [facility]) - Abnormal; Notable for the following components:   D-Dimer, Quant 0.90 (*)    All other components within normal limits  BRAIN NATRIURETIC PEPTIDE - Abnormal; Notable for the following components:   B Natriuretic Peptide 464.0 (*)    All other components within normal limits  TROPONIN I (HIGH SENSITIVITY) - Abnormal; Notable for the following components:   Troponin I (High Sensitivity) 27 (*)    All other components within normal limits  RESPIRATORY PANEL BY RT PCR (FLU A&B, COVID)  CBC  WITH DIFFERENTIAL/PLATELET  TROPONIN I (HIGH SENSITIVITY)    EKG EKG Interpretation  Date/Time:  Friday March 20 2019 16:55:57 EST Ventricular Rate:  99 PR Interval:    QRS Duration: 127 QT Interval:  391 QTC Calculation: 502 R Axis:   -58 Text Interpretation: Sinus rhythm Consider right atrial enlargement Nonspecific IVCD with LAD LVH with secondary repolarization abnormality Anterior Q waves, possibly due to LVH Baseline wander in lead(s) V3 Confirmed by Milton Ferguson [phone]) on 03/20/2019 5:05:03 PM   Radiology DG Chest Portable 1 View  Result Date: 03/20/2019 CLINICAL DATA:  59 year old female with shortness of breath. EXAM: PORTABLE CHEST 1 VIEW COMPARISON:  None. FINDINGS: There is cardiomegaly with vascular congestion and mild edema. Small bilateral pleural effusions may be present. There is no pneumothorax. No acute osseous pathology. IMPRESSION: Cardiomegaly with findings of CHF. Superimposed pneumonia is not excluded. Clinical correlation is recommended. Electronically Signed   By: Anner Crete M.D.   On: 03/20/2019 17:46    Procedures Procedures (including critical care time)  Medications Ordered in ED Medications  potassium chloride SA (KLOR-CON) CR tablet 40 mEq (40 mEq Oral Given 03/20/19 1856)  furosemide (LASIX) injection 40 mg (40 mg Intravenous Given 03/20/19 1857)    ED Course  I have reviewed the triage vital signs and the nursing notes.  Pertinent labs & imaging results that were available during my care of the patient were reviewed by me and considered in my medical decision making (see chart for details).    CRITICAL CARE Performed by: Milton Ferguson Total critical care time: 45 minutes Critical care time was exclusive of separately billable procedures and treating other patients. Critical care was necessary to treat or prevent imminent or life-threatening deterioration. Critical care was time spent personally by me on the following activities:  development of treatment plan with patient and/or surrogate as well as nursing, discussions with consultants,  evaluation of patient's response to treatment, examination of patient, obtaining history from patient or surrogate, ordering and performing treatments and interventions, ordering and review of laboratory studies, ordering and review of radiographic studies, pulse oximetry and re-evaluation of patient's condition.  MDM Rules/Calculators/A&P                     Patient with shortness of breath and new onset congestive heart failure.  She will be admitted by medicine Final Clinical Impression(s) / ED Diagnoses Final diagnoses:  Systolic congestive heart failure, unspecified HF chronicity ([place])    Rx / DC Orders ED Discharge Orders    None       Milton Ferguson, MD 03/20/19 1905

## 2019-03-21 ENCOUNTER — Inpatient Hospital Stay (HOSPITAL_COMMUNITY): Payer: Medicaid Other

## 2019-03-21 ENCOUNTER — Other Ambulatory Visit: Payer: Self-pay

## 2019-03-21 DIAGNOSIS — I371 Nonrheumatic pulmonary valve insufficiency: Secondary | ICD-10-CM

## 2019-03-21 DIAGNOSIS — I340 Nonrheumatic mitral (valve) insufficiency: Secondary | ICD-10-CM

## 2019-03-21 LAB — GLUCOSE, CAPILLARY
Glucose-Capillary: 225 mg/dL — ABNORMAL HIGH (ref 70–99)
Glucose-Capillary: 177 mg/dL — ABNORMAL HIGH (ref 70–99)
Glucose-Capillary: 109 mg/dL — ABNORMAL HIGH (ref 70–99)
Glucose-Capillary: 204 mg/dL — ABNORMAL HIGH (ref 70–99)
Glucose-Capillary: 180 mg/dL — ABNORMAL HIGH (ref 70–99)

## 2019-03-21 LAB — BASIC METABOLIC PANEL
Anion gap: 10 (ref 5–15)
BUN: 15 mg/dL (ref 6–20)
CO2: 31 mmol/L (ref 22–32)
Calcium: 9 mg/dL (ref 8.9–10.3)
Chloride: 99 mmol/L (ref 98–111)
Creatinine, Ser: 0.93 mg/dL (ref 0.44–1.00)
GFR calc Af Amer: >60 mL/min (ref >60)
GFR calc non Af Amer: >60 mL/min (ref >60)
Glucose, Bld: 162 mg/dL — ABNORMAL HIGH (ref 70–99)
Potassium: 3.3 mmol/L — ABNORMAL LOW (ref 3.5–5.1)
Sodium: 140 mmol/L (ref 135–145)

## 2019-03-21 LAB — ECHOCARDIOGRAM COMPLETE
Height: 67 in
Weight: 3181.68 oz

## 2019-03-21 LAB — HEMOGLOBIN A1C
Hgb A1c MFr Bld: 7.8 % — ABNORMAL HIGH (ref 4.8–5.6)
Mean Plasma Glucose: 177.16 mg/dL

## 2019-03-21 MED ORDER — DICYCLOMINE HCL 10 MG PO CAPS
10.0000 mg | ORAL_CAPSULE | Freq: Three times a day (TID) | ORAL | Status: DC | PRN
  Administered 2019-03-22 – 2019-03-24 (×3): 10 mg via ORAL
  Filled 2019-03-21 (×3): qty 1

## 2019-03-21 MED ORDER — CARVEDILOL 6.25 MG PO TABS
6.2500 mg | ORAL_TABLET | Freq: Two times a day (BID) | ORAL | Status: DC
  Administered 2019-03-21 – 2019-03-24 (×6): 6.25 mg via ORAL
  Filled 2019-03-21: qty 1
  Filled 2019-03-21 (×3): qty 2
  Filled 2019-03-21: qty 1
  Filled 2019-03-21: qty 2

## 2019-03-21 NOTE — Progress Notes (Signed)
PROGRESS NOTE    [name]  [id number] DOB: 30-Aug-1960 DOA: 03/20/2019 PCP: [name], MD     Brief Narrative:  As per H&P written by Dr. Waldron Labs 03/20/2019 59 y.o. female, with medical history significant forDM, hypertension, anal fissure, seizures, hypertension, depression, presented to the ED with complaints of shortness of breath, reports it has been going on for a week, reports she has called her PCP, where she was prescribed azithromycin and prednisone, reports she was supposed to start them today, reports she tested negative for COVID-19 on Tuesday, report dyspnea progressive over 1 week, mainly exertional, she is unaware of any orthopnea, as she unable to sleep flat at baseline given chronic neck pain issues, reports lower extremity edema as well, orts occasional cough, denies fever, chills, nausea, vomiting, diarrhea, or any COVID-19 contacts, reports she is still smoking, does not follow a low-salt diet. - in ED a proBNP was elevated, chest x-ray significant for cardiomegaly with volume overload, she was saturating 88% on room air, requiring 2 L nasal cannula, she reports some improvement after receiving IV Lasix, I was consulted to admit for new onset CHF.  Assessment & Plan: 1-lower extremity edema, swelling and acute respiratory failure with hypoxia -In the setting of acute systolic heart failure -No chest pain no palpitations -Telemetry without incidents. -Continue IV diuresis -Close monitoring of renal function and electrolytes -Low-sodium diet, daily weights and strict I's and O's to be continued while inpatient.  Long discussion with patient about this. -Patient has been started on carvedilol and will continue the use of ACE inhibitor. -Cardiology service has been consulted to further assist with evaluation and management of acute systolic heart failure.  2-DM (diabetes mellitus) (HCC) -A1c 7.8 -Continue sliding scale insulin and glipizide  3-HTN  (hypertension) -Stable and well-controlled -Continue current antihypertensive regimen and follow vital signs.  4-Tobacco abuse -Cessation counseling has been provided. -Continue nicotine patch.  5-IBS/GERD -No abdominal pain -Resume Bentyl -Continue PPI.  6-anxiety/depression -Continue as needed Xanax and Ativan. -Continue Prozac -No suicidal regional hallucinations.  7-hyperlipidemia -Continue Lipitor.  8-hypokalemia -In the setting of diuresis -Continue daily supplementation and follow electrolytes.   DVT prophylaxis: Lovenox Code Status: Full code Family Communication: No family at bedside. Disposition Plan: Remains inpatient, continue IV diuresis, cardiology service has been consulted to further assist with further evaluation and management of acute systolic heart failure.start Low-dose carvedilol, continue lisinopril, follow renal function and electrolytes closely while diuresing and further replete as needed.  Consultants:   Cardiology has been consulted.  Procedures:   2D echo:    1. Left ventricular ejection fraction, by visual estimation, is 20 to 25%. The left ventricle has severely decreased function. There is mildly increased left ventricular hypertrophy.  2. Elevated left ventricular end-diastolic pressure.  3. Left ventricular diastolic parameters are consistent with Grade III diastolic dysfunction (restrictive).  4. The left ventricle demonstrates global hypokinesis.  5. Global right ventricle has mildly reduced systolic function.The right ventricular size is normal. No increase in right ventricular wall thickness.  6. Left atrial size was normal.  7. Right atrial size was normal.  8. The mitral valve is grossly normal. Moderate mitral valve regurgitation.  9. The tricuspid valve is grossly normal. 10. The tricuspid valve is grossly normal. Tricuspid valve regurgitation is trivial. 11. The aortic valve is tricuspid. Aortic valve regurgitation is not  visualized. No evidence of aortic valve sclerosis or stenosis. 12. Pulmonic regurgitation is mild. 13. The pulmonic valve was grossly normal. Pulmonic valve regurgitation  is mild. 14. The inferior vena cava is normal in size with greater than 50% respiratory variability, suggesting right atrial pressure of 3 mmHg.  Antimicrobials:  Anti-infectives (From admission, onward)   None       Subjective: Patient reports some ongoing intermittent episode of diarrhea (chronic for her due to IBS), no chest pain, no palpitations, no nausea, no vomiting.  Still complaining of orthopnea, short of breath on exertion, requiring 1.5 L oxygen supplementation and have been mild lower extremity edema.  Objective: Vitals:   03/20/19 2255 03/21/19 0500 03/21/19 0600 03/21/19 0700  BP:   124/65   Pulse: 83  81   Resp: 20  18   Temp:   98.1 F (36.7 C)   TempSrc:   Oral   SpO2: 94%  96% 95%  Weight:  90.2 kg    Height:        Intake/Output Summary (Last 24 hours) at 03/21/2019 1515 Last data filed at 03/21/2019 1101 Gross per 24 hour  Intake --  Output 1501 ml  Net -1501 ml   Filed Weights   03/20/19 2108 03/20/19 2206 03/21/19 0500  Weight: 91.4 kg 90.4 kg 90.2 kg    Examination: General exam: Alert, awake, oriented x 3; no chest pain, no palpitations.  Patient reports he still shortness of breath on exertion, requiring 1.5 oxygen supplementation, complaining of orthopnea and mild lower extremity swelling. Respiratory system: No wheezing, decreased breath sounds at the bases, no using accessory muscles. Cardiovascular system:RRR. No murmurs, rubs, gallops.  No JVD on exam. Gastrointestinal system: Abdomen is nondistended, soft and nontender. No organomegaly or masses felt. Normal bowel sounds heard. Central nervous system: Alert and oriented. No focal neurological deficits. Extremities: No cyanosis or clubbing.  Trace edema bilaterally appreciated. Skin: No rashes, lesions or  ulcers Psychiatry: Judgement and insight appear normal. Mood & affect appropriate.     Data Reviewed: I have personally reviewed following labs and imaging studies  CBC: Recent Labs  Lab 03/20/19 1658  WBC 8.5  NEUTROABS 6.6  HGB 12.5  HCT 39.8  MCV 83.4  PLT 99991111   Basic Metabolic Panel: Recent Labs  Lab 03/20/19 1658 03/21/19 0616  NA 140 140  K 3.1* 3.3*  CL 101 99  CO2 29 31  GLUCOSE 203* 162*  BUN 17 15  CREATININE 0.91 0.93  CALCIUM 8.9 9.0   GFR: Estimated Creatinine Clearance: 76.9 mL/min (by C-G formula based on SCr of 0.93 mg/dL).   Liver Function Tests: Recent Labs  Lab 03/20/19 1658  AST 17  ALT 23  ALKPHOS 104  BILITOT 0.7  PROT 6.7  ALBUMIN 3.5   HbA1C: Recent Labs    03/20/19 1658  HGBA1C 7.8*   CBG: Recent Labs  Lab 03/20/19 2210 03/21/19 0748 03/21/19 1149  GLUCAP 225* 177* 109*   Urine analysis:    Component Value Date/Time   COLORURINE YELLOW 05/14/2013 1205   APPEARANCEUR CLEAR 05/14/2013 1205   LABSPEC >1.030 (H) 05/14/2013 1205   PHURINE 5.5 05/14/2013 1205   GLUCOSEU NEGATIVE 05/14/2013 1205   HGBUR NEGATIVE 05/14/2013 1205   [place] 05/14/2013 1205   [place] 05/14/2013 1205   PROTEINUR NEGATIVE 05/14/2013 1205   UROBILINOGEN 0.2 05/14/2013 1205   NITRITE NEGATIVE 05/14/2013 1205   LEUKOCYTESUR NEGATIVE 05/14/2013 1205    Recent Results (from the past 240 hour(s))  Novel Coronavirus, NAA ([facility])     Status: None   Collection Time: 03/17/19 10:29 AM   Specimen: Nasopharyngeal(NP) swabs in  vial transport medium   NASOPHARYNGE  TESTING  Result Value Ref Range Status   SARS-CoV-2, NAA Not Detected Not Detected Final    Comment: This nucleic acid amplification test was developed and its performance characteristics determined by [employer]. Nucleic acid amplification tests include RT-PCR and TMA. This test has not been FDA cleared or approved. This test has been authorized by FDA  under an Emergency Use Authorization (EUA). This test is only authorized for the duration of time the declaration that circumstances exist justifying the authorization of the emergency use of in vitro diagnostic tests for detection of SARS-CoV-2 virus and/or diagnosis of COVID-19 infection under section 564(b)(1) of the Act, 21 U.S.C. [id number]) (1), unless the authorization is terminated or revoked sooner. When diagnostic testing is negative, the possibility of a false negative result should be considered in the context of a patient's recent exposures and the presence of clinical signs and symptoms consistent with COVID-19. An individual without symptoms of COVID-19 and who is not shedding SARS-CoV-2 virus wo uld expect to have a negative (not detected) result in this assay.   Respiratory Panel by RT PCR (Flu A&B, Covid) - Nasopharyngeal Swab     Status: None   Collection Time: 03/20/19  5:00 PM   Specimen: Nasopharyngeal Swab  Result Value Ref Range Status   SARS Coronavirus 2 by RT PCR NEGATIVE NEGATIVE Final    Comment: (NOTE) SARS-CoV-2 target nucleic acids are NOT DETECTED. The SARS-CoV-2 RNA is generally detectable in upper respiratoy specimens during the acute phase of infection. The lowest concentration of SARS-CoV-2 viral copies this assay can detect is 131 copies/mL. A negative result does not preclude SARS-Cov-2 infection and should not be used as the sole basis for treatment or other patient management decisions. A negative result may occur with  improper specimen collection/handling, submission of specimen other than nasopharyngeal swab, presence of viral mutation(s) within the areas targeted by this assay, and inadequate number of viral copies (<131 copies/mL). A negative result must be combined with clinical observations, patient history, and epidemiological information. The expected result is Negative. Fact Sheet for Patients:   PinkCheek.be Fact Sheet for Healthcare Providers:  GravelBags.it This test is not yet ap proved or cleared by the [place] FDA and  has been authorized for detection and/or diagnosis of SARS-CoV-2 by FDA under an Emergency Use Authorization (EUA). This EUA will remain  in effect (meaning this test can be used) for the duration of the COVID-19 declaration under Section 564(b)(1) of the Act, 21 U.S.C. section 360bbb-3(b)(1), unless the authorization is terminated or revoked sooner.    Influenza A by PCR NEGATIVE NEGATIVE Final   Influenza B by PCR NEGATIVE NEGATIVE Final    Comment: (NOTE) The Xpert Xpress SARS-CoV-2/FLU/RSV assay is intended as an aid in  the diagnosis of influenza from Nasopharyngeal swab specimens and  should not be used as a sole basis for treatment. Nasal washings and  aspirates are unacceptable for Xpert Xpress SARS-CoV-2/FLU/RSV  testing. Fact Sheet for Patients: PinkCheek.be Fact Sheet for Healthcare Providers: GravelBags.it This test is not yet approved or cleared by the [place] FDA and  has been authorized for detection and/or diagnosis of SARS-CoV-2 by  FDA under an Emergency Use Authorization (EUA). This EUA will remain  in effect (meaning this test can be used) for the duration of the  Covid-19 declaration under Section 564(b)(1) of the Act, 21  U.S.C. section 360bbb-3(b)(1), unless the authorization is  terminated or revoked. Performed at [facility]  [facility], [address] [place], [place] [address]      Radiology Studies: DG Chest Portable 1 View  Result Date: 03/20/2019 CLINICAL DATA:  59 year old female with shortness of breath. EXAM: PORTABLE CHEST 1 VIEW COMPARISON:  None. FINDINGS: There is cardiomegaly with vascular congestion and mild edema. Small bilateral pleural effusions may be present. There is no pneumothorax. No acute  osseous pathology. IMPRESSION: Cardiomegaly with findings of CHF. Superimposed pneumonia is not excluded. Clinical correlation is recommended. Electronically Signed   By: Anner Crete M.D.   On: 03/20/2019 17:46   ECHOCARDIOGRAM COMPLETE  Result Date: 03/21/2019   ECHOCARDIOGRAM REPORT   Patient Name:   [name] Date of Exam: 03/21/2019 Medical Rec #:  [id number]    Height:       67.0 in Accession #:    [id number]   Weight:       198.9 lb Date of Birth:  1960-03-31    BSA:          2.02 m Patient Age:    22 years     BP:           124/65 mmHg Patient Gender: F            HR:           81 bpm. Exam Location:  [facility] Procedure: 2D Echo, Cardiac Doppler and Color Doppler Indications:    Acute CHF (congestive heart failure)  History:        Patient has no prior history of Echocardiogram examinations.                 CHF; Risk Factors:Current Smoker, Diabetes, Hypertension and                 Dyslipidemia.  Sonographer:    Alvino Chapel RCS Referring Phys: [place]  1. Left ventricular ejection fraction, by visual estimation, is 20 to 25%. The left ventricle has severely decreased function. There is mildly increased left ventricular hypertrophy.  2. Elevated left ventricular end-diastolic pressure.  3. Left ventricular diastolic parameters are consistent with Grade III diastolic dysfunction (restrictive).  4. The left ventricle demonstrates global hypokinesis.  5. Global right ventricle has mildly reduced systolic function.The right ventricular size is normal. No increase in right ventricular wall thickness.  6. Left atrial size was normal.  7. Right atrial size was normal.  8. The mitral valve is grossly normal. Moderate mitral valve regurgitation.  9. The tricuspid valve is grossly normal. 10. The tricuspid valve is grossly normal. Tricuspid valve regurgitation is trivial. 11. The aortic valve is tricuspid. Aortic valve regurgitation is not visualized. No evidence of aortic  valve sclerosis or stenosis. 12. Pulmonic regurgitation is mild. 13. The pulmonic valve was grossly normal. Pulmonic valve regurgitation is mild. 14. The inferior vena cava is normal in size with greater than 50% respiratory variability, suggesting right atrial pressure of 3 mmHg. FINDINGS  Left Ventricle: Left ventricular ejection fraction, by visual estimation, is 20 to 25%. The left ventricle has severely decreased function. The left ventricle demonstrates global hypokinesis. The left ventricular internal cavity size was the left ventricle is normal in size. There is mildly increased left ventricular hypertrophy. Concentric left ventricular hypertrophy. Left ventricular diastolic parameters are consistent with Grade III diastolic dysfunction (restrictive). Elevated left ventricular end-diastolic pressure. Right Ventricle: The right ventricular size is normal. No increase in right ventricular wall thickness. Global RV systolic function is has mildly reduced systolic function. Left Atrium:  Left atrial size was normal in size. Right Atrium: Right atrial size was normal in size Pericardium: There is no evidence of pericardial effusion. Mitral Valve: The mitral valve is grossly normal. Moderate mitral valve regurgitation. Tricuspid Valve: The tricuspid valve is grossly normal. Tricuspid valve regurgitation is trivial. Aortic Valve: The aortic valve is tricuspid. Aortic valve regurgitation is not visualized. The aortic valve is structurally normal, with no evidence of sclerosis or stenosis. Pulmonic Valve: The pulmonic valve was grossly normal. Pulmonic valve regurgitation is mild. Pulmonic regurgitation is mild. Aorta: The aortic root is normal in size and structure. Venous: The inferior vena cava is normal in size with greater than 50% respiratory variability, suggesting right atrial pressure of 3 mmHg. IAS/Shunts: No atrial level shunt detected by color flow Doppler.  LEFT VENTRICLE PLAX 2D LVIDd:         5.07 cm        Diastology LVIDs:         4.75 cm       LV e' lateral:   7.07 cm/s LV PW:         1.12 cm       LV E/e' lateral: 17.4 LV IVS:        1.11 cm       LV e' medial:    5.77 cm/s LVOT diam:     1.80 cm       LV E/e' medial:  21.3 LV SV:         17 ml LV SV Index:   8.22 LVOT Area:     2.54 cm  LV Volumes (MOD) LV area d, A2C:    34.70 cm LV area d, A4C:    36.10 cm LV area s, A2C:    29.60 cm LV area s, A4C:    30.10 cm LV major d, A2C:   7.80 cm LV major d, A4C:   8.44 cm LV major s, A2C:   7.83 cm LV major s, A4C:   8.18 cm LV vol d, MOD A2C: 130.0 ml LV vol d, MOD A4C: 124.0 ml LV vol s, MOD A2C: 96.5 ml LV vol s, MOD A4C: 91.5 ml LV SV MOD A2C:     33.5 ml LV SV MOD A4C:     124.0 ml LV SV MOD BP:      35.9 ml RIGHT VENTRICLE RV S prime:     12.80 cm/s TAPSE (M-mode): 1.9 cm LEFT ATRIUM           Index       RIGHT ATRIUM           Index LA diam:      4.30 cm 2.13 cm/m  RA Area:     11.80 cm LA Vol (A2C): 67.4 ml 33.41 ml/m RA Volume:   24.60 ml  12.19 ml/m LA Vol (A4C): 60.8 ml 30.14 ml/m  AORTIC VALVE LVOT Vmax:   83.50 cm/s LVOT Vmean:  49.800 cm/s LVOT VTI:    0.122 m  AORTA Ao Root diam: 3.00 cm MITRAL VALVE MV Area (PHT): 6.32 cm              SHUNTS MV PHT:        34.80 msec            Systemic VTI:  0.12 m MV Decel Time: 120 msec              Systemic Diam: 1.80 cm MR Peak grad:    92.5  mmHg MR Mean grad:    55.0 mmHg MR Vmax:         481.00 cm/s MR Vmean:        339.0 cm/s MR PISA:         1.01 cm MR PISA Eff ROA: 7 mm MR PISA Radius:  0.40 cm MV E velocity: 123.00 cm/s 103 cm/s MV A velocity: 56.30 cm/s  70.3 cm/s MV E/A ratio:  2.18        1.5  Kate Sable MD Electronically signed by Kate Sable MD Signature Date/Time: 03/21/2019/12:49:03 PM    Final     Scheduled Meds: . ALPRAZolam  1 mg Oral QHS  . atorvastatin  40 mg Oral q1800  . carvedilol  6.25 mg Oral BID WC  . enoxaparin (LOVENOX) injection  40 mg Subcutaneous Q24H  . FLUoxetine  20 mg Oral q morning - 10a  .  furosemide  40 mg Intravenous BID  . glipiZIDE  5 mg Oral BID AC  . insulin aspart  0-15 Units Subcutaneous TID WC  . insulin aspart  0-5 Units Subcutaneous QHS  . lisinopril  5 mg Oral q morning - 10a  . LORazepam  0.5 mg Oral TID  . Melatonin  3 mg Oral QHS  . nicotine  21 mg Transdermal Daily  . pantoprazole  40 mg Oral Daily  . potassium chloride  10 mEq Oral BID  . sodium chloride flush  3 mL Intravenous Q12H   Continuous Infusions: . sodium chloride       LOS: 1 day    Time spent: 30 minutes. Greater than 50% of this time was spent in direct contact with the patient, coordinating care and discussing relevant ongoing clinical issues, including shortness of breath, lower extremity swelling and new supplementation oxygen need.  Patient with a normal 2D echo results demonstrating acute systolic heart failure.  Denies chest pain and palpitations.  Reports some ongoing intermittent episode of diarrhea from IBS.   Barton Dubois, MD [facility] Pager [phone]   03/21/2019, 3:15 PM

## 2019-03-21 NOTE — Progress Notes (Signed)
*  PRELIMINARY RESULTS* Echocardiogram 2D Echocardiogram has been performed.  [name] 03/21/2019, 12:33 PM

## 2019-03-22 LAB — BASIC METABOLIC PANEL
Anion gap: 10 (ref 5–15)
BUN: 21 mg/dL — ABNORMAL HIGH (ref 6–20)
CO2: 33 mmol/L — ABNORMAL HIGH (ref 22–32)
Calcium: 9.4 mg/dL (ref 8.9–10.3)
Chloride: 97 mmol/L — ABNORMAL LOW (ref 98–111)
Creatinine, Ser: 0.96 mg/dL (ref 0.44–1.00)
GFR calc Af Amer: >60 mL/min (ref >60)
GFR calc non Af Amer: >60 mL/min (ref >60)
Glucose, Bld: 171 mg/dL — ABNORMAL HIGH (ref 70–99)
Potassium: 3.3 mmol/L — ABNORMAL LOW (ref 3.5–5.1)
Sodium: 140 mmol/L (ref 135–145)

## 2019-03-22 LAB — GLUCOSE, CAPILLARY
Glucose-Capillary: 152 mg/dL — ABNORMAL HIGH (ref 70–99)
Glucose-Capillary: 127 mg/dL — ABNORMAL HIGH (ref 70–99)
Glucose-Capillary: 139 mg/dL — ABNORMAL HIGH (ref 70–99)
Glucose-Capillary: 195 mg/dL — ABNORMAL HIGH (ref 70–99)

## 2019-03-22 MED ORDER — POTASSIUM CHLORIDE CRYS ER 20 MEQ PO TBCR
20.0000 meq | EXTENDED_RELEASE_TABLET | Freq: Two times a day (BID) | ORAL | Status: DC
  Administered 2019-03-22 – 2019-03-24 (×4): 20 meq via ORAL
  Filled 2019-03-22 (×4): qty 1

## 2019-03-22 NOTE — TOC Initial Note (Signed)
Transition of Care [facility]) - Initial/Assessment Note    Patient Details  Name: [name] MRN: [id number] Date of Birth: 02/07/1961  [facility] CM/SW Contact:    Zyria Fiscus, [name], RN Phone Number: 03/22/2019, 11:04 AM  Clinical Narrative: From home. Independent. New CHF diagnosis. Discussed some diet education and need to weigh daily with patient. Will request weighing scales to be given by bedside RN  if available.  Patient agreeable to dietician consult. Consult ordered.   If patient still needs ongoing education, she is agreeable to home health if needed.               Expected Discharge Plan: Home/Self Care Barriers to Discharge: Continued Medical Work up     Expected Discharge Plan and Services Expected Discharge Plan: Home/Self Care   Discharge Planning Services: CM Consult   Living arrangements for the past 2 months: Single Family Home                           Prior Living Arrangements/Services Living arrangements for the past 2 months: Single Family Home Lives with:: Self          Need for Family Participation in Patient Care: Yes (Comment) Care giver support system in place?: Yes (comment)      Activities of Daily Living Home Assistive Devices/Equipment: None ADL Screening (condition at time of admission) Patient's cognitive ability adequate to safely complete daily activities?: Yes Is the patient deaf or have difficulty hearing?: No Does the patient have difficulty seeing, even when wearing glasses/contacts?: No Does the patient have difficulty concentrating, remembering, or making decisions?: No Patient able to express need for assistance with ADLs?: Yes Does the patient have difficulty dressing or bathing?: No Independently performs ADLs?: Yes (appropriate for developmental age) Does the patient have difficulty walking or climbing stairs?: No Weakness of Legs: None Weakness of Arms/Hands: None   Emotional Assessment    Attitude/Demeanor/Rapport: Engaged Affect (typically observed): Appropriate, Accepting Orientation: : Oriented to Self, Oriented to Place, Oriented to  Time, Oriented to Situation Alcohol / Substance Use: Not Applicable Psych Involvement: No (comment)  Admission diagnosis:  Acute CHF (congestive heart failure) ([place]) 0000000 Systolic congestive heart failure, unspecified HF chronicity ([place]) [I50.20] Patient Active Problem List   Diagnosis Date Noted  . Acute CHF (congestive heart failure) ([place]) 03/20/2019  . Hypokalemia 04/30/2018  . Hypomagnesemia 04/30/2018  . Cellulitis of left buttock 04/30/2018  . Cellulitis of right buttock 04/30/2018  . Unspecified open wound of left buttock, initial encounter   . Left buttock abscess 04/29/2018  . History of colonic polyps 06/14/2016  . Diverticulitis of colon (without mention of hemorrhage)(562.11) 05/18/2013  . Acute renal failure ([place]) 05/14/2013  . Hyperkalemia 05/14/2013  . Hypotension 05/14/2013  . Tobacco abuse 05/14/2013  . DM (diabetes mellitus) ([place]) 11/05/2011  . HTN (hypertension) 11/05/2011  . Diarrhea 11/05/2011  . Anal fissure 11/05/2011   PCP:  [name], MD Pharmacy:   [facility], [place] [address] Phone: [phone] Fax: [phone]     Social Determinants of Health (SDOH) Interventions    Readmission Risk Interventions No flowsheet data found.

## 2019-03-22 NOTE — Progress Notes (Signed)
Notified by telemetry at 2331, that patient had six beat run of PVC's at 2156. Patient has been resting in bed, denies any pain or SOB. Vital signs obtained. Midlevel notified.

## 2019-03-22 NOTE — Progress Notes (Signed)
PROGRESS NOTE    [name]  [id number] DOB: 03-13-1960 DOA: 03/20/2019 PCP: [name], MD     Brief Narrative:  As per H&P written by Dr. Waldron Labs 03/20/2019 59 y.o. female, with medical history significant forDM, hypertension, anal fissure, seizures, hypertension, depression, presented to the ED with complaints of shortness of breath, reports it has been going on for a week, reports she has called her PCP, where she was prescribed azithromycin and prednisone, reports she was supposed to start them today, reports she tested negative for COVID-19 on Tuesday, report dyspnea progressive over 1 week, mainly exertional, she is unaware of any orthopnea, as she unable to sleep flat at baseline given chronic neck pain issues, reports lower extremity edema as well, orts occasional cough, denies fever, chills, nausea, vomiting, diarrhea, or any COVID-19 contacts, reports she is still smoking, does not follow a low-salt diet. - in ED a proBNP was elevated, chest x-ray significant for cardiomegaly with volume overload, she was saturating 88% on room air, requiring 2 L nasal cannula, she reports some improvement after receiving IV Lasix, I was consulted to admit for new onset CHF.  Assessment & Plan: 1-lower extremity edema, swelling and acute respiratory failure with hypoxia -In the setting of acute systolic heart failure -No chest pain no palpitations -Telemetry without incidents. -Continue IV diuresis -Close monitoring of renal function and electrolytes -Low-sodium diet, daily weights and strict I's and O's to be continued while inpatient.  Long discussion with patient about this. -Patient has been started on carvedilol and will continue the use of ACE inhibitor. -Cardiology service has been consulted to further assist with evaluation and management of acute systolic heart failure.  2-DM (diabetes mellitus) (HCC) -A1c 7.8 -Continue sliding scale insulin and glipizide  3-HTN  (hypertension) -Stable and well-controlled -Continue current antihypertensive regimen and follow vital signs.  4-Tobacco abuse -Cessation counseling has been provided. -Continue nicotine patch.  5-IBS/GERD -No abdominal pain -continue Bentyl -Continue PPI.  6-anxiety/depression -Continue as needed Xanax and Ativan. -Continue Prozac -No suicidal regional hallucinations.  7-hyperlipidemia -Continue Lipitor.  8-hypokalemia -In the setting of diuresis -Continue daily supplementation and follow electrolytes.   DVT prophylaxis: Lovenox Code Status: Full code Family Communication: No family at bedside. Disposition Plan: Remains inpatient, continue IV diuresis, cardiology service has been consulted to further assist with further evaluation and management of acute systolic heart failure.start Low-dose carvedilol, continue lisinopril, follow renal function and electrolytes closely while diuresing and further replete as needed.  Consultants:   Cardiology has been consulted.  Procedures:   2D echo:    1. Left ventricular ejection fraction, by visual estimation, is 20 to 25%. The left ventricle has severely decreased function. There is mildly increased left ventricular hypertrophy.  2. Elevated left ventricular end-diastolic pressure.  3. Left ventricular diastolic parameters are consistent with Grade III diastolic dysfunction (restrictive).  4. The left ventricle demonstrates global hypokinesis.  5. Global right ventricle has mildly reduced systolic function.The right ventricular size is normal. No increase in right ventricular wall thickness.  6. Left atrial size was normal.  7. Right atrial size was normal.  8. The mitral valve is grossly normal. Moderate mitral valve regurgitation.  9. The tricuspid valve is grossly normal. 10. The tricuspid valve is grossly normal. Tricuspid valve regurgitation is trivial. 11. The aortic valve is tricuspid. Aortic valve regurgitation is not  visualized. No evidence of aortic valve sclerosis or stenosis. 12. Pulmonic regurgitation is mild. 13. The pulmonic valve was grossly normal. Pulmonic valve regurgitation  is mild. 14. The inferior vena cava is normal in size with greater than 50% respiratory variability, suggesting right atrial pressure of 3 mmHg.  Antimicrobials:  Anti-infectives (From admission, onward)   None       Subjective: Patient reports improvement in her breathing.  No requiring oxygen supplementation at rest; still short of breath on exertion.  Overall with significant improvement in fluid overload status.  Objective: Vitals:   03/21/19 2136 03/21/19 2156 03/22/19 0451 03/22/19 0736  BP: 122/75  114/64   Pulse: 76 73 71   Resp:  16    Temp: 98.7 F (37.1 C)  98.2 F (36.8 C)   TempSrc: Oral  Oral   SpO2: 95% 93% 97% 94%  Weight:   88.6 kg   Height:        Intake/Output Summary (Last 24 hours) at 03/22/2019 1320 Last data filed at 03/22/2019 1159 Gross per 24 hour  Intake 480 ml  Output 3450 ml  Net -2970 ml   Filed Weights   03/20/19 2206 03/21/19 0500 03/22/19 0451  Weight: 90.4 kg 90.2 kg 88.6 kg    Examination: General exam: Alert, awake, oriented x 3, no chest pain, no nausea, no vomiting, no fever.  Patient reports feeling short of breath on exertion, but otherwise significantly improved.  At rest no requiring oxygen supplementation.  Lower extremity edema essentially resolved. Respiratory system: Improved air movement bilaterally, no wheezing, no using accessory muscle.  No frank crackles on exam. Cardiovascular system:RRR. No murmurs, rubs, gallops.  No JVD. Gastrointestinal system: Abdomen is nondistended, soft and nontender. No organomegaly or masses felt. Normal bowel sounds heard. Central nervous system: Alert and oriented. No focal neurological deficits. Extremities: No cyanosis or clubbing; trace edema appreciated bilaterally. Skin: No rashes, no open wounds. Psychiatry:  Judgement and insight appear normal. Mood & affect appropriate.    Data Reviewed: I have personally reviewed following labs and imaging studies  CBC: Recent Labs  Lab 03/20/19 1658  WBC 8.5  NEUTROABS 6.6  HGB 12.5  HCT 39.8  MCV 83.4  PLT 99991111   Basic Metabolic Panel: Recent Labs  Lab 03/20/19 1658 03/21/19 0616 03/22/19 0443  NA 140 140 140  K 3.1* 3.3* 3.3*  CL 101 99 97*  CO2 29 31 33*  GLUCOSE 203* 162* 171*  BUN 17 15 21*  CREATININE 0.91 0.93 0.96  CALCIUM 8.9 9.0 9.4   GFR: Estimated Creatinine Clearance: 73.9 mL/min (by C-G formula based on SCr of 0.96 mg/dL).   Liver Function Tests: Recent Labs  Lab 03/20/19 1658  AST 17  ALT 23  ALKPHOS 104  BILITOT 0.7  PROT 6.7  ALBUMIN 3.5   HbA1C: Recent Labs    03/20/19 1658  HGBA1C 7.8*   CBG: Recent Labs  Lab 03/21/19 1149 03/21/19 1634 03/21/19 2137 03/22/19 0720 03/22/19 1118  GLUCAP 109* 204* 180* 152* 127*   Urine analysis:    Component Value Date/Time   COLORURINE YELLOW 05/14/2013 1205   APPEARANCEUR CLEAR 05/14/2013 1205   LABSPEC >1.030 (H) 05/14/2013 1205   PHURINE 5.5 05/14/2013 1205   GLUCOSEU NEGATIVE 05/14/2013 1205   HGBUR NEGATIVE 05/14/2013 1205   [place] 05/14/2013 1205   KETONESUR NEGATIVE 05/14/2013 1205   PROTEINUR NEGATIVE 05/14/2013 1205   UROBILINOGEN 0.2 05/14/2013 1205   NITRITE NEGATIVE 05/14/2013 1205   LEUKOCYTESUR NEGATIVE 05/14/2013 1205    Recent Results (from the past 240 hour(s))  Novel Coronavirus, NAA ([facility])     Status: None  Collection Time: 03/17/19 10:29 AM   Specimen: Nasopharyngeal(NP) swabs in vial transport medium   NASOPHARYNGE  TESTING  Result Value Ref Range Status   SARS-CoV-2, NAA Not Detected Not Detected Final    Comment: This nucleic acid amplification test was developed and its performance characteristics determined by [employer]. Nucleic acid amplification tests include RT-PCR and TMA. This test has not  been FDA cleared or approved. This test has been authorized by FDA under an Emergency Use Authorization (EUA). This test is only authorized for the duration of time the declaration that circumstances exist justifying the authorization of the emergency use of in vitro diagnostic tests for detection of SARS-CoV-2 virus and/or diagnosis of COVID-19 infection under section 564(b)(1) of the Act, 21 U.S.C. [id number]) (1), unless the authorization is terminated or revoked sooner. When diagnostic testing is negative, the possibility of a false negative result should be considered in the context of a patient's recent exposures and the presence of clinical signs and symptoms consistent with COVID-19. An individual without symptoms of COVID-19 and who is not shedding SARS-CoV-2 virus wo uld expect to have a negative (not detected) result in this assay.   Respiratory Panel by RT PCR (Flu A&B, Covid) - Nasopharyngeal Swab     Status: None   Collection Time: 03/20/19  5:00 PM   Specimen: Nasopharyngeal Swab  Result Value Ref Range Status   SARS Coronavirus 2 by RT PCR NEGATIVE NEGATIVE Final    Comment: (NOTE) SARS-CoV-2 target nucleic acids are NOT DETECTED. The SARS-CoV-2 RNA is generally detectable in upper respiratoy specimens during the acute phase of infection. The lowest concentration of SARS-CoV-2 viral copies this assay can detect is 131 copies/mL. A negative result does not preclude SARS-Cov-2 infection and should not be used as the sole basis for treatment or other patient management decisions. A negative result may occur with  improper specimen collection/handling, submission of specimen other than nasopharyngeal swab, presence of viral mutation(s) within the areas targeted by this assay, and inadequate number of viral copies (<131 copies/mL). A negative result must be combined with clinical observations, patient history, and epidemiological information. The expected result is  Negative. Fact Sheet for Patients:  PinkCheek.be Fact Sheet for Healthcare Providers:  GravelBags.it This test is not yet ap proved or cleared by the [place] FDA and  has been authorized for detection and/or diagnosis of SARS-CoV-2 by FDA under an Emergency Use Authorization (EUA). This EUA will remain  in effect (meaning this test can be used) for the duration of the COVID-19 declaration under Section 564(b)(1) of the Act, 21 U.S.C. section 360bbb-3(b)(1), unless the authorization is terminated or revoked sooner.    Influenza A by PCR NEGATIVE NEGATIVE Final   Influenza B by PCR NEGATIVE NEGATIVE Final    Comment: (NOTE) The Xpert Xpress SARS-CoV-2/FLU/RSV assay is intended as an aid in  the diagnosis of influenza from Nasopharyngeal swab specimens and  should not be used as a sole basis for treatment. Nasal washings and  aspirates are unacceptable for Xpert Xpress SARS-CoV-2/FLU/RSV  testing. Fact Sheet for Patients: PinkCheek.be Fact Sheet for Healthcare Providers: GravelBags.it This test is not yet approved or cleared by the [place] FDA and  has been authorized for detection and/or diagnosis of SARS-CoV-2 by  FDA under an Emergency Use Authorization (EUA). This EUA will remain  in effect (meaning this test can be used) for the duration of the  Covid-19 declaration under Section 564(b)(1) of the Act, 21  U.S.C. section 360bbb-3(b)(1), unless  the authorization is  terminated or revoked. Performed at [facility], [address], [place], [place] [address]      Radiology Studies: DG Chest Portable 1 View  Result Date: 03/20/2019 CLINICAL DATA:  59 year old female with shortness of breath. EXAM: PORTABLE CHEST 1 VIEW COMPARISON:  None. FINDINGS: There is cardiomegaly with vascular congestion and mild edema. Small bilateral pleural effusions may be  present. There is no pneumothorax. No acute osseous pathology. IMPRESSION: Cardiomegaly with findings of CHF. Superimposed pneumonia is not excluded. Clinical correlation is recommended. Electronically Signed   By: Anner Crete M.D.   On: 03/20/2019 17:46   ECHOCARDIOGRAM COMPLETE  Result Date: 03/21/2019   ECHOCARDIOGRAM REPORT   Patient Name:   KELAHNI VATTER Date of Exam: 03/21/2019 Medical Rec #:  [id number]    Height:       67.0 in Accession #:    [id number]   Weight:       198.9 lb Date of Birth:  June 13, 1960    BSA:          2.02 m Patient Age:    33 years     BP:           124/65 mmHg Patient Gender: F            HR:           81 bpm. Exam Location:  [facility] Procedure: 2D Echo, Cardiac Doppler and Color Doppler Indications:    Acute CHF (congestive heart failure)  History:        Patient has no prior history of Echocardiogram examinations.                 CHF; Risk Factors:Current Smoker, Diabetes, Hypertension and                 Dyslipidemia.  Sonographer:    Alvino Chapel RCS Referring Phys: [place]  1. Left ventricular ejection fraction, by visual estimation, is 20 to 25%. The left ventricle has severely decreased function. There is mildly increased left ventricular hypertrophy.  2. Elevated left ventricular end-diastolic pressure.  3. Left ventricular diastolic parameters are consistent with Grade III diastolic dysfunction (restrictive).  4. The left ventricle demonstrates global hypokinesis.  5. Global right ventricle has mildly reduced systolic function.The right ventricular size is normal. No increase in right ventricular wall thickness.  6. Left atrial size was normal.  7. Right atrial size was normal.  8. The mitral valve is grossly normal. Moderate mitral valve regurgitation.  9. The tricuspid valve is grossly normal. 10. The tricuspid valve is grossly normal. Tricuspid valve regurgitation is trivial. 11. The aortic valve is tricuspid. Aortic valve regurgitation  is not visualized. No evidence of aortic valve sclerosis or stenosis. 12. Pulmonic regurgitation is mild. 13. The pulmonic valve was grossly normal. Pulmonic valve regurgitation is mild. 14. The inferior vena cava is normal in size with greater than 50% respiratory variability, suggesting right atrial pressure of 3 mmHg. FINDINGS  Left Ventricle: Left ventricular ejection fraction, by visual estimation, is 20 to 25%. The left ventricle has severely decreased function. The left ventricle demonstrates global hypokinesis. The left ventricular internal cavity size was the left ventricle is normal in size. There is mildly increased left ventricular hypertrophy. Concentric left ventricular hypertrophy. Left ventricular diastolic parameters are consistent with Grade III diastolic dysfunction (restrictive). Elevated left ventricular end-diastolic pressure. Right Ventricle: The right ventricular size is normal. No increase in right ventricular wall thickness. Global  RV systolic function is has mildly reduced systolic function. Left Atrium: Left atrial size was normal in size. Right Atrium: Right atrial size was normal in size Pericardium: There is no evidence of pericardial effusion. Mitral Valve: The mitral valve is grossly normal. Moderate mitral valve regurgitation. Tricuspid Valve: The tricuspid valve is grossly normal. Tricuspid valve regurgitation is trivial. Aortic Valve: The aortic valve is tricuspid. Aortic valve regurgitation is not visualized. The aortic valve is structurally normal, with no evidence of sclerosis or stenosis. Pulmonic Valve: The pulmonic valve was grossly normal. Pulmonic valve regurgitation is mild. Pulmonic regurgitation is mild. Aorta: The aortic root is normal in size and structure. Venous: The inferior vena cava is normal in size with greater than 50% respiratory variability, suggesting right atrial pressure of 3 mmHg. IAS/Shunts: No atrial level shunt detected by color flow Doppler.  LEFT  VENTRICLE PLAX 2D LVIDd:         5.07 cm       Diastology LVIDs:         4.75 cm       LV e' lateral:   7.07 cm/s LV PW:         1.12 cm       LV E/e' lateral: 17.4 LV IVS:        1.11 cm       LV e' medial:    5.77 cm/s LVOT diam:     1.80 cm       LV E/e' medial:  21.3 LV SV:         17 ml LV SV Index:   8.22 LVOT Area:     2.54 cm  LV Volumes (MOD) LV area d, A2C:    34.70 cm LV area d, A4C:    36.10 cm LV area s, A2C:    29.60 cm LV area s, A4C:    30.10 cm LV major d, A2C:   7.80 cm LV major d, A4C:   8.44 cm LV major s, A2C:   7.83 cm LV major s, A4C:   8.18 cm LV vol d, MOD A2C: 130.0 ml LV vol d, MOD A4C: 124.0 ml LV vol s, MOD A2C: 96.5 ml LV vol s, MOD A4C: 91.5 ml LV SV MOD A2C:     33.5 ml LV SV MOD A4C:     124.0 ml LV SV MOD BP:      35.9 ml RIGHT VENTRICLE RV S prime:     12.80 cm/s TAPSE (M-mode): 1.9 cm LEFT ATRIUM           Index       RIGHT ATRIUM           Index LA diam:      4.30 cm 2.13 cm/m  RA Area:     11.80 cm LA Vol (A2C): 67.4 ml 33.41 ml/m RA Volume:   24.60 ml  12.19 ml/m LA Vol (A4C): 60.8 ml 30.14 ml/m  AORTIC VALVE LVOT Vmax:   83.50 cm/s LVOT Vmean:  49.800 cm/s LVOT VTI:    0.122 m  AORTA Ao Root diam: 3.00 cm MITRAL VALVE MV Area (PHT): 6.32 cm              SHUNTS MV PHT:        34.80 msec            Systemic VTI:  0.12 m MV Decel Time: 120 msec  Systemic Diam: 1.80 cm MR Peak grad:    92.5 mmHg MR Mean grad:    55.0 mmHg MR Vmax:         481.00 cm/s MR Vmean:        339.0 cm/s MR PISA:         1.01 cm MR PISA Eff ROA: 7 mm MR PISA Radius:  0.40 cm MV E velocity: 123.00 cm/s 103 cm/s MV A velocity: 56.30 cm/s  70.3 cm/s MV E/A ratio:  2.18        1.5  Kate Sable MD Electronically signed by Kate Sable MD Signature Date/Time: 03/21/2019/12:49:03 PM    Final     Scheduled Meds: . ALPRAZolam  1 mg Oral QHS  . atorvastatin  40 mg Oral q1800  . carvedilol  6.25 mg Oral BID WC  . enoxaparin (LOVENOX) injection  40 mg Subcutaneous Q24H  .  FLUoxetine  20 mg Oral q morning - 10a  . furosemide  40 mg Intravenous BID  . glipiZIDE  5 mg Oral BID AC  . insulin aspart  0-15 Units Subcutaneous TID WC  . insulin aspart  0-5 Units Subcutaneous QHS  . lisinopril  5 mg Oral q morning - 10a  . LORazepam  0.5 mg Oral TID  . Melatonin  3 mg Oral QHS  . nicotine  21 mg Transdermal Daily  . pantoprazole  40 mg Oral Daily  . potassium chloride  10 mEq Oral BID  . sodium chloride flush  3 mL Intravenous Q12H   Continuous Infusions: . sodium chloride       LOS: 2 days    Time spent: 30 minutes.    Barton Dubois, MD [facility] Pager [phone]   03/22/2019, 1:20 PM

## 2019-03-23 ENCOUNTER — Inpatient Hospital Stay (HOSPITAL_COMMUNITY)
Admission: EM | Disposition: A | Discharge status: Home / Self Care | Payer: Self-pay | Source: Home / Self Care | Attending: Internal Medicine

## 2019-03-23 ENCOUNTER — Encounter (HOSPITAL_COMMUNITY): Payer: Self-pay | Admitting: Internal Medicine

## 2019-03-23 DIAGNOSIS — E876 Hypokalemia: Secondary | ICD-10-CM

## 2019-03-23 DIAGNOSIS — I10 Essential (primary) hypertension: Secondary | ICD-10-CM

## 2019-03-23 DIAGNOSIS — E1165 Type 2 diabetes mellitus with hyperglycemia: Secondary | ICD-10-CM

## 2019-03-23 DIAGNOSIS — I5021 Acute systolic (congestive) heart failure: Secondary | ICD-10-CM

## 2019-03-23 DIAGNOSIS — E785 Hyperlipidemia, unspecified: Secondary | ICD-10-CM

## 2019-03-23 DIAGNOSIS — E119 Type 2 diabetes mellitus without complications: Secondary | ICD-10-CM

## 2019-03-23 DIAGNOSIS — I428 Other cardiomyopathies: Secondary | ICD-10-CM

## 2019-03-23 DIAGNOSIS — I5041 Acute combined systolic (congestive) and diastolic (congestive) heart failure: Secondary | ICD-10-CM

## 2019-03-23 DIAGNOSIS — Z720 Tobacco use: Secondary | ICD-10-CM

## 2019-03-23 HISTORY — PX: RIGHT/LEFT HEART CATH AND CORONARY ANGIOGRAPHY: CATH118266

## 2019-03-23 LAB — CBC
HCT: 43.3 % (ref 36.0–46.0)
Hemoglobin: 13.7 g/dL (ref 12.0–15.0)
MCH: 25.7 pg — ABNORMAL LOW (ref 26.0–34.0)
MCHC: 31.6 g/dL (ref 30.0–36.0)
MCV: 81.2 fL (ref 80.0–100.0)
Platelets: 312 10*3/uL (ref 150–400)
RBC: 5.33 MIL/uL — ABNORMAL HIGH (ref 3.87–5.11)
RDW: 14.5 % (ref 11.5–15.5)
WBC: 8.1 10*3/uL (ref 4.0–10.5)
nRBC: 0 % (ref 0.0–0.2)

## 2019-03-23 LAB — POCT I-STAT EG7
Acid-Base Excess: 7 mmol/L — ABNORMAL HIGH (ref 0.0–2.0)
Acid-Base Excess: 7 mmol/L — ABNORMAL HIGH (ref 0.0–2.0)
Bicarbonate: 32.9 mmol/L — ABNORMAL HIGH (ref 20.0–28.0)
Bicarbonate: 33.2 mmol/L — ABNORMAL HIGH (ref 20.0–28.0)
Calcium, Ion: 1.19 mmol/L (ref 1.15–1.40)
Calcium, Ion: 1.2 mmol/L (ref 1.15–1.40)
HCT: 40 % (ref 36.0–46.0)
HCT: 40 % (ref 36.0–46.0)
Hemoglobin: 13.6 g/dL (ref 12.0–15.0)
Hemoglobin: 13.6 g/dL (ref 12.0–15.0)
O2 Saturation: 62 %
O2 Saturation: 65 %
Potassium: 4.2 mmol/L (ref 3.5–5.1)
Potassium: 4.3 mmol/L (ref 3.5–5.1)
Sodium: 139 mmol/L (ref 135–145)
Sodium: 140 mmol/L (ref 135–145)
TCO2: 34 mmol/L — ABNORMAL HIGH (ref 22–32)
TCO2: 35 mmol/L — ABNORMAL HIGH (ref 22–32)
pCO2, Ven: 51.7 mmHg (ref 44.0–60.0)
pCO2, Ven: 51.9 mmHg (ref 44.0–60.0)
pH, Ven: 7.412 (ref 7.250–7.430)
pH, Ven: 7.414 (ref 7.250–7.430)
pO2, Ven: 33 mmHg (ref 32.0–45.0)
pO2, Ven: 34 mmHg (ref 32.0–45.0)

## 2019-03-23 LAB — BASIC METABOLIC PANEL
Anion gap: 13 (ref 5–15)
BUN: 31 mg/dL — ABNORMAL HIGH (ref 6–20)
CO2: 32 mmol/L (ref 22–32)
Calcium: 9.5 mg/dL (ref 8.9–10.3)
Chloride: 93 mmol/L — ABNORMAL LOW (ref 98–111)
Creatinine, Ser: 1.25 mg/dL — ABNORMAL HIGH (ref 0.44–1.00)
GFR calc Af Amer: 55 mL/min — ABNORMAL LOW (ref >60)
GFR calc non Af Amer: 48 mL/min — ABNORMAL LOW (ref >60)
Glucose, Bld: 158 mg/dL — ABNORMAL HIGH (ref 70–99)
Potassium: 3.3 mmol/L — ABNORMAL LOW (ref 3.5–5.1)
Sodium: 138 mmol/L (ref 135–145)

## 2019-03-23 LAB — GLUCOSE, CAPILLARY
Glucose-Capillary: 134 mg/dL — ABNORMAL HIGH (ref 70–99)
Glucose-Capillary: 138 mg/dL — ABNORMAL HIGH (ref 70–99)
Glucose-Capillary: 117 mg/dL — ABNORMAL HIGH (ref 70–99)
Glucose-Capillary: 128 mg/dL — ABNORMAL HIGH (ref 70–99)
Glucose-Capillary: 200 mg/dL — ABNORMAL HIGH (ref 70–99)

## 2019-03-23 LAB — CREATININE, SERUM
Creatinine, Ser: 1.28 mg/dL — ABNORMAL HIGH (ref 0.44–1.00)
GFR calc Af Amer: 54 mL/min — ABNORMAL LOW (ref >60)
GFR calc non Af Amer: 46 mL/min — ABNORMAL LOW (ref >60)

## 2019-03-23 SURGERY — RIGHT/LEFT HEART CATH AND CORONARY ANGIOGRAPHY
Anesthesia: LOCAL

## 2019-03-23 MED ORDER — LABETALOL HCL 5 MG/ML IV SOLN: 10.0000 mg | INTRAVENOUS | Status: AC | PRN

## 2019-03-23 MED ORDER — ASPIRIN 81 MG PO CHEW: 81.0000 mg | CHEWABLE_TABLET | ORAL | Status: DC

## 2019-03-23 MED ORDER — MIDAZOLAM HCL 2 MG/2ML IJ SOLN
INTRAMUSCULAR | Status: AC
  Filled 2019-03-23: qty 2

## 2019-03-23 MED ORDER — LIDOCAINE HCL (PF) 1 % IJ SOLN
INTRAMUSCULAR | Status: DC | PRN
  Administered 2019-03-23 (×2): 2 mL via INTRADERMAL

## 2019-03-23 MED ORDER — IOHEXOL 350 MG/ML SOLN
INTRAVENOUS | Status: DC | PRN
  Administered 2019-03-23: 35 mL via INTRA_ARTERIAL

## 2019-03-23 MED ORDER — SODIUM CHLORIDE 0.9 % IV SOLN: INTRAVENOUS | Status: DC

## 2019-03-23 MED ORDER — HEPARIN SODIUM (PORCINE) 1000 UNIT/ML IJ SOLN
INTRAMUSCULAR | Status: DC | PRN
  Administered 2019-03-23: 4500 [IU] via INTRAVENOUS

## 2019-03-23 MED ORDER — POTASSIUM CHLORIDE CRYS ER 20 MEQ PO TBCR
40.0000 meq | EXTENDED_RELEASE_TABLET | Freq: Once | ORAL | Status: AC
  Administered 2019-03-23: 40 meq via ORAL
  Filled 2019-03-23: qty 2

## 2019-03-23 MED ORDER — HEPARIN (PORCINE) IN NACL 1000-0.9 UT/500ML-% IV SOLN
INTRAVENOUS | Status: AC
  Filled 2019-03-23: qty 1000

## 2019-03-23 MED ORDER — SODIUM CHLORIDE 0.9% FLUSH: 3.0000 mL | INTRAVENOUS | Status: DC | PRN

## 2019-03-23 MED ORDER — SODIUM CHLORIDE 0.9 % IV SOLN: 250.0000 mL | INTRAVENOUS | Status: DC | PRN

## 2019-03-23 MED ORDER — HYDRALAZINE HCL 20 MG/ML IJ SOLN: 10.0000 mg | INTRAMUSCULAR | Status: AC | PRN

## 2019-03-23 MED ORDER — MIDAZOLAM HCL 2 MG/2ML IJ SOLN
INTRAMUSCULAR | Status: DC | PRN
  Administered 2019-03-23: 1 mg via INTRAVENOUS

## 2019-03-23 MED ORDER — FUROSEMIDE 40 MG PO TABS
40.0000 mg | ORAL_TABLET | Freq: Every day | ORAL | Status: DC
  Administered 2019-03-23: 40 mg via ORAL
  Filled 2019-03-23: qty 1

## 2019-03-23 MED ORDER — ONDANSETRON HCL 4 MG/2ML IJ SOLN
4.0000 mg | Freq: Four times a day (QID) | INTRAMUSCULAR | Status: DC | PRN
  Filled 2019-03-23: qty 2

## 2019-03-23 MED ORDER — VERAPAMIL HCL 2.5 MG/ML IV SOLN
INTRAVENOUS | Status: AC
  Filled 2019-03-23: qty 2

## 2019-03-23 MED ORDER — VERAPAMIL HCL 2.5 MG/ML IV SOLN
INTRAVENOUS | Status: DC | PRN
  Administered 2019-03-23: 10 mL via INTRA_ARTERIAL

## 2019-03-23 MED ORDER — ACETAMINOPHEN 325 MG PO TABS
650.0000 mg | ORAL_TABLET | ORAL | Status: DC | PRN
  Administered 2019-03-23 – 2019-03-24 (×2): 650 mg via ORAL
  Filled 2019-03-23 (×2): qty 2

## 2019-03-23 MED ORDER — ENOXAPARIN SODIUM 40 MG/0.4ML ~~LOC~~ SOLN
40.0000 mg | SUBCUTANEOUS | Status: DC
  Administered 2019-03-24: 40 mg via SUBCUTANEOUS
  Filled 2019-03-23: qty 0.4

## 2019-03-23 MED ORDER — LIDOCAINE HCL (PF) 1 % IJ SOLN
INTRAMUSCULAR | Status: AC
  Filled 2019-03-23: qty 30

## 2019-03-23 MED ORDER — LOSARTAN POTASSIUM 25 MG PO TABS
12.5000 mg | ORAL_TABLET | Freq: Every day | ORAL | Status: DC
  Administered 2019-03-23 – 2019-03-24 (×2): 12.5 mg via ORAL
  Filled 2019-03-23 (×2): qty 1

## 2019-03-23 MED ORDER — HEPARIN SODIUM (PORCINE) 1000 UNIT/ML IJ SOLN
INTRAMUSCULAR | Status: AC
  Filled 2019-03-23: qty 1

## 2019-03-23 MED ORDER — SODIUM CHLORIDE 0.9% FLUSH
3.0000 mL | Freq: Two times a day (BID) | INTRAVENOUS | Status: DC
  Administered 2019-03-24: 3 mL via INTRAVENOUS

## 2019-03-23 MED ORDER — FENTANYL CITRATE (PF) 100 MCG/2ML IJ SOLN
INTRAMUSCULAR | Status: AC
  Filled 2019-03-23: qty 2

## 2019-03-23 MED ORDER — FENTANYL CITRATE (PF) 100 MCG/2ML IJ SOLN
INTRAMUSCULAR | Status: DC | PRN
  Administered 2019-03-23: 25 ug via INTRAVENOUS

## 2019-03-23 SURGICAL SUPPLY — 11 items

## 2019-03-23 NOTE — Progress Notes (Signed)
PROGRESS NOTE    [name]  [id number] DOB: [birth year] DOA: 03/20/2019 PCP: [name], MD     Brief Narrative:  As per H&P written by Dr. Waldron Labs 03/20/2019 59 y.o. female, with medical history significant forDM, hypertension, anal fissure, seizures, hypertension, depression, presented to the ED with complaints of shortness of breath, reports it has been going on for a week, reports she has called her PCP, where she was prescribed azithromycin and prednisone, reports she was supposed to start them today, reports she tested negative for COVID-19 on Tuesday, report dyspnea progressive over 1 week, mainly exertional, she is unaware of any orthopnea, as she unable to sleep flat at baseline given chronic neck pain issues, reports lower extremity edema as well, orts occasional cough, denies fever, chills, nausea, vomiting, diarrhea, or any COVID-19 contacts, reports she is still smoking, does not follow a low-salt diet. -in ED a proBNP was elevated, chest x-ray significant for cardiomegaly with volume overload, she was saturating 88% on room air, requiring 2 L nasal cannula, she reports some improvement after receiving IV Lasix, I was consulted to admit for new onset CHF.  Assessment & Plan: 1-lower extremity edema, swelling and acute respiratory failure with hypoxia -In the setting of acute systolic heart failure. -No chest pain and no palpitations currently. -Telemetry without incidents. -Creatinine went up and at this moment diuresis will be held. -Continue closely monitoring of renal function and electrolytes -Continue low-sodium diet, daily weights and strict I's and O's to be continued while inpatient.  Long discussion with patient about this. -Patient has been started on carvedilol and will continue the use of ACE inhibitor. -Cardiology service has been consulted to further assist with evaluation and management of acute systolic heart failure.  Patient will be transferred to  [facility] for right and left heart cath and evaluation by the heart failure team.  [facility] cardiology assistance and further care. -2D echo demonstrated ejection fraction of 20-25%.  2-DM (diabetes mellitus) (HCC) -A1c 7.8 -Continue sliding scale insulin and glipizide. -Continue to follow CBGs and adjust hypoglycemic regimen as needed.  3-HTN (hypertension) -Overall stable, slightly soft after initiation of beta-blocker and further diuresis. -Lasix has been now transition to oral regimen and starting on the 26 -Lisinopril will be held today in the setting of a slight increase in patient's creatinine level. -Follow vital signs and further adjust antihypertensive regimen as needed.  4-Tobacco abuse -Cessation counseling has been provided. -Continue nicotine patch.  5-IBS/GERD -No abdominal pain -continue Bentyl -Continue PPI.  6-anxiety/depression -Continue as needed Xanax and Ativan. -Continue Prozac -No suicidal regional hallucinations.  7-hyperlipidemia -Continue Lipitor.  8-hypokalemia -In the setting of diuresis -Continue daily supplementation and follow electrolytes. -Potassium 3.3.  9-mild acute renal insufficiency in the setting of diuresis -Holding lisinopril for the rest of the day -Continue to minimize any nephrotoxic agents -Diuretics has been also held and will be transition to oral doses started on the 26. -Repeat basic metabolic panel in the morning to further assist creatinine trend and the stability. -No underlying history of renal failure.  DVT prophylaxis: Lovenox Code Status: Full code Family Communication: No family at bedside. Disposition Plan: Remains inpatient, continue IV diuresis, cardiology service has been consulted to further assist with further evaluation and management of acute systolic heart failure.patient's creatinine has bumped a little and at this moment we will hold on any further diuresis for the rest of the day and also  hold on her lisinopril at least until tomorrow 03/24/2019.  After seen by cardiology service plan is for patient to [facility] for right and left heart cath and also evaluation by the heart failure team to further adjust her medication and determine days of outpatient follow-up.   Consultants:   Cardiology has been consulted.  Procedures:   2D echo:    1. Left ventricular ejection fraction, by visual estimation, is 20 to 25%. The left ventricle has severely decreased function. There is mildly increased left ventricular hypertrophy.  2. Elevated left ventricular end-diastolic pressure.  3. Left ventricular diastolic parameters are consistent with Grade III diastolic dysfunction (restrictive).  4. The left ventricle demonstrates global hypokinesis.  5. Global right ventricle has mildly reduced systolic function.The right ventricular size is normal. No increase in right ventricular wall thickness.  6. Left atrial size was normal.  7. Right atrial size was normal.  8. The mitral valve is grossly normal. Moderate mitral valve regurgitation.  9. The tricuspid valve is grossly normal. 10. The tricuspid valve is grossly normal. Tricuspid valve regurgitation is trivial. 11. The aortic valve is tricuspid. Aortic valve regurgitation is not visualized. No evidence of aortic valve sclerosis or stenosis. 12. Pulmonic regurgitation is mild. 13. The pulmonic valve was grossly normal. Pulmonic valve regurgitation is mild. 14. The inferior vena cava is normal in size with greater than 50% respiratory variability, suggesting right atrial pressure of 3 mmHg.  Antimicrobials:  Anti-infectives (From admission, onward)   None       Subjective: Patient denies chest pain, no nausea, no vomiting and no fever.  Still short of breath on exertion and having good urine output.  Expressed overall improvement in her breathing and is no longer requiring oxygen  supplementation.  Objective: Vitals:   03/22/19 2340 03/23/19 0409 03/23/19 0500 03/23/19 0900  BP: (!) 96/59 100/66  114/73  Pulse: 69 70  77  Resp: 20 18  18   Temp: 97.9 F (36.6 C) 97.6 F (36.4 C)  97.6 F (36.4 C)  TempSrc: Oral Oral  Oral  SpO2: 92% 90%  92%  Weight:   86.3 kg   Height:        Intake/Output Summary (Last 24 hours) at 03/23/2019 1214 Last data filed at 03/23/2019 0800 Gross per 24 hour  Intake 480 ml  Output 2400 ml  Net -1920 ml   Filed Weights   03/21/19 0500 03/22/19 0451 03/23/19 0500  Weight: 90.2 kg 88.6 kg 86.3 kg    Examination: General exam: Alert, awake, oriented x 3; reports no chest pain, no nausea, no vomiting.  Still feeling short of breath with exertion but has pressed improvement in her breathing.  No requiring oxygen supplementation. Respiratory system: No frank crackles, no wheezing on examination; decreased breath sounds at the bases.  No using accessory muscles.   Cardiovascular system:RRR. No murmurs, rubs, gallops. Gastrointestinal system: Abdomen is nondistended, soft and nontender. No organomegaly or masses felt. Normal bowel sounds heard. Central nervous system: Alert and oriented. No focal neurological deficits. Extremities: No C/C/E, +pedal pulses Skin: No rashes, lesions or ulcers Psychiatry: Judgement and insight appear normal. Mood & affect appropriate.    Data Reviewed: I have personally reviewed following labs and imaging studies  CBC: Recent Labs  Lab 03/20/19 1658  WBC 8.5  NEUTROABS 6.6  HGB 12.5  HCT 39.8  MCV 83.4  PLT 99991111   Basic Metabolic Panel: Recent Labs  Lab 03/20/19 1658 03/21/19 0616 03/22/19 0443 03/23/19 0608  NA 140 140 140 138  K  3.1* 3.3* 3.3* 3.3*  CL 101 99 97* 93*  CO2 29 31 33* 32  GLUCOSE 203* 162* 171* 158*  BUN 17 15 21* 31*  CREATININE 0.91 0.93 0.96 1.25*  CALCIUM 8.9 9.0 9.4 9.5   GFR: Estimated Creatinine Clearance: 56 mL/min (A) (by C-G formula based on SCr of 1.25  mg/dL (H)).   Liver Function Tests: Recent Labs  Lab 03/20/19 1658  AST 17  ALT 23  ALKPHOS 104  BILITOT 0.7  PROT 6.7  ALBUMIN 3.5   HbA1C: Recent Labs    03/20/19 1658  HGBA1C 7.8*   CBG: Recent Labs  Lab 03/22/19 1118 03/22/19 1633 03/22/19 2116 03/23/19 0741 03/23/19 1120  GLUCAP 127* 139* 195* 134* 138*   Urine analysis:    Component Value Date/Time   COLORURINE YELLOW 05/14/2013 1205   APPEARANCEUR CLEAR 05/14/2013 1205   LABSPEC >1.030 (H) 05/14/2013 1205   PHURINE 5.5 05/14/2013 1205   GLUCOSEU NEGATIVE 05/14/2013 1205   HGBUR NEGATIVE 05/14/2013 1205   [place] 05/14/2013 1205   KETONESUR NEGATIVE 05/14/2013 1205   PROTEINUR NEGATIVE 05/14/2013 1205   UROBILINOGEN 0.2 05/14/2013 1205   NITRITE NEGATIVE 05/14/2013 1205   LEUKOCYTESUR NEGATIVE 05/14/2013 1205    Recent Results (from the past 240 hour(s))  Novel Coronavirus, NAA ([facility])     Status: None   Collection Time: 03/17/19 10:29 AM   Specimen: Nasopharyngeal(NP) swabs in vial transport medium   NASOPHARYNGE  TESTING  Result Value Ref Range Status   SARS-CoV-2, NAA Not Detected Not Detected Final    Comment: This nucleic acid amplification test was developed and its performance characteristics determined by [employer]. Nucleic acid amplification tests include RT-PCR and TMA. This test has not been FDA cleared or approved. This test has been authorized by FDA under an Emergency Use Authorization (EUA). This test is only authorized for the duration of time the declaration that circumstances exist justifying the authorization of the emergency use of in vitro diagnostic tests for detection of SARS-CoV-2 virus and/or diagnosis of COVID-19 infection under section 564(b)(1) of the Act, 21 U.S.C. [id number]) (1), unless the authorization is terminated or revoked sooner. When diagnostic testing is negative, the possibility of a false negative result should be considered in  the context of a patient's recent exposures and the presence of clinical signs and symptoms consistent with COVID-19. An individual without symptoms of COVID-19 and who is not shedding SARS-CoV-2 virus wo uld expect to have a negative (not detected) result in this assay.   Respiratory Panel by RT PCR (Flu A&B, Covid) - Nasopharyngeal Swab     Status: None   Collection Time: 03/20/19  5:00 PM   Specimen: Nasopharyngeal Swab  Result Value Ref Range Status   SARS Coronavirus 2 by RT PCR NEGATIVE NEGATIVE Final    Comment: (NOTE) SARS-CoV-2 target nucleic acids are NOT DETECTED. The SARS-CoV-2 RNA is generally detectable in upper respiratoy specimens during the acute phase of infection. The lowest concentration of SARS-CoV-2 viral copies this assay can detect is 131 copies/mL. A negative result does not preclude SARS-Cov-2 infection and should not be used as the sole basis for treatment or other patient management decisions. A negative result may occur with  improper specimen collection/handling, submission of specimen other than nasopharyngeal swab, presence of viral mutation(s) within the areas targeted by this assay, and inadequate number of viral copies (<131 copies/mL). A negative result must be combined with clinical observations, patient history, and epidemiological information. The expected result is  Negative. Fact Sheet for Patients:  PinkCheek.be Fact Sheet for Healthcare Providers:  GravelBags.it This test is not yet ap proved or cleared by the [place] FDA and  has been authorized for detection and/or diagnosis of SARS-CoV-2 by FDA under an Emergency Use Authorization (EUA). This EUA will remain  in effect (meaning this test can be used) for the duration of the COVID-19 declaration under Section 564(b)(1) of the Act, 21 U.S.C. section 360bbb-3(b)(1), unless the authorization is terminated or revoked sooner.     Influenza A by PCR NEGATIVE NEGATIVE Final   Influenza B by PCR NEGATIVE NEGATIVE Final    Comment: (NOTE) The Xpert Xpress SARS-CoV-2/FLU/RSV assay is intended as an aid in  the diagnosis of influenza from Nasopharyngeal swab specimens and  should not be used as a sole basis for treatment. Nasal washings and  aspirates are unacceptable for Xpert Xpress SARS-CoV-2/FLU/RSV  testing. Fact Sheet for Patients: PinkCheek.be Fact Sheet for Healthcare Providers: GravelBags.it This test is not yet approved or cleared by the [place] FDA and  has been authorized for detection and/or diagnosis of SARS-CoV-2 by  FDA under an Emergency Use Authorization (EUA). This EUA will remain  in effect (meaning this test can be used) for the duration of the  Covid-19 declaration under Section 564(b)(1) of the Act, 21  U.S.C. section 360bbb-3(b)(1), unless the authorization is  terminated or revoked. Performed at [facility], [address] [place], [place] [address]      Radiology Studies: ECHOCARDIOGRAM COMPLETE  Result Date: 03/21/2019   ECHOCARDIOGRAM REPORT   Patient Name:   [name] Date of Exam: 03/21/2019 Medical Rec #:  [id number]    Height:       67.0 in Accession #:    [id number]   Weight:       198.9 lb Date of Birth:  [birth year]    BSA:          2.02 m Patient Age:    15 years     BP:           124/65 mmHg Patient Gender: F            HR:           81 bpm. Exam Location:  [facility] Procedure: 2D Echo, Cardiac Doppler and Color Doppler Indications:    Acute CHF (congestive heart failure)  History:        Patient has no prior history of Echocardiogram examinations.                 CHF; Risk Factors:Current Smoker, Diabetes, Hypertension and                 Dyslipidemia.  Sonographer:    Alvino Chapel RCS Referring Phys: [place]  1. Left ventricular ejection fraction, by visual estimation, is 20 to 25%. The left  ventricle has severely decreased function. There is mildly increased left ventricular hypertrophy.  2. Elevated left ventricular end-diastolic pressure.  3. Left ventricular diastolic parameters are consistent with Grade III diastolic dysfunction (restrictive).  4. The left ventricle demonstrates global hypokinesis.  5. Global right ventricle has mildly reduced systolic function.The right ventricular size is normal. No increase in right ventricular wall thickness.  6. Left atrial size was normal.  7. Right atrial size was normal.  8. The mitral valve is grossly normal. Moderate mitral valve regurgitation.  9. The tricuspid valve is grossly normal. 10. The tricuspid valve is grossly normal. Tricuspid  valve regurgitation is trivial. 11. The aortic valve is tricuspid. Aortic valve regurgitation is not visualized. No evidence of aortic valve sclerosis or stenosis. 12. Pulmonic regurgitation is mild. 13. The pulmonic valve was grossly normal. Pulmonic valve regurgitation is mild. 14. The inferior vena cava is normal in size with greater than 50% respiratory variability, suggesting right atrial pressure of 3 mmHg. FINDINGS  Left Ventricle: Left ventricular ejection fraction, by visual estimation, is 20 to 25%. The left ventricle has severely decreased function. The left ventricle demonstrates global hypokinesis. The left ventricular internal cavity size was the left ventricle is normal in size. There is mildly increased left ventricular hypertrophy. Concentric left ventricular hypertrophy. Left ventricular diastolic parameters are consistent with Grade III diastolic dysfunction (restrictive). Elevated left ventricular end-diastolic pressure. Right Ventricle: The right ventricular size is normal. No increase in right ventricular wall thickness. Global RV systolic function is has mildly reduced systolic function. Left Atrium: Left atrial size was normal in size. Right Atrium: Right atrial size was normal in size  Pericardium: There is no evidence of pericardial effusion. Mitral Valve: The mitral valve is grossly normal. Moderate mitral valve regurgitation. Tricuspid Valve: The tricuspid valve is grossly normal. Tricuspid valve regurgitation is trivial. Aortic Valve: The aortic valve is tricuspid. Aortic valve regurgitation is not visualized. The aortic valve is structurally normal, with no evidence of sclerosis or stenosis. Pulmonic Valve: The pulmonic valve was grossly normal. Pulmonic valve regurgitation is mild. Pulmonic regurgitation is mild. Aorta: The aortic root is normal in size and structure. Venous: The inferior vena cava is normal in size with greater than 50% respiratory variability, suggesting right atrial pressure of 3 mmHg. IAS/Shunts: No atrial level shunt detected by color flow Doppler.  LEFT VENTRICLE PLAX 2D LVIDd:         5.07 cm       Diastology LVIDs:         4.75 cm       LV e' lateral:   7.07 cm/s LV PW:         1.12 cm       LV E/e' lateral: 17.4 LV IVS:        1.11 cm       LV e' medial:    5.77 cm/s LVOT diam:     1.80 cm       LV E/e' medial:  21.3 LV SV:         17 ml LV SV Index:   8.22 LVOT Area:     2.54 cm  LV Volumes (MOD) LV area d, A2C:    34.70 cm LV area d, A4C:    36.10 cm LV area s, A2C:    29.60 cm LV area s, A4C:    30.10 cm LV major d, A2C:   7.80 cm LV major d, A4C:   8.44 cm LV major s, A2C:   7.83 cm LV major s, A4C:   8.18 cm LV vol d, MOD A2C: 130.0 ml LV vol d, MOD A4C: 124.0 ml LV vol s, MOD A2C: 96.5 ml LV vol s, MOD A4C: 91.5 ml LV SV MOD A2C:     33.5 ml LV SV MOD A4C:     124.0 ml LV SV MOD BP:      35.9 ml RIGHT VENTRICLE RV S prime:     12.80 cm/s TAPSE (M-mode): 1.9 cm LEFT ATRIUM           Index       RIGHT  ATRIUM           Index LA diam:      4.30 cm 2.13 cm/m  RA Area:     11.80 cm LA Vol (A2C): 67.4 ml 33.41 ml/m RA Volume:   24.60 ml  12.19 ml/m LA Vol (A4C): 60.8 ml 30.14 ml/m  AORTIC VALVE LVOT Vmax:   83.50 cm/s LVOT Vmean:  49.800 cm/s LVOT VTI:     0.122 m  AORTA Ao Root diam: 3.00 cm MITRAL VALVE MV Area (PHT): 6.32 cm              SHUNTS MV PHT:        34.80 msec            Systemic VTI:  0.12 m MV Decel Time: 120 msec              Systemic Diam: 1.80 cm MR Peak grad:    92.5 mmHg MR Mean grad:    55.0 mmHg MR Vmax:         481.00 cm/s MR Vmean:        339.0 cm/s MR PISA:         1.01 cm MR PISA Eff ROA: 7 mm MR PISA Radius:  0.40 cm MV E velocity: 123.00 cm/s 103 cm/s MV A velocity: 56.30 cm/s  70.3 cm/s MV E/A ratio:  2.18        1.5  Kate Sable MD Electronically signed by Kate Sable MD Signature Date/Time: 03/21/2019/12:49:03 PM    Final     Scheduled Meds: . ALPRAZolam  1 mg Oral QHS  . aspirin  81 mg Oral Pre-Cath  . atorvastatin  40 mg Oral q1800  . carvedilol  6.25 mg Oral BID WC  . enoxaparin (LOVENOX) injection  40 mg Subcutaneous Q24H  . FLUoxetine  20 mg Oral q morning - 10a  . furosemide  40 mg Oral Daily  . glipiZIDE  5 mg Oral BID AC  . insulin aspart  0-15 Units Subcutaneous TID WC  . insulin aspart  0-5 Units Subcutaneous QHS  . lisinopril  5 mg Oral q morning - 10a  . LORazepam  0.5 mg Oral TID  . Melatonin  3 mg Oral QHS  . nicotine  21 mg Transdermal Daily  . pantoprazole  40 mg Oral Daily  . potassium chloride  20 mEq Oral BID  . sodium chloride flush  3 mL Intravenous Q12H   Continuous Infusions: . sodium chloride    . sodium chloride       LOS: 3 days    Time spent: 30 minutes.    Barton Dubois, MD [facility] Pager [phone]   03/23/2019, 12:14 PM

## 2019-03-23 NOTE — Consult Note (Addendum)
Cardiology Consultation:   Patient ID: [name] MRN: [id number]; DOB: May 04, 1960  Admit date: 03/20/2019 Date of Consult: 03/23/2019  Primary Care Provider: [name], MD Primary Cardiologist: Rozann Lesches, MD  (New) Primary Electrophysiologist:  None     Patient Profile:   [name] is a 59 y.o. female with a hx of HTN, DM who is being seen today for the evaluation of new CHF at the request of Dr. Dyann Kief.  History of Present Illness:   [name]  Is a 59 yo female with history of HTN, DM, HLD tobacco abuse admitted with new CHF-O2 sats 88%, cardiomegaly, BNP 464. Echo 03/21/19 LVEF 20-25% mild LVH, grade 3 DD, global HK, mod MR.  Patient says she was doing well until 1 1/2 weeks ago when she had progressive shortness of breath that worsened and had some associated chest tightness. Smokes 1/2 ppd and was trying to use inhalers without relief. She got short of breath going to the bathroom this am. Her father had heart failure in his 17's, defibrillator and eventually heart transplant in [place]. He died of cancer/infection. No recent infection.  Heart Pathway Score:     Past Medical History:  Diagnosis Date  . Anal fissure   . Anxiety   . Depression   . GERD (gastroesophageal reflux disease)   . History of seizures as a child    None since age 57  . Hypercholesteremia   . Hypertension   . Type 2 diabetes mellitus ([place])     Past Surgical History:  Procedure Laterality Date  . BACK SURGERY  2005   lumbar disckectomy  . COLONOSCOPY  10/01/2011   Procedure: COLONOSCOPY;  Surgeon: [name], MD;  [facility];  Service: Endoscopy;  Laterality: N/A;  730  . COLONOSCOPY N/A 10/04/2016   Procedure: COLONOSCOPY;  Surgeon: [name], MD;  [facility];  Service: Endoscopy;  Laterality: N/A;  10:30  . DILATION AND CURETTAGE OF UTERUS    . FOOT BONE EXCISION     right  . POLYPECTOMY  10/04/2016   Procedure: POLYPECTOMY;  Surgeon: [name], MD;  [facility]: [facility];  Service: Endoscopy;;  colon     Home Medications:  Prior to Admission medications   Medication Sig Start Date End Date Taking? Authorizing Provider  albuterol (VENTOLIN HFA) 108 (90 Base) MCG/ACT inhaler Inhale 2 puffs into the lungs every 6 (six) hours as needed for wheezing or shortness of breath.   Yes [provider]  ALPRAZolam Duanne Moron) 1 MG tablet Take 1 mg by mouth at bedtime.   Yes [provider]  atorvastatin (LIPITOR) 40 MG tablet Take 40 mg by mouth daily.   Yes [provider]  azithromycin (ZITHROMAX) 250 MG tablet Take 250-500 mg by mouth See admin instructions. Prescribed and filled on 03/20/2019   Yes [provider]  Cholecalciferol (VITAMIN D3) 125 MCG (5000 UT) CAPS Take 1 capsule by mouth daily.   Yes [provider]  dicyclomine (BENTYL) 10 MG capsule TAKE ONE CAPSULE BY MOUTH TWICE A DAY BEFORE A MEAL Patient taking differently: Take 10 mg by mouth 3 (three) times daily before meals.  09/30/17  Yes Setzer, Terri L, NP  FLUoxetine (PROZAC) 20 MG capsule Take 20 mg by mouth every morning.    Yes [provider]  fluticasone (FLONASE) 50 MCG/ACT nasal spray Place 2 sprays into both nostrils daily as needed for allergies or rhinitis.  Yes [provider]  glipiZIDE (GLUCOTROL) 5 MG tablet Take 5 mg by mouth 2 (two) times daily before a meal.    Yes [provider]  ibuprofen (ADVIL) 200 MG tablet Take 200 mg by mouth every 6 (six) hours as needed for mild pain or moderate pain.   Yes [provider]  lisinopril (PRINIVIL,ZESTRIL) 5 MG tablet Take 5 mg by mouth every morning.    Yes [provider]  LORazepam (ATIVAN) 0.5 MG tablet Take 0.5 mg by mouth 3 (three) times daily.    Yes [provider]  Melatonin (SM MELATONIN) 3 MG TABS Take 3 mg by mouth at bedtime.   Yes [provider]  pantoprazole (PROTONIX) 40 MG tablet Take 1  tablet (40 mg total) by mouth daily. 05/19/13  Yes Black, Lezlie Octave, NP  pioglitazone (ACTOS) 45 MG tablet Take 45 mg by mouth daily.    Yes [provider]  predniSONE (DELTASONE) 10 MG tablet Take 10 mg by mouth daily with breakfast. [id number]   Yes [provider]    Inpatient Medications: Scheduled Meds: . ALPRAZolam  1 mg Oral QHS  . atorvastatin  40 mg Oral q1800  . carvedilol  6.25 mg Oral BID WC  . enoxaparin (LOVENOX) injection  40 mg Subcutaneous Q24H  . FLUoxetine  20 mg Oral q morning - 10a  . furosemide  40 mg Oral Daily  . glipiZIDE  5 mg Oral BID AC  . insulin aspart  0-15 Units Subcutaneous TID WC  . insulin aspart  0-5 Units Subcutaneous QHS  . lisinopril  5 mg Oral q morning - 10a  . LORazepam  0.5 mg Oral TID  . Melatonin  3 mg Oral QHS  . nicotine  21 mg Transdermal Daily  . pantoprazole  40 mg Oral Daily  . potassium chloride  20 mEq Oral BID  . sodium chloride flush  3 mL Intravenous Q12H   Continuous Infusions: . sodium chloride     PRN Meds: sodium chloride, acetaminophen, dicyclomine, ondansetron (ZOFRAN) IV, sodium chloride flush  Allergies:   No Known Allergies  Social History:   Social History   Socioeconomic History  . Marital status: Divorced    Spouse name: Not on file  . Number of children: Not on file  . Years of education: Not on file  . Highest education level: Not on file  Occupational History  . Not on file  Tobacco Use  . Smoking status: Current Every Day Smoker    Packs/day: 0.50    Years: 37.00    Pack years: 18.50    Types: Cigarettes  . Smokeless tobacco: Never Used  Substance and Sexual Activity  . Alcohol use: No  . Drug use: No  . Sexual activity: Yes    Birth control/protection: None  Other Topics Concern  . Not on file  Social History Narrative  . Not on file   Social Determinants of Health   Financial Resource Strain: Unknown  . Difficulty of Paying Living Expenses: Patient refused   Food Insecurity: Unknown  . Worried About Charity fundraiser in the Last Year: Patient refused  . Ran Out of Food in the Last Year: Patient refused  Transportation Needs: Unknown  . Lack of Transportation (Medical): Patient refused  . Lack of Transportation (Non-Medical): Patient refused  Physical Activity: Unknown  . Days of Exercise per Week: Patient refused  . Minutes of Exercise per Session: Patient refused  Stress: Unknown  .  Feeling of Stress : Patient refused  Social Connections: Unknown  . Frequency of Communication with Friends and Family: Patient refused  . Frequency of Social Gatherings with Friends and Family: Patient refused  . Attends Religious Services: Patient refused  . Active Member of Clubs or Organizations: Patient refused  . Attends Archivist Meetings: Patient refused  . Marital Status: Patient refused  Intimate Partner Violence: Unknown  . Fear of Current or Ex-Partner: Patient refused  . Emotionally Abused: Patient refused  . Physically Abused: Patient refused  . Sexually Abused: Patient refused    Family History:     Family History  Problem Relation Age of Onset  . Arthritis Mother   . Hernia Mother   . Constipation Mother   . Colon cancer Neg Hx      ROS:  Please see the history of present illness.  Review of Systems  Constitution: Positive for weight gain.  HENT: Negative.   Eyes: Negative.   Cardiovascular: Positive for chest pain and leg swelling.  Respiratory: Positive for shortness of breath and wheezing.   Hematologic/Lymphatic: Negative.   Musculoskeletal: Negative.  Negative for joint pain.  Gastrointestinal: Negative.   Genitourinary: Negative.   Neurological: Negative.    All other ROS reviewed and negative.     Physical Exam/Data:   Vitals:   03/22/19 2114 03/22/19 2340 03/23/19 0409 03/23/19 0500  BP: 116/73 (!) 96/59 100/66   Pulse: 74 69 70   Resp: 17 20 18    Temp: 98.4 F (36.9 C) 97.9 F (36.6 C) 97.6 F  (36.4 C)   TempSrc: Oral Oral Oral   SpO2: 93% 92% 90%   Weight:    86.3 kg  Height:        Intake/Output Summary (Last 24 hours) at 03/23/2019 0927 Last data filed at 03/23/2019 0426 Gross per 24 hour  Intake 240 ml  Output 4000 ml  Net -3760 ml   Last 3 Weights 03/23/2019 03/22/2019 03/21/2019  Weight (lbs) 190 lb 3.2 oz 195 lb 6.4 oz 198 lb 13.7 oz  Weight (kg) 86.274 kg 88.633 kg 90.2 kg     Body mass index is 29.79 kg/m.  General:  Well nourished, well developed, in no acute distress  HEENT: normal Lymph: no adenopathy Neck: no JVD Endocrine:  No thryomegaly Vascular: No carotid bruits; FA pulses 2+ bilaterally without bruits  Cardiac:  normal S1, S2; RRR; 1/6 systolic murmur apex Lungs:  Decreased breath sounds with wheezing throughout Abd: soft, nontender, no hepatomegaly  Ext: no edema Musculoskeletal:  No deformities, BUE and BLE strength normal and equal Skin: warm and dry  Neuro:  CNs 2-12 intact, no focal abnormalities noted Psych:  Normal affect   EKG:  The EKG was personally reviewed and demonstrates:  NSR with poor R wave progression ant similar to 2015 but new ST  T wave changes laterally. Telemetry:  Telemetry was personally reviewed and demonstrates:  NSR with 6 beat wide complex at 90/m  Relevant CV Studies: 2Decho 03/21/19 IMPRESSIONS      1. Left ventricular ejection fraction, by visual estimation, is 20 to 25%. The left ventricle has severely decreased function. There is mildly increased left ventricular hypertrophy.  2. Elevated left ventricular end-diastolic pressure.  3. Left ventricular diastolic parameters are consistent with Grade III diastolic dysfunction (restrictive).  4. The left ventricle demonstrates global hypokinesis.  5. Global right ventricle has mildly reduced systolic function.The right ventricular size is normal. No increase in right ventricular wall thickness.  6. Left atrial size was normal.  7. Right atrial size was normal.  8.  The mitral valve is grossly normal. Moderate mitral valve regurgitation.  9. The tricuspid valve is grossly normal. 10. The tricuspid valve is grossly normal. Tricuspid valve regurgitation is trivial. 11. The aortic valve is tricuspid. Aortic valve regurgitation is not visualized. No evidence of aortic valve sclerosis or stenosis. 12. Pulmonic regurgitation is mild. 13. The pulmonic valve was grossly normal. Pulmonic valve regurgitation is mild. 14. The inferior vena cava is normal in size with greater than 50% respiratory variability, suggesting right atrial pressure of 3 mmHg.   FINDINGS  Left Ventricle: Left ventricular ejection fraction, by visual estimation, is 20 to 25%. The left ventricle has severely decreased function. The left ventricle demonstrates global hypokinesis. The left ventricular internal cavity size was the left  ventricle is normal in size. There is mildly increased left ventricular hypertrophy. Concentric left ventricular hypertrophy. Left ventricular diastolic parameters are consistent with Grade III diastolic dysfunction (restrictive). Elevated left  ventricular end-diastolic pressure.   Right Ventricle: The right ventricular size is normal. No increase in right ventricular wall thickness. Global RV systolic function is has mildly reduced systolic function.   Left Atrium: Left atrial size was normal in size.   Right Atrium: Right atrial size was normal in size   Pericardium: There is no evidence of pericardial effusion.   Mitral Valve: The mitral valve is grossly normal. Moderate mitral valve regurgitation.   Tricuspid Valve: The tricuspid valve is grossly normal. Tricuspid valve regurgitation is trivial.   Aortic Valve: The aortic valve is tricuspid. Aortic valve regurgitation is not visualized. The aortic valve is structurally normal, with no evidence of sclerosis or stenosis.   Pulmonic Valve: The pulmonic valve was grossly normal. Pulmonic valve regurgitation  is mild. Pulmonic regurgitation is mild.   Aorta: The aortic root is normal in size and structure.   Venous: The inferior vena cava is normal in size with greater than 50% respiratory variability, suggesting right atrial pressure of 3 mmHg.   IAS/Shunts: No atrial level shunt detected by color flow Doppler.   Laboratory Data:  High Sensitivity Troponin:   Recent Labs  Lab 03/20/19 1658 03/20/19 1840  TROPONINIHS 27* 33*     Chemistry Recent Labs  Lab 03/21/19 0616 03/22/19 0443 03/23/19 0608  NA 140 140 138  K 3.3* 3.3* 3.3*  CL 99 97* 93*  CO2 31 33* 32  GLUCOSE 162* 171* 158*  BUN 15 21* 31*  CREATININE 0.93 0.96 1.25*  CALCIUM 9.0 9.4 9.5  GFRNONAA >60 >60 48*  GFRAA >60 >60 55*  ANIONGAP 10 10 13     Recent Labs  Lab 03/20/19 1658  PROT 6.7  ALBUMIN 3.5  AST 17  ALT 23  ALKPHOS 104  BILITOT 0.7   Hematology Recent Labs  Lab 03/20/19 1658  WBC 8.5  RBC 4.77  HGB 12.5  HCT 39.8  MCV 83.4  MCH 26.2  MCHC 31.4  RDW 14.8  PLT 267   BNP Recent Labs  Lab 03/20/19 1658  BNP 464.0*    DDimer  Recent Labs  Lab 03/20/19 1658  DDIMER 0.90*     Radiology/Studies:  DG Chest Portable 1 View  Result Date: 03/20/2019 CLINICAL DATA:  59 year old female with shortness of breath. EXAM: PORTABLE CHEST 1 VIEW COMPARISON:  None. FINDINGS: There is cardiomegaly with vascular congestion and mild edema. Small bilateral pleural effusions may be present. There is no pneumothorax. No acute osseous  pathology. IMPRESSION: Cardiomegaly with findings of CHF. Superimposed pneumonia is not excluded. Clinical correlation is recommended. Electronically Signed   By: Anner Crete M.D.   On: 03/20/2019 17:46   ECHOCARDIOGRAM COMPLETE  Result Date: 03/21/2019   ECHOCARDIOGRAM REPORT   Patient Name:   MALKIE SHELDEN Date of Exam: 03/21/2019 Medical Rec #:  [id number]    Height:       67.0 in Accession #:    [id number]   Weight:       198.9 lb Date of Birth:  24-Feb-1961     BSA:          2.02 m Patient Age:    62 years     BP:           124/65 mmHg Patient Gender: F            HR:           81 bpm. Exam Location:  [facility] Procedure: 2D Echo, Cardiac Doppler and Color Doppler Indications:    Acute CHF (congestive heart failure)  History:        Patient has no prior history of Echocardiogram examinations.                 CHF; Risk Factors:Current Smoker, Diabetes, Hypertension and                 Dyslipidemia.  Sonographer:    Alvino Chapel RCS Referring Phys: [place]  1. Left ventricular ejection fraction, by visual estimation, is 20 to 25%. The left ventricle has severely decreased function. There is mildly increased left ventricular hypertrophy.  2. Elevated left ventricular end-diastolic pressure.  3. Left ventricular diastolic parameters are consistent with Grade III diastolic dysfunction (restrictive).  4. The left ventricle demonstrates global hypokinesis.  5. Global right ventricle has mildly reduced systolic function.The right ventricular size is normal. No increase in right ventricular wall thickness.  6. Left atrial size was normal.  7. Right atrial size was normal.  8. The mitral valve is grossly normal. Moderate mitral valve regurgitation.  9. The tricuspid valve is grossly normal. 10. The tricuspid valve is grossly normal. Tricuspid valve regurgitation is trivial. 11. The aortic valve is tricuspid. Aortic valve regurgitation is not visualized. No evidence of aortic valve sclerosis or stenosis. 12. Pulmonic regurgitation is mild. 13. The pulmonic valve was grossly normal. Pulmonic valve regurgitation is mild. 14. The inferior vena cava is normal in size with greater than 50% respiratory variability, suggesting right atrial pressure of 3 mmHg. FINDINGS  Left Ventricle: Left ventricular ejection fraction, by visual estimation, is 20 to 25%. The left ventricle has severely decreased function. The left ventricle demonstrates global hypokinesis.  The left ventricular internal cavity size was the left ventricle is normal in size. There is mildly increased left ventricular hypertrophy. Concentric left ventricular hypertrophy. Left ventricular diastolic parameters are consistent with Grade III diastolic dysfunction (restrictive). Elevated left ventricular end-diastolic pressure. Right Ventricle: The right ventricular size is normal. No increase in right ventricular wall thickness. Global RV systolic function is has mildly reduced systolic function. Left Atrium: Left atrial size was normal in size. Right Atrium: Right atrial size was normal in size Pericardium: There is no evidence of pericardial effusion. Mitral Valve: The mitral valve is grossly normal. Moderate mitral valve regurgitation. Tricuspid Valve: The tricuspid valve is grossly normal. Tricuspid valve regurgitation is trivial. Aortic Valve: The aortic valve is tricuspid. Aortic valve regurgitation is not visualized. The aortic  valve is structurally normal, with no evidence of sclerosis or stenosis. Pulmonic Valve: The pulmonic valve was grossly normal. Pulmonic valve regurgitation is mild. Pulmonic regurgitation is mild. Aorta: The aortic root is normal in size and structure. Venous: The inferior vena cava is normal in size with greater than 50% respiratory variability, suggesting right atrial pressure of 3 mmHg. IAS/Shunts: No atrial level shunt detected by color flow Doppler.  LEFT VENTRICLE PLAX 2D LVIDd:         5.07 cm       Diastology LVIDs:         4.75 cm       LV e' lateral:   7.07 cm/s LV PW:         1.12 cm       LV E/e' lateral: 17.4 LV IVS:        1.11 cm       LV e' medial:    5.77 cm/s LVOT diam:     1.80 cm       LV E/e' medial:  21.3 LV SV:         17 ml LV SV Index:   8.22 LVOT Area:     2.54 cm  LV Volumes (MOD) LV area d, A2C:    34.70 cm LV area d, A4C:    36.10 cm LV area s, A2C:    29.60 cm LV area s, A4C:    30.10 cm LV major d, A2C:   7.80 cm LV major d, A4C:   8.44 cm LV  major s, A2C:   7.83 cm LV major s, A4C:   8.18 cm LV vol d, MOD A2C: 130.0 ml LV vol d, MOD A4C: 124.0 ml LV vol s, MOD A2C: 96.5 ml LV vol s, MOD A4C: 91.5 ml LV SV MOD A2C:     33.5 ml LV SV MOD A4C:     124.0 ml LV SV MOD BP:      35.9 ml RIGHT VENTRICLE RV S prime:     12.80 cm/s TAPSE (M-mode): 1.9 cm LEFT ATRIUM           Index       RIGHT ATRIUM           Index LA diam:      4.30 cm 2.13 cm/m  RA Area:     11.80 cm LA Vol (A2C): 67.4 ml 33.41 ml/m RA Volume:   24.60 ml  12.19 ml/m LA Vol (A4C): 60.8 ml 30.14 ml/m  AORTIC VALVE LVOT Vmax:   83.50 cm/s LVOT Vmean:  49.800 cm/s LVOT VTI:    0.122 m  AORTA Ao Root diam: 3.00 cm MITRAL VALVE MV Area (PHT): 6.32 cm              SHUNTS MV PHT:        34.80 msec            Systemic VTI:  0.12 m MV Decel Time: 120 msec              Systemic Diam: 1.80 cm MR Peak grad:    92.5 mmHg MR Mean grad:    55.0 mmHg MR Vmax:         481.00 cm/s MR Vmean:        339.0 cm/s MR PISA:         1.01 cm MR PISA Eff ROA: 7 mm MR PISA Radius:  0.40 cm MV E velocity: 123.00 cm/s 103 cm/s MV  A velocity: 56.30 cm/s  70.3 cm/s MV E/A ratio:  2.18        1.5  Kate Sable MD Electronically signed by Kate Sable MD Signature Date/Time: 03/21/2019/12:49:03 PM    Final        HEAR Score (for undifferentiated chest pain):       Assessment and Plan:   1. Acute systolic and diastolic CHF with new LVD EF 20-25% with global HK, grade 3 DD, mod MR, some RV dysfunction-has diuresed 3.7 L since admission on Lasix 40 mg IV BID. Well compensated and Crt going up. Would decrease lasix to 40 mg po. Still short of breath and wheezing but probably due to COPD. Some chest tightness on admission but none before or since. Need to rule out ischemic heart disease. Favor L/R heart cath. Would benefit from advanced CHF clinic post hospitalization. 2. Hypokalemia-needs replaced 3. HTN running low.on coreg 6.25 mg bid, lisinopril 5 mg  4. HLD on lipitor 5. DM not well controlled per  patient. No A1C in chart 6. Tobacco abuse needs to quit      For questions or updates, please contact [place] Please consult [email or web address] for contact info under     Signed, Ermalinda Barrios, PA-C 03/23/2019 9:27 AM     Attending note:  Patient seen and examined.  Case discussed with Ms. Delene Ruffini.  Ms. Bhat presents with a history of hypertension, type 2 diabetes mellitus, hyperlipidemia, longstanding tobacco abuse, and family history of nonischemic cardiomyopathy in her father who underwent cardiac transplantation at [facility] several years ago.  No other known family members with cardiomyopathy.  She presents with a 2-week history of worsening dyspnea on exertion and fatigue, vague sense of chest tightness, no palpitations or syncope.  She was noted to be hypoxic with elevated BNP and chest x-ray showing cardiomegaly with vascular congestion and mild pulmonary edema.  High-sensitivity troponin I levels were minimally abnormal in the 20s to 30s and in flat pattern not suggestive of ACS.  SARS coronavirus 2 test negative.  D-dimer 0.90, but pulmonary embolus not suspected based on presentation.  On examination this morning she is in no distress.  Not short of breath at rest, but does have symptoms with moving around in her room.  Also with mild expiratory wheezes.  She is afebrile, systolic blood pressure mid 90s to 115, heart rate in the 70s in sinus rhythm by telemetry which I personally reviewed.  She had a burst of NSVT overnight that was asymptomatic.  Mild expiratory wheezes on examination of the lungs.  Cardiac exam reveals indistinct PMI with RRR no gallop.  She has no pitting edema.  Lab work shows potassium 3.3, BUN 31, creatinine 1.25, hemoglobin 12.5, platelets 267, hemoglobin A1c 7.8%.  I personally reviewed her ECG from 03/20/2019 which shows sinus rhythm with IVCD and repolarization abnormalities.  Echocardiogram performed during this admission shows LVEF 20 to 25% with  mild LVH, increased LVEDP, restrictive diastolic filling pattern, global hypokinesis, mild RV dysfunction, and moderate mitral regurgitation.  Patient presents with acute combined heart failure, symptom onset within the last 2 weeks.  Cardiac risk factors include type 2 diabetes mellitus, hypertension, hyperlipidemia, and longstanding tobacco use.  No clear evidence of ACS however by high-sensitivity troponin I levels.  She states that her father had a nonischemic cardiomyopathy and ultimately underwent cardiac transplantation at [facility] several years ago, but no other obvious family history of cardiomyopathy.  After discussion of the risks and benefits, plan is  to have her transferred to our cardiology service at [facility] in anticipation of a right and left heart catheterization and then further medication adjustments with input from the heart failure team.  We are stopping lisinopril for now.  Diuretics also cut back with recent bump in creatinine.  Satira Sark, M.D., F.A.C.C.

## 2019-03-23 NOTE — Consult Note (Signed)
Advanced Heart Failure Team Consult Note   Primary Physician: [name], MD PCP-Cardiologist:  Rozann Lesches, MD  Reason for Consultation: CHF  HPI:    [name] is seen today for evaluation of CHF at the request of Dr. Domenic Polite.   Patient has history of DM, HTN, hyperlipidemia, and smoking.  She was admitted on 1/22 for about 1 week of shortness of breath. She had been started on azithromycin and prednisone by PCP and was taking inhalers for ?COPD exacerbation without relief.  COVID negative.  Dyspnea primarily exertional, no orthopnea. No chest pain reported.  In ER, pro-BNP was elevated and CXR suggestive of volume overload. Slightly elevated HS-TnI with no trend, not suggestive of ACS.  She was admitted for management of new diagnosis of CHF.  Echo was done, showed EF 20-25% with mild RV dysfunction. She was started on IV Lasix, now on po.  She was transferred from [facility] to [facility] for RHC/LHC to assess filling pressures/CO and also to assess for coronary disease as etiology.   Of note, her father developed CHF in his 36s, nonischemic cardiomyopathy.  He had ICD and later heart transplant.   Cath was done today, see below.   LHC/RHC: Coronary Findings  Diagnostic Dominance: Right Left Main  Short, no significant disease.  Left Anterior Descending  25% ostial narrowing. Mild luminal irregularities.  Left Circumflex  No significant coronary disease.  Right Coronary Artery  No significant coronary disease.  Intervention  No interventions have been documented. Right Heart  Right Heart Pressures RHC Procedural Findings: Hemodynamics (mmHg) RA mean 2 RV 27/4 PA 28/5, mean 18 PCWP mean 7 LV 112/15 AO 111/59  Oxygen saturations: PA 64% AO 97%  Cardiac Output (Fick) 4.69  Cardiac Index (Fick) 2.37     Review of Systems: All systems reviewed and negative except as per HPI.   Home Medications Prior to Admission medications   Medication Sig Start Date End  Date Taking? Authorizing Provider  albuterol (VENTOLIN HFA) 108 (90 Base) MCG/ACT inhaler Inhale 2 puffs into the lungs every 6 (six) hours as needed for wheezing or shortness of breath.   Yes [provider]  ALPRAZolam Duanne Moron) 1 MG tablet Take 1 mg by mouth at bedtime.   Yes [provider]  atorvastatin (LIPITOR) 40 MG tablet Take 40 mg by mouth daily.   Yes [provider]  azithromycin (ZITHROMAX) 250 MG tablet Take 250-500 mg by mouth See admin instructions. Prescribed and filled on 03/20/2019   Yes [provider]  Cholecalciferol (VITAMIN D3) 125 MCG (5000 UT) CAPS Take 1 capsule by mouth daily.   Yes [provider]  dicyclomine (BENTYL) 10 MG capsule TAKE ONE CAPSULE BY MOUTH TWICE A DAY BEFORE A MEAL Patient taking differently: Take 10 mg by mouth 3 (three) times daily before meals.  09/30/17  Yes Setzer, Terri L, NP  FLUoxetine (PROZAC) 20 MG capsule Take 20 mg by mouth every morning.    Yes [provider]  fluticasone (FLONASE) 50 MCG/ACT nasal spray Place 2 sprays into both nostrils daily as needed for allergies or rhinitis.   Yes [provider]  glipiZIDE (GLUCOTROL) 5 MG tablet Take 5 mg by mouth 2 (two) times daily before a meal.    Yes [provider]  ibuprofen (ADVIL) 200 MG tablet Take 200 mg by mouth every 6 (six) hours as needed for mild pain or moderate pain.   Yes [provider]  lisinopril (PRINIVIL,ZESTRIL) 5 MG  tablet Take 5 mg by mouth every morning.    Yes [provider]  LORazepam (ATIVAN) 0.5 MG tablet Take 0.5 mg by mouth 3 (three) times daily.    Yes [provider]  Melatonin (SM MELATONIN) 3 MG TABS Take 3 mg by mouth at bedtime.   Yes [provider]  pantoprazole (PROTONIX) 40 MG tablet Take 1 tablet (40 mg total) by mouth daily. 05/19/13  Yes Black, Lezlie Octave, NP  pioglitazone (ACTOS) 45 MG tablet Take 45 mg by mouth daily.    Yes [provider]    predniSONE (DELTASONE) 10 MG tablet Take 10 mg by mouth daily with breakfast. [id number]   Yes [provider]    Past Medical History: 1. Type 2 diabetes.  2. HTN 3. Hyperlipidemia 4. Depression 5. Remote history of seizures 6. GERD 7. Cardiomyopathy: Echo (1/21) with EF 20-25%, mild LVH, mildly decreased RV systolic function, moderate MR. - Father with history of heart transplant.  8. Active smoker  Past Surgical History: Past Surgical History:  Procedure Laterality Date  . BACK SURGERY  2005   lumbar disckectomy  . COLONOSCOPY  10/01/2011   Procedure: COLONOSCOPY;  Surgeon: [name], MD;  Location: [facility];  Service: Endoscopy;  Laterality: N/A;  730  . COLONOSCOPY N/A 10/04/2016   Procedure: COLONOSCOPY;  Surgeon: [name], MD;  Location: [facility];  Service: Endoscopy;  Laterality: N/A;  10:30  . DILATION AND CURETTAGE OF UTERUS    . FOOT BONE EXCISION     right  . POLYPECTOMY  10/04/2016   Procedure: POLYPECTOMY;  Surgeon: [name], MD;  Location: [facility];  Service: Endoscopy;;  colon    Family History: Family History  Problem Relation Age of Onset  . Arthritis Mother   . Hernia Mother   . Constipation Mother   . Colon cancer Neg Hx     Social History: Social History   Socioeconomic History  . Marital status: Divorced    Spouse name: Not on file  . Number of children: Not on file  . Years of education: Not on file  . Highest education level: Not on file  Occupational History  . Not on file  Tobacco Use  . Smoking status: Current Every Day Smoker    Packs/day: 0.50    Years: 37.00    Pack years: 18.50    Types: Cigarettes  . Smokeless tobacco: Never Used  Substance and Sexual Activity  . Alcohol use: No  . Drug use: No  . Sexual activity: Yes    Birth control/protection: None  Other Topics Concern  . Not on file  Social History Narrative  . Not on file   Social Determinants of Health    Financial Resource Strain: Unknown  . Difficulty of Paying Living Expenses: Patient refused  Food Insecurity: Unknown  . Worried About Charity fundraiser in the Last Year: Patient refused  . Ran Out of Food in the Last Year: Patient refused  Transportation Needs: Unknown  . Lack of Transportation (Medical): Patient refused  . Lack of Transportation (Non-Medical): Patient refused  Physical Activity: Unknown  . Days of Exercise per Week: Patient refused  . Minutes of Exercise per Session: Patient refused  Stress: Unknown  . Feeling of Stress : Patient refused  Social Connections: Unknown  . Frequency of Communication with Friends and Family: Patient refused  . Frequency of Social Gatherings with Friends and Family: Patient refused  .  Attends Religious Services: Patient refused  . Active Member of Clubs or Organizations: Patient refused  . Attends Archivist Meetings: Patient refused  . Marital Status: Patient refused    Allergies:  No Known Allergies  Objective:    Vital Signs:   Temp:  [97.6 F (36.4 C)-98.4 F (36.9 C)] 97.6 F (36.4 C) (01/25 0409) Pulse Rate:  [69-77] 70 (01/25 0409) Resp:  [16-20] 18 (01/25 0409) BP: (96-118)/(59-76) 100/66 (01/25 0409) SpO2:  [90 %-95 %] 90 % (01/25 0409) Weight:  [86.3 kg] 86.3 kg (01/25 0500) Last BM Date: 03/23/19  Weight change: Filed Weights   03/21/19 0500 03/22/19 0451 03/23/19 0500  Weight: 90.2 kg 88.6 kg 86.3 kg    Intake/Output:   Intake/Output Summary (Last 24 hours) at 03/23/2019 0917 Last data filed at 03/23/2019 0426 Gross per 24 hour  Intake 240 ml  Output 4000 ml  Net -3760 ml      Physical Exam    General:  Well appearing. No resp difficulty HEENT: normal Neck: supple. JVP . Carotids 2+ bilat; no bruits. No lymphadenopathy or thyromegaly appreciated. Cor: PMI nondisplaced. Regular rate & rhythm. No rubs, gallops or murmurs. Lungs: clear Abdomen: soft, nontender, nondistended. No  hepatosplenomegaly. No bruits or masses. Good bowel sounds. Extremities: no cyanosis, clubbing, rash, edema Neuro: alert & orientedx3, cranial nerves grossly intact. moves all 4 extremities w/o difficulty. Affect pleasant   Telemetry   NSR, personally reviewed  EKG    NSR, IVCD, LVH, left axis deviation (personally reviewed)  Labs   Basic Metabolic Panel: Recent Labs  Lab 03/20/19 1658 03/20/19 1658 03/21/19 0616 03/22/19 0443 03/23/19 0608  NA 140  --  140 140 138  K 3.1*  --  3.3* 3.3* 3.3*  CL 101  --  99 97* 93*  CO2 29  --  31 33* 32  GLUCOSE 203*  --  162* 171* 158*  BUN 17  --  15 21* 31*  CREATININE 0.91  --  0.93 0.96 1.25*  CALCIUM 8.9   < > 9.0 9.4 9.5   < > = values in this interval not displayed.    Liver Function Tests: Recent Labs  Lab 03/20/19 1658  AST 17  ALT 23  ALKPHOS 104  BILITOT 0.7  PROT 6.7  ALBUMIN 3.5   No results for input(s): LIPASE, AMYLASE in the last 168 hours. No results for input(s): AMMONIA in the last 168 hours.  CBC: Recent Labs  Lab 03/20/19 1658  WBC 8.5  NEUTROABS 6.6  HGB 12.5  HCT 39.8  MCV 83.4  PLT 267    Cardiac Enzymes: No results for input(s): CKTOTAL, CKMB, CKMBINDEX, TROPONINI in the last 168 hours.  BNP: BNP (last 3 results) Recent Labs    03/20/19 1658  BNP 464.0*    ProBNP (last 3 results) No results for input(s): PROBNP in the last 8760 hours.   CBG: Recent Labs  Lab 03/22/19 0720 03/22/19 1118 03/22/19 1633 03/22/19 2116 03/23/19 0741  GLUCAP 152* 127* 139* 195* 134*    Coagulation Studies: No results for input(s): LABPROT, INR in the last 72 hours.   Imaging    No results found.   Medications:     Current Medications: . ALPRAZolam  1 mg Oral QHS  . atorvastatin  40 mg Oral q1800  . carvedilol  6.25 mg Oral BID WC  . enoxaparin (LOVENOX) injection  40 mg Subcutaneous Q24H  . FLUoxetine  20 mg Oral q morning - 10a  .  furosemide  40 mg Oral Daily  . glipiZIDE   5 mg Oral BID AC  . insulin aspart  0-15 Units Subcutaneous TID WC  . insulin aspart  0-5 Units Subcutaneous QHS  . lisinopril  5 mg Oral q morning - 10a  . LORazepam  0.5 mg Oral TID  . Melatonin  3 mg Oral QHS  . nicotine  21 mg Transdermal Daily  . pantoprazole  40 mg Oral Daily  . potassium chloride  20 mEq Oral BID  . sodium chloride flush  3 mL Intravenous Q12H     Infusions: . sodium chloride         Assessment/Plan   1. Acute systolic CHF: Echo this admission with EF 20-25%, mild RV dysfunction.  No prior cardiac history but father has history of nonischemic cardiomyopathy with heart transplant.  LHC/RHC was done today, no significant coronary disease and optimized filling pressures with preserved cardiac output.  She was initially diuresed, now euvolemic by exam and RHC.  - Continue Coreg.  - Last dose of lisinopril was yesterday, I will start her on losartan 12.5 mg daily.  Eventually would like to transition her to [facility] but SBP 90s-100s so will move carefully.  - Start spironolactone tomorrow if creatinine stable.  - I will arrange for cardiac MRI to look for infiltrative disease or evidence for prior myocarditis.  - Hold Lasix today with low filling pressures, probably start back on po tomorrow.  - Possibly home tomorrow, will need CHF clinic followup.  2. Smoking: Strongly urged to quit.  3. HTN: BP on the lower side currently.  4. Type 2 diabetes: She was on pioglitazone at home, would stop this.   - Would consider replacement of glipizide with metformin eventually.  - Would start dapagliflozin eventually.   Length of Stay: 3  Loralie Champagne, MD  03/23/2019, 9:17 AM  Advanced Heart Failure Team Pager [phone] (M-F; 7a - 4p)  Please contact [facility] for night-coverage after hours (4p -7a ) and weekends on [email or web address]

## 2019-03-23 NOTE — Interval H&P Note (Signed)
History and Physical Interval Note:  03/23/2019 1:02 PM  [name]  has presented today for surgery, with the diagnosis of chf.  The various methods of treatment have been discussed with the patient and family. After consideration of risks, benefits and other options for treatment, the patient has consented to  Procedure(s): RIGHT/LEFT HEART CATH AND CORONARY ANGIOGRAPHY (N/A) as a surgical intervention.  The patient's history has been reviewed, patient examined, no change in status, stable for surgery.  I have reviewed the patient's chart and labs.  Questions were answered to the patient's satisfaction.     Shiron Whetsel [employer]

## 2019-03-23 NOTE — H&P (View-Only) (Signed)
Cardiology Consultation:   Patient ID: [name] MRN: [id number]; DOB: February 02, 1961  Admit date: 03/20/2019 Date of Consult: 03/23/2019  Primary Care Provider: [name], MD Primary Cardiologist: Rozann Lesches, MD  (New) Primary Electrophysiologist:  None     Patient Profile:   [name] is a 59 y.o. female with a hx of HTN, DM who is being seen today for the evaluation of new CHF at the request of Dr. Dyann Kief.  History of Present Illness:   [name]  Is a 59 yo female with history of HTN, DM, HLD tobacco abuse admitted with new CHF-O2 sats 88%, cardiomegaly, BNP 464. Echo 03/21/19 LVEF 20-25% mild LVH, grade 3 DD, global HK, mod MR.  Patient says she was doing well until 1 1/2 weeks ago when she had progressive shortness of breath that worsened and had some associated chest tightness. Smokes 1/2 ppd and was trying to use inhalers without relief. She got short of breath going to the bathroom this am. Her father had heart failure in his 18's, defibrillator and eventually heart transplant in [place]. He died of cancer/infection. No recent infection.  Heart Pathway Score:     Past Medical History:  Diagnosis Date  . Anal fissure   . Anxiety   . Depression   . GERD (gastroesophageal reflux disease)   . History of seizures as a child    None since age 85  . Hypercholesteremia   . Hypertension   . Type 2 diabetes mellitus ([place])     Past Surgical History:  Procedure Laterality Date  . BACK SURGERY  2005   lumbar disckectomy  . COLONOSCOPY  10/01/2011   Procedure: COLONOSCOPY;  Surgeon: [name], MD;  [facility];  Service: Endoscopy;  Laterality: N/A;  730  . COLONOSCOPY N/A 10/04/2016   Procedure: COLONOSCOPY;  Surgeon: [name], MD;  [facility];  Service: Endoscopy;  Laterality: N/A;  10:30  . DILATION AND CURETTAGE OF UTERUS    . FOOT BONE EXCISION     right  . POLYPECTOMY  10/04/2016   Procedure: POLYPECTOMY;  Surgeon: [name], MD;  [facility]: [facility];  Service: Endoscopy;;  colon     Home Medications:  Prior to Admission medications   Medication Sig Start Date End Date Taking? Authorizing Provider  albuterol (VENTOLIN HFA) 108 (90 Base) MCG/ACT inhaler Inhale 2 puffs into the lungs every 6 (six) hours as needed for wheezing or shortness of breath.   Yes [provider]  ALPRAZolam Duanne Moron) 1 MG tablet Take 1 mg by mouth at bedtime.   Yes [provider]  atorvastatin (LIPITOR) 40 MG tablet Take 40 mg by mouth daily.   Yes [provider]  azithromycin (ZITHROMAX) 250 MG tablet Take 250-500 mg by mouth See admin instructions. Prescribed and filled on 03/20/2019   Yes [provider]  Cholecalciferol (VITAMIN D3) 125 MCG (5000 UT) CAPS Take 1 capsule by mouth daily.   Yes [provider]  dicyclomine (BENTYL) 10 MG capsule TAKE ONE CAPSULE BY MOUTH TWICE A DAY BEFORE A MEAL Patient taking differently: Take 10 mg by mouth 3 (three) times daily before meals.  09/30/17  Yes Setzer, Terri L, NP  FLUoxetine (PROZAC) 20 MG capsule Take 20 mg by mouth every morning.    Yes [provider]  fluticasone (FLONASE) 50 MCG/ACT nasal spray Place 2 sprays into both nostrils daily as needed for allergies or rhinitis.  Yes [provider]  glipiZIDE (GLUCOTROL) 5 MG tablet Take 5 mg by mouth 2 (two) times daily before a meal.    Yes [provider]  ibuprofen (ADVIL) 200 MG tablet Take 200 mg by mouth every 6 (six) hours as needed for mild pain or moderate pain.   Yes [provider]  lisinopril (PRINIVIL,ZESTRIL) 5 MG tablet Take 5 mg by mouth every morning.    Yes [provider]  LORazepam (ATIVAN) 0.5 MG tablet Take 0.5 mg by mouth 3 (three) times daily.    Yes [provider]  Melatonin (SM MELATONIN) 3 MG TABS Take 3 mg by mouth at bedtime.   Yes [provider]  pantoprazole (PROTONIX) 40 MG tablet Take 1  tablet (40 mg total) by mouth daily. 05/19/13  Yes Black, Lezlie Octave, NP  pioglitazone (ACTOS) 45 MG tablet Take 45 mg by mouth daily.    Yes [provider]  predniSONE (DELTASONE) 10 MG tablet Take 10 mg by mouth daily with breakfast. [id number]   Yes [provider]    Inpatient Medications: Scheduled Meds: . ALPRAZolam  1 mg Oral QHS  . atorvastatin  40 mg Oral q1800  . carvedilol  6.25 mg Oral BID WC  . enoxaparin (LOVENOX) injection  40 mg Subcutaneous Q24H  . FLUoxetine  20 mg Oral q morning - 10a  . furosemide  40 mg Oral Daily  . glipiZIDE  5 mg Oral BID AC  . insulin aspart  0-15 Units Subcutaneous TID WC  . insulin aspart  0-5 Units Subcutaneous QHS  . lisinopril  5 mg Oral q morning - 10a  . LORazepam  0.5 mg Oral TID  . Melatonin  3 mg Oral QHS  . nicotine  21 mg Transdermal Daily  . pantoprazole  40 mg Oral Daily  . potassium chloride  20 mEq Oral BID  . sodium chloride flush  3 mL Intravenous Q12H   Continuous Infusions: . sodium chloride     PRN Meds: sodium chloride, acetaminophen, dicyclomine, ondansetron (ZOFRAN) IV, sodium chloride flush  Allergies:   No Known Allergies  Social History:   Social History   Socioeconomic History  . Marital status: Divorced    Spouse name: Not on file  . Number of children: Not on file  . Years of education: Not on file  . Highest education level: Not on file  Occupational History  . Not on file  Tobacco Use  . Smoking status: Current Every Day Smoker    Packs/day: 0.50    Years: 37.00    Pack years: 18.50    Types: Cigarettes  . Smokeless tobacco: Never Used  Substance and Sexual Activity  . Alcohol use: No  . Drug use: No  . Sexual activity: Yes    Birth control/protection: None  Other Topics Concern  . Not on file  Social History Narrative  . Not on file   Social Determinants of Health   Financial Resource Strain: Unknown  . Difficulty of Paying Living Expenses: Patient refused   Food Insecurity: Unknown  . Worried About Charity fundraiser in the Last Year: Patient refused  . Ran Out of Food in the Last Year: Patient refused  Transportation Needs: Unknown  . Lack of Transportation (Medical): Patient refused  . Lack of Transportation (Non-Medical): Patient refused  Physical Activity: Unknown  . Days of Exercise per Week: Patient refused  . Minutes of Exercise per Session: Patient refused  Stress: Unknown  .  Feeling of Stress : Patient refused  Social Connections: Unknown  . Frequency of Communication with Friends and Family: Patient refused  . Frequency of Social Gatherings with Friends and Family: Patient refused  . Attends Religious Services: Patient refused  . Active Member of Clubs or Organizations: Patient refused  . Attends Archivist Meetings: Patient refused  . Marital Status: Patient refused  Intimate Partner Violence: Unknown  . Fear of Current or Ex-Partner: Patient refused  . Emotionally Abused: Patient refused  . Physically Abused: Patient refused  . Sexually Abused: Patient refused    Family History:     Family History  Problem Relation Age of Onset  . Arthritis Mother   . Hernia Mother   . Constipation Mother   . Colon cancer Neg Hx      ROS:  Please see the history of present illness.  Review of Systems  Constitution: Positive for weight gain.  HENT: Negative.   Eyes: Negative.   Cardiovascular: Positive for chest pain and leg swelling.  Respiratory: Positive for shortness of breath and wheezing.   Hematologic/Lymphatic: Negative.   Musculoskeletal: Negative.  Negative for joint pain.  Gastrointestinal: Negative.   Genitourinary: Negative.   Neurological: Negative.    All other ROS reviewed and negative.     Physical Exam/Data:   Vitals:   03/22/19 2114 03/22/19 2340 03/23/19 0409 03/23/19 0500  BP: 116/73 (!) 96/59 100/66   Pulse: 74 69 70   Resp: 17 20 18    Temp: 98.4 F (36.9 C) 97.9 F (36.6 C) 97.6 F  (36.4 C)   TempSrc: Oral Oral Oral   SpO2: 93% 92% 90%   Weight:    86.3 kg  Height:        Intake/Output Summary (Last 24 hours) at 03/23/2019 0927 Last data filed at 03/23/2019 0426 Gross per 24 hour  Intake 240 ml  Output 4000 ml  Net -3760 ml   Last 3 Weights 03/23/2019 03/22/2019 03/21/2019  Weight (lbs) 190 lb 3.2 oz 195 lb 6.4 oz 198 lb 13.7 oz  Weight (kg) 86.274 kg 88.633 kg 90.2 kg     Body mass index is 29.79 kg/m.  General:  Well nourished, well developed, in no acute distress  HEENT: normal Lymph: no adenopathy Neck: no JVD Endocrine:  No thryomegaly Vascular: No carotid bruits; FA pulses 2+ bilaterally without bruits  Cardiac:  normal S1, S2; RRR; 1/6 systolic murmur apex Lungs:  Decreased breath sounds with wheezing throughout Abd: soft, nontender, no hepatomegaly  Ext: no edema Musculoskeletal:  No deformities, BUE and BLE strength normal and equal Skin: warm and dry  Neuro:  CNs 2-12 intact, no focal abnormalities noted Psych:  Normal affect   EKG:  The EKG was personally reviewed and demonstrates:  NSR with poor R wave progression ant similar to 2015 but new ST  T wave changes laterally. Telemetry:  Telemetry was personally reviewed and demonstrates:  NSR with 6 beat wide complex at 90/m  Relevant CV Studies: 2Decho 03/21/19 IMPRESSIONS      1. Left ventricular ejection fraction, by visual estimation, is 20 to 25%. The left ventricle has severely decreased function. There is mildly increased left ventricular hypertrophy.  2. Elevated left ventricular end-diastolic pressure.  3. Left ventricular diastolic parameters are consistent with Grade III diastolic dysfunction (restrictive).  4. The left ventricle demonstrates global hypokinesis.  5. Global right ventricle has mildly reduced systolic function.The right ventricular size is normal. No increase in right ventricular wall thickness.  6. Left atrial size was normal.  7. Right atrial size was normal.  8.  The mitral valve is grossly normal. Moderate mitral valve regurgitation.  9. The tricuspid valve is grossly normal. 10. The tricuspid valve is grossly normal. Tricuspid valve regurgitation is trivial. 11. The aortic valve is tricuspid. Aortic valve regurgitation is not visualized. No evidence of aortic valve sclerosis or stenosis. 12. Pulmonic regurgitation is mild. 13. The pulmonic valve was grossly normal. Pulmonic valve regurgitation is mild. 14. The inferior vena cava is normal in size with greater than 50% respiratory variability, suggesting right atrial pressure of 3 mmHg.   FINDINGS  Left Ventricle: Left ventricular ejection fraction, by visual estimation, is 20 to 25%. The left ventricle has severely decreased function. The left ventricle demonstrates global hypokinesis. The left ventricular internal cavity size was the left  ventricle is normal in size. There is mildly increased left ventricular hypertrophy. Concentric left ventricular hypertrophy. Left ventricular diastolic parameters are consistent with Grade III diastolic dysfunction (restrictive). Elevated left  ventricular end-diastolic pressure.   Right Ventricle: The right ventricular size is normal. No increase in right ventricular wall thickness. Global RV systolic function is has mildly reduced systolic function.   Left Atrium: Left atrial size was normal in size.   Right Atrium: Right atrial size was normal in size   Pericardium: There is no evidence of pericardial effusion.   Mitral Valve: The mitral valve is grossly normal. Moderate mitral valve regurgitation.   Tricuspid Valve: The tricuspid valve is grossly normal. Tricuspid valve regurgitation is trivial.   Aortic Valve: The aortic valve is tricuspid. Aortic valve regurgitation is not visualized. The aortic valve is structurally normal, with no evidence of sclerosis or stenosis.   Pulmonic Valve: The pulmonic valve was grossly normal. Pulmonic valve regurgitation  is mild. Pulmonic regurgitation is mild.   Aorta: The aortic root is normal in size and structure.   Venous: The inferior vena cava is normal in size with greater than 50% respiratory variability, suggesting right atrial pressure of 3 mmHg.   IAS/Shunts: No atrial level shunt detected by color flow Doppler.   Laboratory Data:  High Sensitivity Troponin:   Recent Labs  Lab 03/20/19 1658 03/20/19 1840  TROPONINIHS 27* 33*     Chemistry Recent Labs  Lab 03/21/19 0616 03/22/19 0443 03/23/19 0608  NA 140 140 138  K 3.3* 3.3* 3.3*  CL 99 97* 93*  CO2 31 33* 32  GLUCOSE 162* 171* 158*  BUN 15 21* 31*  CREATININE 0.93 0.96 1.25*  CALCIUM 9.0 9.4 9.5  GFRNONAA >60 >60 48*  GFRAA >60 >60 55*  ANIONGAP 10 10 13     Recent Labs  Lab 03/20/19 1658  PROT 6.7  ALBUMIN 3.5  AST 17  ALT 23  ALKPHOS 104  BILITOT 0.7   Hematology Recent Labs  Lab 03/20/19 1658  WBC 8.5  RBC 4.77  HGB 12.5  HCT 39.8  MCV 83.4  MCH 26.2  MCHC 31.4  RDW 14.8  PLT 267   BNP Recent Labs  Lab 03/20/19 1658  BNP 464.0*    DDimer  Recent Labs  Lab 03/20/19 1658  DDIMER 0.90*     Radiology/Studies:  DG Chest Portable 1 View  Result Date: 03/20/2019 CLINICAL DATA:  59 year old female with shortness of breath. EXAM: PORTABLE CHEST 1 VIEW COMPARISON:  None. FINDINGS: There is cardiomegaly with vascular congestion and mild edema. Small bilateral pleural effusions may be present. There is no pneumothorax. No acute osseous  pathology. IMPRESSION: Cardiomegaly with findings of CHF. Superimposed pneumonia is not excluded. Clinical correlation is recommended. Electronically Signed   By: Anner Crete M.D.   On: 03/20/2019 17:46   ECHOCARDIOGRAM COMPLETE  Result Date: 03/21/2019   ECHOCARDIOGRAM REPORT   Patient Name:   ARIELY NOSTRAND Date of Exam: 03/21/2019 Medical Rec #:  [id number]    Height:       67.0 in Accession #:    [id number]   Weight:       198.9 lb Date of Birth:  19-Dec-1960     BSA:          2.02 m Patient Age:    31 years     BP:           124/65 mmHg Patient Gender: F            HR:           81 bpm. Exam Location:  [facility] Procedure: 2D Echo, Cardiac Doppler and Color Doppler Indications:    Acute CHF (congestive heart failure)  History:        Patient has no prior history of Echocardiogram examinations.                 CHF; Risk Factors:Current Smoker, Diabetes, Hypertension and                 Dyslipidemia.  Sonographer:    Alvino Chapel RCS Referring Phys: [place]  1. Left ventricular ejection fraction, by visual estimation, is 20 to 25%. The left ventricle has severely decreased function. There is mildly increased left ventricular hypertrophy.  2. Elevated left ventricular end-diastolic pressure.  3. Left ventricular diastolic parameters are consistent with Grade III diastolic dysfunction (restrictive).  4. The left ventricle demonstrates global hypokinesis.  5. Global right ventricle has mildly reduced systolic function.The right ventricular size is normal. No increase in right ventricular wall thickness.  6. Left atrial size was normal.  7. Right atrial size was normal.  8. The mitral valve is grossly normal. Moderate mitral valve regurgitation.  9. The tricuspid valve is grossly normal. 10. The tricuspid valve is grossly normal. Tricuspid valve regurgitation is trivial. 11. The aortic valve is tricuspid. Aortic valve regurgitation is not visualized. No evidence of aortic valve sclerosis or stenosis. 12. Pulmonic regurgitation is mild. 13. The pulmonic valve was grossly normal. Pulmonic valve regurgitation is mild. 14. The inferior vena cava is normal in size with greater than 50% respiratory variability, suggesting right atrial pressure of 3 mmHg. FINDINGS  Left Ventricle: Left ventricular ejection fraction, by visual estimation, is 20 to 25%. The left ventricle has severely decreased function. The left ventricle demonstrates global hypokinesis.  The left ventricular internal cavity size was the left ventricle is normal in size. There is mildly increased left ventricular hypertrophy. Concentric left ventricular hypertrophy. Left ventricular diastolic parameters are consistent with Grade III diastolic dysfunction (restrictive). Elevated left ventricular end-diastolic pressure. Right Ventricle: The right ventricular size is normal. No increase in right ventricular wall thickness. Global RV systolic function is has mildly reduced systolic function. Left Atrium: Left atrial size was normal in size. Right Atrium: Right atrial size was normal in size Pericardium: There is no evidence of pericardial effusion. Mitral Valve: The mitral valve is grossly normal. Moderate mitral valve regurgitation. Tricuspid Valve: The tricuspid valve is grossly normal. Tricuspid valve regurgitation is trivial. Aortic Valve: The aortic valve is tricuspid. Aortic valve regurgitation is not visualized. The aortic  valve is structurally normal, with no evidence of sclerosis or stenosis. Pulmonic Valve: The pulmonic valve was grossly normal. Pulmonic valve regurgitation is mild. Pulmonic regurgitation is mild. Aorta: The aortic root is normal in size and structure. Venous: The inferior vena cava is normal in size with greater than 50% respiratory variability, suggesting right atrial pressure of 3 mmHg. IAS/Shunts: No atrial level shunt detected by color flow Doppler.  LEFT VENTRICLE PLAX 2D LVIDd:         5.07 cm       Diastology LVIDs:         4.75 cm       LV e' lateral:   7.07 cm/s LV PW:         1.12 cm       LV E/e' lateral: 17.4 LV IVS:        1.11 cm       LV e' medial:    5.77 cm/s LVOT diam:     1.80 cm       LV E/e' medial:  21.3 LV SV:         17 ml LV SV Index:   8.22 LVOT Area:     2.54 cm  LV Volumes (MOD) LV area d, A2C:    34.70 cm LV area d, A4C:    36.10 cm LV area s, A2C:    29.60 cm LV area s, A4C:    30.10 cm LV major d, A2C:   7.80 cm LV major d, A4C:   8.44 cm LV  major s, A2C:   7.83 cm LV major s, A4C:   8.18 cm LV vol d, MOD A2C: 130.0 ml LV vol d, MOD A4C: 124.0 ml LV vol s, MOD A2C: 96.5 ml LV vol s, MOD A4C: 91.5 ml LV SV MOD A2C:     33.5 ml LV SV MOD A4C:     124.0 ml LV SV MOD BP:      35.9 ml RIGHT VENTRICLE RV S prime:     12.80 cm/s TAPSE (M-mode): 1.9 cm LEFT ATRIUM           Index       RIGHT ATRIUM           Index LA diam:      4.30 cm 2.13 cm/m  RA Area:     11.80 cm LA Vol (A2C): 67.4 ml 33.41 ml/m RA Volume:   24.60 ml  12.19 ml/m LA Vol (A4C): 60.8 ml 30.14 ml/m  AORTIC VALVE LVOT Vmax:   83.50 cm/s LVOT Vmean:  49.800 cm/s LVOT VTI:    0.122 m  AORTA Ao Root diam: 3.00 cm MITRAL VALVE MV Area (PHT): 6.32 cm              SHUNTS MV PHT:        34.80 msec            Systemic VTI:  0.12 m MV Decel Time: 120 msec              Systemic Diam: 1.80 cm MR Peak grad:    92.5 mmHg MR Mean grad:    55.0 mmHg MR Vmax:         481.00 cm/s MR Vmean:        339.0 cm/s MR PISA:         1.01 cm MR PISA Eff ROA: 7 mm MR PISA Radius:  0.40 cm MV E velocity: 123.00 cm/s 103 cm/s MV  A velocity: 56.30 cm/s  70.3 cm/s MV E/A ratio:  2.18        1.5  Kate Sable MD Electronically signed by Kate Sable MD Signature Date/Time: 03/21/2019/12:49:03 PM    Final        HEAR Score (for undifferentiated chest pain):       Assessment and Plan:   1. Acute systolic and diastolic CHF with new LVD EF 20-25% with global HK, grade 3 DD, mod MR, some RV dysfunction-has diuresed 3.7 L since admission on Lasix 40 mg IV BID. Well compensated and Crt going up. Would decrease lasix to 40 mg po. Still short of breath and wheezing but probably due to COPD. Some chest tightness on admission but none before or since. Need to rule out ischemic heart disease. Favor L/R heart cath. Would benefit from advanced CHF clinic post hospitalization. 2. Hypokalemia-needs replaced 3. HTN running low.on coreg 6.25 mg bid, lisinopril 5 mg  4. HLD on lipitor 5. DM not well controlled per  patient. No A1C in chart 6. Tobacco abuse needs to quit      For questions or updates, please contact [place] Please consult [email or web address] for contact info under     Signed, Ermalinda Barrios, PA-C 03/23/2019 9:27 AM     Attending note:  Patient seen and examined.  Case discussed with [name].  [name] presents with a history of hypertension, type 2 diabetes mellitus, hyperlipidemia, longstanding tobacco abuse, and family history of nonischemic cardiomyopathy in her father who underwent cardiac transplantation at [facility] several years ago.  No other known family members with cardiomyopathy.  She presents with a 2-week history of worsening dyspnea on exertion and fatigue, vague sense of chest tightness, no palpitations or syncope.  She was noted to be hypoxic with elevated BNP and chest x-ray showing cardiomegaly with vascular congestion and mild pulmonary edema.  High-sensitivity troponin I levels were minimally abnormal in the 20s to 30s and in flat pattern not suggestive of ACS.  SARS coronavirus 2 test negative.  D-dimer 0.90, but pulmonary embolus not suspected based on presentation.  On examination this morning she is in no distress.  Not short of breath at rest, but does have symptoms with moving around in her room.  Also with mild expiratory wheezes.  She is afebrile, systolic blood pressure mid 90s to 115, heart rate in the 70s in sinus rhythm by telemetry which I personally reviewed.  She had a burst of NSVT overnight that was asymptomatic.  Mild expiratory wheezes on examination of the lungs.  Cardiac exam reveals indistinct PMI with RRR no gallop.  She has no pitting edema.  Lab work shows potassium 3.3, BUN 31, creatinine 1.25, hemoglobin 12.5, platelets 267, hemoglobin A1c 7.8%.  I personally reviewed her ECG from 03/20/2019 which shows sinus rhythm with IVCD and repolarization abnormalities.  Echocardiogram performed during this admission shows LVEF 20 to 25% with  mild LVH, increased LVEDP, restrictive diastolic filling pattern, global hypokinesis, mild RV dysfunction, and moderate mitral regurgitation.  Patient presents with acute combined heart failure, symptom onset within the last 2 weeks.  Cardiac risk factors include type 2 diabetes mellitus, hypertension, hyperlipidemia, and longstanding tobacco use.  No clear evidence of ACS however by high-sensitivity troponin I levels.  She states that her father had a nonischemic cardiomyopathy and ultimately underwent cardiac transplantation [facility] several years ago, but no other obvious family history of cardiomyopathy.  After discussion of the risks and benefits, plan is  to have her transferred to our cardiology service at [facility] in anticipation of a right and left heart catheterization and then further medication adjustments with input from the heart failure team.  We are stopping lisinopril for now.  Diuretics also cut back with recent bump in creatinine.  Satira Sark, M.D., F.A.C.C.

## 2019-03-23 NOTE — Plan of Care (Signed)
Pt alert and oriented able to express needs. Pain in stomach (cramping) x 2 loose stools this AM. Appetite adequate.Independent with all needs. Able to turn self in bed to prevent any pressure injuries. Cont of bowel bladder moisture managed and no concern for skin integrity. Able to appropriately use call bell remains free of fall/injury. MD in this AM to discuss plan of acre pt to transfer to [facility] as soon as bed available for further treatment. Will continue to monitor

## 2019-03-24 ENCOUNTER — Inpatient Hospital Stay (HOSPITAL_COMMUNITY): Payer: Medicaid Other

## 2019-03-24 DIAGNOSIS — I5021 Acute systolic (congestive) heart failure: Secondary | ICD-10-CM

## 2019-03-24 LAB — CBC WITH DIFFERENTIAL/PLATELET
Abs Immature Granulocytes: 0.02 10*3/uL (ref 0.00–0.07)
Basophils Absolute: 0.1 10*3/uL (ref 0.0–0.1)
Basophils Relative: 1 %
Eosinophils Absolute: 0.2 10*3/uL (ref 0.0–0.5)
Eosinophils Relative: 3 %
HCT: 38.8 % (ref 36.0–46.0)
Hemoglobin: 12.4 g/dL (ref 12.0–15.0)
Immature Granulocytes: 0 %
Lymphocytes Relative: 28 %
Lymphs Abs: 2.1 10*3/uL (ref 0.7–4.0)
MCH: 25.8 pg — ABNORMAL LOW (ref 26.0–34.0)
MCHC: 32 g/dL (ref 30.0–36.0)
MCV: 80.8 fL (ref 80.0–100.0)
Monocytes Absolute: 0.7 10*3/uL (ref 0.1–1.0)
Monocytes Relative: 10 %
Neutro Abs: 4.4 10*3/uL (ref 1.7–7.7)
Neutrophils Relative %: 58 %
Platelets: 260 10*3/uL (ref 150–400)
RBC: 4.8 MIL/uL (ref 3.87–5.11)
RDW: 14.5 % (ref 11.5–15.5)
WBC: 7.4 10*3/uL (ref 4.0–10.5)
nRBC: 0 % (ref 0.0–0.2)

## 2019-03-24 LAB — BASIC METABOLIC PANEL
Anion gap: 10 (ref 5–15)
BUN: 30 mg/dL — ABNORMAL HIGH (ref 6–20)
CO2: 27 mmol/L (ref 22–32)
Calcium: 9.3 mg/dL (ref 8.9–10.3)
Chloride: 101 mmol/L (ref 98–111)
Creatinine, Ser: 1.22 mg/dL — ABNORMAL HIGH (ref 0.44–1.00)
GFR calc Af Amer: 57 mL/min — ABNORMAL LOW (ref >60)
GFR calc non Af Amer: 49 mL/min — ABNORMAL LOW (ref >60)
Glucose, Bld: 160 mg/dL — ABNORMAL HIGH (ref 70–99)
Potassium: 4.3 mmol/L (ref 3.5–5.1)
Sodium: 138 mmol/L (ref 135–145)

## 2019-03-24 LAB — GLUCOSE, CAPILLARY
Glucose-Capillary: 148 mg/dL — ABNORMAL HIGH (ref 70–99)
Glucose-Capillary: 185 mg/dL — ABNORMAL HIGH (ref 70–99)

## 2019-03-24 MED ORDER — FUROSEMIDE 20 MG PO TABS: 20.0000 mg | ORAL_TABLET | Freq: Every day | ORAL | 2 refills | Status: DC

## 2019-03-24 MED ORDER — CARVEDILOL 6.25 MG PO TABS: 6.2500 mg | ORAL_TABLET | Freq: Two times a day (BID) | ORAL | 2 refills | Status: DC

## 2019-03-24 MED ORDER — GADOBUTROL 1 MMOL/ML IV SOLN
9.0000 mL | Freq: Once | INTRAVENOUS | Status: AC | PRN
  Administered 2019-03-24: 9 mL via INTRAVENOUS

## 2019-03-24 MED ORDER — SPIRONOLACTONE 12.5 MG HALF TABLET
12.5000 mg | ORAL_TABLET | Freq: Every day | ORAL | Status: DC
  Administered 2019-03-24: 12.5 mg via ORAL
  Filled 2019-03-24: qty 1

## 2019-03-24 MED ORDER — POTASSIUM CHLORIDE CRYS ER 20 MEQ PO TBCR: 20.0000 meq | EXTENDED_RELEASE_TABLET | Freq: Two times a day (BID) | ORAL | 2 refills | Status: DC

## 2019-03-24 MED ORDER — SPIRONOLACTONE 25 MG PO TABS: 12.5000 mg | ORAL_TABLET | Freq: Every day | ORAL | 2 refills | Status: DC

## 2019-03-24 MED ORDER — LOSARTAN POTASSIUM 25 MG PO TABS: 12.5000 mg | ORAL_TABLET | Freq: Every day | ORAL | 2 refills | Status: DC

## 2019-03-24 MED FILL — Heparin Sod (Porcine)-NaCl IV Soln 1000 Unit/500ML-0.9%: INTRAVENOUS | Qty: 1000 | Status: AC

## 2019-03-24 NOTE — Progress Notes (Addendum)
Advanced Heart Failure Rounding Note  PCP-Cardiologist: Rozann Lesches, MD   Subjective:    Yesterday had RHC/LHC that showed no significant coronary disease, normal filling pressures, and preserved cardiac output.   Denies SOB. Had a hard time sleeping.  She has a mild headache.    Objective:   Weight Range: 87.4 kg Body mass index is 30.17 kg/m.   Vital Signs:   Temp:  [97.5 F (36.4 C)-98 F (36.7 C)] 97.5 F (36.4 C) (01/26 0419) Pulse Rate:  [75-104] 75 (01/26 0419) Resp:  [11-23] 18 (01/26 0419) BP: (95-142)/(49-83) 108/63 (01/26 0419) SpO2:  [90 %-99 %] 92 % (01/26 0419) Weight:  [87.4 kg] 87.4 kg (01/26 0423) Last BM Date: 03/23/19  Weight change: Filed Weights   03/22/19 0451 03/23/19 0500 03/24/19 0423  Weight: 88.6 kg 86.3 kg 87.4 kg    Intake/Output:   Intake/Output Summary (Last 24 hours) at 03/24/2019 0715 Last data filed at 03/24/2019 0601 Gross per 24 hour  Intake 360 ml  Output 700 ml  Net -340 ml      Physical Exam    General:  Well appearing. No resp difficulty HEENT: Normal Neck: Supple. JVP 6-7. Carotids 2+ bilat; no bruits. No lymphadenopathy or thyromegaly appreciated. Cor: PMI nondisplaced. Regular rate & rhythm. No rubs, gallops or murmurs. Lungs: Clear Abdomen: Soft, nontender, nondistended. No hepatosplenomegaly. No bruits or masses. Good bowel sounds. Extremities: No cyanosis, clubbing, rash, edema Neuro: Alert & orientedx3, cranial nerves grossly intact. moves all 4 extremities w/o difficulty. Affect pleasant   Telemetry  NSR 60-70s    EKG    N/a   Labs    CBC Recent Labs    03/23/19 1541 03/24/19 0515  WBC 8.1 7.4  NEUTROABS  --  4.4  HGB 13.7 12.4  HCT 43.3 38.8  MCV 81.2 80.8  PLT 312 [id number]   Basic Metabolic Panel Recent Labs    03/23/19 0608 03/23/19 0608 03/23/19 1344 03/23/19 1541 03/24/19 0515  NA 138   < > 139  140  --  138  K 3.3*   < > 4.3  4.2  --  4.3  CL 93*  --   --   --  101    CO2 32  --   --   --  27  GLUCOSE 158*  --   --   --  160*  BUN 31*  --   --   --  30*  CREATININE 1.25*   < >  --  1.28* 1.22*  CALCIUM 9.5  --   --   --  9.3   < > = values in this interval not displayed.   Liver Function Tests No results for input(s): AST, ALT, ALKPHOS, BILITOT, PROT, ALBUMIN in the last 72 hours. No results for input(s): LIPASE, AMYLASE in the last 72 hours. Cardiac Enzymes No results for input(s): CKTOTAL, CKMB, CKMBINDEX, TROPONINI in the last 72 hours.  BNP: BNP (last 3 results) Recent Labs    03/20/19 1658  BNP 464.0*    ProBNP (last 3 results) No results for input(s): PROBNP in the last 8760 hours.   D-Dimer No results for input(s): DDIMER in the last 72 hours. Hemoglobin A1C No results for input(s): HGBA1C in the last 72 hours. Fasting Lipid Panel No results for input(s): CHOL, HDL, LDLCALC, TRIG, CHOLHDL, LDLDIRECT in the last 72 hours. Thyroid Function Tests No results for input(s): TSH, T4TOTAL, T3FREE, THYROIDAB in the last 72 hours.  Invalid input(s): FREET3  Other results:   Imaging    CARDIAC CATHETERIZATION  Result Date: 03/23/2019 1. Normal filling pressures. 2. Preserved cardiac output 3. No significant coronary disease. Nonischemic cardiomyopathy, now well-compensated.      Medications:     Scheduled Medications: . ALPRAZolam  1 mg Oral QHS  . atorvastatin  40 mg Oral q1800  . carvedilol  6.25 mg Oral BID WC  . enoxaparin (LOVENOX) injection  40 mg Subcutaneous Q24H  . FLUoxetine  20 mg Oral q morning - 10a  . glipiZIDE  5 mg Oral BID AC  . insulin aspart  0-15 Units Subcutaneous TID WC  . insulin aspart  0-5 Units Subcutaneous QHS  . LORazepam  0.5 mg Oral TID  . losartan  12.5 mg Oral Daily  . Melatonin  3 mg Oral QHS  . nicotine  21 mg Transdermal Daily  . pantoprazole  40 mg Oral Daily  . potassium chloride  20 mEq Oral BID  . sodium chloride flush  3 mL Intravenous Q12H  . sodium chloride flush  3 mL  Intravenous Q12H     Infusions: . sodium chloride    . sodium chloride       PRN Medications:  sodium chloride, sodium chloride, acetaminophen, dicyclomine, ondansetron (ZOFRAN) IV, sodium chloride flush, sodium chloride flush    Assessment/Plan   1. Acute systolic CHF: Echo this admission with EF 20-25%, mild RV dysfunction.  No prior cardiac history but father has history of nonischemic cardiomyopathy with heart transplant.  LHC/RHC was done today, no significant coronary disease and optimized filling pressures with preserved cardiac output.  She was initially diuresed, now euvolemic by exam and RHC.  - Continue Coreg 6.25 mg twice qa day.  - Last dose of lisinopril was 1/24, started losartan 12.5 mg daily on 1/25.  Eventually would like to transition her to [facility] but SBP 90s-100s so will move carefully.  - Add 12.5 mg spiro daily.  - Hold off on lasix.  - Renal function stable.   - CMRI set up to look for infiltrative disease or evidence for prior myocarditis.  - Provided with scale today.  2. Smoking: Strongly urged to quit.  3. HTN: BP on the lower side currently.  4. Type 2 diabetes: She was on pioglitazone at home, would stop this.   - Would consider replacement of glipizide with metformin eventually.  - Would start dapagliflozin eventually.   She wants to f/u with Dr Domenic Polite due to transportation issues. No money for a scale. She would like [facility] Pharmacy at discharge.   Length of Stay: 4  Amy Clegg, NP  03/24/2019, 7:15 AM  Advanced Heart Failure Team Pager [phone] (M-F; [place])  Please contact [facility] for night-coverage after hours (4p -7a ) and weekends on [email or web address]  Patient seen with NP, agree with the above note.   No dyspnea, has mild headache.  Tolerating current medication regimen.    General: NAD Neck: No JVD, no thyromegaly or thyroid nodule.  Lungs: Clear to auscultation bilaterally with normal respiratory effort. CV: Nondisplaced PMI.   Heart regular S1/S2, no S3/S4, no murmur.  No peripheral edema.   Abdomen: Soft, nontender, no hepatosplenomegaly, no distention.  Skin: Intact without lesions or rashes.  Neurologic: Alert and oriented x 3.  Psych: Normal affect. Extremities: No clubbing or cyanosis. 2+ right radial pulse.  HEENT: Normal.   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  I think that she  could go home today after her cardiac MRI is obtained.  Today, will start spironolactone, will not start Entresto yet with soft BP.  Can hold Lasix today, start 20 mg daily tomorrow.   Cardiac discharge plan: I talked to her about followup, she will see me once in CHF clinic where I will plan to send off [employer] gene testing to see if we can identify a gene triggering her cardiomyopathy (followup with me in 10-14 days, BMET in 1 week can be done in [place]).  Due to transportation issues, she will followup with Dr. Domenic Polite in [place] after that.  Cardiac meds for home: Coreg 6.25 mg bid, spironolactone 12.5 daily, losartan 12.5 daily, start Lasix 20 mg daily tomorrow, atorvastatin 40 daily.  Should start on dapagliflozin in the future and avoid pioglitazone.   Loralie Champagne 03/24/2019 8:44 AM

## 2019-03-24 NOTE — Progress Notes (Signed)
Pt discharge home.

## 2019-03-24 NOTE — Discharge Summary (Signed)
Advanced Heart Failure Team  Discharge Summary   Patient ID: [name] MRN: [id number], DOB/AGE: May 16, 1960 59 y.o. Admit date: 03/20/2019 D/C date:     03/24/2019   Primary Discharge Diagnoses:  Acute Systolic Heart Failure Nonischemic Cardiomyopathy   Secondary Discharge Diagnoses:  Essential Hypertension Type 2DM Tobacco Abuse  Hospital Course:   59 y/o female admitted for new systolic CHF.   Patient has history of DM, HTN, hyperlipidemia, and smoking.  She was admitted on 1/22 for about 1 week of shortness of breath. She had been started on azithromycin and prednisone by PCP and was taking inhalers for ?COPD exacerbation without relief.  COVID negative.  Dyspnea primarily exertional, no orthopnea. No chest pain reported.  In ER, pro-BNP was elevated and CXR suggestive of volume overload. Slightly elevated HS-TnI with no trend, not suggestive of ACS.  She was admitted for management of new diagnosis of CHF.  Echo was done, showed EF 20-25% with mild RV dysfunction. She was started on IV Lasix, now on po.  She was transferred from [facility] to [facility] for RHC/LHC to assess filling pressures/CO and also to assess for coronary disease as etiology.   Of note, her father developed CHF in his 68s, nonischemic cardiomyopathy.  He had ICD and later heart transplant.   Cath was done 1/25 and showed no significant coronary disease and optimized filling pressures with preserved cardiac output (see angiographic and hemodynamic data below). Diagnosed w/ NICM. cMRI was performed to assess for myocarditis (result pending). Dr. Aundra Dubin to interpret and will report results. She was started on GDMT w/ ARB,  blocker and MRA + loop diuretic. Actos for DM was discontinued w/ Plans to consider eventual SGLT2i in the future.   On 1/26, she was seen and examined by Dr. Aundra Dubin and felt stable for d/c home. She has f/u w/ Dr. Aundra Dubin on 2/4 and he will plan to send off [employer] gene testing to see if we can identify a gene  triggering her cardiomyopathy. Due to transportation issues, she will followup with Dr. Domenic Polite in [place] after that.    Cardiac meds for home: Coreg 6.25 mg bid, spironolactone 12.5 daily, losartan 12.5 daily, start Lasix 20 mg daily tomorrow, atorvastatin 40 daily.  Should start on dapagliflozin in the future and avoid pioglitazone.    Cardiac Studies 1. Left ventricular ejection fraction, by visual estimation, is 20 to 25%. The left ventricle has severely decreased function. There is mildly increased left ventricular hypertrophy. 2. Elevated left ventricular end-diastolic pressure. 3. Left ventricular diastolic parameters are consistent with Grade III diastolic dysfunction (restrictive). 4. The left ventricle demonstrates global hypokinesis. 5. Global right ventricle has mildly reduced systolic function.The right ventricular size is normal. No increase in right ventricular wall thickness. 6. Left atrial size was normal. 7. Right atrial size was normal. 8. The mitral valve is grossly normal. Moderate mitral valve regurgitation. 9. The tricuspid valve is grossly normal. 10. The tricuspid valve is grossly normal. Tricuspid valve regurgitation is trivial. 11. The aortic valve is tricuspid. Aortic valve regurgitation is not visualized. No evidence of aortic valve sclerosis or stenosis. 12. Pulmonic regurgitation is mild. 13. The pulmonic valve was grossly normal. Pulmonic valve regurgitation is mild. 14. The inferior vena cava is normal in size with greater than 50% respiratory variability, suggesting right atrial pressure of 3 mmHg.  LHC/RHC: Coronary Findings  Diagnostic Dominance: Right Left Main  Short, no significant disease.  Left Anterior Descending  25% ostial narrowing. Mild luminal irregularities.  Left Circumflex  No significant coronary disease.  Right Coronary Artery  No significant coronary disease.  Intervention  No interventions have been  documented. Right Heart  Right Heart Pressures RHC Procedural Findings: Hemodynamics (mmHg) RA mean 2 RV 27/4 PA 28/5, mean 18 PCWP mean 7 LV 112/15 AO 111/59  Oxygen saturations: PA 64% AO 97%  Cardiac Output (Fick) 4.69  Cardiac Index (Fick) 2.37   Cardiac MRI 03/23/18 - results pending     Discharge Weight Range: 192 lb  Discharge Vitals: Blood pressure 124/81, pulse 76, temperature 98.6 F (37 C), resp. rate 19, height 5\' 7"  (1.702 m), weight 87.4 kg, last menstrual period 09/25/2011, SpO2 95 %.  Labs: Lab Results  Component Value Date   WBC 7.4 03/24/2019   HGB 12.4 03/24/2019   HCT 38.8 03/24/2019   MCV 80.8 03/24/2019   PLT 260 03/24/2019    Recent Labs  Lab 03/20/19 1658 03/21/19 0616 03/24/19 0515  NA 140   < > 138  K 3.1*   < > 4.3  CL 101   < > 101  CO2 29   < > 27  BUN 17   < > 30*  CREATININE 0.91   < > 1.22*  CALCIUM 8.9   < > 9.3  PROT 6.7  --   --   BILITOT 0.7  --   --   ALKPHOS 104  --   --   ALT 23  --   --   AST 17  --   --   GLUCOSE 203*   < > 160*   < > = values in this interval not displayed.   No results found for: CHOL, HDL, LDLCALC, TRIG BNP (last 3 results) Recent Labs    03/20/19 1658  BNP 464.0*    ProBNP (last 3 results) No results for input(s): PROBNP in the last 8760 hours.   Diagnostic Studies/Procedures   CARDIAC CATHETERIZATION  Result Date: 03/23/2019 1. Normal filling pressures. 2. Preserved cardiac output 3. No significant coronary disease. Nonischemic cardiomyopathy, now well-compensated.    Discharge Medications   Allergies as of 03/24/2019   No Known Allergies     Medication List    STOP taking these medications   ibuprofen 200 MG tablet Commonly known as: ADVIL   lisinopril 5 MG tablet Commonly known as: ZESTRIL   pioglitazone 45 MG tablet Commonly known as: ACTOS     TAKE these medications   albuterol 108 (90 Base) MCG/ACT inhaler Commonly known as: VENTOLIN HFA Inhale 2 puffs  into the lungs every 6 (six) hours as needed for wheezing or shortness of breath.   ALPRAZolam 1 MG tablet Commonly known as: XANAX Take 1 mg by mouth at bedtime.   atorvastatin 40 MG tablet Commonly known as: LIPITOR Take 40 mg by mouth daily.   azithromycin 250 MG tablet Commonly known as: ZITHROMAX Take 250-500 mg by mouth See admin instructions. Prescribed and filled on 03/20/2019   carvedilol 6.25 MG tablet Commonly known as: COREG Take 1 tablet (6.25 mg total) by mouth 2 (two) times daily with a meal.   dicyclomine 10 MG capsule Commonly known as: BENTYL TAKE ONE CAPSULE BY MOUTH TWICE A DAY BEFORE A MEAL What changed: See the new instructions.   FLUoxetine 20 MG capsule Commonly known as: PROZAC Take 20 mg by mouth every morning.   fluticasone 50 MCG/ACT nasal spray Commonly known as: FLONASE Place 2 sprays into both nostrils daily as needed for allergies or rhinitis.  furosemide 20 MG tablet Commonly known as: Lasix Take 1 tablet (20 mg total) by mouth daily. Start taking on: March 25, 2019   glipiZIDE 5 MG tablet Commonly known as: GLUCOTROL Take 5 mg by mouth 2 (two) times daily before a meal.   LORazepam 0.5 MG tablet Commonly known as: ATIVAN Take 0.5 mg by mouth 3 (three) times daily.   losartan 25 MG tablet Commonly known as: COZAAR Take 0.5 tablets (12.5 mg total) by mouth daily. Start taking on: March 25, 2019   pantoprazole 40 MG tablet Commonly known as: PROTONIX Take 1 tablet (40 mg total) by mouth daily.   potassium chloride SA 20 MEQ tablet Commonly known as: KLOR-CON Take 1 tablet (20 mEq total) by mouth 2 (two) times daily.   predniSONE 10 MG tablet Commonly known as: DELTASONE Take 10 mg by mouth daily with breakfast. [id number]   SM Melatonin 3 MG Tabs Generic drug: Melatonin Take 3 mg by mouth at bedtime.   spironolactone 25 MG tablet Commonly known as: ALDACTONE Take 0.5 tablets (12.5 mg total) by mouth  daily. Start taking on: March 25, 2019   Vitamin D3 125 MCG (5000 UT) Caps Take 1 capsule by mouth daily.       Disposition   The patient will be discharged in stable condition to home.  Follow-up Information    [name], MD Follow up on 04/02/2019.   Specialty: Cardiology Why: 11:20 Garage Code [id number]  Contact information: [place] [address] [phone]        [name], MD. Daphane Shepherd on 04/07/2019.   Specialty: Family Medicine Why: @10 :40am Contact information: [place] [address] [phone]             Duration of Discharge Encounter: Greater than 35 minutes   Signed, Nelida Gores 03/24/2019, 4:17 PM

## 2019-04-02 ENCOUNTER — Other Ambulatory Visit: Payer: Self-pay

## 2019-04-02 ENCOUNTER — Telehealth (HOSPITAL_COMMUNITY): Payer: Self-pay | Admitting: Pharmacist

## 2019-04-02 ENCOUNTER — Encounter (HOSPITAL_COMMUNITY): Payer: Self-pay | Admitting: Cardiology

## 2019-04-02 ENCOUNTER — Ambulatory Visit (HOSPITAL_COMMUNITY)
Admit: 2019-04-02 | Discharge: 2019-04-02 | Disposition: A | Discharge status: Home / Self Care | Payer: Medicaid Other | Source: Ambulatory Visit | Attending: Cardiology | Admitting: Cardiology

## 2019-04-02 VITALS — BP 122/88 | HR 78 | Wt 188.2 lb

## 2019-04-02 DIAGNOSIS — I428 Other cardiomyopathies: Secondary | ICD-10-CM | POA: Diagnosis not present

## 2019-04-02 DIAGNOSIS — I2510 Atherosclerotic heart disease of native coronary artery without angina pectoris: Secondary | ICD-10-CM | POA: Insufficient documentation

## 2019-04-02 DIAGNOSIS — Z7984 Long term (current) use of oral hypoglycemic drugs: Secondary | ICD-10-CM | POA: Insufficient documentation

## 2019-04-02 DIAGNOSIS — Z8249 Family history of ischemic heart disease and other diseases of the circulatory system: Secondary | ICD-10-CM | POA: Diagnosis not present

## 2019-04-02 DIAGNOSIS — E119 Type 2 diabetes mellitus without complications: Secondary | ICD-10-CM | POA: Diagnosis not present

## 2019-04-02 DIAGNOSIS — J441 Chronic obstructive pulmonary disease with (acute) exacerbation: Secondary | ICD-10-CM | POA: Insufficient documentation

## 2019-04-02 DIAGNOSIS — F17210 Nicotine dependence, cigarettes, uncomplicated: Secondary | ICD-10-CM | POA: Diagnosis not present

## 2019-04-02 DIAGNOSIS — I5022 Chronic systolic (congestive) heart failure: Secondary | ICD-10-CM | POA: Insufficient documentation

## 2019-04-02 DIAGNOSIS — Z79899 Other long term (current) drug therapy: Secondary | ICD-10-CM | POA: Insufficient documentation

## 2019-04-02 DIAGNOSIS — I110 Hypertensive heart disease with heart failure: Secondary | ICD-10-CM | POA: Diagnosis not present

## 2019-04-02 DIAGNOSIS — E785 Hyperlipidemia, unspecified: Secondary | ICD-10-CM | POA: Insufficient documentation

## 2019-04-02 LAB — BASIC METABOLIC PANEL
Anion gap: 11 (ref 5–15)
BUN: 32 mg/dL — ABNORMAL HIGH (ref 6–20)
CO2: 21 mmol/L — ABNORMAL LOW (ref 22–32)
Calcium: 10.3 mg/dL (ref 8.9–10.3)
Chloride: 103 mmol/L (ref 98–111)
Creatinine, Ser: 1.4 mg/dL — ABNORMAL HIGH (ref 0.44–1.00)
GFR calc Af Amer: 48 mL/min — ABNORMAL LOW (ref >60)
GFR calc non Af Amer: 42 mL/min — ABNORMAL LOW (ref >60)
Glucose, Bld: 170 mg/dL — ABNORMAL HIGH (ref 70–99)
Potassium: 5.3 mmol/L — ABNORMAL HIGH (ref 3.5–5.1)
Sodium: 135 mmol/L (ref 135–145)

## 2019-04-02 MED ORDER — FUROSEMIDE 20 MG PO TABS: 20.0000 mg | ORAL_TABLET | ORAL | 2 refills | Status: DC

## 2019-04-02 MED ORDER — CHANTIX STARTING MONTH PAK 0.5 MG X 11 & 1 MG X 42 PO TABS: ORAL_TABLET | ORAL | 0 refills | Status: DC

## 2019-04-02 MED ORDER — SACUBITRIL-VALSARTAN 24-26 MG PO TABS: 1.0000 | ORAL_TABLET | Freq: Two times a day (BID) | ORAL | 6 refills | Status: DC

## 2019-04-02 NOTE — Patient Instructions (Addendum)
Lab work done today. We will notify you of any abnormal lab work. No news is good news!  Lab work will need to be done again in 7-10 days.  CHANGE Furosemide to 1 tab every other day.  STOP Losartan  START Entresto 24-26mg  tab two times daily.  START Chantix. Please follow the starter directions on the package.  Please call Dr. Myles Gip office in order to set up an appointment.  Your physician has requested that you have an echocardiogram. Echocardiography is a painless test that uses sound waves to create images of your heart. It provides your doctor with information about the size and shape of your heart and how well your heart's chambers and valves are working. This procedure takes approximately one hour. There are no restrictions for this procedure. This will be done at your follow up appointment.  Please follow up with the [facility] in 3 months.  At the [facility], you and your health needs are our priority. As part of our continuing mission to provide you with exceptional heart care, we have created designated Provider Care Teams. These Care Teams include your primary Cardiologist (physician) and Advanced Practice Providers (APPs- Physician Assistants and Nurse Practitioners) who all work together to provide you with the care you need, when you need it.   You may see any of the following providers on your designated Care Team at your next follow up: [name] Dr Glori Bickers . Dr Loralie Champagne . Darrick Grinder, NP . Lyda Jester, PA . Audry Riles, [name]   Please be sure to bring in all your medications bottles to every appointment.

## 2019-04-02 NOTE — Telephone Encounter (Signed)
Advanced Heart Failure Patient Advocate Encounter  Prior Authorization for Delene Loll has been approved.    PA# [id number] Effective dates: 04/02/2019 - 04/01/2020  Patients co-pay is $3.00  Audry Riles, [name], BCPS, BCCP, CPP Heart Failure Clinic Pharmacist [phone]

## 2019-04-02 NOTE — Progress Notes (Signed)
Advanced Heart Failure Clinic Note   Referring Physician: PCP: [name], MD PCP-Cardiologist: Rozann Lesches, MD  [facility]: Dr. Aundra Dubin   Reason for Visit: [facility] F/u, New diagnosis of NICM/ Systolic Heart Failure  HPI:  59 y/o female w/ history of DM, HTN, hyperlipidemia, and smoking.  She was admitted on 1/22 for about 1 week of shortness of breath. She had been started on azithromycin and prednisone by PCP and was taking inhalers for ?COPD exacerbation without relief.  COVID negative.  Dyspnea primarily exertional, no orthopnea. No chest pain reported.  In ER at [facility], pro-BNP was elevated and CXR suggestive of volume overload. Slightly elevated HS-TnI with no trend, not suggestive of ACS.  She was admitted for management of new diagnosis of CHF and seen by Dr. Domenic Polite.  Echo was done, showed EF 20-25% with mild RV dysfunction. She was started on IV Lasix for diuresed then transitioned to PO.  She was transferred from [facility] to [facility] for RHC/LHC to assess filling pressures/CO and also to assess for coronary disease as etiology.   Of note, her father developed CHF in his 88s, nonischemic cardiomyopathy.  He had ICD and later heart transplant.   Cath was done 1/25 and showed no significant coronary disease and optimized filling pressures with preserved cardiac output (see angiographic and hemodynamic data below). Diagnosed w/ NICM. cMRI showed no myocardial LGE, so no definitive evidence for prior MI, infiltrative disease, or cardiomyopathy. RV normal w/ EF 35%. She was started on GDMT w/ ARB, ? blocker and MRA + loop diuretic. Actos for DM was discontinued w/ Plans to consider eventual SGLT2i in the future.   She presents to clinic for post hospital f/u. Doing well post discharge. No exertional symptoms. No resting dyspnea. Wt has been stable at home at 185 lb. No LEE, orthopnea/PND. Reports full med compliance and adhering to low sodium diet. Tolerating meds well w/o side effects.  BP stable at 122/88. Pulse rate 78 bpm. Cath site stable. Still smoking cigarettes.   PMH: 1. Hyperlipidemia 2. Type 2 diabetes.  3. Chronic systolic CHF:  - Echo ([id number]).  EF 20-25%, mildly deccreased RV function.  - RHC/LHC (1/21): Nonobstructive CAD; mean RA 9, PA 28/5, mean PCWP 9, CI 2.37 - Cardiac MRI (1/21): LV EF 31%, RVEF 28%, No myocardial LGE.    Review of Systems: All systems reviewed and negative except as per HPI.    Current Outpatient Medications  Medication Sig Dispense Refill  . ALPRAZolam (XANAX) 1 MG tablet Take 1 mg by mouth at bedtime.    [name] atorvastatin (LIPITOR) 40 MG tablet Take 40 mg by mouth daily.    [name] azithromycin (ZITHROMAX) 250 MG tablet Take 250-500 mg by mouth See admin instructions. Prescribed and filled on 03/20/2019    . carvedilol (COREG) 6.25 MG tablet Take 1 tablet (6.25 mg total) by mouth 2 (two) times daily with a meal. 60 tablet 2  . Cholecalciferol (VITAMIN D3) 125 MCG (5000 UT) CAPS Take 1 capsule by mouth daily.    [name] dicyclomine (BENTYL) 10 MG capsule TAKE ONE CAPSULE BY MOUTH TWICE A DAY BEFORE A MEAL (Patient taking differently: Take 10 mg by mouth 3 (three) times daily before meals. ) 60 capsule 2  . FLUoxetine (PROZAC) 20 MG capsule Take 20 mg by mouth every morning.     . fluticasone (FLONASE) 50 MCG/ACT nasal spray Place 2 sprays into both nostrils daily as needed for allergies or rhinitis.    . furosemide (LASIX)  20 MG tablet Take 1 tablet (20 mg total) by mouth every other day. 30 tablet 2  . glipiZIDE (GLUCOTROL) 5 MG tablet Take 5 mg by mouth 2 (two) times daily before a meal.     . loratadine (CLARITIN) 10 MG tablet Take 10 mg by mouth daily.    [name] LORazepam (ATIVAN) 0.5 MG tablet Take 0.5 mg by mouth 3 (three) times daily.     . Melatonin (SM MELATONIN) 3 MG TABS Take 3 mg by mouth at bedtime.    . pantoprazole (PROTONIX) 40 MG tablet Take 1 tablet (40 mg total) by mouth daily. 30 tablet 0  . potassium chloride SA (KLOR-CON) 20 MEQ  tablet Take 1 tablet (20 mEq total) by mouth 2 (two) times daily. 60 tablet 2  . predniSONE (DELTASONE) 10 MG tablet Take 10 mg by mouth daily with breakfast. [id number]    . spironolactone (ALDACTONE) 25 MG tablet Take 0.5 tablets (12.5 mg total) by mouth daily. 30 tablet 2  . albuterol (VENTOLIN HFA) 108 (90 Base) MCG/ACT inhaler Inhale 2 puffs into the lungs every 6 (six) hours as needed for wheezing or shortness of breath.    . sacubitril-valsartan (ENTRESTO) 24-26 MG Take 1 tablet by mouth 2 (two) times daily. 60 tablet 6  . varenicline (CHANTIX STARTING MONTH PAK) 0.5 MG X 11 & 1 MG X 42 tablet Take one 0.5 mg tablet by mouth once daily for 3 days, then increase to one 0.5 mg tablet twice daily for 4 days, then increase to one 1 mg tablet twice daily. 53 tablet 0   No current facility-administered medications for this encounter.    No Known Allergies    Social History   Socioeconomic History  . Marital status: Divorced    Spouse name: Not on file  . Number of children: Not on file  . Years of education: Not on file  . Highest education level: Not on file  Occupational History  . Not on file  Tobacco Use  . Smoking status: Current Every Day Smoker    Packs/day: 0.50    Years: 37.00    Pack years: 18.50    Types: Cigarettes  . Smokeless tobacco: Never Used  Substance and Sexual Activity  . Alcohol use: No  . Drug use: No  . Sexual activity: Yes    Birth control/protection: None  Other Topics Concern  . Not on file  Social History Narrative  . Not on file   Social Determinants of Health   Financial Resource Strain: Unknown  . Difficulty of Paying Living Expenses: Patient refused  Food Insecurity: Unknown  . Worried About Charity fundraiser in the Last Year: Patient refused  . Ran Out of Food in the Last Year: Patient refused  Transportation Needs: Unknown  . Lack of Transportation (Medical): Patient refused  . Lack of Transportation (Non-Medical):  Patient refused  Physical Activity: Unknown  . Days of Exercise per Week: Patient refused  . Minutes of Exercise per Session: Patient refused  Stress: Unknown  . Feeling of Stress : Patient refused  Social Connections: Unknown  . Frequency of Communication with Friends and Family: Patient refused  . Frequency of Social Gatherings with Friends and Family: Patient refused  . Attends Religious Services: Patient refused  . Active Member of Clubs or Organizations: Patient refused  . Attends Archivist Meetings: Patient refused  . Marital Status: Patient refused  Intimate Partner Violence: Unknown  . Fear of Current or Ex-Partner: Patient  refused  . Emotionally Abused: Patient refused  . Physically Abused: Patient refused  . Sexually Abused: Patient refused      Family History  Problem Relation Age of Onset  . Arthritis Mother   . Hernia Mother   . Constipation Mother   . Colon cancer Neg Hx     Vitals:   04/02/19 1134  BP: 122/88  Pulse: 78  SpO2: 96%  Weight: 85.4 kg (188 lb 3.2 oz)     PHYSICAL EXAM: General:  Well appearing WF. Appears older than actual age. No respiratory difficulty HEENT: normal Neck: supple. no JVD. Carotids 2+ bilat; no bruits. No lymphadenopathy or thyromegaly appreciated. Cor: PMI nondisplaced. Regular rate & rhythm. No rubs, gallops or murmurs. Lungs: clear Abdomen: soft, nontender, nondistended. No hepatosplenomegaly. No bruits or masses. Good bowel sounds. Extremities: no cyanosis, clubbing, rash, edema Neuro: alert & oriented x 3, cranial nerves grossly intact. moves all 4 extremities w/o difficulty. Affect pleasant.  ECG: not performed    ASSESSMENT & PLAN:  1. Chronic Systolic CHF: New diagnosis. Echo 1/21 w/ EF 20-25%, mild RV dysfunction. No prior cardiac history but father has history of nonischemic cardiomyopathy withheart transplant.LHC/RHC 1/25 showed no significant coronary disease and optimized filling pressures  with preserved cardiac output. cMRi showed no myocardial LGE, so no definitive evidence for prior MI, infiltrative disease, or cardiomyopathy. RV normal w/ EF 35%. Volume stable. NYHA Class II. BP tolerating HF meds.  - Order [employer] gene testing to see if we can identify a gene triggering her cardiomyopathy - Stop Losartan. - Start Entresto 24-26 bid - Change Lasix to every other day - Continue Spironolactone 12.5 mg daily  - Continue Coreg 6.25 mg bid  - Consider future addition of dapagliflozin  - Check BMP today and again in 7-10 days (repeat BMP can be done in [place]) - Plan gradual titration of HF meds w/ plans to repeat echo after 3 months of optimal medical therapy. Refer to EP if EF remains < 35% for ICD consideration.  - Discussed continuation of low sodium diet and daily wts 2. Smoking: Strongly urged to quit.  - Start Chantix  3. HTN: stable on current regimen - check BMP  4. Type 2 diabetes: She was on pioglitazone prior to recent admit. This has been discontinued.  - Would consider replacement of glipizide with metformin eventually.  - Would start dapagliflozin eventually.  Plan repeat BMP in 7-10 days in [place]. F/u with Dr. Domenic Polite in [place] in 2 weeks for further titration of HF regimen. Try to increase Entresto and spironolactone if BP allows. Consider future addition of dapagliflozin. F/u in [facility] in 3 months w/ repeat echo.    Lyda Jester, PA-C 04/02/19   Patient seen with PA, agree with the above note.    She is doing well since discharge, NYHA class II.  She is not volume overloaded on exam.   Today, I will stop losartan and start Entresto 24/26 bid.  Decrease Lasix to 20 mg qod.  BMET today and again in 10 days.   I will give her a prescription for Chantix as she is interested in quitting smoking. Must quit smoking if she is to be a transplant candidate.   She will see Dr Domenic Polite for followup in 3 wks with meditration. I will see her back in  3 months with echo.   Loralie Champagne 04/02/2019

## 2019-04-03 ENCOUNTER — Telehealth (HOSPITAL_COMMUNITY): Payer: Self-pay | Admitting: *Deleted

## 2019-04-03 NOTE — Telephone Encounter (Signed)
Spoke w/pt, she is aware, she states she uses a lot of Mrs Deliah Boston salt substitute, advised to decrease this as it is high in K, she is agreeable. She states she called Dr McDowell's office for an appt but they said they needed a referral. Referral placed and in-box message sent to his office to sch pt in 2 weeks with labs, pt is aware they should be contacting her soon to get this scheduled.

## 2019-04-03 NOTE — Telephone Encounter (Signed)
-----   Message from [name], MD sent at 04/02/2019  6:50 PM EST ----- Low K diet, make sure no K supplement.  We cut back on Lasix today.  Should have followup BMET in 10 days.

## 2019-04-03 NOTE — Addendum Note (Signed)
Encounter addended by: Scarlette Calico, RN on: 04/03/2019 10:46 AM  Actions taken: Order list changed, Diagnosis association updated

## 2019-04-13 ENCOUNTER — Other Ambulatory Visit (HOSPITAL_COMMUNITY): Payer: Self-pay | Admitting: Cardiology

## 2019-04-13 DIAGNOSIS — I5022 Chronic systolic (congestive) heart failure: Secondary | ICD-10-CM

## 2019-04-13 NOTE — Progress Notes (Signed)
Patient presented to [facility]  Order needed  order sent via epic routing to [phone]

## 2019-04-20 ENCOUNTER — Other Ambulatory Visit (HOSPITAL_COMMUNITY): Payer: Self-pay | Admitting: Cardiology

## 2019-04-20 ENCOUNTER — Encounter: Payer: Self-pay | Admitting: Cardiology

## 2019-04-20 NOTE — Progress Notes (Signed)
Cardiology Office Note  Date: 04/21/2019   ID: [name], [name] March 03, 1960, MRN [id number]  PCP:  [name], MD  Cardiologist:  Rozann Lesches, MD Electrophysiologist:  None   Chief Complaint  Patient presents with  . Follow-up cardiomyopathy    History of Present Illness: [name] is a 59 y.o. female presenting for a follow-up visit, recently seen by Dr. Aundra Dubin in the Heart Failure clinic in early February.  She recently underwent cardiac testing with diagnosis of nonischemic cardiomyopathy, no coronary artery disease at cardiac catheterization, cardiac MRI without evidence of infiltrative process.  LVEF is in the range of 20 to 25% with mild RV dysfunction.  She presents today for a follow-up visit.  States that she feels "okay."  She describes NYHA class II dyspnea with typical activities, somewhat worse if she pushes herself.  She had to stop while she was pushing trash cans to the end of her driveway recently.  She has had no orthostatic dizziness, no palpitations or syncope.  I reviewed her most recent medication adjustments.  Blood pressure was low on evaluation initially.  Retake by me was 100/58 in the right arm.  She does not have a blood pressure cuff at home, but has been weighing herself regularly and noticed only a fluctuation of 1 pound.  I asked her to stop Lasix and potassium supplements, only use this if she gains 2 pounds in 24 hours or 5 pounds in a week.  There is not a lot of room to continue to uptitrate her regimen otherwise.  I also talked with her about continued efforts at smoking cessation, she has been taking Chantix and cut back but not quit as yet.  I talked with her about referral to an endocrinologist as well.  She did have lab work done in follow-up on February 15 showing glucose 280, BUN 30, creatinine 1.28, potassium 5.0, sodium 135.  Past Medical History:  Diagnosis Date  . Anal fissure   . Anxiety   . Depression   . Essential  hypertension   . GERD (gastroesophageal reflux disease)   . History of seizures as a child    None since age 78  . Mixed hyperlipidemia   . Nonischemic cardiomyopathy ([place])   . Type 2 diabetes mellitus ([place])     Past Surgical History:  Procedure Laterality Date  . BACK SURGERY  2005   lumbar disckectomy  . COLONOSCOPY  10/01/2011   Procedure: COLONOSCOPY;  Surgeon: [name], MD;  [facility]: [facility];  Service: Endoscopy;  Laterality: N/A;  730  . COLONOSCOPY N/A 10/04/2016   Procedure: COLONOSCOPY;  Surgeon: [name], MD;  Location: [facility];  Service: Endoscopy;  Laterality: N/A;  10:30  . DILATION AND CURETTAGE OF UTERUS    . FOOT BONE EXCISION     right  . POLYPECTOMY  10/04/2016   Procedure: POLYPECTOMY;  Surgeon: [name], MD;  Location: [facility];  Service: Endoscopy;;  colon  . RIGHT/LEFT HEART CATH AND CORONARY ANGIOGRAPHY N/A 03/23/2019   Procedure: RIGHT/LEFT HEART CATH AND CORONARY ANGIOGRAPHY;  Surgeon: [name], MD;  Location: [facility];  Service: Cardiovascular;  Laterality: N/A;    Current Outpatient Medications  Medication Sig Dispense Refill  . ALPRAZolam (XANAX) 1 MG tablet Take 1 mg by mouth at bedtime.    [name] atorvastatin (LIPITOR) 40 MG tablet Take 40 mg by mouth daily.    . carvedilol (COREG) 6.25  MG tablet Take 1 tablet (6.25 mg total) by mouth 2 (two) times daily with a meal. 60 tablet 2  . Cholecalciferol (VITAMIN D3) 125 MCG (5000 UT) CAPS Take 1 capsule by mouth daily.    [name] dicyclomine (BENTYL) 10 MG capsule TAKE ONE CAPSULE BY MOUTH TWICE A DAY BEFORE A MEAL (Patient taking differently: Take 10 mg by mouth 3 (three) times daily before meals. ) 60 capsule 2  . FLUoxetine (PROZAC) 20 MG capsule Take 20 mg by mouth every other day.     [name] glipiZIDE (GLUCOTROL) 5 MG tablet Take 5 mg by mouth 2 (two) times daily before a meal.     . loratadine (CLARITIN) 10 MG tablet Take 10 mg by mouth daily.    [name] LORazepam (ATIVAN)  0.5 MG tablet Take 0.5 mg by mouth 3 (three) times daily.     . Melatonin (SM MELATONIN) 3 MG TABS Take 3 mg by mouth at bedtime.    . pantoprazole (PROTONIX) 40 MG tablet Take 1 tablet (40 mg total) by mouth daily. 30 tablet 0  . sacubitril-valsartan (ENTRESTO) 24-26 MG Take 1 tablet by mouth 2 (two) times daily. 60 tablet 6  . spironolactone (ALDACTONE) 25 MG tablet Take 0.5 tablets (12.5 mg total) by mouth daily. 30 tablet 2  . varenicline (CHANTIX STARTING MONTH PAK) 0.5 MG X 11 & 1 MG X 42 tablet Take one 0.5 mg tablet by mouth once daily for 3 days, then increase to one 0.5 mg tablet twice daily for 4 days, then increase to one 1 mg tablet twice daily. 53 tablet 0  . furosemide (LASIX) 20 MG tablet Take 1 tablet (20 mg total) by mouth daily as needed (for weight gain of 2 lbs in 24 hours or 5 lbs in one week). 30 tablet 2  . potassium chloride SA (KLOR-CON) 20 MEQ tablet Take 1 tablet (20 mEq total) by mouth daily as needed (with furosemide). 30 tablet 2   No current facility-administered medications for this visit.   Allergies:  Patient has no known allergies.   ROS:  Please see the history of present illness. Otherwise, complete review of systems is positive for none.  All other systems are reviewed and negative.   Physical Exam: VS:  BP (!) 88/57   Pulse 69   Ht 5\' 7"  (1.702 m)   Wt 191 lb (86.6 kg)   LMP 09/25/2011   BMI 29.91 kg/m , BMI Body mass index is 29.91 kg/m.  Wt Readings from Last 3 Encounters:  04/21/19 191 lb (86.6 kg)  04/02/19 188 lb 3.2 oz (85.4 kg)  03/24/19 192 lb 9.6 oz (87.4 kg)    General: Patient appears comfortable at rest. HEENT: Conjunctiva and lids normal, wearing a mask. Neck: Supple, no elevated JVP or carotid bruits, no thyromegaly. Lungs: Clear to auscultation, nonlabored breathing at rest. Cardiac: Regular rate and rhythm, no S3 or significant systolic murmur, no pericardial rub. Abdomen: Soft, nontender, bowel sounds present. Extremities:  No pitting edema, distal pulses 2+. Skin: Warm and dry. Musculoskeletal: No kyphosis. Neuropsychiatric: Alert and oriented x3, affect grossly appropriate.  ECG:  An ECG dated 03/23/2019 was personally reviewed today and demonstrated:  Sinus rhythm with LVH and IVCD.  Recent Labwork: 05/01/2018: Magnesium 1.8 03/20/2019: ALT 23; AST 17; B Natriuretic Peptide 464.0 03/24/2019: Hemoglobin 12.4; Platelets 260 04/02/2019: BUN 32; Creatinine, Ser 1.40; Potassium 5.3; Sodium 135   Other Studies Reviewed Today:  Echocardiogram 03/21/2019: 1. Left ventricular ejection fraction, by visual estimation,  is 20 to  25%. The left ventricle has severely decreased function. There is mildly  increased left ventricular hypertrophy.  2. Elevated left ventricular end-diastolic pressure.  3. Left ventricular diastolic parameters are consistent with Grade III  diastolic dysfunction (restrictive).  4. The left ventricle demonstrates global hypokinesis.  5. Global right ventricle has mildly reduced systolic function.The right  ventricular size is normal. No increase in right ventricular wall  thickness.  6. Left atrial size was normal.  7. Right atrial size was normal.  8. The mitral valve is grossly normal. Moderate mitral valve  regurgitation.  9. The tricuspid valve is grossly normal.  10. The tricuspid valve is grossly normal. Tricuspid valve regurgitation  is trivial.  11. The aortic valve is tricuspid. Aortic valve regurgitation is not  visualized. No evidence of aortic valve sclerosis or stenosis.  12. Pulmonic regurgitation is mild.  13. The pulmonic valve was grossly normal. Pulmonic valve regurgitation is  mild.  14. The inferior vena cava is normal in size with greater than 50%  respiratory variability, suggesting right atrial pressure of 3 mmHg.   Cardiac catheterization 03/23/2019: 1. Normal filling pressures.  2. Preserved cardiac output  3. No significant coronary disease.    Nonischemic cardiomyopathy, now well-compensated.   Cardiac MRI 03/24/2019: IMPRESSION: 1.  Mildly dilated LV with diffuse hypokinesis, EF 21%.  2.  Normal RV size with EF 35%.  3. No myocardial LGE, so no definitive evidence for prior MI, infiltrative disease, or cardiomyopathy.  Assessment and Plan:  1.  Nonischemic cardiomyopathy with LVEF 20 to 25%.  Recent cardiac testing reviewed above.  For now, plan is to continue Coreg, Entresto, and Aldactone at current doses.  Lasix and potassium supplements will be only used together if she gains 2 pounds in 24 hours or 5 pounds in a week.  Follow-up BMET for next clinical visit in 1 month in the [place] office.  She has follow-up later in the heart failure clinic in May with a repeat echocardiogram at that time.  2.  Chronic systolic heart failure, no fluid overload present.  As noted above, Lasix and potassium supplements will be used on an as-needed basis guided by daily weights.  3.  Uncontrolled type 2 diabetes mellitus.  I am referring her to an endocrinologist for further management.  If she can afford it, switching to an SGLT2 inhibitor would be a good option, also possibly changing her to Metformin.  4.  CKD stage III, creatinine down to 1.28 and potassium high normal range.  Continue current doses of Aldactone and Entresto.  Standing potassium supplements are being discontinued, only to be used when she takes Lasix based on weight change.  5.  Tobacco abuse.  She is taking Chantix and trying to cut back at this point.  I talked with her further about complete smoking cessation.  Medication Adjustments/Labs and Tests Ordered: Current medicines are reviewed at length with the patient today.  Concerns regarding medicines are outlined above.   Tests Ordered: Orders Placed This Encounter  Procedures  . Basic metabolic panel  . Ambulatory referral to Endocrinology    Medication Changes: Meds ordered this encounter   Medications  . furosemide (LASIX) 20 MG tablet    Sig: Take 1 tablet (20 mg total) by mouth daily as needed (for weight gain of 2 lbs in 24 hours or 5 lbs in one week).    Dispense:  30 tablet    Refill:  2    04/21/2019 change  in directions  . potassium chloride SA (KLOR-CON) 20 MEQ tablet    Sig: Take 1 tablet (20 mEq total) by mouth daily as needed (with furosemide).    Dispense:  30 tablet    Refill:  2    04/21/2019 change in directions    Disposition:  Follow up 1 month in the [place] office with [place].  Signed, Satira Sark, MD, [facility] 04/21/2019 11:03 AM    [facility] [place], [place], [place] [address] Phone: [phone]; Fax: [phone]

## 2019-04-21 ENCOUNTER — Other Ambulatory Visit: Payer: Self-pay

## 2019-04-21 ENCOUNTER — Encounter: Payer: Self-pay | Admitting: Cardiology

## 2019-04-21 ENCOUNTER — Ambulatory Visit (INDEPENDENT_AMBULATORY_CARE_PROVIDER_SITE_OTHER): Payer: Medicaid Other | Admitting: Cardiology

## 2019-04-21 VITALS — BP 88/57 | HR 69 | Ht 67.0 in | Wt 191.0 lb

## 2019-04-21 DIAGNOSIS — I5022 Chronic systolic (congestive) heart failure: Secondary | ICD-10-CM

## 2019-04-21 DIAGNOSIS — I428 Other cardiomyopathies: Secondary | ICD-10-CM | POA: Diagnosis not present

## 2019-04-21 DIAGNOSIS — E1165 Type 2 diabetes mellitus with hyperglycemia: Secondary | ICD-10-CM | POA: Diagnosis not present

## 2019-04-21 DIAGNOSIS — N1832 Chronic kidney disease, stage 3b: Secondary | ICD-10-CM

## 2019-04-21 MED ORDER — POTASSIUM CHLORIDE CRYS ER 20 MEQ PO TBCR: 20.0000 meq | EXTENDED_RELEASE_TABLET | Freq: Every day | ORAL | 2 refills | Status: DC | PRN

## 2019-04-21 MED ORDER — FUROSEMIDE 20 MG PO TABS: 20.0000 mg | ORAL_TABLET | Freq: Every day | ORAL | 2 refills | Status: DC | PRN

## 2019-04-21 NOTE — Patient Instructions (Addendum)
Medication Instructions:    Your physician has recommended you make the following change in your medication:   Stop taking furosemide and potassium daily  Start taking furosemide 20 mg daily as needed for weight gain of 2 lbs in 24 hours or 5 lbs in one week.   Start potassium 20 meq as needed with furosemide only  Continue other medications the same  Labwork:  Your physician recommends that you return for non-fasting lab work in: one month just before your next visit to check your BMET. You may have this done at [facility] ([address] [place]) or [facility]. No appointment is needed.  Testing/Procedures:  NONE  Follow-Up:  Your physician recommends that you schedule a follow-up appointment in: 1 month (office) with Bernerd Pho PA at the [place] office.   Any Other Special Instructions Will Be Listed Below (If Applicable).  You have been referred to Dr. Dorris Fetch to manage your diabetes.  If you need a refill on your cardiac medications before your next appointment, please call your pharmacy.

## 2019-05-12 ENCOUNTER — Other Ambulatory Visit: Payer: Self-pay

## 2019-05-12 ENCOUNTER — Encounter: Payer: Self-pay | Admitting: Nutrition

## 2019-05-12 ENCOUNTER — Encounter: Payer: Medicaid Other | Attending: Family Medicine | Admitting: Nutrition

## 2019-05-12 ENCOUNTER — Encounter: Payer: Self-pay | Admitting: "Endocrinology

## 2019-05-12 ENCOUNTER — Ambulatory Visit (INDEPENDENT_AMBULATORY_CARE_PROVIDER_SITE_OTHER): Payer: Medicaid Other | Admitting: "Endocrinology

## 2019-05-12 VITALS — BP 110/71 | HR 73 | Ht 67.0 in | Wt 192.0 lb

## 2019-05-12 DIAGNOSIS — E1122 Type 2 diabetes mellitus with diabetic chronic kidney disease: Secondary | ICD-10-CM | POA: Diagnosis not present

## 2019-05-12 DIAGNOSIS — E782 Mixed hyperlipidemia: Secondary | ICD-10-CM | POA: Insufficient documentation

## 2019-05-12 DIAGNOSIS — E669 Obesity, unspecified: Secondary | ICD-10-CM | POA: Insufficient documentation

## 2019-05-12 DIAGNOSIS — I10 Essential (primary) hypertension: Secondary | ICD-10-CM

## 2019-05-12 DIAGNOSIS — N183 Chronic kidney disease, stage 3 unspecified: Secondary | ICD-10-CM | POA: Diagnosis not present

## 2019-05-12 NOTE — Patient Instructions (Signed)

## 2019-05-12 NOTE — Progress Notes (Signed)
Endocrinology Consult Note       05/12/2019, 4:29 PM   Subjective:    Patient ID: [name], female    DOB: [birth year].  [name] is being seen in consultation for management of currently uncontrolled symptomatic diabetes requested by  [name], MD.   Past Medical History:  Diagnosis Date  . Anal fissure   . Anxiety   . Depression   . Essential hypertension   . GERD (gastroesophageal reflux disease)   . History of seizures as a child    None since age 40  . Mixed hyperlipidemia   . Nonischemic cardiomyopathy ([place])   . Type 2 diabetes mellitus ([place])     Past Surgical History:  Procedure Laterality Date  . BACK SURGERY  2005   lumbar disckectomy  . COLONOSCOPY  10/01/2011   Procedure: COLONOSCOPY;  Surgeon: [name], MD;  [facility]: [facility];  Service: Endoscopy;  Laterality: N/A;  730  . COLONOSCOPY N/A 10/04/2016   Procedure: COLONOSCOPY;  Surgeon: [name], MD;  Location: [facility];  Service: Endoscopy;  Laterality: N/A;  10:30  . DILATION AND CURETTAGE OF UTERUS    . FOOT BONE EXCISION     right  . POLYPECTOMY  10/04/2016   Procedure: POLYPECTOMY;  Surgeon: [name], MD;  Location: [facility];  Service: Endoscopy;;  colon  . RIGHT/LEFT HEART CATH AND CORONARY ANGIOGRAPHY N/A 03/23/2019   Procedure: RIGHT/LEFT HEART CATH AND CORONARY ANGIOGRAPHY;  Surgeon: [name], MD;  Location: [facility];  Service: Cardiovascular;  Laterality: N/A;    Social History   Socioeconomic History  . Marital status: Divorced    Spouse name: Not on file  . Number of children: Not on file  . Years of education: Not on file  . Highest education level: Not on file  Occupational History  . Not on file  Tobacco Use  . Smoking status: Current Every Day Smoker    Packs/day: 0.50    Years: 37.00    Pack years: 18.50    Types: Cigarettes  . Smokeless  tobacco: Never Used  . Tobacco comment: less than 1/2 pack per day  Substance and Sexual Activity  . Alcohol use: No  . Drug use: No  . Sexual activity: Yes    Birth control/protection: None  Other Topics Concern  . Not on file  Social History Narrative  . Not on file   Social Determinants of Health   Financial Resource Strain:   . Difficulty of Paying Living Expenses:   Food Insecurity:   . Worried About Charity fundraiser in the Last Year:   . Arboriculturist in the Last Year:   Transportation Needs:   . Film/video editor (Medical):   [name] Lack of Transportation (Non-Medical):   Physical Activity:   . Days of Exercise per Week:   . Minutes of Exercise per Session:   Stress:   . Feeling of Stress :   Social Connections:   . Frequency of Communication with Friends and Family:   . Frequency of Social Gatherings  with Friends and Family:   . Attends Religious Services:   . Active Member of Clubs or Organizations:   . Attends Archivist Meetings:   [name] Marital Status:     Family History  Problem Relation Age of Onset  . Arthritis Mother   . Hernia Mother   . Constipation Mother   . Colon cancer Neg Hx     Outpatient Encounter Medications as of 05/12/2019  Medication Sig  . atorvastatin (LIPITOR) 40 MG tablet Take 40 mg by mouth daily.  [name] ALPRAZolam (XANAX) 1 MG tablet Take 1 mg by mouth at bedtime.  . carvedilol (COREG) 6.25 MG tablet Take 1 tablet (6.25 mg total) by mouth 2 (two) times daily with a meal.  . Cholecalciferol (VITAMIN D3) 125 MCG (5000 UT) CAPS Take 1 capsule by mouth daily.  [name] dicyclomine (BENTYL) 10 MG capsule TAKE ONE CAPSULE BY MOUTH TWICE A DAY BEFORE A MEAL (Patient taking differently: Take 10 mg by mouth 3 (three) times daily before meals. )  . FLUoxetine (PROZAC) 20 MG capsule Take 20 mg by mouth every other day.   [name] glipiZIDE (GLUCOTROL) 5 MG tablet Take 5 mg by mouth 2 (two) times daily before a meal.   . loratadine (CLARITIN) 10 MG  tablet Take 10 mg by mouth daily.  [name] LORazepam (ATIVAN) 0.5 MG tablet Take 0.5 mg by mouth 3 (three) times daily.   . Melatonin (SM MELATONIN) 3 MG TABS Take 3 mg by mouth at bedtime.  . pantoprazole (PROTONIX) 40 MG tablet Take 1 tablet (40 mg total) by mouth daily.  . sacubitril-valsartan (ENTRESTO) 24-26 MG Take 1 tablet by mouth 2 (two) times daily.  [name] spironolactone (ALDACTONE) 25 MG tablet Take 0.5 tablets (12.5 mg total) by mouth daily.  . varenicline (CHANTIX STARTING MONTH PAK) 0.5 MG X 11 & 1 MG X 42 tablet Take one 0.5 mg tablet by mouth once daily for 3 days, then increase to one 0.5 mg tablet twice daily for 4 days, then increase to one 1 mg tablet twice daily.  . [DISCONTINUED] atorvastatin (LIPITOR) 40 MG tablet Take 40 mg by mouth daily.  . [DISCONTINUED] furosemide (LASIX) 20 MG tablet Take 1 tablet (20 mg total) by mouth daily as needed (for weight gain of 2 lbs in 24 hours or 5 lbs in one week).  . [DISCONTINUED] potassium chloride SA (KLOR-CON) 20 MEQ tablet Take 1 tablet (20 mEq total) by mouth daily as needed (with furosemide).   No facility-administered encounter medications on file as of 05/12/2019.    ALLERGIES: No Known Allergies  VACCINATION STATUS:  There is no immunization history on file for this patient.  Diabetes She presents for her initial diabetic visit. She has type 2 diabetes mellitus. Onset time: She was diagnosed at approximate age of 61 years. Her disease course has been worsening. There are no hypoglycemic associated symptoms. Pertinent negatives for hypoglycemia include no confusion, headaches, pallor or seizures. Associated symptoms include fatigue, polydipsia and polyuria. Pertinent negatives for diabetes include no chest pain and no polyphagia. There are no hypoglycemic complications. Symptoms are worsening. Diabetic complications include heart disease and nephropathy. Risk factors for coronary artery disease include dyslipidemia, diabetes mellitus,  obesity, sedentary lifestyle, post-menopausal and tobacco exposure. Current diabetic treatments: She is currently on glipizide 5 mg p.o. twice daily. Her weight is fluctuating minimally. She is following a generally unhealthy diet. When asked about meal planning, she reported none. She has not had a previous visit with a  dietitian. She rarely participates in exercise. Her home blood glucose trend is fluctuating minimally. Her overall blood glucose range is 140-180 mg/dl. (She brought with her a log book showing fasting blood glucose profile ranging between 129-165, postprandial blood glucose ranges from 196-272. She did not document any hypoglycemia.  Her most recent A1c was 7.8%.) An ACE inhibitor/angiotensin II receptor blocker is being taken. Eye exam is current.  Hyperlipidemia This is a chronic problem. The current episode started more than 1 year ago. Exacerbating diseases include chronic renal disease, diabetes and obesity. Pertinent negatives include no chest pain, myalgias or shortness of breath. Current antihyperlipidemic treatment includes statins. Risk factors for coronary artery disease include dyslipidemia, diabetes mellitus, obesity, a sedentary lifestyle and post-menopausal.  Hypertension This is a chronic problem. The current episode started more than 1 year ago. The problem is controlled. Pertinent negatives include no chest pain, headaches, palpitations or shortness of breath. Risk factors for coronary artery disease include diabetes mellitus, dyslipidemia, obesity, smoking/tobacco exposure, sedentary lifestyle and post-menopausal state. Past treatments include ACE inhibitors. Hypertensive end-organ damage includes kidney disease and heart failure. Identifiable causes of hypertension include chronic renal disease.     Review of Systems  Constitutional: Positive for fatigue. Negative for chills, fever and unexpected weight change.  HENT: Negative for trouble swallowing and voice change.    Eyes: Negative for visual disturbance.  Respiratory: Negative for cough, shortness of breath and wheezing.   Cardiovascular: Negative for chest pain, palpitations and leg swelling.  Gastrointestinal: Negative for diarrhea, nausea and vomiting.  Endocrine: Positive for polydipsia and polyuria. Negative for cold intolerance, heat intolerance and polyphagia.  Musculoskeletal: Negative for arthralgias and myalgias.  Skin: Negative for color change, pallor, rash and wound.  Neurological: Negative for seizures and headaches.  Psychiatric/Behavioral: Negative for confusion and suicidal ideas.    Objective:    Vitals with BMI 05/12/2019 04/21/2019 04/02/2019  Height 5\' 7"  5\' 7"  -  Weight 192 lbs 191 lbs 188 lbs 3 oz  BMI 30.06 [id number]  Systolic [id number]  Diastolic 71 57 88  Pulse 73 69 78    BP 110/71   Pulse 73   Ht 5\' 7"  (1.702 m)   Wt 192 lb (87.1 kg)   LMP 09/25/2011   BMI 30.07 kg/m   Wt Readings from Last 3 Encounters:  05/12/19 192 lb (87.1 kg)  04/21/19 191 lb (86.6 kg)  04/02/19 188 lb 3.2 oz (85.4 kg)     Physical Exam Constitutional:      Appearance: She is well-developed.  HENT:     Head: Normocephalic and atraumatic.  Neck:     Thyroid: No thyromegaly.     Trachea: No tracheal deviation.  Cardiovascular:     Rate and Rhythm: Normal rate and regular rhythm.  Pulmonary:     Effort: Pulmonary effort is normal.  Abdominal:     Tenderness: There is no abdominal tenderness. There is no guarding.  Musculoskeletal:        General: Normal range of motion.     Cervical back: Normal range of motion and neck supple.  Skin:    General: Skin is warm and dry.     Coloration: Skin is not pale.     Findings: No erythema or rash.  Neurological:     Mental Status: She is alert and oriented to person, place, and time.     Cranial Nerves: No cranial nerve deficit.     Coordination: Coordination normal.  Deep Tendon Reflexes: Reflexes are normal and symmetric.   Psychiatric:        Judgment: Judgment normal.       CMP ( most recent) CMP     Component Value Date/Time   NA 135 04/02/2019 1225   K 5.3 (H) 04/02/2019 1225   CL 103 04/02/2019 1225   CO2 21 (L) 04/02/2019 1225   GLUCOSE 170 (H) 04/02/2019 1225   BUN 32 (H) 04/02/2019 1225   CREATININE 1.40 (H) 04/02/2019 1225   CALCIUM 10.3 04/02/2019 1225   PROT 6.7 03/20/2019 1658   ALBUMIN 3.5 03/20/2019 1658   AST 17 03/20/2019 1658   ALT 23 03/20/2019 1658   ALKPHOS 104 03/20/2019 1658   BILITOT 0.7 03/20/2019 1658   GFRNONAA 42 (L) 04/02/2019 1225   GFRAA 48 (L) 04/02/2019 1225     Diabetic Labs (most recent): Lab Results  Component Value Date   HGBA1C 7.8 (H) 03/20/2019   HGBA1C 7.5 (H) 04/29/2018   HGBA1C 6.7 (H) 05/14/2013     Assessment & Plan:   1. Diabetes mellitus with stage 3 chronic kidney disease ([place])   - [name] has currently uncontrolled symptomatic type 2 DM since  59 years of age,  with most recent A1c of 7.8 %.   She brought with her a log book showing fasting blood glucose profile ranging between 129-165, postprandial blood glucose ranges from 196-272. She did not document any hypoglycemia.  Her most recent A1c was 7.8%.  Recent labs reviewed. - I had a long discussion with her about the progressive nature of diabetes and the pathology behind its complications. -her diabetes is complicated by CHF, CKD, smoking and she remains at a high risk for more acute and chronic complications which include CAD, CVA, CKD, retinopathy, and neuropathy. These are all discussed in detail with her.  - I have counseled her on diet  and weight management  by adopting a carbohydrate restricted/protein rich diet. Patient is encouraged to switch to  unprocessed or minimally processed     complex starch and increased protein intake (animal or plant source), fruits, and vegetables. -  she is advised to stick to a routine mealtimes to eat 3 meals  a day and avoid  unnecessary snacks ( to snack only to correct hypoglycemia).   - she admits that there is a room for improvement in her food and drink choices. - Suggestion is made for her to avoid simple carbohydrates  from her diet including Cakes, Sweet Desserts, Ice Cream, Soda (diet and regular), Sweet Tea, Candies, Chips, Cookies, Store Bought Juices, Alcohol in Excess of  1-2 drinks a day, Artificial Sweeteners,  Coffee Creamer, and "Sugar-free" Products. This will help patient to have more stable blood glucose profile and potentially avoid unintended weight gain.  - she will be scheduled with Jearld Fenton, RDN, CDE for diabetes education.  - I have approached her with the following individualized plan to manage  her diabetes and patient agrees:   - she may need insulin treatment in order for her to achieve control of diabetes to target.  She does not tolerate metformin.   She is at work to start monitoring blood glucose four times a day -before meals and at bedtime, and return in 15 days for reevaluation.  - she is encouraged to call clinic for blood glucose levels less than 70 or above 300 mg /dl. -In the meantime she is advised to continue glipizide 5 mg p.o. twice daily with breakfast  and supper.  - she is not a candidate for SGLT2 inhibitors  due to concurrent renal insufficiency.  - she will be considered for incretin therapy as appropriate next visit.  - Specific targets for  A1c;  LDL, HDL,  and Triglycerides were discussed with the patient.  2) Blood Pressure /Hypertension:  her blood pressure is  controlled to target.   she is advised to continue her current medications including Entresto 24/26 mg p.o. daily with breakfast . 3) Lipids/Hyperlipidemia:   He does not have recent lipid panel today.  She is advised to continue atorvastatin 40 mg p.o. nightly.  Side effects and precautions discussed with her.  4)  Weight/Diet:  Body mass index is 30.07 kg/m.  -   clearly complicating her  diabetes care.   she is  a candidate for weight loss. I discussed with her the fact that loss of 5 - 10% of her  current body weight will have the most impact on her diabetes management.  Exercise, and detailed carbohydrates information provided  -  detailed on discharge instructions.  5) Chronic Care/Health Maintenance:  -she  is on ACEI/ARB and Statin medications and  is encouraged to initiate and continue to follow up with Ophthalmology, Dentist,  Podiatrist at least yearly or according to recommendations, and advised to  quit smoking. I have recommended yearly flu vaccine and pneumonia vaccine at least every 5 years; moderate intensity exercise for up to 150 minutes weekly; and  sleep for at least 7 hours a day.  - she is  advised to maintain close follow up with [name], MD for primary care needs, as well as her other providers for optimal and coordinated care.   - Time spent in this patient care: 60 min, of which > 50% was spent in  counseling  her about her currently uncontrolled type 2 diabetes, hyperlipidemia, hypertension and the rest reviewing her blood glucose logs , discussing her hypoglycemia and hyperglycemia episodes, reviewing her current and  previous labs / studies  ( including abstraction from other facilities) and medications  doses and developing a  long term treatment plan based on the latest standards of care/ guidelines; and documenting her care.    Please refer to Patient Instructions for Blood Glucose Monitoring and Insulin/Medications Dosing Guide"  in media tab for additional information. Please  also refer to " Patient Self Inventory" in the Media  tab for reviewed elements of pertinent patient history.  [name] participated in the discussions, expressed understanding, and voiced agreement with the above plans.  All questions were answered to her satisfaction. she is encouraged to contact clinic should she have any questions or concerns prior to her return  visit.   Follow up plan: - Return in about 2 weeks (around 05/26/2019), or office, for Follow up with Meter and Logs Only - no Labs.  Glade Lloyd, MD [facility] [address] [place], [place] [address] Phone: [phone]  Fax: [phone]    05/12/2019, 4:29 PM  This note was partially dictated with voice recognition software. Similar sounding words can be transcribed inadequately or may not  be corrected upon review.

## 2019-05-12 NOTE — Progress Notes (Signed)
  Medical Nutrition Therapy:  Appt start time: [id number] am end time:  1045   Assessment:  Primary concerns today: Diabetes Type 2, Walk in from Dr. Dorris Fetch. Will do safety questions next visit. Glipizide 5 mg.   Lab Results  Component Value Date   HGBA1C 7.8 (H) 03/20/2019   CMP Latest Ref Rng & Units 04/02/2019 03/24/2019 03/23/2019  Glucose 70 - 99 mg/dL 170(H) 160(H) -  BUN 6 - 20 mg/dL 32(H) 30(H) -  Creatinine 0.44 - 1.00 mg/dL 1.40(H) 1.22(H) 1.28(H)  Sodium 135 - 145 mmol/L 135 138 -  Potassium 3.5 - 5.1 mmol/L 5.3(H) 4.3 -  Chloride 98 - 111 mmol/L 103 101 -  CO2 22 - 32 mmol/L 21(L) 27 -  Calcium 8.9 - 10.3 mg/dL 10.3 9.3 -  Total Protein 6.5 - 8.1 g/dL - - -  Total Bilirubin 0.3 - 1.2 mg/dL - - -  Alkaline Phos 38 - 126 U/L - - -  AST 15 - 41 U/L - - -  ALT 0 - 44 U/L - - -     Preferred Learning Style:   Auditory  Visual  Hands on  No preference indicated   Learning Readiness:   Not ready  Contemplating  Ready  Change in progress   MEDICATIONS:   DIETARY INTAKE:  Eats 2-3 meals per day. Usual physical activity: ADL  Estimated energy needs: 1500 calories 170 g carbohydrates 112 g protein 42 g fat  Progress Towards Goal(s):  In progress.   Nutritional Diagnosis:  NB-1.1 Food and nutrition-related knowledge deficit As related to Diabetes Type 2.  As evidenced by A1C 7.8%..    Intervention:  Nutrition and Diabetes education provided on My Plate, CHO counting, meal planning, portion sizes, timing of meals, avoiding snacks between meals unless having a low blood sugar, target ranges for A1C and blood sugars, signs/symptoms and treatment of hyper/hypoglycemia, monitoring blood sugars, taking medications as prescribed, benefits of exercising 30 minutes per day and prevention of complications of DM. Goals  Follow My Plate  Eat meals on time Cut out snacks, sodas, diet sodas and sweets Increase lower carb vegetables Eat 2-3 carb choices per meal Drink  only water Cut out snacks.  Teaching Method Utilized:  Visual Auditory Hands on  Handouts given during visit include:  The Plate Method   Meal Plan Card  Diabetes Instructions.     Barriers to learning/adherence to lifestyle change: none  Demonstrated degree of understanding via:  Teach Back   Monitoring/Evaluation:  Dietary intake, exercise, , and body weight in 1 month(s).

## 2019-05-12 NOTE — Patient Instructions (Signed)
Goals  Follow My Plate  Eat meals on time Cut out snacks, sodas, diet sodas and sweets Increase lower carb vegetables Eat 2-3 carb choices per meal Drink only water Cut out snacks.

## 2019-05-21 LAB — BASIC METABOLIC PANEL
BUN/Creatinine Ratio: 16 (calc) (ref 6–22)
BUN: 18 mg/dL (ref 7–25)
CO2: 25 mmol/L (ref 20–32)
Calcium: 9.9 mg/dL (ref 8.6–10.4)
Chloride: 104 mmol/L (ref 98–110)
Creat: 1.1 mg/dL — ABNORMAL HIGH (ref 0.50–1.05)
Glucose, Bld: 181 mg/dL — ABNORMAL HIGH (ref 65–139)
Potassium: 4.6 mmol/L (ref 3.5–5.3)
Sodium: 139 mmol/L (ref 135–146)

## 2019-05-25 ENCOUNTER — Telehealth: Payer: Self-pay | Admitting: *Deleted

## 2019-05-25 NOTE — Telephone Encounter (Signed)
-----   Message from Satira Sark, MD sent at 05/22/2019  9:08 AM EDT ----- Results reviewed.  Renal function better with creatinine down to 1.1, potassium normal.  Continue with current medications and follow-up plan.

## 2019-05-25 NOTE — Telephone Encounter (Signed)
Patient informed. Copy sent to PCP °

## 2019-05-26 ENCOUNTER — Other Ambulatory Visit: Payer: Self-pay

## 2019-05-26 ENCOUNTER — Encounter: Payer: Self-pay | Admitting: "Endocrinology

## 2019-05-26 ENCOUNTER — Encounter: Payer: Medicaid Other | Attending: "Endocrinology | Admitting: Nutrition

## 2019-05-26 ENCOUNTER — Encounter: Payer: Self-pay | Admitting: Nutrition

## 2019-05-26 ENCOUNTER — Ambulatory Visit (INDEPENDENT_AMBULATORY_CARE_PROVIDER_SITE_OTHER): Payer: Medicaid Other | Admitting: "Endocrinology

## 2019-05-26 VITALS — Ht 67.0 in | Wt 188.0 lb

## 2019-05-26 VITALS — BP 99/67 | HR 67 | Ht 67.0 in | Wt 188.4 lb

## 2019-05-26 DIAGNOSIS — E1122 Type 2 diabetes mellitus with diabetic chronic kidney disease: Secondary | ICD-10-CM

## 2019-05-26 DIAGNOSIS — I10 Essential (primary) hypertension: Secondary | ICD-10-CM | POA: Insufficient documentation

## 2019-05-26 DIAGNOSIS — E782 Mixed hyperlipidemia: Secondary | ICD-10-CM

## 2019-05-26 DIAGNOSIS — N183 Chronic kidney disease, stage 3 unspecified: Secondary | ICD-10-CM | POA: Diagnosis not present

## 2019-05-26 NOTE — Patient Instructions (Addendum)
Goals  Increase low carb vegetalbs. Increase protein  And veggies with lunch Increase  High fiber foods; whole wheat bread, baked potato or dried bean, Make sure protein at every meal. Get A1C down to 7% Keep walking.

## 2019-05-26 NOTE — Progress Notes (Signed)
  Medical Nutrition Therapy:  Appt start time: 1035am end time:  1055   Assessment:  Primary concerns today: Diabetes Type 2,  FBS: 150-170's  Bedtime: 180-200's.  Cut out snacks, sodas, diet sodas and sweets Walking some 15 minutes. Taking. Glipizide 5 mg. She is working really hard on making better food choices. Doesn't get  Hungry at times and skips meals. BS are improved. A1C down to 7.8% from 8.3%   Lab Results  Component Value Date   HGBA1C 7.8 (H) 03/20/2019   CMP Latest Ref Rng & Units 05/21/2019 04/02/2019 03/24/2019  Glucose 65 - 139 mg/dL 181(H) 170(H) 160(H)  BUN 7 - 25 mg/dL 18 32(H) 30(H)  Creatinine 0.50 - 1.05 mg/dL 1.10(H) 1.40(H) 1.22(H)  Sodium 135 - 146 mmol/L 139 135 138  Potassium 3.5 - 5.3 mmol/L 4.6 5.3(H) 4.3  Chloride 98 - 110 mmol/L 104 103 101  CO2 20 - 32 mmol/L 25 21(L) 27  Calcium 8.6 - 10.4 mg/dL 9.9 10.3 9.3  Total Protein 6.5 - 8.1 g/dL - - -  Total Bilirubin 0.3 - 1.2 mg/dL - - -  Alkaline Phos 38 - 126 U/L - - -  AST 15 - 41 U/L - - -  ALT 0 - 44 U/L - - -     Preferred Learning Style:   Auditory  Visual  Hands on  No preference indicated   Learning Readiness:   Not ready  Contemplating  Ready  Change in progress   MEDICATIONS:   DIETARY INTAKE:   B) Crissant- ham and cheese, water Lunch; Glucerna Dinner: Biscuits, eggs, gravy, [place] sausage, water  Estimated energy needs: 1500 calories 170 g carbohydrates 112 g protein 42 g fat  Progress Towards Goal(s):  In progress.   Nutritional Diagnosis:  NB-1.1 Food and nutrition-related knowledge deficit As related to Diabetes Type 2.  As evidenced by A1C 7.8%..    Intervention:  Nutrition and Diabetes education provided on My Plate, CHO counting, meal planning, portion sizes, timing of meals, avoiding snacks between meals unless having a low blood sugar, target ranges for A1C and blood sugars, signs/symptoms and treatment of hyper/hypoglycemia, monitoring blood sugars,  taking medications as prescribed, benefits of exercising 30 minutes per day and prevention of complications of DM. Goals  Goals  Increase low carb vegetalbs. Increase protein  And veggies with lunch Increase  High fiber foods; whole wheat bread, baked potato or dried bean, Make sure protein at every meal. Get A1C down to 7% Keep walking.  Teaching Method Utilized:  Visual Auditory Hands on  Handouts given during visit include:  The Plate Method   Meal Plan Card  Diabetes Instructions.     Barriers to learning/adherence to lifestyle change: none  Demonstrated degree of understanding via:  Teach Back   Monitoring/Evaluation:  Dietary intake, exercise, , and body weight in 1 month(s).

## 2019-05-26 NOTE — Progress Notes (Signed)
05/26/2019, 12:38 PM  Endocrinology follow-up note   Subjective:    Patient ID: [name], female    DOB: 06-May-1960.  [name] is being seen in follow-up after she was seen in consultation for management of currently uncontrolled symptomatic diabetes requested by  [name], MD.   Past Medical History:  Diagnosis Date  . Anal fissure   . Anxiety   . Depression   . Essential hypertension   . GERD (gastroesophageal reflux disease)   . History of seizures as a child    None since age 20  . Mixed hyperlipidemia   . Nonischemic cardiomyopathy ([place])   . Type 2 diabetes mellitus ([place])     Past Surgical History:  Procedure Laterality Date  . BACK SURGERY  2005   lumbar disckectomy  . COLONOSCOPY  10/01/2011   Procedure: COLONOSCOPY;  Surgeon: [name], MD;  Location: [facility];  Service: Endoscopy;  Laterality: N/A;  730  . COLONOSCOPY N/A 10/04/2016   Procedure: COLONOSCOPY;  Surgeon: [name], MD;  Location: [facility];  Service: Endoscopy;  Laterality: N/A;  10:30  . DILATION AND CURETTAGE OF UTERUS    . FOOT BONE EXCISION     right  . POLYPECTOMY  10/04/2016   Procedure: POLYPECTOMY;  Surgeon: [name], MD;  Location: [facility];  Service: Endoscopy;;  colon  . RIGHT/LEFT HEART CATH AND CORONARY ANGIOGRAPHY N/A 03/23/2019   Procedure: RIGHT/LEFT HEART CATH AND CORONARY ANGIOGRAPHY;  Surgeon: [name], MD;  Location: [facility];  Service: Cardiovascular;  Laterality: N/A;    Social History   Socioeconomic History  . Marital status: Divorced    Spouse name: Not on file  . Number of children: Not on file  . Years of education: Not on file  . Highest education level: Not on file  Occupational History  . Not on file  Tobacco Use  . Smoking status: Current Every Day Smoker    Packs/day: 0.50    Years: 37.00    Pack years: 18.50    Types:  Cigarettes  . Smokeless tobacco: Never Used  . Tobacco comment: less than 1/2 pack per day  Substance and Sexual Activity  . Alcohol use: No  . Drug use: No  . Sexual activity: Yes    Birth control/protection: None  Other Topics Concern  . Not on file  Social History Narrative  . Not on file   Social Determinants of Health   Financial Resource Strain:   . Difficulty of Paying Living Expenses:   Food Insecurity:   . Worried About Charity fundraiser in the Last Year:   . Arboriculturist in the Last Year:   Transportation Needs:   . Film/video editor (Medical):   [name] Lack of Transportation (Non-Medical):   Physical Activity:   . Days of Exercise per Week:   . Minutes of Exercise per Session:   Stress:   . Feeling of Stress :   Social Connections:   . Frequency of Communication with Friends and Family:   . Frequency of Social Gatherings with Friends and Family:   . Attends Religious Services:   .  Active Member of Clubs or Organizations:   . Attends Archivist Meetings:   [name] Marital Status:     Family History  Problem Relation Age of Onset  . Arthritis Mother   . Hernia Mother   . Constipation Mother   . Colon cancer Neg Hx     Outpatient Encounter Medications as of 05/26/2019  Medication Sig  . ALPRAZolam (XANAX) 1 MG tablet Take 1 mg by mouth at bedtime.  [name] atorvastatin (LIPITOR) 40 MG tablet Take 40 mg by mouth daily.  . carvedilol (COREG) 6.25 MG tablet Take 1 tablet (6.25 mg total) by mouth 2 (two) times daily with a meal.  . Cholecalciferol (VITAMIN D3) 125 MCG (5000 UT) CAPS Take 1 capsule by mouth daily.  [name] dicyclomine (BENTYL) 10 MG capsule TAKE ONE CAPSULE BY MOUTH TWICE A DAY BEFORE A MEAL (Patient taking differently: Take 10 mg by mouth 3 (three) times daily before meals. )  . FLUoxetine (PROZAC) 20 MG capsule Take 20 mg by mouth every other day.   [name] glipiZIDE (GLUCOTROL) 5 MG tablet Take 5 mg by mouth 2 (two) times daily before a meal.   .  loratadine (CLARITIN) 10 MG tablet Take 10 mg by mouth daily.  [name] LORazepam (ATIVAN) 0.5 MG tablet Take 0.5 mg by mouth 3 (three) times daily.   . Melatonin (SM MELATONIN) 3 MG TABS Take 3 mg by mouth at bedtime.  . pantoprazole (PROTONIX) 40 MG tablet Take 1 tablet (40 mg total) by mouth daily.  . sacubitril-valsartan (ENTRESTO) 24-26 MG Take 1 tablet by mouth 2 (two) times daily.  [name] spironolactone (ALDACTONE) 25 MG tablet Take 0.5 tablets (12.5 mg total) by mouth daily.  . varenicline (CHANTIX STARTING MONTH PAK) 0.5 MG X 11 & 1 MG X 42 tablet Take one 0.5 mg tablet by mouth once daily for 3 days, then increase to one 0.5 mg tablet twice daily for 4 days, then increase to one 1 mg tablet twice daily.   No facility-administered encounter medications on file as of 05/26/2019.    ALLERGIES: No Known Allergies  VACCINATION STATUS:  There is no immunization history on file for this patient.  Diabetes She presents for her follow-up diabetic visit. She has type 2 diabetes mellitus. Onset time: She was diagnosed at approximate age of 46 years. Her disease course has been improving. There are no hypoglycemic associated symptoms. Pertinent negatives for hypoglycemia include no confusion, headaches, pallor or seizures. Associated symptoms include fatigue, polydipsia and polyuria. Pertinent negatives for diabetes include no chest pain and no polyphagia. There are no hypoglycemic complications. Symptoms are improving. Diabetic complications include heart disease and nephropathy. Risk factors for coronary artery disease include dyslipidemia, diabetes mellitus, obesity, sedentary lifestyle, post-menopausal and tobacco exposure. Current diabetic treatments: She is currently on glipizide 5 mg p.o. twice daily. Her weight is fluctuating minimally. She is following a generally unhealthy diet. When asked about meal planning, she reported none. She has not had a previous visit with a dietitian. She rarely participates  in exercise. Her home blood glucose trend is decreasing steadily. Her breakfast blood glucose range is generally 140-180 mg/dl. Her lunch blood glucose range is generally 180-200 mg/dl. Her dinner blood glucose range is generally 180-200 mg/dl. Her bedtime blood glucose range is generally 180-200 mg/dl. Her overall blood glucose range is 180-200 mg/dl. (She returns with her meter and logs showing significant improvement in her glycemic profile.  Her recent A1c was 7.8%.  She has no hypoglycemia.   )  An ACE inhibitor/angiotensin II receptor blocker is being taken. Eye exam is current.  Hyperlipidemia This is a chronic problem. The current episode started more than 1 year ago. Exacerbating diseases include chronic renal disease, diabetes and obesity. Pertinent negatives include no chest pain, myalgias or shortness of breath. Current antihyperlipidemic treatment includes statins. Risk factors for coronary artery disease include dyslipidemia, diabetes mellitus, obesity, a sedentary lifestyle and post-menopausal.  Hypertension This is a chronic problem. The current episode started more than 1 year ago. The problem is controlled. Pertinent negatives include no chest pain, headaches, palpitations or shortness of breath. Risk factors for coronary artery disease include diabetes mellitus, dyslipidemia, obesity, smoking/tobacco exposure, sedentary lifestyle and post-menopausal state. Past treatments include ACE inhibitors. Hypertensive end-organ damage includes kidney disease and heart failure. Identifiable causes of hypertension include chronic renal disease.     Review of Systems  Constitutional: Positive for fatigue. Negative for chills, fever and unexpected weight change.  HENT: Negative for trouble swallowing and voice change.   Eyes: Negative for visual disturbance.  Respiratory: Negative for cough, shortness of breath and wheezing.   Cardiovascular: Negative for chest pain, palpitations and leg swelling.   Gastrointestinal: Negative for diarrhea, nausea and vomiting.  Endocrine: Positive for polydipsia and polyuria. Negative for cold intolerance, heat intolerance and polyphagia.  Musculoskeletal: Negative for arthralgias and myalgias.  Skin: Negative for color change, pallor, rash and wound.  Neurological: Negative for seizures and headaches.  Psychiatric/Behavioral: Negative for confusion and suicidal ideas.    Objective:    Vitals with BMI 05/26/2019 05/26/2019 05/12/2019  Height 5\' 7"  5\' 7"  5\' 7"   Weight 188 lbs 188 lbs 6 oz 192 lbs  BMI 29.44 [id number]  Systolic - 99 [id number]  Diastolic - 67 71  Pulse - 67 73    BP 99/67   Pulse 67   Ht 5\' 7"  (1.702 m)   Wt 188 lb 6.4 oz (85.5 kg)   LMP 09/25/2011   BMI 29.51 kg/m   Wt Readings from Last 3 Encounters:  05/26/19 188 lb (85.3 kg)  05/26/19 188 lb 6.4 oz (85.5 kg)  05/12/19 192 lb (87.1 kg)     Physical Exam Constitutional:      Appearance: She is well-developed.  HENT:     Head: Normocephalic and atraumatic.  Neck:     Thyroid: No thyromegaly.     Trachea: No tracheal deviation.  Cardiovascular:     Rate and Rhythm: Normal rate and regular rhythm.  Pulmonary:     Effort: Pulmonary effort is normal.  Abdominal:     Tenderness: There is no abdominal tenderness. There is no guarding.  Musculoskeletal:        General: Normal range of motion.     Cervical back: Normal range of motion and neck supple.  Skin:    General: Skin is warm and dry.     Coloration: Skin is not pale.     Findings: No erythema or rash.  Neurological:     Mental Status: She is alert and oriented to person, place, and time.     Cranial Nerves: No cranial nerve deficit.     Coordination: Coordination normal.     Deep Tendon Reflexes: Reflexes are normal and symmetric.  Psychiatric:        Judgment: Judgment normal.     CMP ( most recent) CMP     Component Value Date/Time   NA 139 05/21/2019 0953   K 4.6 05/21/2019 0953   CL 104 05/21/2019  0953   CO2 25 05/21/2019 0953   GLUCOSE 181 (H) 05/21/2019 0953   BUN 18 05/21/2019 0953   CREATININE 1.10 (H) 05/21/2019 0953   CALCIUM 9.9 05/21/2019 0953   PROT 6.7 03/20/2019 1658   ALBUMIN 3.5 03/20/2019 1658   AST 17 03/20/2019 1658   ALT 23 03/20/2019 1658   ALKPHOS 104 03/20/2019 1658   BILITOT 0.7 03/20/2019 1658   GFRNONAA 42 (L) 04/02/2019 1225   GFRAA 48 (L) 04/02/2019 1225     Diabetic Labs (most recent): Lab Results  Component Value Date   HGBA1C 7.8 (H) 03/20/2019   HGBA1C 7.5 (H) 04/29/2018   HGBA1C 6.7 (H) 05/14/2013     Assessment & Plan:   1. Diabetes mellitus with stage 3 chronic kidney disease ([place])   - MADALYNE COLOSIMO has currently uncontrolled symptomatic type 2 DM since  59 years of age,  with most recent A1c of 7.8 %.   She returns with her meter and logs showing significant improvement in her glycemic profile.  Her recent A1c was 7.8%.  She has no hypoglycemia.    Recent labs reviewed. - I had a long discussion with her about the progressive nature of diabetes and the pathology behind its complications. -her diabetes is complicated by CHF, CKD, smoking and she remains at a high risk for more acute and chronic complications which include CAD, CVA, CKD, retinopathy, and neuropathy. These are all discussed in detail with her.  - I have counseled her on diet  and weight management  by adopting a carbohydrate restricted/protein rich diet. Patient is encouraged to switch to  unprocessed or minimally processed     complex starch and increased protein intake (animal or plant source), fruits, and vegetables. -  she is advised to stick to a routine mealtimes to eat 3 meals  a day and avoid unnecessary snacks ( to snack only to correct hypoglycemia).   - she  admits there is a room for improvement in her diet and drink choices. -  Suggestion is made for her to avoid simple carbohydrates  from her diet including Cakes, Sweet Desserts / Pastries, Ice Cream, Soda  (diet and regular), Sweet Tea, Candies, Chips, Cookies, Sweet Pastries,  Store Bought Juices, Alcohol in Excess of  1-2 drinks a day, Artificial Sweeteners, Coffee Creamer, and "Sugar-free" Products. This will help patient to have stable blood glucose profile and potentially avoid unintended weight gain.  - she will be scheduled with Jearld Fenton, RDN, CDE for diabetes education.  - I have approached her with the following individualized plan to manage  her diabetes and patient agrees:   -Based on her present issue with near target glycemic profile, she would not need insulin treatment for now.  She does not tolerate metformin.   she may need insulin treatment in order for her to achieve control of diabetes to target.  She does not tolerate metformin.  -She agrees to continue monitoring blood glucose twice daily-daily before breakfast and at bedtime.  - she is encouraged to call clinic for blood glucose levels less than 70 or above 200 mg /dl. -In the meantime she is advised to continue glipizide 5 mg p.o. twice daily with breakfast and supper.  - she is not a candidate for SGLT2 inhibitors  due to concurrent renal insufficiency.  - she will be considered for incretin therapy as appropriate next visit.  - Specific targets for  A1c;  LDL, HDL,  and Triglycerides were discussed with the patient.  2) Blood Pressure /Hypertension:  Her blood pressure is controlled to target. she is advised to continue her current medications including Entresto 24/26 mg p.o. daily with breakfast . 3) Lipids/Hyperlipidemia:   He does not have recent lipid panel today.  She is advised to continue atorvastatin 40 mg p.o. nightly.   Side effects and precautions discussed with her.  4)  Weight/Diet:  Body mass index is 29.51 kg/m.  -   clearly complicating her diabetes care.   she is  a candidate for weight loss. I discussed with her the fact that loss of 5 - 10% of her  current body weight will have the most impact  on her diabetes management.  Exercise, and detailed carbohydrates information provided  -  detailed on discharge instructions.  5) Chronic Care/Health Maintenance:  -she  is on ACEI/ARB and Statin medications and  is encouraged to initiate and continue to follow up with Ophthalmology, Dentist,  Podiatrist at least yearly or according to recommendations, and advised to  quit smoking. I have recommended yearly flu vaccine and pneumonia vaccine at least every 5 years; moderate intensity exercise for up to 150 minutes weekly; and  sleep for at least 7 hours a day.  - she is  advised to maintain close follow up with [name], MD for primary care needs, as well as her other providers for optimal and coordinated care.   - Time spent on this patient care encounter:  35 min, of which > 50% was spent in  counseling and the rest reviewing her blood glucose logs , discussing her hypoglycemia and hyperglycemia episodes, reviewing her current and  previous labs / studies  ( including abstraction from other facilities) and medications  doses and developing a  long term treatment plan and documenting her care.   Please refer to Patient Instructions for Blood Glucose Monitoring and Insulin/Medications Dosing Guide"  in media tab for additional information. Please  also refer to " Patient Self Inventory" in the Media  tab for reviewed elements of pertinent patient history.  Orbie Pyo participated in the discussions, expressed understanding, and voiced agreement with the above plans.  All questions were answered to her satisfaction. she is encouraged to contact clinic should she have any questions or concerns prior to her return visit.   Follow up plan: - Return in about 5 weeks (around 06/30/2019) for Bring Meter and Logs- A1c in Office.  Glade Lloyd, MD [facility] [address] [place], [place] [address] Phone: [phone]  Fax: [phone]     05/26/2019, 12:38 PM  This note was partially dictated with voice recognition software. Similar sounding words can be transcribed inadequately or may not  be corrected upon review.

## 2019-05-26 NOTE — Patient Instructions (Signed)

## 2019-05-27 ENCOUNTER — Telehealth: Payer: Self-pay

## 2019-05-27 NOTE — Telephone Encounter (Signed)
Pt lvm that she is confused on when to check her sugar, as Dr Dorris Fetch listed it 2 different ways  Is it Super time, or Bedtime

## 2019-05-27 NOTE — Telephone Encounter (Signed)
Left a message requesting a return call to the office. 

## 2019-05-28 ENCOUNTER — Other Ambulatory Visit: Payer: Self-pay

## 2019-05-28 ENCOUNTER — Encounter: Payer: Self-pay | Admitting: Student

## 2019-05-28 ENCOUNTER — Ambulatory Visit (INDEPENDENT_AMBULATORY_CARE_PROVIDER_SITE_OTHER): Payer: Medicaid Other | Admitting: Student

## 2019-05-28 VITALS — BP 110/68 | HR 82 | Temp 99.2°F | Ht 67.0 in | Wt 187.0 lb

## 2019-05-28 DIAGNOSIS — I5022 Chronic systolic (congestive) heart failure: Secondary | ICD-10-CM

## 2019-05-28 DIAGNOSIS — N183 Chronic kidney disease, stage 3 unspecified: Secondary | ICD-10-CM

## 2019-05-28 DIAGNOSIS — I428 Other cardiomyopathies: Secondary | ICD-10-CM

## 2019-05-28 DIAGNOSIS — E1165 Type 2 diabetes mellitus with hyperglycemia: Secondary | ICD-10-CM

## 2019-05-28 DIAGNOSIS — I10 Essential (primary) hypertension: Secondary | ICD-10-CM | POA: Diagnosis not present

## 2019-05-28 DIAGNOSIS — Z79899 Other long term (current) drug therapy: Secondary | ICD-10-CM

## 2019-05-28 MED ORDER — SPIRONOLACTONE 25 MG PO TABS: 25.0000 mg | ORAL_TABLET | Freq: Every day | ORAL | 3 refills | Status: DC

## 2019-05-28 MED ORDER — CARVEDILOL 6.25 MG PO TABS: 6.2500 mg | ORAL_TABLET | Freq: Two times a day (BID) | ORAL | 3 refills | Status: DC

## 2019-05-28 NOTE — Progress Notes (Signed)
Cardiology Office Note    Date:  05/28/2019   ID:  [name], DOB Jan 06, 1961, MRN [id number]  PCP:  [name], MD  Cardiologist: Rozann Lesches, MD    Chief Complaint  Patient presents with  . Follow-up    1 month visit    History of Present Illness:    [name] is a 59 y.o. female with past medical history of chronic systolic CHF/NICM (EF 0000000 by echo in 02/2019 with cath showing no significant CAD), HTN, Type 2 DM amd Stage 3 CKD who presents to the office today for 73-month follow-up.  She was last examined by Dr. Domenic Polite on 04/21/2019 and reported still having dyspnea on exertion but denied any associated chest pain or palpitations. BP was initially recorded at 88/57 but improved to 100/58 on recheck. Her Lasix dosing was reduced to as needed for weight gain greater than 2 pounds within 24 hours or 5 pounds within 1 week. She was continued on Coreg 6.25 mg twice daily, Entresto 24-26 mg twice daily and Spironolactone 12.5 mg daily.  Her medication regimen was not further titrated given soft BP.  She did have follow-up labs on 05/21/2019 with creatinine improved to 1.10 and K+ stable at 4.6.  In talking with the patient today, she reports overall doing well from a cardiac perspective since her last visit. She has been tracking daily weights at home and this has declined to 183 lbs on her scales (at 187 lbs on the office scales today). She denies any recent dyspnea on exertion, orthopnea, PND or lower extremity edema.  No reported chest pain or palpitations.  She has been monitoring sodium intake in her diet and no longer consumes sodas.  Reports her glucose levels have significantly improved and she is due for a repeat Hgb A1c in the coming weeks.    Past Medical History:  Diagnosis Date  . Anal fissure   . Anxiety   . Chronic systolic (congestive) heart failure (HCC)    a. EF 20-25% by echo in 02/2019 with cath showing no significant CAD  . Depression   .  Essential hypertension   . GERD (gastroesophageal reflux disease)   . History of seizures as a child    None since age 38  . Mixed hyperlipidemia   . Nonischemic cardiomyopathy ([place])   . Type 2 diabetes mellitus ([place])     Past Surgical History:  Procedure Laterality Date  . BACK SURGERY  2005   lumbar disckectomy  . COLONOSCOPY  10/01/2011   Procedure: COLONOSCOPY;  Surgeon: [name], MD;  [facility]: [facility];  Service: Endoscopy;  Laterality: N/A;  730  . COLONOSCOPY N/A 10/04/2016   Procedure: COLONOSCOPY;  Surgeon: [name], MD;  Location: [facility];  Service: Endoscopy;  Laterality: N/A;  10:30  . DILATION AND CURETTAGE OF UTERUS    . FOOT BONE EXCISION     right  . POLYPECTOMY  10/04/2016   Procedure: POLYPECTOMY;  Surgeon: [name], MD;  Location: [facility];  Service: Endoscopy;;  colon  . RIGHT/LEFT HEART CATH AND CORONARY ANGIOGRAPHY N/A 03/23/2019   Procedure: RIGHT/LEFT HEART CATH AND CORONARY ANGIOGRAPHY;  Surgeon: [name], MD;  Location: [facility];  Service: Cardiovascular;  Laterality: N/A;    Current Medications: Outpatient Medications Prior to Visit  Medication Sig Dispense Refill  . ALPRAZolam (XANAX) 1 MG tablet Take 1 mg by mouth at bedtime.    [name] atorvastatin (  LIPITOR) 40 MG tablet Take 40 mg by mouth daily.    . Cholecalciferol (VITAMIN D3) 125 MCG (5000 UT) CAPS Take 1 capsule by mouth daily.    [name] dicyclomine (BENTYL) 10 MG capsule TAKE ONE CAPSULE BY MOUTH TWICE A DAY BEFORE A MEAL (Patient taking differently: Take 10 mg by mouth 3 (three) times daily before meals. ) 60 capsule 2  . FLUoxetine (PROZAC) 20 MG capsule Take 20 mg by mouth every other day.     [name] glipiZIDE (GLUCOTROL) 5 MG tablet Take 5 mg by mouth 2 (two) times daily before a meal.     . loratadine (CLARITIN) 10 MG tablet Take 10 mg by mouth daily.    [name] LORazepam (ATIVAN) 0.5 MG tablet Take 0.5 mg by mouth 3 (three) times daily.     . Melatonin (SM  MELATONIN) 3 MG TABS Take 3 mg by mouth at bedtime.    . pantoprazole (PROTONIX) 40 MG tablet Take 1 tablet (40 mg total) by mouth daily. 30 tablet 0  . sacubitril-valsartan (ENTRESTO) 24-26 MG Take 1 tablet by mouth 2 (two) times daily. 60 tablet 6  . varenicline (CHANTIX STARTING MONTH PAK) 0.5 MG X 11 & 1 MG X 42 tablet Take one 0.5 mg tablet by mouth once daily for 3 days, then increase to one 0.5 mg tablet twice daily for 4 days, then increase to one 1 mg tablet twice daily. 53 tablet 0  . carvedilol (COREG) 6.25 MG tablet Take 1 tablet (6.25 mg total) by mouth 2 (two) times daily with a meal. 60 tablet 2  . spironolactone (ALDACTONE) 25 MG tablet Take 0.5 tablets (12.5 mg total) by mouth daily. 30 tablet 2   No facility-administered medications prior to visit.     Allergies:   Patient has no known allergies.   Social History   Socioeconomic History  . Marital status: Divorced    Spouse name: Not on file  . Number of children: Not on file  . Years of education: Not on file  . Highest education level: Not on file  Occupational History  . Not on file  Tobacco Use  . Smoking status: Current Every Day Smoker    Packs/day: 0.50    Years: 37.00    Pack years: 18.50    Types: Cigarettes  . Smokeless tobacco: Never Used  . Tobacco comment: less than 1/2 pack per day  Substance and Sexual Activity  . Alcohol use: No  . Drug use: No  . Sexual activity: Yes    Birth control/protection: None  Other Topics Concern  . Not on file  Social History Narrative  . Not on file   Social Determinants of Health   Financial Resource Strain:   . Difficulty of Paying Living Expenses:   Food Insecurity:   . Worried About Charity fundraiser in the Last Year:   . Arboriculturist in the Last Year:   Transportation Needs:   . Film/video editor (Medical):   [name] Lack of Transportation (Non-Medical):   Physical Activity:   . Days of Exercise per Week:   . Minutes of Exercise per Session:    Stress:   . Feeling of Stress :   Social Connections:   . Frequency of Communication with Friends and Family:   . Frequency of Social Gatherings with Friends and Family:   . Attends Religious Services:   . Active Member of Clubs or Organizations:   . Attends Archivist  Meetings:   [name] Marital Status:      Family History:  The patient's family history includes Arthritis in her mother; Constipation in her mother; Hernia in her mother. Father had CHF and underwent heart transplant.   Review of Systems:   Please see the history of present illness.     General:  No chills, fever, night sweats or weight changes.  Cardiovascular:  No chest pain, dyspnea on exertion, edema, orthopnea, palpitations, paroxysmal nocturnal dyspnea. Dermatological: No rash, lesions/masses Respiratory: No cough, dyspnea Urologic: No hematuria, dysuria Abdominal:   No nausea, vomiting, diarrhea, bright red blood per rectum, melena, or hematemesis Neurologic:  No visual changes, wkns, changes in mental status.  She denies any of the above.    All other systems reviewed and are otherwise negative except as noted above.   Physical Exam:    VS:  BP 110/68   Pulse 82   Temp 99.2 F (37.3 C)   Ht 5\' 7"  (1.702 m)   Wt 187 lb (84.8 kg)   LMP 09/25/2011   SpO2 96%   BMI 29.29 kg/m    General: Well developed, well nourished,female appearing in no acute distress. Head: Normocephalic, atraumatic, sclera non-icteric.  Neck: No carotid bruits. JVD not elevated.  Lungs: Respirations regular and unlabored, without wheezes or rales.  Heart: Regular rate and rhythm. No S3 or S4.  No murmur, no rubs, or gallops appreciated. Abdomen: Soft, non-tender, non-distended. No obvious abdominal masses. Msk:  Strength and tone appear normal for age. No obvious joint deformities or effusions. Extremities: No clubbing or cyanosis. No edema.  Distal pedal pulses are 2+ bilaterally. Neuro: Alert and oriented X 3. Moves  all extremities spontaneously. No focal deficits noted. Psych:  Responds to questions appropriately with a normal affect. Skin: No rashes or lesions noted  Wt Readings from Last 3 Encounters:  05/28/19 187 lb (84.8 kg)  05/26/19 188 lb (85.3 kg)  05/26/19 188 lb 6.4 oz (85.5 kg)      Studies/Labs Reviewed:   EKG:  EKG is not ordered today.    Recent Labs: 03/20/2019: ALT 23; B Natriuretic Peptide 464.0 03/24/2019: Hemoglobin 12.4; Platelets 260 05/21/2019: BUN 18; Creat 1.10; Potassium 4.6; Sodium 139   Lipid Panel No results found for: CHOL, TRIG, HDL, CHOLHDL, VLDL, LDLCALC, LDLDIRECT  Additional studies/ records that were reviewed today include:   Echocardiogram: 02/2019 IMPRESSIONS    1. Left ventricular ejection fraction, by visual estimation, is 20 to  25%. The left ventricle has severely decreased function. There is mildly  increased left ventricular hypertrophy.  2. Elevated left ventricular end-diastolic pressure.  3. Left ventricular diastolic parameters are consistent with Grade III  diastolic dysfunction (restrictive).  4. The left ventricle demonstrates global hypokinesis.  5. Global right ventricle has mildly reduced systolic function.The right  ventricular size is normal. No increase in right ventricular wall  thickness.  6. Left atrial size was normal.  7. Right atrial size was normal.  8. The mitral valve is grossly normal. Moderate mitral valve  regurgitation.  9. The tricuspid valve is grossly normal.  10. The tricuspid valve is grossly normal. Tricuspid valve regurgitation  is trivial.  11. The aortic valve is tricuspid. Aortic valve regurgitation is not  visualized. No evidence of aortic valve sclerosis or stenosis.  12. Pulmonic regurgitation is mild.  13. The pulmonic valve was grossly normal. Pulmonic valve regurgitation is  mild.  14. The inferior vena cava is normal in size with greater than 50%  respiratory variability, suggesting  right atrial pressure of 3 mmHg.   Cardiac Catheterization: 02/2019 1. Normal filling pressures.  2. Preserved cardiac output  3. No significant coronary disease.   Nonischemic cardiomyopathy, now well-compensated.    Assessment:    1. Chronic systolic heart failure ([place])   2. Nonischemic cardiomyopathy ([place])   3. Essential hypertension   4. Uncontrolled type 2 diabetes mellitus with hyperglycemia ([place])   5. Medication management   6. Stage 3 chronic kidney disease, unspecified whether stage 3a or 3b CKD      Plan:   In order of problems listed above:  1. Chronic Systolic CHF/NICM - she has a known reduced EF 20-25% by echo in 02/2019 with cath showing no significant CAD. - She denies any recent orthopnea, PND or lower extremity edema. Weight has been stable on her home scales and has actually declined by 3 pounds over the past several weeks. - she is currently on Coreg 6.25 mg twice daily, Entresto 24-26 mg twice daily and Spironolactone 12.5 mg daily. Given that her BP has improved with reducing Lasix to PRN dosing, will attempt to titrate Spironolactone to 25 mg daily. Repeat BMET in 2 weeks and nurse visit for BP check unless able to obtain a cuff in the interim. She is already scheduled for a repeat echocardiogram on 07/01/2019 and follow-up with Dr. Aundra Dubin at that time.   2. HTN - BP is at 110/68 during today's visit. Will attempt to titrate Spironolactone to 25 mg daily as outlined above. She does not have a blood pressure cuff at home and I will send a message to Social Work today to see if one can be mailed to the patient as she is unable to afford this. Will tentatively plan for a nurse visit in 2 weeks for a BP check which can be canceled if she does have a cuff by that time.   3. Type 2 DM - now followed by Endocrinology. Scheduled for repeat labs next month and she requests to have these performed at the time of her upcoming BMET. Will obtain repeat Hgb A1c and forward  to Dr. Dorris Fetch once available. Did not tolerate Metformin in the past and has remained on Glipizide. Given improvement in renal function, would consider initiation of SGLT2 inhibitor therapy pending results given her concurrent cardiomyopathy.   4. Stage 3 CKD - baseline creatinine 1.2 - 1.3. Stable at 1.10 when checked on 05/21/2019. Will repeat BMET in 2 weeks following medication adjustments.     Medication Adjustments/Labs and Tests Ordered: Current medicines are reviewed at length with the patient today.  Concerns regarding medicines are outlined above.  Medication changes, Labs and Tests ordered today are listed in the Patient Instructions below. Patient Instructions  Medication Instructions:  Your physician has recommended you make the following change in your medication:  Increase Spironolactone to 25 mg Daily   *If you need a refill on your cardiac medications before your next appointment, please call your pharmacy*   Lab Work: Your physician recommends that you return for lab work in: 2 Weeks ( 06-12-19)  If you have labs (blood work) drawn today and your tests are completely normal, you will receive your results only by: [name] MyChart Message (if you have MyChart) OR . A paper copy in the mail If you have any lab test that is abnormal or we need to change your treatment, we will call you to review the results.   Testing/Procedures: NONE    Follow-Up:  At [facility], you and your health needs are our priority.  As part of our continuing mission to provide you with exceptional heart care, we have created designated Provider Care Teams.  These Care Teams include your primary Cardiologist (physician) and Advanced Practice Providers (APPs -  Physician Assistants and Nurse Practitioners) who all work together to provide you with the care you need, when you need it.  We recommend signing up for the patient portal called "MyChart".  Sign up information is provided on this After Visit  Summary.  MyChart is used to connect with patients for Virtual Visits (Telemedicine).  Patients are able to view lab/test results, encounter notes, upcoming appointments, etc.  Non-urgent messages can be sent to your provider as well.   To learn more about what you can do with MyChart, go to [email or web address].    Your next appointment:    As Planned   The format for your next appointment:   In person   Provider:      Other Instructions Thank you for choosing [facility]!     Signed, Erma Heritage, PA-C  05/28/2019 5:15 PM    [place] [address] [place], [place] [address] Phone: [phone] Fax: [phone]

## 2019-05-28 NOTE — Patient Instructions (Signed)
Medication Instructions:  Your physician has recommended you make the following change in your medication:  Increase Spironolactone to 25 mg Daily   *If you need a refill on your cardiac medications before your next appointment, please call your pharmacy*   Lab Work: Your physician recommends that you return for lab work in: 2 Weeks ( 06-12-19)  If you have labs (blood work) drawn today and your tests are completely normal, you will receive your results only by: [name] MyChart Message (if you have MyChart) OR . A paper copy in the mail If you have any lab test that is abnormal or we need to change your treatment, we will call you to review the results.   Testing/Procedures: NONE    Follow-Up: At [facility], you and your health needs are our priority.  As part of our continuing mission to provide you with exceptional heart care, we have created designated Provider Care Teams.  These Care Teams include your primary Cardiologist (physician) and Advanced Practice Providers (APPs -  Physician Assistants and Nurse Practitioners) who all work together to provide you with the care you need, when you need it.  We recommend signing up for the patient portal called "MyChart".  Sign up information is provided on this After Visit Summary.  MyChart is used to connect with patients for Virtual Visits (Telemedicine).  Patients are able to view lab/test results, encounter notes, upcoming appointments, etc.  Non-urgent messages can be sent to your provider as well.   To learn more about what you can do with MyChart, go to [email or web address].    Your next appointment:    As Planned   The format for your next appointment:   In person   Provider:      Other Instructions Thank you for choosing [facility]!

## 2019-05-28 NOTE — Telephone Encounter (Signed)
Discussed with pt, understanding voiced. 

## 2019-05-29 ENCOUNTER — Telehealth: Payer: Self-pay | Admitting: Licensed Clinical Social Worker

## 2019-05-29 NOTE — Telephone Encounter (Signed)
CSW referred to assist patient with obtaining a BP cuff. CSW contacted patient to inform cuff will be delivered to home. Patient grateful for support and assistance. CSW available as needed. Jackie Jasmina Gendron, LCSW, CCSW-MCS [phone]  

## 2019-06-03 ENCOUNTER — Telehealth: Payer: Self-pay | Admitting: Licensed Clinical Social Worker

## 2019-06-03 NOTE — Telephone Encounter (Signed)
CSW reordered BP cuff due to technical challenge and will be shipped to patient's home.  CSW available as needed. Raquel Sarna, [place], [place]

## 2019-06-04 ENCOUNTER — Other Ambulatory Visit (HOSPITAL_COMMUNITY): Payer: Self-pay | Admitting: Family Medicine

## 2019-06-04 DIAGNOSIS — Z1231 Encounter for screening mammogram for malignant neoplasm of breast: Secondary | ICD-10-CM

## 2019-06-06 LAB — HEMOGLOBIN A1C
Hgb A1c MFr Bld: 8.1 % of total Hgb — ABNORMAL HIGH (ref <5.7)
Mean Plasma Glucose: 186 (calc)
eAG (mmol/L): 10.3 (calc)

## 2019-06-06 LAB — BASIC METABOLIC PANEL
BUN/Creatinine Ratio: 29 (calc) — ABNORMAL HIGH (ref 6–22)
BUN: 32 mg/dL — ABNORMAL HIGH (ref 7–25)
CO2: 26 mmol/L (ref 20–32)
Calcium: 10 mg/dL (ref 8.6–10.4)
Chloride: 101 mmol/L (ref 98–110)
Creat: 1.12 mg/dL — ABNORMAL HIGH (ref 0.50–1.05)
Glucose, Bld: 170 mg/dL — ABNORMAL HIGH (ref 65–99)
Potassium: 4.8 mmol/L (ref 3.5–5.3)
Sodium: 138 mmol/L (ref 135–146)

## 2019-06-11 ENCOUNTER — Ambulatory Visit (INDEPENDENT_AMBULATORY_CARE_PROVIDER_SITE_OTHER): Payer: Medicaid Other

## 2019-06-11 ENCOUNTER — Telehealth: Payer: Self-pay | Admitting: *Deleted

## 2019-06-11 ENCOUNTER — Other Ambulatory Visit: Payer: Self-pay

## 2019-06-11 VITALS — BP 122/72 | HR 71 | Ht 67.0 in | Wt 187.0 lb

## 2019-06-11 DIAGNOSIS — I10 Essential (primary) hypertension: Secondary | ICD-10-CM

## 2019-06-11 NOTE — Telephone Encounter (Signed)
Called patient with test results. No answer. Left message to call back.  

## 2019-06-11 NOTE — Telephone Encounter (Signed)
-----   Message from Erma Heritage, [place] sent at 06/07/2019  7:39 AM EDT ----- Please let the patient know her renal function and electrolytes remain stable. Hgb A1c elevated to 8.1 and glucose at 170 so will need to keep scheduled follow-up with Endocrinology. Please forward a copy of labs to [name], MD and Dr. Dorris Fetch.

## 2019-06-11 NOTE — Progress Notes (Signed)
Pt came in today for a bp check after spironolactone was increased to 25 mg daily during lov. She has no complaints. BP was a lot better.

## 2019-06-30 ENCOUNTER — Other Ambulatory Visit (HOSPITAL_COMMUNITY): Payer: Self-pay | Admitting: *Deleted

## 2019-06-30 DIAGNOSIS — I5022 Chronic systolic (congestive) heart failure: Secondary | ICD-10-CM

## 2019-06-30 NOTE — Progress Notes (Signed)
c 

## 2019-07-01 ENCOUNTER — Ambulatory Visit (HOSPITAL_COMMUNITY)
Admission: RE | Admit: 2019-07-01 | Discharge: 2019-07-01 | Disposition: A | Discharge status: Home / Self Care | Payer: Medicaid Other | Source: Ambulatory Visit | Attending: Cardiology | Admitting: Cardiology

## 2019-07-01 ENCOUNTER — Ambulatory Visit (HOSPITAL_BASED_OUTPATIENT_CLINIC_OR_DEPARTMENT_OTHER)
Admission: RE | Admit: 2019-07-01 | Discharge: 2019-07-01 | Disposition: A | Discharge status: Home / Self Care | Payer: Medicaid Other | Source: Ambulatory Visit | Attending: Family Medicine | Admitting: Family Medicine

## 2019-07-01 ENCOUNTER — Encounter (HOSPITAL_COMMUNITY): Payer: Self-pay | Admitting: Cardiology

## 2019-07-01 ENCOUNTER — Other Ambulatory Visit: Payer: Self-pay

## 2019-07-01 ENCOUNTER — Telehealth (HOSPITAL_COMMUNITY): Payer: Self-pay | Admitting: Pharmacist

## 2019-07-01 VITALS — BP 102/60 | HR 80 | Wt 183.0 lb

## 2019-07-01 DIAGNOSIS — I5022 Chronic systolic (congestive) heart failure: Secondary | ICD-10-CM | POA: Diagnosis not present

## 2019-07-01 DIAGNOSIS — E785 Hyperlipidemia, unspecified: Secondary | ICD-10-CM | POA: Diagnosis not present

## 2019-07-01 DIAGNOSIS — Z8249 Family history of ischemic heart disease and other diseases of the circulatory system: Secondary | ICD-10-CM | POA: Insufficient documentation

## 2019-07-01 DIAGNOSIS — Z7984 Long term (current) use of oral hypoglycemic drugs: Secondary | ICD-10-CM | POA: Insufficient documentation

## 2019-07-01 DIAGNOSIS — I2510 Atherosclerotic heart disease of native coronary artery without angina pectoris: Secondary | ICD-10-CM | POA: Diagnosis not present

## 2019-07-01 DIAGNOSIS — F17210 Nicotine dependence, cigarettes, uncomplicated: Secondary | ICD-10-CM | POA: Diagnosis not present

## 2019-07-01 DIAGNOSIS — Z8261 Family history of arthritis: Secondary | ICD-10-CM | POA: Insufficient documentation

## 2019-07-01 DIAGNOSIS — I110 Hypertensive heart disease with heart failure: Secondary | ICD-10-CM | POA: Insufficient documentation

## 2019-07-01 DIAGNOSIS — Z79899 Other long term (current) drug therapy: Secondary | ICD-10-CM | POA: Diagnosis not present

## 2019-07-01 DIAGNOSIS — E119 Type 2 diabetes mellitus without complications: Secondary | ICD-10-CM | POA: Diagnosis not present

## 2019-07-01 LAB — BASIC METABOLIC PANEL
Anion gap: 11 (ref 5–15)
BUN: 25 mg/dL — ABNORMAL HIGH (ref 6–20)
CO2: 22 mmol/L (ref 22–32)
Calcium: 10 mg/dL (ref 8.9–10.3)
Chloride: 106 mmol/L (ref 98–111)
Creatinine, Ser: 1.11 mg/dL — ABNORMAL HIGH (ref 0.44–1.00)
GFR calc Af Amer: >60 mL/min (ref >60)
GFR calc non Af Amer: 55 mL/min — ABNORMAL LOW (ref >60)
Glucose, Bld: 128 mg/dL — ABNORMAL HIGH (ref 70–99)
Potassium: 4.8 mmol/L (ref 3.5–5.1)
Sodium: 139 mmol/L (ref 135–145)

## 2019-07-01 MED ORDER — DAPAGLIFLOZIN PROPANEDIOL 10 MG PO TABS: 10.0000 mg | ORAL_TABLET | Freq: Every day | ORAL | 5 refills | Status: DC

## 2019-07-01 NOTE — Telephone Encounter (Signed)
Patient Advocate Encounter   Received notification from [facility] Medicaid that prior authorization for Wilder Glade is required.   PA submitted on [place] [employer] Confirmation #: [id number] W Recipient ID: [id number] R Status is pending   Will continue to follow.  Audry Riles, [name], BCPS, BCCP, CPP Heart Failure Clinic Pharmacist [phone]

## 2019-07-01 NOTE — Progress Notes (Signed)
  Echocardiogram 2D Echocardiogram has been performed.  [name] 07/01/2019, 11:49 AM

## 2019-07-01 NOTE — Progress Notes (Signed)
Pt will go locally for lab work in 10 days

## 2019-07-01 NOTE — Patient Instructions (Addendum)
START Farxiga 10mg  (1 tab) daily  Labs today and repeat in 10 days We will only contact you if something comes back abnormal or we need to make some changes. Otherwise no news is good news!  [place] garage code [id number]   Please f/u with Dr Domenic Polite in [place] in 6 weeks   You have been referred to Dr Lattie Corns for genetic cardiology counseling. You will get a call to schedule this appointment.    Your physician recommends that you schedule a follow-up appointment in: 3 months with Dr Aundra Dubin  [place] [address] code [id number]  Please call office at [phone] option 2 if you have any questions or concerns.   At the [facility], you and your health needs are our priority. As part of our continuing mission to provide you with exceptional heart care, we have created designated Provider Care Teams. These Care Teams include your primary Cardiologist (physician) and Advanced Practice Providers (APPs- Physician Assistants and Nurse Practitioners) who all work together to provide you with the care you need, when you need it.   You may see any of the following providers on your designated Care Team at your next follow up: [name] Dr Glori Bickers . Dr Loralie Champagne . Darrick Grinder, NP . Lyda Jester, PA . Audry Riles, [name]   Please be sure to bring in all your medications bottles to every appointment.

## 2019-07-02 ENCOUNTER — Encounter: Payer: Medicaid Other | Attending: "Endocrinology | Admitting: Nutrition

## 2019-07-02 ENCOUNTER — Encounter: Payer: Self-pay | Admitting: "Endocrinology

## 2019-07-02 ENCOUNTER — Encounter: Payer: Self-pay | Admitting: Nutrition

## 2019-07-02 ENCOUNTER — Ambulatory Visit (INDEPENDENT_AMBULATORY_CARE_PROVIDER_SITE_OTHER): Payer: Medicaid Other | Admitting: "Endocrinology

## 2019-07-02 VITALS — BP 91/63 | HR 71 | Ht 67.0 in | Wt 185.0 lb

## 2019-07-02 DIAGNOSIS — E782 Mixed hyperlipidemia: Secondary | ICD-10-CM | POA: Diagnosis not present

## 2019-07-02 DIAGNOSIS — N182 Chronic kidney disease, stage 2 (mild): Secondary | ICD-10-CM

## 2019-07-02 DIAGNOSIS — F17200 Nicotine dependence, unspecified, uncomplicated: Secondary | ICD-10-CM | POA: Diagnosis not present

## 2019-07-02 DIAGNOSIS — E1122 Type 2 diabetes mellitus with diabetic chronic kidney disease: Secondary | ICD-10-CM

## 2019-07-02 DIAGNOSIS — I10 Essential (primary) hypertension: Secondary | ICD-10-CM | POA: Diagnosis not present

## 2019-07-02 DIAGNOSIS — E669 Obesity, unspecified: Secondary | ICD-10-CM

## 2019-07-02 DIAGNOSIS — N183 Chronic kidney disease, stage 3 unspecified: Secondary | ICD-10-CM | POA: Diagnosis present

## 2019-07-02 NOTE — Patient Instructions (Signed)
  Goals Reduce Farixga to half a pill per Dr. Dorris Fetch. Think about starting insulin instead of  Taking [place].  fContinue taking Glipizide Increase low carb vegetalbs. Increase protein  And veggies Keep drinking water within the limits per cardiology. Make sure protein at every meal. Get A1C down to 7.5% Keep walking.

## 2019-07-02 NOTE — Progress Notes (Signed)
  Medical Nutrition Therapy:  Appt start time: 1030am end time:  1100  Assessment:  Primary concerns today: Diabetes Type 2,  A1C went up to 8.1%. Cardiologist put her on [place] yesterday. Has a history of CHF. She notes she has been working on eating better foods and more fresh fruits and vegetables. Is on food stamps and finances are limited. She also takes Glipizide 5 mg BID. FBS 140-160's. She appears very stressed about her health problems related to her heart. She is motivated to continue to make healthy choices. She will consider going on insulin instead of farxiga due to risk factors of [place].  Lab Results  Component Value Date   HGBA1C 8.1 (H) 06/05/2019   CMP Latest Ref Rng & Units 07/01/2019 06/05/2019 05/21/2019  Glucose 70 - 99 mg/dL 128(H) 170(H) 181(H)  BUN 6 - 20 mg/dL 25(H) 32(H) 18  Creatinine 0.44 - 1.00 mg/dL 1.11(H) 1.12(H) 1.10(H)  Sodium 135 - 145 mmol/L 139 138 139  Potassium 3.5 - 5.1 mmol/L 4.8 4.8 4.6  Chloride 98 - 111 mmol/L 106 101 104  CO2 22 - 32 mmol/L 22 26 25   Calcium 8.9 - 10.3 mg/dL 10.0 10.0 9.9  Total Protein 6.5 - 8.1 g/dL - - -  Total Bilirubin 0.3 - 1.2 mg/dL - - -  Alkaline Phos 38 - 126 U/L - - -  AST 15 - 41 U/L - - -  ALT 0 - 44 U/L - - -     Preferred Learning Style:    No preference indicated   Learning Readiness:   Ready  Change in progress   MEDICATIONS: see chart   DIETARY INTAKE:  B) sf blueberry muffin, unsweetened almond milk or Cup of yogurt and water or Eggs and toast. L)Toss salad and steak, small baked potato, water D) Meat and vegetables, water   Estimated energy needs: 1500 calories 170 g carbohydrates 112 g protein 42 g fat  Progress Towards Goal(s):  In progress.   Nutritional Diagnosis:  NB-1.1 Food and nutrition-related knowledge deficit As related to Diabetes Type 2.  As evidenced by A1C 7.8%..    Intervention:  Nutrition and Diabetes education provided on My Plate, CHO counting, meal  planning, portion sizes, timing of meals, avoiding snacks between meals unless having a low blood sugar, target ranges for A1C and blood sugars, signs/symptoms and treatment of hyper/hypoglycemia, monitoring blood sugars, taking medications as prescribed, benefits of exercising 30 minutes per day and prevention of complications of DM.  Goals Reduce Farixga to half a pill per Dr. Dorris Fetch.  Think about starting insulin instead of  Taking [place]. Continue taking Glipizide Increase low carb vegetalbs. Increase protein  And veggies Keep drinking water within the limits per cardiology. Make sure protein at every meal. Get A1C down to 7.5% Keep walking.  Teaching Method Utilized:  Visual Auditory Hands on  Handouts given during visit include:  The Plate Method   Meal Plan Card  Diabetes Instructions.     Barriers to learning/adherence to lifestyle change: none  Demonstrated degree of understanding via:  Teach Back   Monitoring/Evaluation:  Dietary intake, exercise, , and body weight in 3 month(s). May consider putting her on insulin and stop farxia as the risks outweigh the benefits.[name]

## 2019-07-02 NOTE — Telephone Encounter (Signed)
Advanced Heart Failure Patient Advocate Encounter  Prior Authorization for Wilder Glade has been approved.    PA# [id number] Effective dates: 07/01/2019 - 06/30/2020  Patients co-pay is $3.00  Audry Riles, [name], BCPS, BCCP, CPP Heart Failure Clinic Pharmacist [phone]

## 2019-07-02 NOTE — Progress Notes (Signed)
Advanced Heart Failure Clinic Note   Referring Physician: PCP: [name], MD PCP-Cardiologist: Rozann Lesches, MD  HF Cardiology: Dr. Aundra Dubin   HPI:  59 y.o. female w/ history of DM, HTN, hyperlipidemia, and smoking.  She was admitted on 1/22 for about 1 week of shortness of breath. She had been started on azithromycin and prednisone by PCP and was taking inhalers for ?COPD exacerbation without relief.  COVID negative.  Dyspnea primarily exertional, no orthopnea. No chest pain reported.  In ER at [facility], pro-BNP was elevated and CXR suggestive of volume overload. Slightly elevated HS-TnI with no trend, not suggestive of ACS.  She was admitted for management of new diagnosis of CHF and seen by Dr. Domenic Polite.  Echo was done, showed EF 20-25% with mild RV dysfunction. She was started on IV Lasix for diuresed then transitioned to PO.  She was transferred from [facility] to [facility] for RHC/LHC to assess filling pressures/CO and also to assess for coronary disease as etiology.   Of note, her father developed CHF in his 50s, nonischemic cardiomyopathy.  He had ICD and later heart transplant.  Grandmother also had CHF.   Cath was done 1/25 and showed no significant coronary disease and optimized filling pressures with preserved cardiac output (see angiographic and hemodynamic data below). Diagnosed w/ NICM. cMRI showed no myocardial LGE, so no definitive evidence for prior MI, infiltrative disease, or cardiomyopathy. RV normal w/ EF 35%. She was started on GDMT w/ ARB, ? blocker and MRA + loop diuretic. Actos for DM was discontinued w/ plan to consider eventual SGLT2i in the future.   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   Echo was done today, EF 30-35% (closer to 35%), diffuse hypokinesis, mildly decreased RV systolic function, normal IVC.   She presents for followup of CHF.  She is doing well, no dyspnea walking on flat ground.  No orthopnea/PND.  No chest pain.  SBP in  90s-100s on her home readings, but she denies lightheadedness or falls. Weight is down 5 lbs.  Still smoking, but has cut back to 2-3 cigarettes/day with use of Chantix.    PMH: 1. Hyperlipidemia 2. Type 2 diabetes.  3. Chronic systolic CHF:  - Echo ([id number]).  EF 20-25%, mildly decreased RV function.  - RHC/LHC (1/21): Nonobstructive CAD; mean RA 9, PA 28/5, mean PCWP 9, CI 2.37 - Cardiac MRI (1/21): LV EF 31%, RVEF 28%, No myocardial LGE.  - Echo (5/21): EF 30-35% (closer to 35%), diffuse hypokinesis, mildly decreased RV systolic function, normal IVC. - [employer] gene testing with FHL1 mutation of uncertain significance.   Review of Systems: All systems reviewed and negative except as per HPI.   Current Outpatient Medications  Medication Sig Dispense Refill  . ALPRAZolam (XANAX) 1 MG tablet Take 1 mg by mouth at bedtime.    [name] atorvastatin (LIPITOR) 40 MG tablet Take 40 mg by mouth daily.    . carvedilol (COREG) 6.25 MG tablet Take 1 tablet (6.25 mg total) by mouth 2 (two) times daily with a meal. 180 tablet 3  . Cholecalciferol (VITAMIN D3) 125 MCG (5000 UT) CAPS Take 1 capsule by mouth daily.    . cholestyramine (QUESTRAN) 4 g packet TAKE ONE PACKET (4 GRAMS) MIXED WITH 6 OUNCES OF LIQUID ONCE DAILY FOR DIARRHEA. MAY INCREASE TO 1 PACKET TWICE A DAY IF NEEDED.    [name] FLUoxetine (PROZAC) 20 MG capsule Take 20 mg by mouth every other day.     . furosemide (  LASIX) 20 MG tablet Take 20 mg by mouth daily as needed for fluid or edema.    [name] glipiZIDE (GLUCOTROL) 5 MG tablet Take 5 mg by mouth 2 (two) times daily before a meal.     . loperamide (IMODIUM) 2 MG capsule TAKE 2 CAPSULES BY MOUTH INITIALLY, THEN TAKE 1 CAPSULE AFTER EACH LOOSE STOOL. DO NOT EXCEED 8 CAPSULES IN 24 HOURS.    [name] loratadine (CLARITIN) 10 MG tablet Take 10 mg by mouth daily.    [name] LORazepam (ATIVAN) 0.5 MG tablet Take 0.5 mg by mouth 3 (three) times daily.     . Melatonin (SM MELATONIN) 3 MG TABS Take 3 mg by mouth at bedtime.     . pantoprazole (PROTONIX) 40 MG tablet Take 1 tablet (40 mg total) by mouth daily. 30 tablet 0  . Potassium Chloride ER 20 MEQ TBCR Take 20 mg by mouth daily as needed.    . sacubitril-valsartan (ENTRESTO) 24-26 MG Take 1 tablet by mouth 2 (two) times daily. 60 tablet 6  . spironolactone (ALDACTONE) 25 MG tablet Take 1 tablet (25 mg total) by mouth daily. 90 tablet 3  . varenicline (CHANTIX STARTING MONTH PAK) 0.5 MG X 11 & 1 MG X 42 tablet Take one 0.5 mg tablet by mouth once daily for 3 days, then increase to one 0.5 mg tablet twice daily for 4 days, then increase to one 1 mg tablet twice daily. 53 tablet 0  . dapagliflozin propanediol (FARXIGA) 10 MG TABS tablet Take 10 mg by mouth daily before breakfast. 30 tablet 5   No current facility-administered medications for this encounter.    No Known Allergies    Social History   Socioeconomic History  . Marital status: Divorced    Spouse name: Not on file  . Number of children: Not on file  . Years of education: Not on file  . Highest education level: Not on file  Occupational History  . Not on file  Tobacco Use  . Smoking status: Current Every Day Smoker    Packs/day: 0.50    Years: 37.00    Pack years: 18.50    Types: Cigarettes  . Smokeless tobacco: Never Used  . Tobacco comment: less than 1/2 pack per day  Substance and Sexual Activity  . Alcohol use: No  . Drug use: No  . Sexual activity: Yes    Birth control/protection: None  Other Topics Concern  . Not on file  Social History Narrative  . Not on file   Social Determinants of Health   Financial Resource Strain:   . Difficulty of Paying Living Expenses:   Food Insecurity:   . Worried About Charity fundraiser in the Last Year:   . Arboriculturist in the Last Year:   Transportation Needs:   . Film/video editor (Medical):   [name] Lack of Transportation (Non-Medical):   Physical Activity:   . Days of Exercise per Week:   . Minutes of Exercise per Session:     Stress:   . Feeling of Stress :   Social Connections:   . Frequency of Communication with Friends and Family:   . Frequency of Social Gatherings with Friends and Family:   . Attends Religious Services:   . Active Member of Clubs or Organizations:   . Attends Archivist Meetings:   [name] Marital Status:   Intimate Partner Violence:   . Fear of Current or Ex-Partner:   . Emotionally Abused:   .  Physically Abused:   . Sexually Abused:       Family History  Problem Relation Age of Onset  . Arthritis Mother   . Hernia Mother   . Constipation Mother   . Heart failure Father        Underwent heart transplant  . Colon cancer Neg Hx     Vitals:   07/01/19 1204  BP: 102/60  Pulse: 80  SpO2: 97%  Weight: 83 kg (183 lb)     PHYSICAL EXAM: General: NAD Neck: No JVD, no thyromegaly or thyroid nodule.  Lungs: Clear to auscultation bilaterally with normal respiratory effort. CV: Nondisplaced PMI.  Heart regular S1/S2, no S3/S4, no murmur.  No peripheral edema.  No carotid bruit.  Normal pedal pulses.  Abdomen: Soft, nontender, no hepatosplenomegaly, no distention.  Skin: Intact without lesions or rashes.  Neurologic: Alert and oriented x 3.  Psych: Normal affect. Extremities: No clubbing or cyanosis.  HEENT: Normal.    ASSESSMENT & PLAN:  1. Chronic Systolic CHF: Echo [id number] w/ EF 20-25%, mild RV dysfunction. No prior cardiac history but father has history of nonischemic cardiomyopathy withheart transplant and paternal grandmother also had CHF.  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 cMRi showed no myocardial LGE, so no definitive evidence for prior MI, infiltrative disease, or cardiomyopathy.  Echo done today showed mild improvement in LV function, EF 30-35% (probably closer to 35%).  She is not volume overloaded, NYHA class 2  symptoms.  SBP in 90s-100s.  - Continue Entresto 24-26 bid, will not increase with relatively low BP.  - Using Lasix prn.  - Continue Spironolactone 25 mg daily  - Continue Coreg 6.25 mg bid, will not increase with relatively low BP.  - Start dapagliflozin 10 mg daily.  - Check BMP today and again 10 days (repeat BMP can be done in [place]) - EF < 35% but with some improvement.  I will have her get an echo again in 6 months, and if EF not > 35, will refer for ICD (not CRT candidate).   - Discussed continuation of low sodium diet and daily wts - I will try to get her a virtual visit with Dr. Broadus John to discuss the [facility] gene mutation found in the setting of father and paternal grandmother both with cardiomyopathies.  2. Smoking: She has almost quit.  - Refill Chantix.  3. HTN: BP now low.  4. Type 2 diabetes: As above, adding dapagliflozion.   She will see Dr. Domenic Polite in [place] in 6 wks for medication titration, and I will see her back in 3 months. Echo in 6 months again.    Loralie Champagne, MD 07/02/19

## 2019-07-02 NOTE — Patient Instructions (Signed)

## 2019-07-02 NOTE — Progress Notes (Signed)
07/02/2019, 5:56 PM  Endocrinology follow-up note   Subjective:    Patient ID: [name], female    DOB: 09/17/1960.  [name] is being seen in follow-up after she was seen in consultation for management of currently uncontrolled symptomatic diabetes requested by  Mckenzie Evens, MD.   Past Medical History:  Diagnosis Date  . Anal fissure   . Anxiety   . Chronic systolic (congestive) heart failure (HCC)    a. EF 20-25% by echo in 02/2019 with cath showing no significant CAD  . Depression   . Essential hypertension   . GERD (gastroesophageal reflux disease)   . History of seizures as a child    None since age 59  . Mixed hyperlipidemia   . Nonischemic cardiomyopathy ([place])   . Type 2 diabetes mellitus ([place])     Past Surgical History:  Procedure Laterality Date  . BACK SURGERY  2005   lumbar disckectomy  . COLONOSCOPY  10/01/2011   Procedure: COLONOSCOPY;  Surgeon: Mckenzie Houston, MD;  [facility];  Service: Endoscopy;  Laterality: N/A;  730  . COLONOSCOPY N/A 10/04/2016   Procedure: COLONOSCOPY;  Surgeon: Mckenzie Houston, MD;  [facility];  Service: Endoscopy;  Laterality: N/A;  10:30  . DILATION AND CURETTAGE OF UTERUS    . FOOT BONE EXCISION     right  . POLYPECTOMY  10/04/2016   Procedure: POLYPECTOMY;  Surgeon: Mckenzie Houston, MD;  [facility]: [facility];  Service: Endoscopy;;  colon  . RIGHT/LEFT HEART CATH AND CORONARY ANGIOGRAPHY N/A 03/23/2019   Procedure: RIGHT/LEFT HEART CATH AND CORONARY ANGIOGRAPHY;  Surgeon: Mckenzie Dresser, MD;  Location: [facility];  Service: Cardiovascular;  Laterality: N/A;    Social History   Socioeconomic History  . Marital status: Divorced    Spouse name: Not on file  . Number of children: Not on file  . Years of education: Not on file  . Highest education level: Not on file  Occupational History  . Not on file  Tobacco Use   . Smoking status: Current Every Day Smoker    Packs/day: 0.50    Years: 37.00    Pack years: 18.50    Types: Cigarettes  . Smokeless tobacco: Never Used  . Tobacco comment: less than 1/2 pack per day  Substance and Sexual Activity  . Alcohol use: No  . Drug use: No  . Sexual activity: Yes    Birth control/protection: None  Other Topics Concern  . Not on file  Social History Narrative  . Not on file   Social Determinants of Health   Financial Resource Strain:   . Difficulty of Paying Living Expenses:   Food Insecurity:   . Worried About Charity fundraiser in the Last Year:   . Arboriculturist in the Last Year:   Transportation Needs:   . Film/video editor (Medical):   [name] Lack of Transportation (Non-Medical):   Physical Activity:   . Days of Exercise per Week:   . Minutes of Exercise per Session:   Stress:   . Feeling of Stress :   Social Connections:   . Frequency  of Communication with Friends and Family:   . Frequency of Social Gatherings with Friends and Family:   . Attends Religious Services:   . Active Member of Clubs or Organizations:   . Attends Archivist Meetings:   [name] Marital Status:     Family History  Problem Relation Age of Onset  . Arthritis Mother   . Hernia Mother   . Constipation Mother   . Heart failure Father        Underwent heart transplant  . Colon cancer Neg Hx     Outpatient Encounter Medications as of 07/02/2019  Medication Sig  . ALPRAZolam (XANAX) 1 MG tablet Take 1 mg by mouth at bedtime.  [name] atorvastatin (LIPITOR) 40 MG tablet Take 40 mg by mouth daily.  . carvedilol (COREG) 6.25 MG tablet Take 1 tablet (6.25 mg total) by mouth 2 (two) times daily with a meal.  . Cholecalciferol (VITAMIN D3) 125 MCG (5000 UT) CAPS Take 1 capsule by mouth daily.  . cholestyramine (QUESTRAN) 4 g packet TAKE ONE PACKET (4 GRAMS) MIXED WITH 6 OUNCES OF LIQUID ONCE DAILY FOR DIARRHEA. MAY INCREASE TO 1 PACKET TWICE A DAY IF NEEDED.  [name]  FLUoxetine (PROZAC) 20 MG capsule Take 20 mg by mouth every other day.   . furosemide (LASIX) 20 MG tablet Take 20 mg by mouth daily as needed for fluid or edema.  [name] glipiZIDE (GLUCOTROL) 5 MG tablet Take 5 mg by mouth 2 (two) times daily before a meal.   . loperamide (IMODIUM) 2 MG capsule TAKE 2 CAPSULES BY MOUTH INITIALLY, THEN TAKE 1 CAPSULE AFTER EACH LOOSE STOOL. DO NOT EXCEED 8 CAPSULES IN 24 HOURS.  [name] loratadine (CLARITIN) 10 MG tablet Take 10 mg by mouth daily.  [name] LORazepam (ATIVAN) 0.5 MG tablet Take 0.5 mg by mouth 3 (three) times daily.   . Melatonin (SM MELATONIN) 3 MG TABS Take 3 mg by mouth at bedtime.  . pantoprazole (PROTONIX) 40 MG tablet Take 1 tablet (40 mg total) by mouth daily.  . Potassium Chloride ER 20 MEQ TBCR Take 20 mg by mouth daily as needed.  . sacubitril-valsartan (ENTRESTO) 24-26 MG Take 1 tablet by mouth 2 (two) times daily.  [name] spironolactone (ALDACTONE) 25 MG tablet Take 1 tablet (25 mg total) by mouth daily.  . varenicline (CHANTIX STARTING MONTH PAK) 0.5 MG X 11 & 1 MG X 42 tablet Take one 0.5 mg tablet by mouth once daily for 3 days, then increase to one 0.5 mg tablet twice daily for 4 days, then increase to one 1 mg tablet twice daily.  . [DISCONTINUED] dapagliflozin propanediol (FARXIGA) 10 MG TABS tablet Take 10 mg by mouth daily before breakfast.   No facility-administered encounter medications on file as of 07/02/2019.    ALLERGIES: No Known Allergies  VACCINATION STATUS:  There is no immunization history on file for this patient.  Diabetes She presents for her follow-up diabetic visit. She has type 2 diabetes mellitus. Onset time: She was diagnosed at approximate age of 59 years. Her disease course has been improving. There are no hypoglycemic associated symptoms. Pertinent negatives for hypoglycemia include no confusion, headaches, pallor or seizures. Associated symptoms include fatigue, polydipsia and polyuria. Pertinent negatives for diabetes  include no chest pain and no polyphagia. There are no hypoglycemic complications. Symptoms are improving. Diabetic complications include heart disease and nephropathy. Risk factors for coronary artery disease include dyslipidemia, diabetes mellitus, obesity, sedentary lifestyle, post-menopausal and tobacco exposure. Current diabetic treatments: She  reports that recently her diabetes regimen has been completely changed .  Instead of her glipizide currently taking Farxiga 10 mg p.o. daily. Her weight is fluctuating minimally. She is following a generally unhealthy diet. When asked about meal planning, she reported none. She has not had a previous visit with a dietitian. She rarely participates in exercise. Her home blood glucose trend is increasing steadily. Her breakfast blood glucose range is generally 140-180 mg/dl. Her lunch blood glucose range is generally 180-200 mg/dl. Her dinner blood glucose range is generally 180-200 mg/dl. Her bedtime blood glucose range is generally 180-200 mg/dl. Her overall blood glucose range is 180-200 mg/dl. (She returns with her meter and logs showing slightly above target glycemic profile and her point-of-care A1c is 8.1%, increasing from 7.8%.  She denies any hypoglycemia.    ) An ACE inhibitor/angiotensin II receptor blocker is being taken. Eye exam is current.  Hyperlipidemia This is a chronic problem. The current episode started more than 1 year ago. Exacerbating diseases include chronic renal disease, diabetes and obesity. Pertinent negatives include no chest pain, myalgias or shortness of breath. Current antihyperlipidemic treatment includes statins. Risk factors for coronary artery disease include dyslipidemia, diabetes mellitus, obesity, a sedentary lifestyle and post-menopausal.  Hypertension This is a chronic problem. The current episode started more than 1 year ago. The problem is controlled. Pertinent negatives include no chest pain, headaches, palpitations or  shortness of breath. Risk factors for coronary artery disease include diabetes mellitus, dyslipidemia, obesity, smoking/tobacco exposure, sedentary lifestyle and post-menopausal state. Past treatments include ACE inhibitors. Hypertensive end-organ damage includes kidney disease and heart failure. Identifiable causes of hypertension include chronic renal disease.    Review of Systems  Constitutional: Positive for fatigue. Negative for chills, fever and unexpected weight change.  HENT: Negative for trouble swallowing and voice change.   Eyes: Negative for visual disturbance.  Respiratory: Negative for cough, shortness of breath and wheezing.   Cardiovascular: Negative for chest pain, palpitations and leg swelling.  Gastrointestinal: Negative for diarrhea, nausea and vomiting.  Endocrine: Positive for polydipsia and polyuria. Negative for cold intolerance, heat intolerance and polyphagia.  Musculoskeletal: Negative for arthralgias and myalgias.  Skin: Negative for color change, pallor, rash and wound.  Neurological: Negative for seizures and headaches.  Psychiatric/Behavioral: Negative for confusion and suicidal ideas.    Objective:    Vitals with BMI 07/02/2019 07/01/2019 06/11/2019  Height 5\' 7"  - 5\' 7"   Weight 185 lbs 183 lbs 187 lbs  BMI [id number] - [id number]  Systolic 91 [id number]  Diastolic 63 60 72  Pulse 71 80 71    BP 91/63   Pulse 71   Ht 5\' 7"  (1.702 m)   Wt 185 lb (83.9 kg)   LMP 09/25/2011   BMI 28.98 kg/m   Wt Readings from Last 3 Encounters:  07/02/19 185 lb (83.9 kg)  07/01/19 183 lb (83 kg)  06/11/19 187 lb (84.8 kg)     Physical Exam Constitutional:      Appearance: She is well-developed.  HENT:     Head: Normocephalic and atraumatic.  Neck:     Thyroid: No thyromegaly.     Trachea: No tracheal deviation.  Cardiovascular:     Rate and Rhythm: Normal rate and regular rhythm.  Pulmonary:     Effort: Pulmonary effort is normal.  Abdominal:     Tenderness: There is  no abdominal tenderness. There is no guarding.  Musculoskeletal:        General: Normal range of motion.  Cervical back: Normal range of motion and neck supple.  Skin:    General: Skin is warm and dry.     Coloration: Skin is not pale.     Findings: No erythema or rash.  Neurological:     Mental Status: She is alert and oriented to person, place, and time.     Cranial Nerves: No cranial nerve deficit.     Coordination: Coordination normal.     Deep Tendon Reflexes: Reflexes are normal and symmetric.  Psychiatric:        Judgment: Judgment normal.     CMP ( most recent) CMP     Component Value Date/Time   NA 139 07/01/2019 1255   K 4.8 07/01/2019 1255   CL 106 07/01/2019 1255   CO2 22 07/01/2019 1255   GLUCOSE 128 (H) 07/01/2019 1255   BUN 25 (H) 07/01/2019 1255   CREATININE 1.11 (H) 07/01/2019 1255   CREATININE 1.12 (H) 06/05/2019 0710   CALCIUM 10.0 07/01/2019 1255   PROT 6.7 03/20/2019 1658   ALBUMIN 3.5 03/20/2019 1658   AST 17 03/20/2019 1658   ALT 23 03/20/2019 1658   ALKPHOS 104 03/20/2019 1658   BILITOT 0.7 03/20/2019 1658   GFRNONAA 55 (L) 07/01/2019 1255   GFRAA >60 07/01/2019 1255     Diabetic Labs (most recent): Lab Results  Component Value Date   HGBA1C 8.1 (H) 06/05/2019   HGBA1C 7.8 (H) 03/20/2019   HGBA1C 7.5 (H) 04/29/2018    Assessment & Plan:   1. Diabetes mellitus with stage 3 chronic kidney disease ([place])   - ROSANGELA GARRETTE has currently uncontrolled symptomatic type 2 DM since  59 years of age.  She returns with her meter and logs showing slightly above target glycemic profile and her point-of-care A1c is 8.1%, increasing from 7.8%.  She denies any hypoglycemia.     Recent labs reviewed. - I had a long discussion with her about the progressive nature of diabetes and the pathology behind its complications. -her diabetes is complicated by CHF, CKD stage 2-3 , smoking and she remains at a high risk for more acute and chronic  complications which include CAD, CVA, CKD, retinopathy, and neuropathy. These are all discussed in detail with her.  - I have counseled her on diet  and weight management  by adopting a carbohydrate restricted/protein rich diet. Patient is encouraged to switch to  unprocessed or minimally processed     complex starch and increased protein intake (animal or plant source), fruits, and vegetables. -  she is advised to stick to a routine mealtimes to eat 3 meals  a day and avoid unnecessary snacks ( to snack only to correct hypoglycemia).    - she  admits there is a room for improvement in her diet and drink choices. -  Suggestion is made for her to avoid simple carbohydrates  from her diet including Cakes, Sweet Desserts / Pastries, Ice Cream, Soda (diet and regular), Sweet Tea, Candies, Chips, Cookies, Sweet Pastries,  Store Bought Juices, Alcohol in Excess of  1-2 drinks a day, Artificial Sweeteners, Coffee Creamer, and "Sugar-free" Products. This will help patient to have stable blood glucose profile and potentially avoid unintended weight gain.   - she will be scheduled with Jearld Fenton, RDN, CDE for diabetes education.  - I have approached her with the following individualized plan to manage  her diabetes and patient agrees:   -The best choice of therapy for her diabetes would  have been basal  insulin, however patient wishes to avoid this intervention.  Due to her hesitance, she was given glipizide 5 mg p.o. twice daily.  She did not tolerate Metformin. -She was recently switched by her cardiologist to  Farxiga 10 mg p.o. daily in lieu  of her glipizide.  She clearly appears to favor this approach.  -I had a long discussion with the patient regarding safer choices, patient side effects, as well as diabetes targets.  -Risk is high for the patient for euglycemic DKA in addition to the inherent side effects of this class of SGLT2 inhibitors in patient with CKD, likely compromised vascular  response, smoker. -She hesitantly accepts to  at lower lower the dose of Farxiga to 5  mg p.o. daily and resume her glipizide 5 mg p.o. twice daily for better and safer control of glycemia. --She agrees to continue monitoring blood glucose twice daily-daily before breakfast and at bedtime.  - she is encouraged to call clinic for blood glucose levels less than 70 or above 200 mg /dl. -In the meantime she is advised to continue glipizide 5 mg p.o. twice daily with breakfast and supper.  - she is not a suitable candidate for SGLT2 inhibitors  due to the above reasons and her concurrent stage 2-3 renal insufficiency.  - she is not a suitable candidate for incretin therapy either due to her body habitus as well as chronic smoking history, posing risk for pancreatitis.   - Specific targets for  A1c;  LDL, HDL,  and Triglycerides were discussed with the patient.  2) Blood Pressure /Hypertension:  Her blood pressure is tightly controlled to target.  Her blood pressure today is 91/63, she is not symptomatic.  she is advised to continue her current medications including Entresto 24/26 mg p.o. daily with breakfast .  She also has carvedilol 6.25 mg p.o. twice daily as well as spironolactone 25 mg p.o. daily.  She has no edema, advised to hold her furosemide for now.  3) Lipids/Hyperlipidemia:   She does not have recent lipid panel.   She is advised to continue atorvastatin 40 mg p.o. nightly.    Side effects and precautions discussed with her.  4)  Weight/Diet:  Body mass index is 28.98 kg/m.  -She is a candidate for some weight loss. I discussed with her the fact that loss of 5 - 10% of her  current body weight will have the most impact on her diabetes management.  Exercise, and detailed carbohydrates information provided  -  detailed on discharge instructions.   The patient was counseled on the dangers of tobacco use, and was advised to quit.  Reviewed strategies to maximize success, including removing  cigarettes and smoking materials from environment.   5) Chronic Care/Health Maintenance:  -she  is on ACEI/ARB and Statin medications and  is encouraged to initiate and continue to follow up with Ophthalmology, Dentist,  Podiatrist at least yearly or according to recommendations, and advised to  quit smoking. I have recommended yearly flu vaccine and pneumonia vaccine at least every 5 years; moderate intensity exercise for up to 150 minutes weekly; and  sleep for at least 7 hours a day.  - she is  advised to maintain close follow up with Mckenzie Evens, MD for primary care needs, as well as her other providers for optimal and coordinated care.   - Time spent on this patient care encounter:  45 min, of which > 50% was spent in  counseling and the rest reviewing her blood  glucose logs , discussing her hypoglycemia and hyperglycemia episodes, reviewing her current and  previous labs / studies  ( including abstraction from other facilities) and medications  doses and developing a  long term treatment plan and documenting her care.   Please refer to Patient Instructions for Blood Glucose Monitoring and Insulin/Medications Dosing Guide"  in media tab for additional information. Please  also refer to " Patient Self Inventory" in the Media  tab for reviewed elements of pertinent patient history.  Orbie Pyo participated in the discussions, expressed understanding, and voiced agreement with the above plans.  All questions were answered to her satisfaction. she is encouraged to contact clinic should she have any questions or concerns prior to her return visit.   Follow up plan: - Return in about 3 months (around 10/02/2019) for Bring Meter and Logs- A1c in Office.  Glade Lloyd, MD [facility] [address] [place], [place] [address] Phone: [phone]  Fax: [phone]    07/02/2019, 5:56 PM  This note was partially dictated with voice  recognition software. Similar sounding words can be transcribed inadequately or may not  be corrected upon review.

## 2019-07-03 ENCOUNTER — Other Ambulatory Visit (HOSPITAL_COMMUNITY): Payer: Self-pay | Admitting: Cardiology

## 2019-07-06 NOTE — Progress Notes (Signed)
Pt aware.

## 2019-07-09 ENCOUNTER — Other Ambulatory Visit: Payer: Self-pay

## 2019-07-09 ENCOUNTER — Telehealth: Payer: Medicaid Other | Admitting: Genetic Counselor

## 2019-07-13 ENCOUNTER — Other Ambulatory Visit (HOSPITAL_COMMUNITY): Payer: Medicaid Other

## 2019-07-14 ENCOUNTER — Other Ambulatory Visit (HOSPITAL_COMMUNITY): Payer: Self-pay | Admitting: Cardiology

## 2019-07-15 ENCOUNTER — Other Ambulatory Visit: Payer: Self-pay | Admitting: Cardiology

## 2019-08-03 ENCOUNTER — Other Ambulatory Visit: Payer: Self-pay

## 2019-08-03 ENCOUNTER — Ambulatory Visit (HOSPITAL_COMMUNITY)
Admission: RE | Admit: 2019-08-03 | Discharge: 2019-08-03 | Disposition: A | Discharge status: Home / Self Care | Payer: Medicaid Other | Source: Ambulatory Visit | Attending: Family Medicine | Admitting: Family Medicine

## 2019-08-03 DIAGNOSIS — Z1231 Encounter for screening mammogram for malignant neoplasm of breast: Secondary | ICD-10-CM | POA: Insufficient documentation

## 2019-08-06 ENCOUNTER — Other Ambulatory Visit: Payer: Self-pay

## 2019-08-06 ENCOUNTER — Telehealth: Payer: Medicaid Other | Admitting: Genetic Counselor

## 2019-08-07 LAB — BASIC METABOLIC PANEL
BUN/Creatinine Ratio: 22 (ref 9–23)
BUN: 27 mg/dL — ABNORMAL HIGH (ref 6–24)
CO2: 20 mmol/L (ref 20–29)
Calcium: 10 mg/dL (ref 8.7–10.2)
Chloride: 102 mmol/L (ref 96–106)
Creatinine, Ser: 1.22 mg/dL — ABNORMAL HIGH (ref 0.57–1.00)
GFR calc Af Amer: 57 mL/min/{1.73_m2} — ABNORMAL LOW (ref >59)
GFR calc non Af Amer: 49 mL/min/{1.73_m2} — ABNORMAL LOW (ref >59)
Glucose: 174 mg/dL — ABNORMAL HIGH (ref 65–99)
Potassium: 4.6 mmol/L (ref 3.5–5.2)
Sodium: 139 mmol/L (ref 134–144)

## 2019-08-07 LAB — GENESEQ:FAMILIAL CARDIOMYOPATH

## 2019-08-13 LAB — HM DIABETES EYE EXAM

## 2019-08-19 NOTE — Progress Notes (Signed)
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 She is a 59 year-old [place] American woman. She reports having sudden onset of dyspnea in January 2021 that was suspected to be from allergies or sinus issues. However, her dyspnea significantly worsened and she was taken to the ER. She was hospitalized at [facility] and underwent blood work and cardiac imaging. No blockages were detected. However, she was found to have CHF and was transferred to [facility] with an EF of 30-35%.    Currently, she is tired most of the time and reports some wheezing. She tells me that she had no symptoms prior to this event.  Family history [name] ([name]) has a 59 year-old healthy daughter ([name]) and a younger sister, age 14 ([name]). There is significant history of CHF in her paternal lineage. Her father ([name]) was diagnosed with CHF at 40 and had a heart transplant at 51. He later died of sepsis at 63. His siblings ([name]-[name]) did not have heart disease. Her paternal grandmother ([name]) died of CHF at 40. There is no overt history of heart disease in her maternal relatives.  Test Report Genetic testing performed by [facility] (Report date- 04/21/2019, [facility] ID #: [id number]) identified a variant of unknown significance. 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 Mutations in this gene have been found in patients with Emery-Dreifuss muscular dystrophy as well as X-linked myopathy with postural muscle atrophy and scapuloperoneal myopathy.  This note also includes the re-interpretation of his  genetic test result.  This variant was re-interpreted to verify the test report interpretation and to compile the information in light of the patient's clinical presentation and family history.  The FHL1 c. 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 This variant is very rare in the general population with an incidence of 0.0005% (gnomAD). However, this variant is not conserved in evolution and computational algorithms predict a benign effect of this variant on protein function.   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   Further testing of her first-degree relatives, namely father and paternal grandmother to assess if this variant segregates with disease could change its classification. However, as these family members are deceased, familial testing for this variant in her daughter is unwarranted.   I also recommended that she enroll in the [place] Study that seeks to identify other genetic variants that can cause cardiomyopathy in individuals that have a negative genetic test. She is not interested in enrolling in this study.  Please note that the patient has not been counseled in this visit on personal, cultural or ethical issues that they may face due to their heart condition.    Lattie Corns, Ph.D, [facility] Clinical Molecular Geneticist

## 2019-08-20 ENCOUNTER — Other Ambulatory Visit: Payer: Self-pay

## 2019-08-20 ENCOUNTER — Encounter: Payer: Self-pay | Admitting: Cardiology

## 2019-08-20 ENCOUNTER — Ambulatory Visit (INDEPENDENT_AMBULATORY_CARE_PROVIDER_SITE_OTHER): Payer: Medicaid Other | Admitting: Cardiology

## 2019-08-20 VITALS — BP 90/56 | HR 70 | Ht 66.0 in | Wt 177.8 lb

## 2019-08-20 DIAGNOSIS — Z720 Tobacco use: Secondary | ICD-10-CM | POA: Diagnosis not present

## 2019-08-20 DIAGNOSIS — I5022 Chronic systolic (congestive) heart failure: Secondary | ICD-10-CM

## 2019-08-20 DIAGNOSIS — I428 Other cardiomyopathies: Secondary | ICD-10-CM

## 2019-08-20 MED ORDER — SPIRONOLACTONE 25 MG PO TABS: 12.5000 mg | ORAL_TABLET | Freq: Every day | ORAL | 3 refills | Status: DC

## 2019-08-20 NOTE — Progress Notes (Signed)
Cardiology Office Note  Date: 08/20/2019   ID: [name], [name] November 18, 1960, MRN [id number]  PCP:  [name], MD  Cardiologist:  Rozann Lesches, MD Electrophysiologist:  None   Chief Complaint  Patient presents with  . Cardiac follow-up    History of Present Illness: [name] is a 59 y.o. female last seen in February.  Interval follow-up noted in the heart failure clinic with Dr. Aundra Dubin back in May, I reviewed the note.  Also genetic testing communication noted.  She presents for a routine visit.  States that she is doing reasonably well on medical therapy although blood pressures have been running low.  I went over her home checks, systolics are in the 60F to low100s maximum.  She is also lost weight since encounter in May, approximately 8 pounds.  Suspect this is related to [place] initiation, she is tolerating it well.  I did review her lab work in May.  Recent echocardiogram from May as outlined below, LVEF approximately 30 to 35% and mildly reduced RV contraction.  She is scheduled for a follow-up study within the next 6 months per Dr. Aundra Dubin and potentially referral to EP depending on LVEF at that time.  She reports no palpitations or syncope.   Past Medical History:  Diagnosis Date  . Anal fissure   . Anxiety   . Chronic systolic (congestive) heart failure (HCC)    a. EF 20-25% by echo in 02/2019 with cath showing no significant CAD  . Depression   . Essential hypertension   . GERD (gastroesophageal reflux disease)   . History of seizures as a child    None since age 85  . Mixed hyperlipidemia   . Nonischemic cardiomyopathy ([place])   . Type 2 diabetes mellitus ([place])     Past Surgical History:  Procedure Laterality Date  . BACK SURGERY  2005   lumbar disckectomy  . COLONOSCOPY  10/01/2011   Procedure: COLONOSCOPY;  Surgeon: [name], MD;  [facility];  Service: Endoscopy;  Laterality: N/A;  730  . COLONOSCOPY N/A 10/04/2016   Procedure:  COLONOSCOPY;  Surgeon: [name], MD;  [facility];  Service: Endoscopy;  Laterality: N/A;  10:30  . DILATION AND CURETTAGE OF UTERUS    . FOOT BONE EXCISION     right  . POLYPECTOMY  10/04/2016   Procedure: POLYPECTOMY;  Surgeon: [name], MD;  [facility];  Service: Endoscopy;;  colon  . RIGHT/LEFT HEART CATH AND CORONARY ANGIOGRAPHY N/A 03/23/2019   Procedure: RIGHT/LEFT HEART CATH AND CORONARY ANGIOGRAPHY;  Surgeon: [name], MD;  Location: [facility];  Service: Cardiovascular;  Laterality: N/A;    Current Outpatient Medications  Medication Sig Dispense Refill  . ALPRAZolam (XANAX) 1 MG tablet Take 1 mg by mouth at bedtime.    [name] atorvastatin (LIPITOR) 40 MG tablet Take 40 mg by mouth daily.    . carvedilol (COREG) 6.25 MG tablet Take 1 tablet (6.25 mg total) by mouth 2 (two) times daily with a meal. 180 tablet 3  . Cholecalciferol (VITAMIN D3) 125 MCG (5000 UT) CAPS Take 1 capsule by mouth daily.    . dapagliflozin propanediol (FARXIGA) 5 MG TABS tablet Take 2.5 mg by mouth daily.     [name] FLUoxetine (PROZAC) 40 MG capsule Take 40 mg by mouth daily.    [name] glipiZIDE (GLUCOTROL) 5 MG tablet Take 5 mg by mouth 2 (two) times daily before  a meal.     . loperamide (IMODIUM) 2 MG capsule TAKE 2 CAPSULES BY MOUTH INITIALLY, THEN TAKE 1 CAPSULE AFTER EACH LOOSE STOOL. DO NOT EXCEED 8 CAPSULES IN 24 HOURS.    [name] loratadine (CLARITIN) 10 MG tablet Take 10 mg by mouth daily.    [name] LORazepam (ATIVAN) 0.5 MG tablet Take 0.5 mg by mouth 3 (three) times daily.     . Melatonin (SM MELATONIN) 3 MG TABS Take 3 mg by mouth at bedtime.    . pantoprazole (PROTONIX) 40 MG tablet Take 1 tablet (40 mg total) by mouth daily. 30 tablet 0  . sacubitril-valsartan (ENTRESTO) 24-26 MG Take 1 tablet by mouth 2 (two) times daily. 60 tablet 6  . spironolactone (ALDACTONE) 25 MG tablet Take 0.5 tablets (12.5 mg total) by mouth daily. 45 tablet 3  . varenicline (CHANTIX  STARTING MONTH PAK) 0.5 MG X 11 & 1 MG X 42 tablet Take one 0.5 mg tablet by mouth once daily for 3 days, then increase to one 0.5 mg tablet twice daily for 4 days, then increase to one 1 mg tablet twice daily. 53 tablet 0   No current facility-administered medications for this visit.   Allergies:  Patient has no known allergies.   ROS:   No orthopnea or PND, no leg swelling.  Physical Exam: VS:  BP (!) 90/56   Pulse 70   Ht 5\' 6"  (1.676 m)   Wt 177 lb 12.8 oz (80.6 kg)   LMP 09/25/2011   SpO2 98%   BMI 28.70 kg/m , BMI Body mass index is 28.7 kg/m.  Wt Readings from Last 3 Encounters:  08/20/19 177 lb 12.8 oz (80.6 kg)  07/02/19 185 lb (83.9 kg)  07/01/19 183 lb (83 kg)    General: Patient appears comfortable at rest. HEENT: Conjunctiva and lids normal, wearing a mask. Neck: Supple, no elevated JVP or carotid bruits, no thyromegaly. Lungs: Clear to auscultation, nonlabored breathing at rest. Cardiac: Regular rate and rhythm, no S3, soft systolic murmur, no pericardial rub. Abdomen: Soft, bowel sounds present. Extremities: No pitting edema, distal pulses 2+.  ECG:  An ECG dated 03/20/2019 was personally reviewed today and demonstrated:  Sinus rhythm with IVCD, increased voltage with repolarization abnormalities.  Recent Labwork: 03/20/2019: ALT 23; AST 17; B Natriuretic Peptide 464.0 03/24/2019: Hemoglobin 12.4; Platelets 260 07/15/2019: BUN 27; Creatinine, Ser 1.22; Potassium 4.6; Sodium 139   Other Studies Reviewed Today:  Echocardiogram 07/01/2019: 1. Left ventricular ejection fraction, by estimation, is 30 to 35%. The  left ventricle has moderately decreased function. The left ventricle  demonstrates global hypokinesis. Left ventricular diastolic parameters are  consistent with Grade I diastolic  dysfunction (impaired relaxation).  2. Right ventricular systolic function is mildly reduced. The right  ventricular size is normal. Tricuspid regurgitation signal is  inadequate  for assessing PA pressure.  3. The mitral valve is normal in structure. No evidence of mitral valve  regurgitation. No evidence of mitral stenosis.  4. The aortic valve is tricuspid. Aortic valve regurgitation is trivial.  No aortic stenosis is present.  5. The inferior vena cava is normal in size with greater than 50%  respiratory variability, suggesting right atrial pressure of 3 mmHg.   Assessment and Plan:  1.  Nonischemic cardiomyopathy with LVEF approximately 30 to 35% and mild RV dysfunction, chronic systolic heart failure.  She is clinically stable at this point, NYHA class II symptoms.  Weight is down about 8 pounds, possibly related to interval  initiation of [place].  She is not on Lasix but is on Aldactone which will reduce to 12.5 mg daily for now.  Otherwise continue current doses of Coreg and Entresto, no further up titration in light of current blood pressure.  Follow-up echocardiogram will be obtained within the next 6 months and potentially referral to EP depending on LVEF at that point.  2.  Type 2 diabetes mellitus, currently on Farxiga and Glucotrol.  She follows with Dr. Dorris Fetch.  3.  Tobacco abuse, continuing to work toward cessation.  Medication Adjustments/Labs and Tests Ordered: Current medicines are reviewed at length with the patient today.  Concerns regarding medicines are outlined above.   Tests Ordered: No orders of the defined types were placed in this encounter.   Medication Changes: Meds ordered this encounter  Medications  . spironolactone (ALDACTONE) 25 MG tablet    Sig: Take 0.5 tablets (12.5 mg total) by mouth daily.    Dispense:  45 tablet    Refill:  3    08/20/2019 dose decrease    Disposition:  Follow up 6 months in the [place] office with interval follow-up in the heart failure clinic.  Signed, Satira Sark, MD, [facility] 08/20/2019 9:47 AM    [facility] [place], [place], [place]  [address] Phone: [phone]; Fax: [phone]

## 2019-08-20 NOTE — Patient Instructions (Addendum)
Medication Instructions:    Your physician has recommended you make the following change in your medication:   Decrease spironolactone to 12.5 mg daily. Please break your 25 mg tablet in half daily.  Continue other medications the same  Labwork:  NONE  Testing/Procedures:  NONE  Follow-Up:  Your physician recommends that you schedule a follow-up appointment in: 6 months (office). You will receive a reminder letter in the mail in about 4 months reminding you to call and schedule your appointment. If you don't receive this letter, please contact our office.  Any Other Special Instructions Will Be Listed Below (If Applicable).  If you need a refill on your cardiac medications before your next appointment, please call your pharmacy.

## 2019-09-18 ENCOUNTER — Ambulatory Visit: Payer: Medicaid Other | Admitting: Cardiology

## 2019-09-25 ENCOUNTER — Telehealth: Payer: Self-pay

## 2019-09-25 NOTE — Telephone Encounter (Signed)
Pt states that she is smoking 1-2 cigarettes daily. States that it is hard to quit with out the chantix. Pt unable to give exact quit date. Please advise.

## 2019-09-25 NOTE — Telephone Encounter (Signed)
Not necessarily the same as Chantix.  Other option would be working toward a quit date at which point she would go on nicotine replacement patches and Wellbutrin.

## 2019-09-25 NOTE — Telephone Encounter (Signed)
If she is only smoking 1 or 2 cigarettes a day, she needs to think about when would be a good time for her to pick a quit date.  She could start a Wellbutrin Dosepak a week or 2 before this quit date and then use as needed nicotine gum or lozenges thereafter.  She probably would not need nicotine patches if she is smoking that few cigarettes.

## 2019-09-25 NOTE — Telephone Encounter (Signed)
New message    There is a recall on Chantix - she wants to know is there something else she can take to quit smoking?

## 2019-09-29 ENCOUNTER — Telehealth: Payer: Self-pay | Admitting: Cardiology

## 2019-09-29 NOTE — Telephone Encounter (Signed)
Called pt no answer. Left msg to call back.  

## 2019-09-29 NOTE — Telephone Encounter (Signed)
Pt states she rec'd a call,however she doesn't remember who called her from this office.   Please call again [phone]   thanks renee

## 2019-09-29 NOTE — Telephone Encounter (Signed)
Returned call to pt and informed pt of Dr. Myles Gip suggestions.

## 2019-09-29 NOTE — Telephone Encounter (Signed)
Pt was seen by her PCP on yesterday and given another med to replace the chantix.

## 2019-10-01 ENCOUNTER — Encounter (HOSPITAL_COMMUNITY): Payer: Self-pay

## 2019-10-01 ENCOUNTER — Emergency Department (HOSPITAL_COMMUNITY): Payer: Medicaid Other

## 2019-10-01 ENCOUNTER — Emergency Department (HOSPITAL_COMMUNITY)
Admission: EM | Admit: 2019-10-01 | Discharge: 2019-10-01 | Disposition: A | Discharge status: Home / Self Care | Payer: Medicaid Other | Attending: Emergency Medicine | Admitting: Emergency Medicine

## 2019-10-01 ENCOUNTER — Other Ambulatory Visit: Payer: Self-pay

## 2019-10-01 DIAGNOSIS — F17210 Nicotine dependence, cigarettes, uncomplicated: Secondary | ICD-10-CM | POA: Diagnosis not present

## 2019-10-01 DIAGNOSIS — E119 Type 2 diabetes mellitus without complications: Secondary | ICD-10-CM | POA: Diagnosis not present

## 2019-10-01 DIAGNOSIS — W010XXA Fall on same level from slipping, tripping and stumbling without subsequent striking against object, initial encounter: Secondary | ICD-10-CM | POA: Diagnosis not present

## 2019-10-01 DIAGNOSIS — N183 Chronic kidney disease, stage 3 unspecified: Secondary | ICD-10-CM | POA: Diagnosis not present

## 2019-10-01 DIAGNOSIS — S99922A Unspecified injury of left foot, initial encounter: Secondary | ICD-10-CM | POA: Diagnosis present

## 2019-10-01 DIAGNOSIS — I5022 Chronic systolic (congestive) heart failure: Secondary | ICD-10-CM | POA: Diagnosis not present

## 2019-10-01 DIAGNOSIS — Y9289 Other specified places as the place of occurrence of the external cause: Secondary | ICD-10-CM | POA: Insufficient documentation

## 2019-10-01 DIAGNOSIS — Y9389 Activity, other specified: Secondary | ICD-10-CM | POA: Insufficient documentation

## 2019-10-01 DIAGNOSIS — S92352A Displaced fracture of fifth metatarsal bone, left foot, initial encounter for closed fracture: Secondary | ICD-10-CM | POA: Insufficient documentation

## 2019-10-01 DIAGNOSIS — I130 Hypertensive heart and chronic kidney disease with heart failure and stage 1 through stage 4 chronic kidney disease, or unspecified chronic kidney disease: Secondary | ICD-10-CM | POA: Diagnosis not present

## 2019-10-01 DIAGNOSIS — Z79899 Other long term (current) drug therapy: Secondary | ICD-10-CM | POA: Insufficient documentation

## 2019-10-01 DIAGNOSIS — E1122 Type 2 diabetes mellitus with diabetic chronic kidney disease: Secondary | ICD-10-CM | POA: Diagnosis not present

## 2019-10-01 DIAGNOSIS — Y999 Unspecified external cause status: Secondary | ICD-10-CM | POA: Diagnosis not present

## 2019-10-01 MED ORDER — HYDROCODONE-ACETAMINOPHEN 5-325 MG PO TABS
1.0000 | ORAL_TABLET | Freq: Once | ORAL | Status: AC
  Administered 2019-10-01: 1 via ORAL
  Filled 2019-10-01: qty 1

## 2019-10-01 MED ORDER — HYDROCODONE-ACETAMINOPHEN 5-325 MG PO TABS: 2.0000 | ORAL_TABLET | ORAL | 0 refills | Status: DC | PRN

## 2019-10-01 MED ORDER — ACETAMINOPHEN ER 650 MG PO TBCR: 650.0000 mg | EXTENDED_RELEASE_TABLET | Freq: Three times a day (TID) | ORAL | 0 refills | Status: DC | PRN

## 2019-10-01 NOTE — ED Notes (Signed)
Pt verbalized DC instructions

## 2019-10-01 NOTE — ED Triage Notes (Signed)
Pt reports hit left foot on her recliner last night.  Pt wrapped with ace wrap and wearing post op shoe.  Reports hurts to put weight on left foot.

## 2019-10-01 NOTE — Discharge Instructions (Signed)
Follow up with Dr. Erlinda Hong for your fracture.  No weight bearing until seen by your doctor.

## 2019-10-02 ENCOUNTER — Encounter (HOSPITAL_COMMUNITY): Payer: Self-pay | Admitting: Cardiology

## 2019-10-02 ENCOUNTER — Ambulatory Visit (HOSPITAL_COMMUNITY)
Admission: RE | Admit: 2019-10-02 | Discharge: 2019-10-02 | Disposition: A | Discharge status: Home / Self Care | Payer: Medicaid Other | Source: Ambulatory Visit | Attending: Cardiology | Admitting: Cardiology

## 2019-10-02 VITALS — BP 88/62 | HR 76 | Ht 67.0 in | Wt 172.6 lb

## 2019-10-02 DIAGNOSIS — Z79899 Other long term (current) drug therapy: Secondary | ICD-10-CM | POA: Insufficient documentation

## 2019-10-02 DIAGNOSIS — J449 Chronic obstructive pulmonary disease, unspecified: Secondary | ICD-10-CM | POA: Diagnosis not present

## 2019-10-02 DIAGNOSIS — I2510 Atherosclerotic heart disease of native coronary artery without angina pectoris: Secondary | ICD-10-CM | POA: Diagnosis not present

## 2019-10-02 DIAGNOSIS — F17210 Nicotine dependence, cigarettes, uncomplicated: Secondary | ICD-10-CM | POA: Insufficient documentation

## 2019-10-02 DIAGNOSIS — I5022 Chronic systolic (congestive) heart failure: Secondary | ICD-10-CM | POA: Diagnosis not present

## 2019-10-02 DIAGNOSIS — Z8249 Family history of ischemic heart disease and other diseases of the circulatory system: Secondary | ICD-10-CM | POA: Insufficient documentation

## 2019-10-02 DIAGNOSIS — E119 Type 2 diabetes mellitus without complications: Secondary | ICD-10-CM | POA: Insufficient documentation

## 2019-10-02 DIAGNOSIS — Z7984 Long term (current) use of oral hypoglycemic drugs: Secondary | ICD-10-CM | POA: Insufficient documentation

## 2019-10-02 DIAGNOSIS — I110 Hypertensive heart disease with heart failure: Secondary | ICD-10-CM | POA: Insufficient documentation

## 2019-10-02 DIAGNOSIS — E785 Hyperlipidemia, unspecified: Secondary | ICD-10-CM | POA: Diagnosis not present

## 2019-10-02 LAB — BASIC METABOLIC PANEL
Anion gap: 13 (ref 5–15)
BUN: 40 mg/dL — ABNORMAL HIGH (ref 6–20)
CO2: 20 mmol/L — ABNORMAL LOW (ref 22–32)
Calcium: 9.4 mg/dL (ref 8.9–10.3)
Chloride: 107 mmol/L (ref 98–111)
Creatinine, Ser: 1.28 mg/dL — ABNORMAL HIGH (ref 0.44–1.00)
GFR calc Af Amer: 54 mL/min — ABNORMAL LOW (ref >60)
GFR calc non Af Amer: 46 mL/min — ABNORMAL LOW (ref >60)
Glucose, Bld: 103 mg/dL — ABNORMAL HIGH (ref 70–99)
Potassium: 4.1 mmol/L (ref 3.5–5.1)
Sodium: 140 mmol/L (ref 135–145)

## 2019-10-02 MED ORDER — DAPAGLIFLOZIN PROPANEDIOL 5 MG PO TABS: 5.0000 mg | ORAL_TABLET | Freq: Every day | ORAL | 6 refills | Status: DC

## 2019-10-02 NOTE — Patient Instructions (Signed)
Increase Farxiga to 5 mg Daily  Labs done today, we will call you for abnormal results  Please follow up with our heart failure pharmacist in 6 weeks  Your physician recommends that you schedule a follow-up appointment in: 3 months with echocardiogram  If you have any questions or concerns before your next appointment please send [place] a message through [place] or call our office at [phone].    TO LEAVE A MESSAGE FOR THE NURSE SELECT OPTION 2, PLEASE LEAVE A MESSAGE INCLUDING: . YOUR NAME . DATE OF BIRTH . CALL BACK NUMBER . REASON FOR CALL**this is important as we prioritize the call backs  [place] AS LONG AS YOU CALL BEFORE 4:00 PM  At the [facility], you and your health needs are our priority. As part of our continuing mission to provide you with exceptional heart care, we have created designated Provider Care Teams. These Care Teams include your primary Cardiologist (physician) and Advanced Practice Providers (APPs- Physician Assistants and Nurse Practitioners) who all work together to provide you with the care you need, when you need it.   You may see any of the following providers on your designated Care Team at your next follow up: [name] Dr Glori Bickers . Dr Loralie Champagne . Darrick Grinder, NP . Lyda Jester, PA . Audry Riles, [name]   Please be sure to bring in all your medications bottles to every appointment.

## 2019-10-03 NOTE — ED Provider Notes (Signed)
[facility] EMERGENCY DEPARTMENT Provider Note   CSN: [id number] Arrival date & time: 10/01/19  3235     History Chief Complaint  Patient presents with   Foot Pain    [name] is a 59 y.o. female.  HPI     59 y/o comes in with cc of foot pain. Pt reports that she slipped and hit her foot on the bottom of the recliner. The injury occurred y'day. Now she is having pain with ambulation and not getting better. Pain in the toes and distal foot.  Past Medical History:  Diagnosis Date   Anal fissure    Anxiety    Chronic systolic (congestive) heart failure ([place])    a. 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52   Mixed hyperlipidemia    Nonischemic cardiomyopathy ([place])    Type 2 diabetes mellitus ([place])     Patient Active Problem List   Diagnosis Date Noted   Mixed hyperlipidemia 05/12/2019   Acute CHF (congestive heart failure) ([place]) 03/20/2019   Hypokalemia 04/30/2018   Hypomagnesemia 04/30/2018   Cellulitis of left buttock 04/30/2018   Cellulitis of right buttock 04/30/2018   Unspecified open wound of left buttock, initial encounter    Left buttock abscess 04/29/2018   History of colonic polyps 06/14/2016   Diverticulitis of colon (without mention of hemorrhage)(562.11) 05/18/2013   Acute renal failure ([place]) 05/14/2013   Hyperkalemia 05/14/2013   Hypotension 05/14/2013   Current smoker 05/14/2013   Diabetes mellitus with stage 3 chronic kidney disease ([place]) 11/05/2011   Essential hypertension, benign 11/05/2011   Diarrhea 11/05/2011   Anal fissure 11/05/2011    Past Surgical History:  Procedure Laterality Date   BACK SURGERY  2005   lumbar disckectomy   COLONOSCOPY  10/01/2011   Procedure: COLONOSCOPY;  Surgeon: [name], MD;  [facility];  Service:  Endoscopy;  Laterality: N/A;  730   COLONOSCOPY N/A 10/04/2016   Procedure: COLONOSCOPY;  Surgeon: [name], MD;  [facility];  Service: Endoscopy;  Laterality: N/A;  10:30   DILATION AND CURETTAGE OF UTERUS     FOOT BONE EXCISION     right   POLYPECTOMY  10/04/2016   Procedure: POLYPECTOMY;  Surgeon: [name], MD;  [facility];  Service: Endoscopy;;  colon   RIGHT/LEFT HEART CATH AND CORONARY ANGIOGRAPHY N/A 03/23/2019   Procedure: RIGHT/LEFT HEART CATH AND CORONARY ANGIOGRAPHY;  Surgeon: [name], MD;  Location: [facility];  Service: Cardiovascular;  Laterality: N/A;     OB History   No obstetric history on file.     Family History  Problem Relation Age of Onset   Arthritis Mother    Hernia Mother    Constipation Mother    Heart failure Father        Underwent heart transplant   Colon cancer Neg Hx     Social History   Tobacco Use   Smoking status: Current Every Day Smoker    Packs/day: 0.25    Years: 37.00    Pack years: 9.25    Types: Cigarettes   Smokeless tobacco: Never Used   Tobacco comment: 1-2 cigarettes daily  Vaping Use   Vaping Use: Never used  Substance Use Topics  Alcohol use: No   Drug use: No    Home Medications Prior to Admission medications   Medication Sig Start Date End Date Taking? Authorizing Provider  Accu-Chek FastClix Lancets MISC Apply topically 2 (two) times daily. 08/20/19   [provider]  ACCU-CHEK GUIDE test strip USE TO TEST TWICE DAILY.O 09/09/19   [provider]  acetaminophen (TYLENOL 8 HOUR) 650 MG CR tablet Take 1 tablet (650 mg total) by mouth every 8 (eight) hours as needed for pain or fever. 10/01/19   Varney Biles, MD  ALPRAZolam Duanne Moron) 1 MG tablet Take 1 mg by mouth at bedtime.    [provider]  atorvastatin (LIPITOR) 40 MG tablet Take 40 mg by mouth daily.    [provider]  buPROPion (WELLBUTRIN SR) 150 MG 12 hr  tablet Take 150 mg by mouth daily. 09/28/19   [provider]  carvedilol (COREG) 6.25 MG tablet Take 1 tablet (6.25 mg total) by mouth 2 (two) times daily with a meal. 05/28/19   Strader, [place] M, PA-C  Cholecalciferol (VITAMIN D3) 125 MCG (5000 UT) CAPS Take 1 capsule by mouth daily.    [provider]  dapagliflozin propanediol (FARXIGA) 5 MG TABS tablet Take 1 tablet (5 mg total) by mouth daily. 10/02/19   [name], MD  FLUoxetine (PROZAC) 40 MG capsule Take 40 mg by mouth daily.    [provider]  fluticasone (FLONASE) 50 MCG/ACT nasal spray Place 2 sprays into both nostrils daily. 08/04/19   [provider]  glipiZIDE (GLUCOTROL) 5 MG tablet Take 5 mg by mouth 2 (two) times daily before a meal.     [provider]  HYDROcodone-acetaminophen (NORCO/VICODIN) 5-325 MG tablet Take 2 tablets by mouth every 4 (four) hours as needed. 10/01/19   Varney Biles, MD  loperamide (IMODIUM) 2 MG capsule TAKE 2 CAPSULES BY MOUTH INITIALLY, THEN TAKE 1 CAPSULE AFTER EACH LOOSE STOOL. DO NOT EXCEED 8 CAPSULES IN 24 HOURS. 06/29/19   [provider]  loratadine (CLARITIN) 10 MG tablet Take 10 mg by mouth daily.    [provider]  LORazepam (ATIVAN) 0.5 MG tablet Take 0.5 mg by mouth 3 (three) times daily.     [provider]  Melatonin (SM MELATONIN) 3 MG TABS Take 3 mg by mouth at bedtime.    [provider]  pantoprazole (PROTONIX) 40 MG tablet Take 1 tablet (40 mg total) by mouth daily. 05/19/13   Black, Lezlie Octave, NP  sacubitril-valsartan (ENTRESTO) 24-26 MG Take 1 tablet by mouth 2 (two) times daily. 04/02/19   [name], MD  spironolactone (ALDACTONE) 25 MG tablet Take 0.5 tablets (12.5 mg total) by mouth daily. 08/20/19   Satira Sark, MD    Allergies    Patient has no known allergies.  Review of Systems   Review of Systems  Constitutional: Positive for activity change.  Respiratory: Negative for shortness of  breath.   Cardiovascular: Negative for chest pain.  Musculoskeletal: Positive for arthralgias.  Neurological: Negative for weakness, numbness and headaches.  Hematological: Does not bruise/bleed easily.    Physical Exam Updated Vital Signs BP (!) 101/57    Pulse 63    Temp 98.5 F (36.9 C)    Resp 12    Ht 5\' 7"  (1.702 m)    Wt 78 kg    LMP 09/25/2011    SpO2 98%    BMI 26.94 kg/m   Physical Exam Vitals and nursing note reviewed.  Constitutional:      Appearance: She is well-developed.  Cardiovascular:     Rate and Rhythm: Normal rate.  Pulmonary:     Effort: Pulmonary effort is normal.  Abdominal:     General: Bowel sounds are normal.  Musculoskeletal:     Cervical back: No tenderness.     Comments: L distal foot tenderness, edema  Skin:    General: Skin is warm.  Neurological:     Mental Status: She is alert and oriented to person, place, and time.     ED Results / Procedures / Treatments   Labs (all labs ordered are listed, but only abnormal results are displayed) Labs Reviewed - No data to display  EKG None  Radiology No results found.  Procedures Procedures (including critical care time)  Medications Ordered in ED Medications  HYDROcodone-acetaminophen (NORCO/VICODIN) 5-325 MG per tablet 1 tablet (1 tablet Oral Given 10/01/19 0825)    ED Course  I have reviewed the triage vital signs and the nursing notes.  Pertinent labs & imaging results that were available during my care of the patient were reviewed by me and considered in my medical decision making (see chart for details).    MDM Rules/Calculators/A&P                          Pt comes in with cc of foot pain. She is noted to have tenderness over the base of the 5th toe. Xrays show fracture of the 5th metatarsal. Pt reports that she will be able to use crutch. RICE advised. Non weightbearing status discussed.  Final Clinical Impression(s) / ED Diagnoses Final diagnoses:  Closed displaced  fracture of fifth metatarsal bone of left foot, initial encounter    Rx / DC Orders ED Discharge Orders         Ordered    HYDROcodone-acetaminophen (NORCO/VICODIN) 5-325 MG tablet  Every 4 hours PRN     Discontinue  Reprint     10/01/19 0917    acetaminophen (TYLENOL 8 HOUR) 650 MG CR tablet  Every 8 hours PRN     Discontinue  Reprint     10/01/19 [place], Juwon Scripter, MD 10/03/19 1400

## 2019-10-04 NOTE — Progress Notes (Signed)
Advanced Heart Failure Clinic Note   Referring Physician: PCP: [name], MD PCP-Cardiologist: Rozann Lesches, MD  HF Cardiology: Dr. Aundra Dubin   HPI:  59 y.o. female w/ history of DM, HTN, hyperlipidemia, and smoking.  She was admitted on 1/22 for about 1 week of shortness of breath. She had been started on azithromycin and prednisone by PCP and was taking inhalers for ?COPD exacerbation without relief.  COVID negative.  Dyspnea primarily exertional, no orthopnea. No chest pain reported.  In ER at [facility], pro-BNP was elevated and CXR suggestive of volume overload. Slightly elevated HS-TnI with no trend, not suggestive of ACS.  She was admitted for management of new diagnosis of CHF and seen by Dr. Domenic Polite.  Echo was done, showed EF 20-25% with mild RV dysfunction. She was started on IV Lasix for diuresed then transitioned to PO.  She was transferred from [facility] to [facility] for RHC/LHC to assess filling pressures/CO and also to assess for coronary disease as etiology.   Of note, her father developed CHF in his 51s, nonischemic cardiomyopathy.  He had ICD and later heart transplant.  Grandmother also had CHF.   Cath was done 1/25 and showed no significant coronary disease and optimized filling pressures with preserved cardiac output (see angiographic and hemodynamic data below). Diagnosed w/ NICM. cMRI showed no myocardial LGE, so no definitive evidence for prior MI, infiltrative disease, or cardiomyopathy. RV normal w/ EF 35%. She was started on GDMT w/ ARB, ? blocker and MRA + loop diuretic. Actos for DM was discontinued w/ plan to consider eventual SGLT2i in the future.   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 Broadus John, thought to be probably be a benign variant.   Echo 5/21 with EF 30-35% (closer to 35%), diffuse hypokinesis, mildly decreased RV systolic function, normal IVC.   She presents for followup of CHF.  She is smoking 1-2 cigs/day, now on  bupropion.  She kicked a sofa by accident and fractured a bone in her foot, now wearing a boot.  No significant exertional dyspnea.  Main complaint is foot pain, using a walker.  No orthopnea/PND.  No chest pain. Weight is down 11 lbs.  She is only taking dapagliflozin 2.5 mg daily. BP runs low but no lightheadedness or falls.   PMH: 1. Hyperlipidemia 2. Type 2 diabetes.  3. Chronic systolic CHF:  - Echo (2/83).  EF 20-25%, mildly decreased RV function.  - RHC/LHC (1/21): Nonobstructive CAD; mean RA 9, PA 28/5, mean PCWP 9, CI 2.37 - Cardiac MRI (1/21): LV EF 31%, RVEF 28%, No myocardial LGE.  - Echo (5/21): EF 30-35% (closer to 35%), diffuse hypokinesis, mildly decreased RV systolic function, normal IVC. 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   Review of Systems: All systems reviewed and negative except as per HPI.   Current Outpatient Medications  Medication Sig Dispense Refill   Accu-Chek FastClix Lancets MISC Apply topically 2 (two) times daily.     ACCU-CHEK GUIDE test strip USE TO TEST TWICE DAILY.O     acetaminophen (TYLENOL 8 HOUR) 650 MG CR tablet Take 1 tablet (650 mg total) by mouth every 8 (eight) hours as needed for pain or fever. 20 tablet 0   ALPRAZolam (XANAX) 1 MG tablet Take 1 mg by mouth at bedtime.     atorvastatin (LIPITOR) 40 MG tablet Take 40 mg by mouth daily.  buPROPion (WELLBUTRIN SR) 150 MG 12 hr tablet Take 150 mg by mouth daily.     carvedilol (COREG) 6.25 MG tablet Take 1 tablet (6.25 mg total) by mouth 2 (two) times daily with a meal. 180 tablet 3   Cholecalciferol (VITAMIN D3) 125 MCG (5000 UT) CAPS Take 1 capsule by mouth daily.     dapagliflozin propanediol (FARXIGA) 5 MG TABS tablet Take 1 tablet (5 mg total) by mouth daily. 30 tablet 6   FLUoxetine (PROZAC) 40 MG capsule Take 40 mg by mouth daily.     fluticasone (FLONASE) 50 MCG/ACT nasal spray Place 2 sprays into  both nostrils daily.     glipiZIDE (GLUCOTROL) 5 MG tablet Take 5 mg by mouth 2 (two) times daily before a meal.      HYDROcodone-acetaminophen (NORCO/VICODIN) 5-325 MG tablet Take 2 tablets by mouth every 4 (four) hours as needed. 6 tablet 0   loperamide (IMODIUM) 2 MG capsule TAKE 2 CAPSULES BY MOUTH INITIALLY, THEN TAKE 1 CAPSULE AFTER EACH LOOSE STOOL. DO NOT EXCEED 8 CAPSULES IN 24 HOURS.     loratadine (CLARITIN) 10 MG tablet Take 10 mg by mouth daily.     LORazepam (ATIVAN) 0.5 MG tablet Take 0.5 mg by mouth 3 (three) times daily.      Melatonin (SM MELATONIN) 3 MG TABS Take 3 mg by mouth at bedtime.     pantoprazole (PROTONIX) 40 MG tablet Take 1 tablet (40 mg total) by mouth daily. 30 tablet 0   sacubitril-valsartan (ENTRESTO) 24-26 MG Take 1 tablet by mouth 2 (two) times daily. 60 tablet 6   spironolactone (ALDACTONE) 25 MG tablet Take 0.5 tablets (12.5 mg total) by mouth daily. 45 tablet 3   No current facility-administered medications for this encounter.    No Known Allergies    Social History   Socioeconomic History   Marital status: Divorced    Spouse name: Not on file   Number of children: Not on file   Years of education: Not on file   Highest education level: Not on file  Occupational History   Not on file  Tobacco Use   Smoking status: Current Every Day Smoker    Packs/day: 0.25    Years: 37.00    Pack years: 9.25    Types: Cigarettes   Smokeless tobacco: Never Used   Tobacco comment: 1-2 cigarettes daily  Vaping Use   Vaping Use: Never used  Substance and Sexual Activity   Alcohol use: No   Drug use: No   Sexual activity: Yes    Birth control/protection: None  Other Topics Concern   Not on file  Social History Narrative   Not on file   Social Determinants of Health   Financial Resource Strain:    Difficulty of Paying Living Expenses:   Food Insecurity:    Worried About Charity fundraiser in the Last Year:    Academic librarian in the Last Year:   Transportation Needs:    Film/video editor (Medical):    Lack of Transportation (Non-Medical):   Physical Activity:    Days of Exercise per Week:    Minutes of Exercise per Session:   Stress:    Feeling of Stress :   Social Connections:    Frequency of Communication with Friends and Family:    Frequency of Social Gatherings with Friends and Family:    Attends Religious Services:    Active Member of Clubs or Organizations:  Attends Archivist Meetings:    Marital Status:   Intimate Partner Violence:    Fear of Current or Ex-Partner:    Emotionally Abused:    Physically Abused:    Sexually Abused:       Family History  Problem Relation Age of Onset   Arthritis Mother    Hernia Mother    Constipation Mother    Heart failure Father        Underwent heart transplant   Colon cancer Neg Hx     Vitals:   10/02/19 1200  BP: (!) 88/62  Pulse: 76  SpO2: 95%  Weight: 78.3 kg (172 lb 9.6 oz)  Height: 5\' 7"  (1.702 m)     PHYSICAL EXAM: General: NAD Neck: No JVD, no thyromegaly or thyroid nodule.  Lungs: Occasional end expiratory wheezes CV: Nondisplaced PMI.  Heart regular S1/S2, no S3/S4, no murmur.  No peripheral edema.  No carotid bruit.  Normal pedal pulses.  Abdomen: Soft, nontender, no hepatosplenomegaly, no distention.  Skin: Intact without lesions or rashes.  Neurologic: Alert and oriented x 3.  Psych: Normal affect. Extremities: No clubbing or cyanosis.  HEENT: Normal.    ASSESSMENT & PLAN:  1. Chronic Systolic CHF: Echo 4/03 w/ EF 20-25%, mild RV dysfunction. No prior cardiac history but father has history of nonischemic cardiomyopathy withheart transplant and paternal grandmother also had CHF.  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 Broadus John, probably benign variant.LHC/RHC 1/21 showed no significant coronary disease and optimized filling pressures with  preserved cardiac output. cMRi showed no myocardial LGE, so no definitive evidence for prior MI, infiltrative disease, or cardiomyopathy.  Echo 5/21 showed mild improvement in LV function, EF 30-35% (probably closer to 35%).  She is not volume overloaded, NYHA class 2 symptoms.  BP soft but she denies lightheadedness.  - Continue Entresto 24-26 bid, will not increase with low BP.  - Continue Spironolactone 12.5 mg daily  - Continue Coreg 6.25 mg bid, will not increase with relatively low BP.  - Increase dapagliflozin to 5 mg daily.  - BMET today.  - EF < 35% but with some improvement.  I will have her get an echo again in 3 months at followup, and if EF not > 35, will refer for ICD (not CRT candidate).   - Discussed continuation of low sodium diet and daily wts 2. Smoking: She has almost quit.  - Continue bupropion.  3. HTN: BP now low.  4. Type 2 diabetes: As above, increasing dapagliflozion.   Followup in 6 wks with HF pharmacist for med titration, see me in 3 months with echo.    Loralie Champagne, MD 10/04/19

## 2019-10-06 ENCOUNTER — Telehealth (HOSPITAL_COMMUNITY): Payer: Self-pay

## 2019-10-06 DIAGNOSIS — I5022 Chronic systolic (congestive) heart failure: Secondary | ICD-10-CM

## 2019-10-06 NOTE — Telephone Encounter (Signed)
-----   Message from [name], MD sent at 10/04/2019 10:56 PM EDT ----- Increase hydration and make sure she is not taking any Lasix. BMET 10 days.

## 2019-10-06 NOTE — Telephone Encounter (Signed)
Patient advised and verbalized understanding. Patient would like labs done at [facility] in [place]. Lab order faxed to [phone] and entered electrically.    Orders Placed This Encounter  Procedures   Basic Metabolic Panel (BMET)    Standing Status:   Future    Standing Expiration Date:   10/05/2020    Order Specific Question:   Release to patient    Answer:   Immediate

## 2019-10-08 ENCOUNTER — Ambulatory Visit: Payer: Medicaid Other | Admitting: Nutrition

## 2019-10-08 ENCOUNTER — Ambulatory Visit (INDEPENDENT_AMBULATORY_CARE_PROVIDER_SITE_OTHER): Payer: Medicaid Other | Admitting: "Endocrinology

## 2019-10-08 ENCOUNTER — Encounter: Payer: Self-pay | Admitting: "Endocrinology

## 2019-10-08 ENCOUNTER — Ambulatory Visit (INDEPENDENT_AMBULATORY_CARE_PROVIDER_SITE_OTHER): Payer: Medicaid Other | Admitting: Orthopedic Surgery

## 2019-10-08 ENCOUNTER — Encounter: Payer: Self-pay | Admitting: Orthopedic Surgery

## 2019-10-08 ENCOUNTER — Other Ambulatory Visit: Payer: Self-pay

## 2019-10-08 VITALS — BP 102/70 | HR 82 | Ht 66.0 in

## 2019-10-08 VITALS — BP 98/67 | HR 76 | Ht 66.0 in

## 2019-10-08 DIAGNOSIS — I10 Essential (primary) hypertension: Secondary | ICD-10-CM

## 2019-10-08 DIAGNOSIS — N182 Chronic kidney disease, stage 2 (mild): Secondary | ICD-10-CM

## 2019-10-08 DIAGNOSIS — S92355A Nondisplaced fracture of fifth metatarsal bone, left foot, initial encounter for closed fracture: Secondary | ICD-10-CM

## 2019-10-08 DIAGNOSIS — E782 Mixed hyperlipidemia: Secondary | ICD-10-CM

## 2019-10-08 DIAGNOSIS — E1122 Type 2 diabetes mellitus with diabetic chronic kidney disease: Secondary | ICD-10-CM

## 2019-10-08 LAB — POCT GLYCOSYLATED HEMOGLOBIN (HGB A1C): Hemoglobin A1C: 6.5 % — AB (ref 4.0–5.6)

## 2019-10-08 MED ORDER — HYDROCODONE-ACETAMINOPHEN 7.5-325 MG PO TABS: 1.0000 | ORAL_TABLET | ORAL | 0 refills | Status: AC | PRN

## 2019-10-08 NOTE — Progress Notes (Signed)
NEW PROBLEM//OFFICE VISIT  Chief Complaint  Patient presents with   Foot Pain    left foot, hurts all the time.     59 had a chair injured her left foot on 09/30/2019.  Complains of pain went to the ER x-rays were obtained she has 1/5 metatarsal spiral nondisplaced fracture on the left foot.  She was in a posterior splint in a postop shoe with poor pain control after taking a few Vicodin.   Review of Systems  Psychiatric/Behavioral: Depression:    All other systems reviewed and are negative.    Past Medical History:  Diagnosis Date   Anal fissure    Anxiety    Chronic systolic (congestive) heart failure ([place])    a. 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31   Mixed hyperlipidemia    Nonischemic cardiomyopathy ([place])    Type 2 diabetes mellitus ([place])     Past Surgical History:  Procedure Laterality Date   BACK SURGERY  2005   lumbar disckectomy   COLONOSCOPY  10/01/2011   Procedure: COLONOSCOPY;  Surgeon: [name], MD;  [facility]: [facility];  Service: Endoscopy;  Laterality: N/A;  730   COLONOSCOPY N/A 10/04/2016   Procedure: COLONOSCOPY;  Surgeon: [name], MD;  Location: [facility];  Service: Endoscopy;  Laterality: N/A;  10:30   DILATION AND CURETTAGE OF UTERUS     FOOT BONE EXCISION     right   POLYPECTOMY  10/04/2016   Procedure: POLYPECTOMY;  Surgeon: [name], MD;  Location: [facility];  Service: Endoscopy;;  colon   RIGHT/LEFT HEART CATH AND CORONARY ANGIOGRAPHY N/A 03/23/2019   Procedure: RIGHT/LEFT HEART CATH AND CORONARY ANGIOGRAPHY;  Surgeon: [name], MD;  Location: [facility];  Service: Cardiovascular;  Laterality: N/A;    Family History  Problem Relation Age of Onset   Arthritis Mother    Hernia Mother    Constipation Mother    Heart  failure Father        Underwent heart transplant   Colon cancer Neg Hx    Social History   Tobacco Use   Smoking status: Current Every Day Smoker    Packs/day: 0.25    Years: 37.00    Pack years: 9.25    Types: Cigarettes   Smokeless tobacco: Never Used   Tobacco comment: 1-2 cigarettes daily  Vaping Use   Vaping Use: Never used  Substance Use Topics   Alcohol use: No   Drug use: No    No Known Allergies  Current Meds  Medication Sig   Accu-Chek FastClix Lancets MISC Apply topically 2 (two) times daily.   ACCU-CHEK GUIDE test strip USE TO TEST TWICE DAILY.O   acetaminophen (TYLENOL 8 HOUR) 650 MG CR tablet Take 1 tablet (650 mg total) by mouth every 8 (eight) hours as needed for pain or fever.   ALPRAZolam (XANAX) 1 MG tablet Take 1 mg by mouth at bedtime.   atorvastatin (LIPITOR) 40 MG tablet Take 40 mg by mouth daily.   buPROPion (WELLBUTRIN SR) 150 MG 12 hr tablet Take 150 mg by mouth daily.   carvedilol (COREG) 6.25 MG tablet Take 1 tablet (6.25 mg total) by mouth 2 (two) times daily with a meal.   Cholecalciferol (VITAMIN  D3) 125 MCG (5000 UT) CAPS Take 1 capsule by mouth daily.   dapagliflozin propanediol (FARXIGA) 5 MG TABS tablet Take 1 tablet (5 mg total) by mouth daily.   FLUoxetine (PROZAC) 40 MG capsule Take 40 mg by mouth daily.   fluticasone (FLONASE) 50 MCG/ACT nasal spray Place 2 sprays into both nostrils daily.   glipiZIDE (GLUCOTROL) 5 MG tablet Take 5 mg by mouth 2 (two) times daily before a meal.    loperamide (IMODIUM) 2 MG capsule TAKE 2 CAPSULES BY MOUTH INITIALLY, THEN TAKE 1 CAPSULE AFTER EACH LOOSE STOOL. DO NOT EXCEED 8 CAPSULES IN 24 HOURS.   loratadine (CLARITIN) 10 MG tablet Take 10 mg by mouth daily.   LORazepam (ATIVAN) 0.5 MG tablet Take 0.5 mg by mouth 3 (three) times daily.    Melatonin (SM MELATONIN) 3 MG TABS Take 3 mg by mouth at bedtime.   pantoprazole (PROTONIX) 40 MG tablet Take 1 tablet (40 mg total) by mouth  daily.   sacubitril-valsartan (ENTRESTO) 24-26 MG Take 1 tablet by mouth 2 (two) times daily.   spironolactone (ALDACTONE) 25 MG tablet Take 0.5 tablets (12.5 mg total) by mouth daily.   [DISCONTINUED] HYDROcodone-acetaminophen (NORCO/VICODIN) 5-325 MG tablet Take 2 tablets by mouth every 4 (four) hours as needed.    BP 102/70    Pulse 82    Ht 5\' 6"  (1.676 m)    LMP 09/25/2011    BMI 27.86 kg/m   Physical Exam Constitutional:      General: She is not in acute distress.    Appearance: She is well-developed.  Cardiovascular:     Comments: No peripheral edema Skin:    General: Skin is warm and dry.  Neurological:     Mental Status: She is alert and oriented to person, place, and time.     Sensory: No sensory deficit.     Coordination: Coordination normal.     Gait: Gait abnormal.     Deep Tendon Reflexes: Reflexes are normal and symmetric.     Ortho Exam Left foot tenderness swelling skin ecchymotic range of motion ankle normal no instability at the ankle joint muscle tone normal   MEDICAL DECISION MAKING  A.  Encounter Diagnosis  Name Primary?   Closed nondisplaced fracture of fifth metatarsal bone of left foot, initial encounter Yes    B. DATA ANALYSED:    IMAGING: Independent interpretation of images: The x-ray shows a nondisplaced fracture of the shaft it is spiral no angulation alignment good  Orders: No new orders  Outside records reviewed: ER record  C. MANAGEMENT Short cam walker x-ray at 4 weeks and 8 weeks  Meds ordered this encounter  Medications   HYDROcodone-acetaminophen (NORCO) 7.5-325 MG tablet    Sig: Take 1 tablet by mouth every 4 (four) hours as needed for up to 5 days for moderate pain.    Dispense:  30 tablet    Refill:  0      Arther Abbott, MD  10/08/2019 3:06 PM

## 2019-10-08 NOTE — Patient Instructions (Signed)

## 2019-10-08 NOTE — Progress Notes (Signed)
10/08/2019, 1:20 PM  Endocrinology follow-up note   Subjective:    Patient ID: [name], female    DOB: Nov 20, 1960.  [name] is being seen in follow-up after she was seen in consultation for management of currently uncontrolled symptomatic diabetes requested by  [name], MD.   Past Medical History:  Diagnosis Date  . Anal fissure   . Anxiety   . Chronic systolic (congestive) heart failure (HCC)    a. EF 20-25% by echo in 02/2019 with cath showing no significant CAD  . Depression   . Essential hypertension   . GERD (gastroesophageal reflux disease)   . History of seizures as a child    None since age 36  . Mixed hyperlipidemia   . Nonischemic cardiomyopathy ([place])   . Type 2 diabetes mellitus ([place])     Past Surgical History:  Procedure Laterality Date  . BACK SURGERY  2005   lumbar disckectomy  . COLONOSCOPY  10/01/2011   Procedure: COLONOSCOPY;  Surgeon: Mckenzie Houston, MD;  [facility];  Service: Endoscopy;  Laterality: N/A;  730  . COLONOSCOPY N/A 10/04/2016   Procedure: COLONOSCOPY;  Surgeon: Mckenzie Houston, MD;  [facility];  Service: Endoscopy;  Laterality: N/A;  10:30  . DILATION AND CURETTAGE OF UTERUS    . FOOT BONE EXCISION     right  . POLYPECTOMY  10/04/2016   Procedure: POLYPECTOMY;  Surgeon: Mckenzie Houston, MD;  [facility]: [facility];  Service: Endoscopy;;  colon  . RIGHT/LEFT HEART CATH AND CORONARY ANGIOGRAPHY N/A 03/23/2019   Procedure: RIGHT/LEFT HEART CATH AND CORONARY ANGIOGRAPHY;  Surgeon: Mckenzie Dresser, MD;  Location: [facility];  Service: Cardiovascular;  Laterality: N/A;    Social History   Socioeconomic History  . Marital status: Divorced    Spouse name: Not on file  . Number of children: Not on file  . Years of education: Not on file  . Highest education level: Not on file  Occupational History  . Not on file  Tobacco  Use  . Smoking status: Current Every Day Smoker    Packs/day: 0.25    Years: 37.00    Pack years: 9.25    Types: Cigarettes  . Smokeless tobacco: Never Used  . Tobacco comment: 1-2 cigarettes daily  Vaping Use  . Vaping Use: Never used  Substance and Sexual Activity  . Alcohol use: No  . Drug use: No  . Sexual activity: Yes    Birth control/protection: None  Other Topics Concern  . Not on file  Social History Narrative  . Not on file   Social Determinants of Health   Financial Resource Strain:   . Difficulty of Paying Living Expenses:   Food Insecurity:   . Worried About Charity fundraiser in the Last Year:   . Arboriculturist in the Last Year:   Transportation Needs:   . Film/video editor (Medical):   [name] Lack of Transportation (Non-Medical):   Physical Activity:   . Days of Exercise per Week:   . Minutes of Exercise per Session:   Stress:   . Feeling of Stress :  Social Connections:   . Frequency of Communication with Friends and Family:   . Frequency of Social Gatherings with Friends and Family:   . Attends Religious Services:   . Active Member of Clubs or Organizations:   . Attends Archivist Meetings:   [name] Marital Status:     Family History  Problem Relation Age of Onset  . Arthritis Mother   . Hernia Mother   . Constipation Mother   . Heart failure Father        Underwent heart transplant  . Colon cancer Neg Hx     Outpatient Encounter Medications as of 10/08/2019  Medication Sig  . Accu-Chek FastClix Lancets MISC Apply topically 2 (two) times daily.  [name] ACCU-CHEK GUIDE test strip USE TO TEST TWICE DAILY.O  . acetaminophen (TYLENOL 8 HOUR) 650 MG CR tablet Take 1 tablet (650 mg total) by mouth every 8 (eight) hours as needed for pain or fever.  . ALPRAZolam (XANAX) 1 MG tablet Take 1 mg by mouth at bedtime.  [name] atorvastatin (LIPITOR) 40 MG tablet Take 40 mg by mouth daily.  [name] buPROPion (WELLBUTRIN SR) 150 MG 12 hr tablet Take 150 mg by  mouth daily.  . carvedilol (COREG) 6.25 MG tablet Take 1 tablet (6.25 mg total) by mouth 2 (two) times daily with a meal.  . Cholecalciferol (VITAMIN D3) 125 MCG (5000 UT) CAPS Take 1 capsule by mouth daily.  . dapagliflozin propanediol (FARXIGA) 5 MG TABS tablet Take 1 tablet (5 mg total) by mouth daily.  [name] FLUoxetine (PROZAC) 40 MG capsule Take 40 mg by mouth daily.  . fluticasone (FLONASE) 50 MCG/ACT nasal spray Place 2 sprays into both nostrils daily.  [name] glipiZIDE (GLUCOTROL) 5 MG tablet Take 5 mg by mouth 2 (two) times daily before a meal.   . HYDROcodone-acetaminophen (NORCO/VICODIN) 5-325 MG tablet Take 2 tablets by mouth every 4 (four) hours as needed.  . loperamide (IMODIUM) 2 MG capsule TAKE 2 CAPSULES BY MOUTH INITIALLY, THEN TAKE 1 CAPSULE AFTER EACH LOOSE STOOL. DO NOT EXCEED 8 CAPSULES IN 24 HOURS.  [name] loratadine (CLARITIN) 10 MG tablet Take 10 mg by mouth daily.  [name] LORazepam (ATIVAN) 0.5 MG tablet Take 0.5 mg by mouth 3 (three) times daily.   . Melatonin (SM MELATONIN) 3 MG TABS Take 3 mg by mouth at bedtime.  . pantoprazole (PROTONIX) 40 MG tablet Take 1 tablet (40 mg total) by mouth daily.  . sacubitril-valsartan (ENTRESTO) 24-26 MG Take 1 tablet by mouth 2 (two) times daily.  [name] spironolactone (ALDACTONE) 25 MG tablet Take 0.5 tablets (12.5 mg total) by mouth daily.   No facility-administered encounter medications on file as of 10/08/2019.    ALLERGIES: No Known Allergies  VACCINATION STATUS:  There is no immunization history on file for this patient.  Diabetes She presents for her follow-up diabetic visit. She has type 2 diabetes mellitus. Onset time: She was diagnosed at approximate age of 59 years. Her disease course has been improving (Since her last visit, she fell and sustained fracture on left foot.  She is awaiting consult with orthopedic surgery.). There are no hypoglycemic associated symptoms. Pertinent negatives for hypoglycemia include no confusion, headaches,  pallor or seizures. Associated symptoms include fatigue. Pertinent negatives for diabetes include no chest pain, no polydipsia, no polyphagia and no polyuria. There are no hypoglycemic complications. Symptoms are improving. Diabetic complications include heart disease and nephropathy. Risk factors for coronary artery disease include dyslipidemia, diabetes mellitus, obesity, sedentary lifestyle, post-menopausal  and tobacco exposure. Current diabetic treatments: She reports that recently her diabetes regimen has been completely changed .  Instead of her glipizide currently taking Farxiga 10 mg p.o. daily. Her weight is decreasing steadily. She is following a generally unhealthy diet. When asked about meal planning, she reported none. She has not had a previous visit with a dietitian. She rarely participates in exercise. Her home blood glucose trend is increasing steadily. Her breakfast blood glucose range is generally 130-140 mg/dl. Her bedtime blood glucose range is generally 140-180 mg/dl. Her overall blood glucose range is 140-180 mg/dl. (She presents with controlled glycemic profile both fasting and postprandial.  Her point-of-care A1c 6.5%, improving from 8.1%.       ) An ACE inhibitor/angiotensin II receptor blocker is being taken. Eye exam is current.  Hyperlipidemia This is a chronic problem. The current episode started more than 1 year ago. Exacerbating diseases include chronic renal disease, diabetes and obesity. Pertinent negatives include no chest pain, myalgias or shortness of breath. Current antihyperlipidemic treatment includes statins. Risk factors for coronary artery disease include dyslipidemia, diabetes mellitus, obesity, a sedentary lifestyle and post-menopausal.  Hypertension This is a chronic problem. The current episode started more than 1 year ago. The problem is controlled. Pertinent negatives include no chest pain, headaches, palpitations or shortness of breath. Risk factors for  coronary artery disease include diabetes mellitus, dyslipidemia, obesity, smoking/tobacco exposure, sedentary lifestyle and post-menopausal state. Past treatments include ACE inhibitors. Hypertensive end-organ damage includes kidney disease and heart failure. Identifiable causes of hypertension include chronic renal disease.    Review of Systems  Constitutional: Positive for fatigue. Negative for chills, fever and unexpected weight change.  HENT: Negative for trouble swallowing and voice change.   Eyes: Negative for visual disturbance.  Respiratory: Negative for cough, shortness of breath and wheezing.   Cardiovascular: Negative for chest pain, palpitations and leg swelling.  Gastrointestinal: Negative for diarrhea, nausea and vomiting.  Endocrine: Negative for cold intolerance, heat intolerance, polydipsia, polyphagia and polyuria.  Musculoskeletal: Negative for arthralgias and myalgias.  Skin: Negative for color change, pallor, rash and wound.  Neurological: Negative for seizures and headaches.  Psychiatric/Behavioral: Negative for confusion and suicidal ideas.    Objective:    Vitals with BMI 10/08/2019 10/02/2019 10/01/2019  Height 5\' 6"  5\' 7"  -  Weight - 172 lbs 10 oz -  BMI - 23.76 -  Systolic 98 88 283  Diastolic 67 62 57  Pulse 76 76 63    BP 98/67   Pulse 76   Ht 5\' 6"  (1.676 m)   LMP 09/25/2011   BMI 27.86 kg/m   Wt Readings from Last 3 Encounters:  10/02/19 172 lb 9.6 oz (78.3 kg)  10/01/19 172 lb (78 kg)  08/20/19 177 lb 12.8 oz (80.6 kg)     Physical Exam Constitutional:      Appearance: She is well-developed.  HENT:     Head: Normocephalic and atraumatic.  Neck:     Thyroid: No thyromegaly.     Trachea: No tracheal deviation.  Cardiovascular:     Rate and Rhythm: Normal rate and regular rhythm.  Pulmonary:     Effort: Pulmonary effort is normal.  Abdominal:     Tenderness: There is no abdominal tenderness. There is no guarding.  Musculoskeletal:         General: Normal range of motion.     Cervical back: Normal range of motion and neck supple.  Skin:    General: Skin is warm and  dry.     Coloration: Skin is not pale.     Findings: No erythema or rash.  Neurological:     Mental Status: She is alert and oriented to person, place, and time.     Cranial Nerves: No cranial nerve deficit.     Coordination: Coordination normal.     Deep Tendon Reflexes: Reflexes are normal and symmetric.  Psychiatric:        Judgment: Judgment normal.     CMP ( most recent) CMP     Component Value Date/Time   NA 140 10/02/2019 1228   NA 139 07/15/2019 0825   K 4.1 10/02/2019 1228   CL 107 10/02/2019 1228   CO2 20 (L) 10/02/2019 1228   GLUCOSE 103 (H) 10/02/2019 1228   BUN 40 (H) 10/02/2019 1228   BUN 27 (H) 07/15/2019 0825   CREATININE 1.28 (H) 10/02/2019 1228   CREATININE 1.12 (H) 06/05/2019 0710   CALCIUM 9.4 10/02/2019 1228   PROT 6.7 03/20/2019 1658   ALBUMIN 3.5 03/20/2019 1658   AST 17 03/20/2019 1658   ALT 23 03/20/2019 1658   ALKPHOS 104 03/20/2019 1658   BILITOT 0.7 03/20/2019 1658   GFRNONAA 46 (L) 10/02/2019 1228   GFRAA 54 (L) 10/02/2019 1228     Diabetic Labs (most recent): Lab Results  Component Value Date   HGBA1C 6.5 (A) 10/08/2019   HGBA1C 8.1 (H) 06/05/2019   HGBA1C 7.8 (H) 03/20/2019    Assessment & Plan:   1. Diabetes mellitus with stage 3 chronic kidney disease ([place])   - LANYAH SPENGLER has currently uncontrolled symptomatic type 2 DM since  59 years of age.  She presents with controlled glycemic profile both fasting and postprandial.  Her point-of-care A1c 6.5%, improving from 8.1%.    Recent labs reviewed. - I had a long discussion with her about the progressive nature of diabetes and the pathology behind its complications. -her diabetes is complicated by CHF, CKD stage 2-3 , smoking and she remains at a high risk for more acute and chronic complications which include CAD, CVA, CKD, retinopathy, and  neuropathy. These are all discussed in detail with her.  - I have counseled her on diet  and weight management  by adopting a carbohydrate restricted/protein rich diet. Patient is encouraged to switch to  unprocessed or minimally processed     complex starch and increased protein intake (animal or plant source), fruits, and vegetables. -  she is advised to stick to a routine mealtimes to eat 3 meals  a day and avoid unnecessary snacks ( to snack only to correct hypoglycemia).    - she  admits there is a room for improvement in her diet and drink choices. -  Suggestion is made for her to avoid simple carbohydrates  from her diet including Cakes, Sweet Desserts / Pastries, Ice Cream, Soda (diet and regular), Sweet Tea, Candies, Chips, Cookies, Sweet Pastries,  Store Bought Juices, Alcohol in Excess of  1-2 drinks a day, Artificial Sweeteners, Coffee Creamer, and "Sugar-free" Products. This will help patient to have stable blood glucose profile and potentially avoid unintended weight gain.  - she will be scheduled with Jearld Fenton, RDN, CDE for diabetes education.  - I have approached her with the following individualized plan to manage  her diabetes and patient agrees:   -In light of her presentation with controlled glycemic profile, she would not need insulin treatment for now.  She is advised to continue glipizide 5 mg p.o. twice  daily.  She did not tolerate Metformin. -She was recently switched by her cardiologist to  [facility] , currently 5 mg daily.   -Since her last visit, patient  fell and sustained minimally displaced fracture of fifth metatarsal on left lower extremity.  SGLT2 inhibitors, including Farxiga, generally increase risk for fractures, not clear at this point if Wilder Glade contributed to her fracture.  She is getting this medication from her cardiologist, she  would like to discuss this option with them during her next visit.  --She agrees to continue monitoring blood glucose twice  daily-daily before breakfast and at bedtime.  - she is encouraged to call clinic for blood glucose levels less than 70 or above 200 mg /dl. - she is not a suitable candidate for incretin therapy either due to her body habitus as well as chronic smoking history, posing risk for pancreatitis. -Addition of basal insulin would be the safest option if she loses control on subsequent visits.   - Specific targets for  A1c;  LDL, HDL,  and Triglycerides were discussed with the patient.  2) Blood Pressure /Hypertension:  -Her blood pressure is tightly controlled, 98/67.  she is not symptomatic.  she is advised to continue her current medications including Entresto 24/26 mg p.o. daily with breakfast .  She also has carvedilol 6.25 mg p.o. twice daily as well as spironolactone 25 mg p.o. daily.   3) Lipids/Hyperlipidemia:   She does not have recent lipid panel.   She is advised to continue atorvastatin 40 mg p.o. nightly.    Side effects and precautions discussed with her.  4)  Weight/Diet:  Body mass index is 27.86 kg/m.  -She is a candidate for some weight loss. I discussed with her the fact that loss of 5 - 10% of her  current body weight will have the most impact on her diabetes management.  Exercise, and detailed carbohydrates information provided  -  detailed on discharge instructions.  The patient was counseled on the dangers of tobacco use, and was advised to quit.  Reviewed strategies to maximize success, including removing cigarettes and smoking materials from environment.   5) Chronic Care/Health Maintenance:  -she  is on ACEI/ARB and Statin medications and  is encouraged to initiate and continue to follow up with Ophthalmology, Dentist, orthopedic surgeon, cardiology, podiatrist at least yearly or according to recommendations, and advised to  quit smoking. I have recommended yearly flu vaccine and pneumonia vaccine at least every 5 years; moderate intensity exercise for up to 150 minutes  weekly; and  sleep for at least 7 hours a day.  - she is  advised to maintain close follow up with [name], MD for primary care needs, as well as her other providers for optimal and coordinated care.   - Time spent on this patient care encounter:  35 min, of which > 50% was spent in  counseling and the rest reviewing her blood glucose logs , discussing her hypoglycemia and hyperglycemia episodes, reviewing her current and  previous labs / studies  ( including abstraction from other facilities) and medications  doses and developing a  long term treatment plan and documenting her care.   Please refer to Patient Instructions for Blood Glucose Monitoring and Insulin/Medications Dosing Guide"  in media tab for additional information. Please  also refer to " Patient Self Inventory" in the Media  tab for reviewed elements of pertinent patient history.  Orbie Pyo participated in the discussions, expressed understanding, and voiced agreement with  the above plans.  All questions were answered to her satisfaction. she is encouraged to contact clinic should she have any questions or concerns prior to her return visit.   Follow up plan: - Return in about 4 months (around 02/07/2020) for Bring Meter and Logs- A1c in Office.  Glade Lloyd, MD [facility] [address] [place], [place] [address] Phone: [phone]  Fax: [phone]    10/08/2019, 1:20 PM  This note was partially dictated with voice recognition software. Similar sounding words can be transcribed inadequately or may not  be corrected upon review.

## 2019-10-15 ENCOUNTER — Other Ambulatory Visit (HOSPITAL_COMMUNITY): Payer: Self-pay | Admitting: Cardiology

## 2019-10-19 ENCOUNTER — Other Ambulatory Visit (HOSPITAL_COMMUNITY): Payer: Self-pay | Admitting: Cardiology

## 2019-11-04 DIAGNOSIS — S92355D Nondisplaced fracture of fifth metatarsal bone, left foot, subsequent encounter for fracture with routine healing: Secondary | ICD-10-CM | POA: Insufficient documentation

## 2019-11-04 NOTE — Progress Notes (Signed)
PCP: [name], MD PCP-Cardiologist: Rozann Lesches, MD  HF Cardiology: Dr. Aundra Dubin   HPI:  59 y.o. female w/ history of DM, HTN, hyperlipidemia, and smoking. She was admitted on 1/22 for about 1 week of shortness of breath. She had been started on azithromycin and prednisone by PCP and was taking inhalers for ?COPD exacerbation without relief. COVID negative. Dyspnea primarily exertional, no orthopnea. No chest pain reported. In ER at [facility], pro-BNP was elevated and CXR suggestive of volume overload. Slightly elevated HS-TnI with no trend, not suggestive of ACS.She was admitted for management of new diagnosis of CHF and seen by Dr. Domenic Polite. Echo was done, showed EF 20-25% with mild RV dysfunction. She was started on IV Lasix then transitioned to PO. She was transferred from [facility] to [facility] for RHC/LHC to assess filling pressures/CO and also to assess for coronary disease as etiology.   Of note, her father developed CHF in his 63s, nonischemic cardiomyopathy. He had ICD and later heart transplant.  Grandmother also had CHF.   Cathwas done 03/23/19 andshowedno significant coronary disease and optimized filling pressures with preserved cardiac output (see angiographic and hemodynamic data below).Diagnosed with NICM. cMRI showed no myocardial LGE, so no definitive evidence for prior MI, infiltrative disease, or cardiomyopathy. RV normal with EF 35%. She was started on GDMT with ARB,?blocker and MRA + loop diuretic. Actos for DM was discontinued with plan to consider eventual SGLT2i in the future.   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 Broadus John, thought to be probably be a benign variant.   Echo 5/21 with EF 30-35% (closer to 35%), diffuse hypokinesis, mildly decreased RV systolic function, normal IVC.   She recently presented to HF Clinic on 10/02/19 with Dr. Aundra Dubin.  She was smoking 1-2 cigs/day, now on bupropion.  She kicked a sofa  by accident and fractured a bone in her foot, now wearing a boot.  No significant exertional dyspnea.  Main complaint was foot pain, using a walker.  No orthopnea/PND.  No chest pain. Weight was down 11 lbs.  She was only taking dapagliflozin 2.5 mg daily. BP was running low but no lightheadedness or falls.   Today she returns to HF clinic for pharmacist medication titration. At last visit with MD, dapagliflozin was increased to 5 mg daily. Overall she is feeling well today. Still wearing a boot for her fractured foot, but will see ortho on 12/03/19 for repeat X-ray and potential removal. No dizziness, lightheadedness, chest pain or palpitations. No SOB/DOE. Her weight has been stable at home. She does not take any diuretic. No LEE, PND or orthopnea. Taking all medications as prescribed and tolerating all medications.    HF Medications: Carvedilol 6.25 mg BID Entresto 24/26 mg BID Spironolactone 12.5 mg daily Dapagliflozin 5 mg daily  Has the patient been experiencing any side effects to the medications prescribed?  no  Does the patient have any problems obtaining medications due to transportation or finances?   No - has [place] Medicaid  Understanding of regimen: good Understanding of indications: good Potential of compliance: good Patient understands to avoid NSAIDs. Patient understands to avoid decongestants.    Pertinent Lab Values (10/02/19): [name] Serum creatinine 1.28, BUN 40, Potassium 4.1, Sodium 140  Vital Signs: . Weight: 174 lbs (last clinic weight: 172.6 lbs) . Blood pressure: 104/62  . Heart rate: 82   Assessment: 1. Chronic Systolic CHF: Echo 3/81 w/ EF 20-25%, mild RV dysfunction. No prior cardiac history but  father has history of nonischemic cardiomyopathy withheart transplant and paternal grandmother also had CHF.  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 Broadus John, probably benign variant.LHC/RHC 1/21 showed no significant coronary  disease and optimized filling pressures with preserved cardiac output. cMRi showed no myocardial LGE, so no definitive evidence for prior MI, infiltrative disease, or cardiomyopathy.  Echo 5/21 showed mild improvement in LV function, EF 30-35% (probably closer to 35%).   - She is not volume overloaded, NYHA class 2 symptoms.   - Increase carvedilol to 9.375 mg BID. - Continue Entresto 24-26 mg BID - Continue Spironolactone 12.5 mg daily  - Continue dapagliflozin 5 mg daily.  - EF < 35% but with some improvement. Repeat echo again in 3 months at follow-up (12/2019), and if EF not > 35, will refer for ICD (not CRT candidate).   - Discussed continuation of low sodium diet and daily weights 2. Smoking: She has almost quit.  - Continue bupropion.  3. HTN: BP now low.  4. Type 2 diabetes: Continue dapagliflozion and glipizide.    Plan: 1) Medication changes: Based on clinical presentation, vital signs and recent labs will increase carvedilol to 9.375 mg BID 2) Follow-up: 6 weeks with Dr. Rush Farmer, [name], BCPS, [facility], [facility] Pharmacist [phone]

## 2019-11-05 ENCOUNTER — Ambulatory Visit: Payer: Medicaid Other

## 2019-11-05 ENCOUNTER — Encounter: Payer: Self-pay | Admitting: Orthopedic Surgery

## 2019-11-05 ENCOUNTER — Other Ambulatory Visit: Payer: Self-pay

## 2019-11-05 ENCOUNTER — Ambulatory Visit (INDEPENDENT_AMBULATORY_CARE_PROVIDER_SITE_OTHER): Payer: Medicaid Other | Admitting: Orthopedic Surgery

## 2019-11-05 VITALS — Ht 66.0 in | Wt 172.0 lb

## 2019-11-05 DIAGNOSIS — S92355D Nondisplaced fracture of fifth metatarsal bone, left foot, subsequent encounter for fracture with routine healing: Secondary | ICD-10-CM

## 2019-11-05 NOTE — Patient Instructions (Signed)
Stay in boot Fracture healing, but not healed yet

## 2019-11-05 NOTE — Progress Notes (Signed)
Chief Complaint  Patient presents with  . Fracture    Lt foot    Encounter Diagnosis  Name Primary?  . Closed nondisplaced fracture of fifth metatarsal bone of left foot with routine healing, subsequent encounter 09/30/19  Yes   5th met fx lt foot   C/o pain  trx w cam walker   xarys fx healing  Cont boot   xr 4 weeks

## 2019-11-16 ENCOUNTER — Ambulatory Visit (HOSPITAL_COMMUNITY)
Admission: RE | Admit: 2019-11-16 | Discharge: 2019-11-16 | Disposition: A | Discharge status: Home / Self Care | Payer: Medicaid Other | Source: Ambulatory Visit | Attending: Cardiology | Admitting: Cardiology

## 2019-11-16 ENCOUNTER — Other Ambulatory Visit: Payer: Self-pay

## 2019-11-16 VITALS — BP 104/62 | HR 82 | Wt 174.0 lb

## 2019-11-16 DIAGNOSIS — I5022 Chronic systolic (congestive) heart failure: Secondary | ICD-10-CM

## 2019-11-16 DIAGNOSIS — Z87891 Personal history of nicotine dependence: Secondary | ICD-10-CM | POA: Diagnosis not present

## 2019-11-16 DIAGNOSIS — E785 Hyperlipidemia, unspecified: Secondary | ICD-10-CM | POA: Diagnosis not present

## 2019-11-16 DIAGNOSIS — Z8249 Family history of ischemic heart disease and other diseases of the circulatory system: Secondary | ICD-10-CM | POA: Diagnosis not present

## 2019-11-16 DIAGNOSIS — Z79899 Other long term (current) drug therapy: Secondary | ICD-10-CM | POA: Insufficient documentation

## 2019-11-16 DIAGNOSIS — Z7984 Long term (current) use of oral hypoglycemic drugs: Secondary | ICD-10-CM | POA: Insufficient documentation

## 2019-11-16 DIAGNOSIS — I110 Hypertensive heart disease with heart failure: Secondary | ICD-10-CM | POA: Insufficient documentation

## 2019-11-16 DIAGNOSIS — E119 Type 2 diabetes mellitus without complications: Secondary | ICD-10-CM | POA: Insufficient documentation

## 2019-11-16 DIAGNOSIS — Z7983 Long term (current) use of bisphosphonates: Secondary | ICD-10-CM | POA: Insufficient documentation

## 2019-11-16 MED ORDER — CARVEDILOL 6.25 MG PO TABS: 9.3750 mg | ORAL_TABLET | Freq: Two times a day (BID) | ORAL | 3 refills | Status: DC

## 2019-11-16 NOTE — Patient Instructions (Signed)
It was a pleasure seeing you today!  MEDICATIONS: -We are changing your medications today -Increase carvedilol to 9.375 mg (1.5 tablets) twice daily. -Call if you have questions about your medications.   NEXT APPOINTMENT: Return to clinic in 6 weeks with Dr. Aundra Dubin.  In general, to take care of your heart failure: -Limit your fluid intake to 2 Liters (half-gallon) per day.   -Limit your salt intake to ideally 2-3 grams (2000-3000 mg) per day. -Weigh yourself daily and record, and bring that "weight diary" to your next appointment.  (Weight gain of 2-3 pounds in 1 day typically means fluid weight.) -The medications for your heart are to help your heart and help you live longer.   -Please contact us before stopping any of your heart medications.  Call the clinic at [phone] with questions or to reschedule future appointments.

## 2019-12-03 ENCOUNTER — Encounter: Payer: Self-pay | Admitting: Orthopedic Surgery

## 2019-12-03 ENCOUNTER — Ambulatory Visit (INDEPENDENT_AMBULATORY_CARE_PROVIDER_SITE_OTHER): Payer: Medicaid Other | Admitting: Orthopedic Surgery

## 2019-12-03 ENCOUNTER — Other Ambulatory Visit: Payer: Self-pay

## 2019-12-03 ENCOUNTER — Ambulatory Visit: Payer: Medicaid Other

## 2019-12-03 VITALS — BP 74/52 | Ht 66.0 in | Wt 167.0 lb

## 2019-12-03 DIAGNOSIS — S92355D Nondisplaced fracture of fifth metatarsal bone, left foot, subsequent encounter for fracture with routine healing: Secondary | ICD-10-CM | POA: Diagnosis not present

## 2019-12-03 NOTE — Progress Notes (Signed)
Chief Complaint  Patient presents with  . Foot Injury    left 09/30/19 improving    Follow-up fracture fifth metatarsal left foot  X-ray shows bridging of the fracture patient's exam shows minimal tenderness she can wean herself from the boot over the next couple weeks follow-up as needed

## 2019-12-03 NOTE — Patient Instructions (Signed)
Wean from the boot

## 2020-01-04 ENCOUNTER — Other Ambulatory Visit: Payer: Self-pay

## 2020-01-04 ENCOUNTER — Encounter (HOSPITAL_COMMUNITY): Payer: Self-pay | Admitting: Cardiology

## 2020-01-04 ENCOUNTER — Ambulatory Visit (HOSPITAL_COMMUNITY)
Admission: RE | Admit: 2020-01-04 | Discharge: 2020-01-04 | Disposition: A | Discharge status: Home / Self Care | Payer: Medicaid Other | Source: Ambulatory Visit | Attending: Family Medicine | Admitting: Family Medicine

## 2020-01-04 ENCOUNTER — Ambulatory Visit (HOSPITAL_BASED_OUTPATIENT_CLINIC_OR_DEPARTMENT_OTHER)
Admission: RE | Admit: 2020-01-04 | Discharge: 2020-01-04 | Disposition: A | Discharge status: Home / Self Care | Payer: Medicaid Other | Source: Ambulatory Visit | Attending: Cardiology | Admitting: Cardiology

## 2020-01-04 VITALS — BP 102/68 | HR 70 | Wt 175.6 lb

## 2020-01-04 DIAGNOSIS — Z79899 Other long term (current) drug therapy: Secondary | ICD-10-CM | POA: Insufficient documentation

## 2020-01-04 DIAGNOSIS — I110 Hypertensive heart disease with heart failure: Secondary | ICD-10-CM | POA: Diagnosis present

## 2020-01-04 DIAGNOSIS — I5022 Chronic systolic (congestive) heart failure: Secondary | ICD-10-CM

## 2020-01-04 DIAGNOSIS — E119 Type 2 diabetes mellitus without complications: Secondary | ICD-10-CM | POA: Insufficient documentation

## 2020-01-04 DIAGNOSIS — F17210 Nicotine dependence, cigarettes, uncomplicated: Secondary | ICD-10-CM | POA: Diagnosis not present

## 2020-01-04 DIAGNOSIS — Z8249 Family history of ischemic heart disease and other diseases of the circulatory system: Secondary | ICD-10-CM | POA: Insufficient documentation

## 2020-01-04 DIAGNOSIS — Z7984 Long term (current) use of oral hypoglycemic drugs: Secondary | ICD-10-CM | POA: Insufficient documentation

## 2020-01-04 LAB — BASIC METABOLIC PANEL
Anion gap: 9 (ref 5–15)
BUN: 34 mg/dL — ABNORMAL HIGH (ref 6–20)
CO2: 24 mmol/L (ref 22–32)
Calcium: 9.6 mg/dL (ref 8.9–10.3)
Chloride: 106 mmol/L (ref 98–111)
Creatinine, Ser: 1.1 mg/dL — ABNORMAL HIGH (ref 0.44–1.00)
GFR, Estimated: 58 mL/min — ABNORMAL LOW (ref >60)
Glucose, Bld: 147 mg/dL — ABNORMAL HIGH (ref 70–99)
Potassium: 4.4 mmol/L (ref 3.5–5.1)
Sodium: 139 mmol/L (ref 135–145)

## 2020-01-04 LAB — ECHOCARDIOGRAM COMPLETE
Area-P 1/2: 2.73 cm2
S' Lateral: 3.5 cm

## 2020-01-04 MED ORDER — SPIRONOLACTONE 25 MG PO TABS: 25.0000 mg | ORAL_TABLET | Freq: Every day | ORAL | 1 refills | Status: DC

## 2020-01-04 MED ORDER — DAPAGLIFLOZIN PROPANEDIOL 10 MG PO TABS: 10.0000 mg | ORAL_TABLET | Freq: Every day | ORAL | 5 refills | Status: DC

## 2020-01-04 NOTE — Progress Notes (Signed)
Advanced Heart Failure Clinic Note   Referring Physician: PCP: [name], MD PCP-Cardiologist: Rozann Lesches, MD  HF Cardiology: Dr. Aundra Dubin   HPI:  59 y.o. female w/ history of DM, HTN, hyperlipidemia, and smoking.  She was admitted on 1/22 for about 1 week of shortness of breath. She had been started on azithromycin and prednisone by PCP and was taking inhalers for ?COPD exacerbation without relief.  COVID negative.  Dyspnea primarily exertional, no orthopnea. No chest pain reported.  In ER at [facility], pro-BNP was elevated and CXR suggestive of volume overload. Slightly elevated HS-TnI with no trend, not suggestive of ACS.  She was admitted for management of new diagnosis of CHF and seen by Dr. Domenic Polite.  Echo was done, showed EF 20-25% with mild RV dysfunction. She was started on IV Lasix for diuresed then transitioned to PO.  She was transferred from [facility] to [facility] for RHC/LHC to assess filling pressures/CO and also to assess for coronary disease as etiology.   Of note, her father developed CHF in his 53s, nonischemic cardiomyopathy.  He had ICD and later heart transplant.  Grandmother also had CHF.   Cath was done 1/25 and showed no significant coronary disease and optimized filling pressures with preserved cardiac output (see angiographic and hemodynamic data below). Diagnosed w/ NICM. cMRI showed no myocardial LGE, so no definitive evidence for prior MI, infiltrative disease, or cardiomyopathy. RV normal w/ EF 35%. She was started on GDMT w/ ARB, ? blocker and MRA + loop diuretic. Actos for DM was discontinued w/ plan to consider eventual SGLT2i in the future.   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 Broadus John, thought to be probably be a benign variant.   Echo 5/21 with EF 30-35% (closer to 35%), diffuse hypokinesis, mildly decreased RV systolic function, normal IVC. Echo was done today and reviewed, EF 45%, diffuse hypokinesis, mildly  decreased RV systolic function.   She presents for followup of CHF.  She is smoking 3-4 cigs/day, Wellbutrin did not help and she is now on nicotine patch.  Weight up 4 lbs.  No significant exertional dyspnea, really only limited by knee pain.  No chest pain.  No lightheadedness.  No orthopnea/PND.    Labs (8/21): K 4.5, creatinine 1.33  PMH: 1. Hyperlipidemia 2. Type 2 diabetes.  3. Chronic systolic CHF:  - Echo (2/99).  EF 20-25%, mildly decreased RV function.  - RHC/LHC (1/21): Nonobstructive CAD; mean RA 9, PA 28/5, mean PCWP 9, CI 2.37 - Cardiac MRI (1/21): LV EF 31%, RVEF 28%, No myocardial LGE.  - Echo (5/21): EF 30-35% (closer to 35%), diffuse hypokinesis, mildly decreased RV systolic function, normal IVC. 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  - Echo (11/21): EF 45%, diffuse hypokinesis, mildly decreased RV systolic function.   Review of Systems: All systems reviewed and negative except as per HPI.   Current Outpatient Medications  Medication Sig Dispense Refill  . Accu-Chek FastClix Lancets MISC Apply topically 2 (two) times daily.    [name] ACCU-CHEK GUIDE test strip USE TO TEST TWICE DAILY.O    . acetaminophen (TYLENOL 8 HOUR) 650 MG CR tablet Take 1 tablet (650 mg total) by mouth every 8 (eight) hours as needed for pain or fever. 20 tablet 0  . ALPRAZolam (XANAX) 1 MG tablet Take 2 mg by mouth at bedtime.     [name] alprazolam (XANAX) 2 MG  tablet Take 2 mg by mouth daily as needed.    [name] atorvastatin (LIPITOR) 40 MG tablet Take 40 mg by mouth daily.    [name] buPROPion (WELLBUTRIN SR) 150 MG 12 hr tablet Take 150 mg by mouth daily.    . carvedilol (COREG) 6.25 MG tablet Take 1.5 tablets (9.375 mg total) by mouth 2 (two) times daily with a meal. 270 tablet 3  . dapagliflozin propanediol (FARXIGA) 10 MG TABS tablet Take 1 tablet (10 mg total) by mouth daily. 30 tablet 5  . ENTRESTO 24-26 MG TAKE ONE TABLET BY MOUTH  TWO TIMES DAILY. 60 tablet 11  . FLUoxetine (PROZAC) 40 MG capsule Take 40 mg by mouth daily.    . fluticasone (FLONASE) 50 MCG/ACT nasal spray Place 2 sprays into both nostrils daily.    [name] glipiZIDE (GLUCOTROL) 5 MG tablet Take 5 mg by mouth 2 (two) times daily before a meal.     . loperamide (IMODIUM) 2 MG capsule TAKE 2 CAPSULES BY MOUTH INITIALLY, THEN TAKE 1 CAPSULE AFTER EACH LOOSE STOOL. DO NOT EXCEED 8 CAPSULES IN 24 HOURS.    [name] loratadine (CLARITIN) 10 MG tablet Take 10 mg by mouth daily.    [name] LORazepam (ATIVAN) 0.5 MG tablet Take 0.5 mg by mouth 3 (three) times daily.     . Melatonin (SM MELATONIN) 3 MG TABS Take 3 mg by mouth at bedtime.    . pantoprazole (PROTONIX) 40 MG tablet Take 1 tablet (40 mg total) by mouth daily. 30 tablet 0  . spironolactone (ALDACTONE) 25 MG tablet Take 1 tablet (25 mg total) by mouth daily. 90 tablet 1  . Cholecalciferol (VITAMIN D3) 125 MCG (5000 UT) CAPS Take 1 capsule by mouth daily.     No current facility-administered medications for this encounter.    No Known Allergies    Social History   Socioeconomic History  . Marital status: Divorced    Spouse name: Not on file  . Number of children: Not on file  . Years of education: Not on file  . Highest education level: Not on file  Occupational History  . Not on file  Tobacco Use  . Smoking status: Current Every Day Smoker    Packs/day: 0.25    Years: 37.00    Pack years: 9.25    Types: Cigarettes  . Smokeless tobacco: Never Used  . Tobacco comment: 1-2 cigarettes daily  Vaping Use  . Vaping Use: Never used  Substance and Sexual Activity  . Alcohol use: No  . Drug use: No  . Sexual activity: Yes    Birth control/protection: None  Other Topics Concern  . Not on file  Social History Narrative  . Not on file   Social Determinants of Health   Financial Resource Strain:   . Difficulty of Paying Living Expenses: Not on file  Food Insecurity:   . Worried About Charity fundraiser in  the Last Year: Not on file  . Ran Out of Food in the Last Year: Not on file  Transportation Needs:   . Lack of Transportation (Medical): Not on file  . Lack of Transportation (Non-Medical): Not on file  Physical Activity:   . Days of Exercise per Week: Not on file  . Minutes of Exercise per Session: Not on file  Stress:   . Feeling of Stress : Not on file  Social Connections:   . Frequency of Communication with Friends and Family: Not on file  . Frequency of Social  Gatherings with Friends and Family: Not on file  . Attends Religious Services: Not on file  . Active Member of Clubs or Organizations: Not on file  . Attends Archivist Meetings: Not on file  . Marital Status: Not on file  Intimate Partner Violence:   . Fear of Current or Ex-Partner: Not on file  . Emotionally Abused: Not on file  . Physically Abused: Not on file  . Sexually Abused: Not on file      Family History  Problem Relation Age of Onset  . Arthritis Mother   . Hernia Mother   . Constipation Mother   . Heart failure Father        Underwent heart transplant  . Colon cancer Neg Hx     Vitals:   01/04/20 1056  BP: 102/68  Pulse: 70  SpO2: 96%  Weight: 79.7 kg (175 lb 9.6 oz)     PHYSICAL EXAM: General: NAD Neck: No JVD, no thyromegaly or thyroid nodule.  Lungs: Occasional end expiratory wheezes CV: Nondisplaced PMI.  Heart regular S1/S2, no S3/S4, no murmur.  No peripheral edema.  No carotid bruit.  Normal pedal pulses.  Abdomen: Soft, nontender, no hepatosplenomegaly, no distention.  Skin: Intact without lesions or rashes.  Neurologic: Alert and oriented x 3.  Psych: Normal affect. Extremities: No clubbing or cyanosis.  HEENT: Normal.    ASSESSMENT & PLAN:  1. Chronic Systolic CHF: Echo 6/80 w/ EF 20-25%, mild RV dysfunction. No prior cardiac history but father has history of nonischemic cardiomyopathy withheart transplant and paternal grandmother also had CHF.  [employer] gene  testing showed FHL1 mutation of uncertain significance => she saw Dr. Broadus John, probably benign variant.LHC/RHC 1/21 showed no significant coronary disease and optimized filling pressures with preserved cardiac output. cMRI showed no myocardial LGE, so no definitive evidence for prior MI, infiltrative disease, or cardiomyopathy.  Echo 5/21 showed mild improvement in LV function, EF 30-35% (probably closer to 35%).  Echo was done today, EF up to 45%. She is not volume overloaded, NYHA class 2 symptoms.  BP soft but no lightheadedness.  - Continue Entresto 24-26 bid, will not increase with low BP.  - Increase spironolactone to 25 mg daily, BMET today and in 10 days.  - Continue Coreg 9.375 mg bid.  - Increase dapagliflozin to 10 mg daily.  - She is now out of ICD range. 2. Smoking: Still smoking a few cigs/day.   - Continue nicotine patch.  3. HTN: BP now low.  4. Type 2 diabetes: As above, increasing dapagliflozion.   Followup in 3 months.    Loralie Champagne, MD 01/04/20

## 2020-01-04 NOTE — Progress Notes (Signed)
  Echocardiogram 2D Echocardiogram has been performed.  Bobbye Charleston 01/04/2020, 10:57 AM

## 2020-01-04 NOTE — Patient Instructions (Addendum)
INCREASE Farxiga to 10mg  (1 tab) daily  INCREASE Spironolactone to 25mg  (1 tab) daily  Labs today and repeat in 10 days in [place] We will only contact you if something comes back abnormal or we need to make some changes. Otherwise no news is good news!  Your physician recommends that you schedule a follow-up appointment in: 3 months with Dr Aundra Dubin  Please call office at [phone] option [phone] if you have any questions or concerns.    At the [facility], you and your health needs are our priority. As part of our continuing mission to provide you with exceptional heart care, we have created designated Provider Care Teams. These Care Teams include your primary Cardiologist (physician) and Advanced Practice Providers (APPs- Physician Assistants and Nurse Practitioners) who all work together to provide you with the care you need, when you need it.   You may see any of the following providers on your designated Care Team at your next follow up: [name] Dr Glori Bickers . Dr Loralie Champagne . Darrick Grinder, NP . Lyda Jester, PA . Audry Riles, [name]   Please be sure to bring in all your medications bottles to every appointment.

## 2020-02-03 ENCOUNTER — Other Ambulatory Visit (HOSPITAL_COMMUNITY): Payer: Self-pay | Admitting: Cardiology

## 2020-02-10 ENCOUNTER — Encounter: Payer: Self-pay | Admitting: "Endocrinology

## 2020-02-10 ENCOUNTER — Other Ambulatory Visit: Payer: Self-pay

## 2020-02-10 ENCOUNTER — Ambulatory Visit (INDEPENDENT_AMBULATORY_CARE_PROVIDER_SITE_OTHER): Payer: Medicaid Other | Admitting: "Endocrinology

## 2020-02-10 VITALS — BP 114/62 | HR 74 | Ht 66.0 in

## 2020-02-10 DIAGNOSIS — I10 Essential (primary) hypertension: Secondary | ICD-10-CM

## 2020-02-10 DIAGNOSIS — E782 Mixed hyperlipidemia: Secondary | ICD-10-CM | POA: Diagnosis not present

## 2020-02-10 DIAGNOSIS — N182 Chronic kidney disease, stage 2 (mild): Secondary | ICD-10-CM | POA: Diagnosis not present

## 2020-02-10 DIAGNOSIS — E1122 Type 2 diabetes mellitus with diabetic chronic kidney disease: Secondary | ICD-10-CM | POA: Diagnosis not present

## 2020-02-10 LAB — POCT GLYCOSYLATED HEMOGLOBIN (HGB A1C): Hemoglobin A1C: 6.6 % — AB (ref 4.0–5.6)

## 2020-02-10 NOTE — Progress Notes (Signed)
02/10/2020, 11:53 AM  Endocrinology follow-up note   Subjective:    Patient ID: [name], female    DOB: [birth year].  [name] is being seen in follow-up after she was seen in consultation for management of currently uncontrolled symptomatic diabetes requested by  [name], MD.   Past Medical History:  Diagnosis Date  . Anal fissure   . Anxiety   . Chronic systolic (congestive) heart failure (HCC)    a. EF 20-25% by echo in 02/2019 with cath showing no significant CAD  . Depression   . Essential hypertension   . GERD (gastroesophageal reflux disease)   . History of seizures as a child    None since age 6  . Mixed hyperlipidemia   . Nonischemic cardiomyopathy ([place])   . Type 2 diabetes mellitus ([place])     Past Surgical History:  Procedure Laterality Date  . BACK SURGERY  2005   lumbar disckectomy  . COLONOSCOPY  10/01/2011   Procedure: COLONOSCOPY;  Surgeon: [name], MD;  [facility];  Service: Endoscopy;  Laterality: N/A;  730  . COLONOSCOPY N/A 10/04/2016   Procedure: COLONOSCOPY;  Surgeon: [name], MD;  [facility];  Service: Endoscopy;  Laterality: N/A;  10:30  . DILATION AND CURETTAGE OF UTERUS    . FOOT BONE EXCISION     right  . POLYPECTOMY  10/04/2016   Procedure: POLYPECTOMY;  Surgeon: [name], MD;  [facility];  Service: Endoscopy;;  colon  . RIGHT/LEFT HEART CATH AND CORONARY ANGIOGRAPHY N/A 03/23/2019   Procedure: RIGHT/LEFT HEART CATH AND CORONARY ANGIOGRAPHY;  Surgeon: [name], MD;  Location: [facility];  Service: Cardiovascular;  Laterality: N/A;    Social History   Socioeconomic History  . Marital status: Divorced    Spouse name: Not on file  . Number of children: Not on file  . Years of education: Not on file  . Highest education level: Not on file  Occupational History  . Not on file  Tobacco  Use  . Smoking status: Current Every Day Smoker    Packs/day: 0.25    Years: 37.00    Pack years: 9.25    Types: Cigarettes  . Smokeless tobacco: Never Used  . Tobacco comment: 1-2 cigarettes daily  Vaping Use  . Vaping Use: Never used  Substance and Sexual Activity  . Alcohol use: No  . Drug use: No  . Sexual activity: Yes    Birth control/protection: None  Other Topics Concern  . Not on file  Social History Narrative  . Not on file   Social Determinants of Health   Financial Resource Strain: Not on file  Food Insecurity: Not on file  Transportation Needs: Not on file  Physical Activity: Not on file  Stress: Not on file  Social Connections: Not on file    Family History  Problem Relation Age of Onset  . Arthritis Mother   . Hernia Mother   . Constipation Mother   . Heart failure Father        Underwent heart transplant  . Colon cancer Neg Hx     Outpatient Encounter Medications  as of 02/10/2020  Medication Sig  . Accu-Chek FastClix Lancets MISC Apply topically 2 (two) times daily.  [name] ACCU-CHEK GUIDE test strip USE TO TEST TWICE DAILY.O  . acetaminophen (TYLENOL 8 HOUR) 650 MG CR tablet Take 1 tablet (650 mg total) by mouth every 8 (eight) hours as needed for pain or fever.  . ALPRAZolam (XANAX) 1 MG tablet Take 2 mg by mouth at bedtime.   [name] alprazolam (XANAX) 2 MG tablet Take 2 mg by mouth daily as needed.  [name] atorvastatin (LIPITOR) 40 MG tablet Take 40 mg by mouth daily.  [name] buPROPion (WELLBUTRIN SR) 150 MG 12 hr tablet Take 150 mg by mouth daily.  . carvedilol (COREG) 6.25 MG tablet Take 1.5 tablets (9.375 mg total) by mouth 2 (two) times daily with a meal.  . Cholecalciferol (VITAMIN D3) 125 MCG (5000 UT) CAPS Take 1 capsule by mouth daily.  . dapagliflozin propanediol (FARXIGA) 10 MG TABS tablet Take 1 tablet (10 mg total) by mouth daily.  [name] ENTRESTO 24-26 MG TAKE ONE TABLET BY MOUTH TWO TIMES DAILY.  [name] FLUoxetine (PROZAC) 40 MG capsule Take 40 mg by mouth  daily.  . fluticasone (FLONASE) 50 MCG/ACT nasal spray Place 2 sprays into both nostrils daily.  [name] glipiZIDE (GLUCOTROL) 5 MG tablet Take 5 mg by mouth 2 (two) times daily before a meal.   . loperamide (IMODIUM) 2 MG capsule TAKE 2 CAPSULES BY MOUTH INITIALLY, THEN TAKE 1 CAPSULE AFTER EACH LOOSE STOOL. DO NOT EXCEED 8 CAPSULES IN 24 HOURS.  [name] loratadine (CLARITIN) 10 MG tablet Take 10 mg by mouth daily.  [name] LORazepam (ATIVAN) 0.5 MG tablet Take 0.5 mg by mouth 3 (three) times daily.   . Melatonin (SM MELATONIN) 3 MG TABS Take 3 mg by mouth at bedtime.  . pantoprazole (PROTONIX) 40 MG tablet Take 1 tablet (40 mg total) by mouth daily.  [name] spironolactone (ALDACTONE) 25 MG tablet Take 1 tablet (25 mg total) by mouth daily.   No facility-administered encounter medications on file as of 02/10/2020.    ALLERGIES: No Known Allergies  VACCINATION STATUS:  There is no immunization history on file for this patient.  Diabetes She presents for her follow-up diabetic visit. She has type 2 diabetes mellitus. Onset time: She was diagnosed at approximate age of 29 years. Her disease course has been stable (Since her last visit, she fell and sustained fracture on left foot.  She is awaiting consult with orthopedic surgery.). There are no hypoglycemic associated symptoms. Pertinent negatives for hypoglycemia include no confusion, headaches, pallor or seizures. Associated symptoms include fatigue. Pertinent negatives for diabetes include no chest pain, no polydipsia, no polyphagia and no polyuria. There are no hypoglycemic complications. Symptoms are stable. Diabetic complications include heart disease and nephropathy. Risk factors for coronary artery disease include dyslipidemia, diabetes mellitus, obesity, sedentary lifestyle, post-menopausal and tobacco exposure. Current diabetic treatments: She reports that recently her diabetes regimen has been completely changed .  Instead of her glipizide currently taking  Farxiga 10 mg p.o. daily. Her weight is fluctuating minimally. She is following a generally unhealthy diet. When asked about meal planning, she reported none. She has not had a previous visit with a dietitian. She rarely participates in exercise. There is no change in her home blood glucose trend. Her breakfast blood glucose range is generally 130-140 mg/dl. Her bedtime blood glucose range is generally 140-180 mg/dl. Her overall blood glucose range is 140-180 mg/dl. (She presents with controlled glycemic profile both fasting and  postprandial.  Her point-of-care A1c 6.6%, remaining stable.  She did not document any hypoglycemia.     ) An ACE inhibitor/angiotensin II receptor blocker is being taken. Eye exam is current.  Hyperlipidemia This is a chronic problem. The current episode started more than 1 year ago. Exacerbating diseases include chronic renal disease, diabetes and obesity. Pertinent negatives include no chest pain, myalgias or shortness of breath. Current antihyperlipidemic treatment includes statins. Risk factors for coronary artery disease include dyslipidemia, diabetes mellitus, obesity, a sedentary lifestyle and post-menopausal.  Hypertension This is a chronic problem. The current episode started more than 1 year ago. The problem is controlled. Pertinent negatives include no chest pain, headaches, palpitations or shortness of breath. Risk factors for coronary artery disease include diabetes mellitus, dyslipidemia, obesity, smoking/tobacco exposure, sedentary lifestyle and post-menopausal state. Past treatments include ACE inhibitors. Hypertensive end-organ damage includes kidney disease and heart failure. Identifiable causes of hypertension include chronic renal disease.    Review of Systems  Constitutional: Positive for fatigue. Negative for chills, fever and unexpected weight change.  HENT: Negative for trouble swallowing and voice change.   Eyes: Negative for visual disturbance.   Respiratory: Negative for cough, shortness of breath and wheezing.   Cardiovascular: Negative for chest pain, palpitations and leg swelling.  Gastrointestinal: Negative for diarrhea, nausea and vomiting.  Endocrine: Negative for cold intolerance, heat intolerance, polydipsia, polyphagia and polyuria.  Musculoskeletal: Negative for arthralgias and myalgias.  Skin: Negative for color change, pallor, rash and wound.  Neurological: Negative for seizures and headaches.  Psychiatric/Behavioral: Negative for confusion and suicidal ideas.    Objective:    Vitals with BMI 02/10/2020 01/04/2020 12/03/2019  Height 5\' 6"  - 5\' 6"   Weight - 175 lbs 10 oz 167 lbs  BMI - - 02.58  Systolic 527 782 74  Diastolic 62 68 52  Pulse 74 70 -    BP 114/62   Pulse 74   Ht 5\' 6"  (1.676 m)   LMP 09/25/2011   BMI 28.34 kg/m   Wt Readings from Last 3 Encounters:  01/04/20 175 lb 9.6 oz (79.7 kg)  12/03/19 167 lb (75.8 kg)  11/16/19 174 lb (78.9 kg)     Physical Exam Constitutional:      Appearance: She is well-developed.  HENT:     Head: Normocephalic and atraumatic.  Neck:     Thyroid: No thyromegaly.     Trachea: No tracheal deviation.  Cardiovascular:     Rate and Rhythm: Normal rate and regular rhythm.  Pulmonary:     Effort: Pulmonary effort is normal.  Abdominal:     Tenderness: There is no abdominal tenderness. There is no guarding.  Musculoskeletal:        General: Normal range of motion.     Cervical back: Normal range of motion and neck supple.  Skin:    General: Skin is warm and dry.     Coloration: Skin is not pale.     Findings: No erythema or rash.  Neurological:     Mental Status: She is alert and oriented to person, place, and time.     Cranial Nerves: No cranial nerve deficit.     Coordination: Coordination normal.     Deep Tendon Reflexes: Reflexes are normal and symmetric.  Psychiatric:        Judgment: Judgment normal.     CMP ( most recent) CMP     Component  Value Date/Time   NA 139 01/04/2020 1113   NA 139 07/15/2019  0825   K 4.4 01/04/2020 1113   CL 106 01/04/2020 1113   CO2 24 01/04/2020 1113   GLUCOSE 147 (H) 01/04/2020 1113   BUN 34 (H) 01/04/2020 1113   BUN 27 (H) 07/15/2019 0825   CREATININE 1.10 (H) 01/04/2020 1113   CREATININE 1.12 (H) 06/05/2019 0710   CALCIUM 9.6 01/04/2020 1113   PROT 6.7 03/20/2019 1658   ALBUMIN 3.5 03/20/2019 1658   AST 17 03/20/2019 1658   ALT 23 03/20/2019 1658   ALKPHOS 104 03/20/2019 1658   BILITOT 0.7 03/20/2019 1658   GFRNONAA 58 (L) 01/04/2020 1113   GFRAA 54 (L) 10/02/2019 1228     Diabetic Labs (most recent): Lab Results  Component Value Date   HGBA1C 6.6 (A) 02/10/2020   HGBA1C 6.5 (A) 10/08/2019   HGBA1C 8.1 (H) 06/05/2019    Assessment & Plan:   1. Diabetes mellitus with stage 3 chronic kidney disease ([place])   - LI BOBO has currently uncontrolled symptomatic type 2 DM since  59 years of age.  She presents with controlled glycemic profile both fasting and postprandial.  Her point-of-care A1c 6.6%, remaining stable.  She did not document any hypoglycemia.     Recent labs reviewed. - I had a long discussion with her about the progressive nature of diabetes and the pathology behind its complications. -her diabetes is complicated by CHF, CKD stage 2-3 , smoking and she remains at a high risk for more acute and chronic complications which include CAD, CVA, CKD, retinopathy, and neuropathy. These are all discussed in detail with her.  - I have counseled her on diet  and weight management  by adopting a carbohydrate restricted/protein rich diet. Patient is encouraged to switch to  unprocessed or minimally processed     complex starch and increased protein intake (animal or plant source), fruits, and vegetables. -  she is advised to stick to a routine mealtimes to eat 3 meals  a day and avoid unnecessary snacks ( to snack only to correct hypoglycemia).    - she acknowledges that  there is a room for improvement in her food and drink choices. - Suggestion is made for her to avoid simple carbohydrates  from her diet including Cakes, Sweet Desserts, Ice Cream, Soda (diet and regular), Sweet Tea, Candies, Chips, Cookies, Store Bought Juices, Alcohol in Excess of  1-2 drinks a day, Artificial Sweeteners,  Coffee Creamer, and "Sugar-free" Products, Lemonade. This will help patient to have more stable blood glucose profile and potentially avoid unintended weight gain.   - she will be scheduled with Jearld Fenton, RDN, CDE for diabetes education.  - I have approached her with the following individualized plan to manage  her diabetes and patient agrees:   -In light of her presentation with controlled glycemic profile, she will not need insulin treatment for now.   -She is advised to continue glipizide 5 mg p.o. twice daily with breakfast and supper.    - She did not tolerate Metformin. -She was recently switched by her cardiologist to  [facility] , currently 10 mg daily.   --She agrees to continue monitoring blood glucose twice daily-daily before breakfast and at bedtime.  - she is encouraged to call clinic for blood glucose levels less than 70 or above 200 mg /dl. - she is not a suitable candidate for incretin therapy either due to her body habitus as well as chronic smoking history, posing risk for pancreatitis. -Addition of basal insulin would be the safest option  if she loses control on subsequent visits.   - Specific targets for  A1c;  LDL, HDL,  and Triglycerides were discussed with the patient.  2) Blood Pressure /Hypertension:  -Her blood pressure is controlled to target at 114/62.   she is advised to continue her current medications including Entresto 24/26 mg p.o. daily with breakfast .  She also has carvedilol 6.25 mg p.o. twice daily as well as spironolactone 25 mg p.o. daily.   3) Lipids/Hyperlipidemia:   She does not have recent lipid panel.   She is advised to  continue atorvastatin 40 mg p.o. nightly.   Side effects and precautions discussed with her.  4)  Weight/Diet:  Body mass index is 28.34 kg/m.  -She is a candidate for some weight loss. I discussed with her the fact that loss of 5 - 10% of her  current body weight will have the most impact on her diabetes management.  Exercise, and detailed carbohydrates information provided  -  detailed on discharge instructions.  The patient was counseled on the dangers of tobacco use, and was advised to quit.  Reviewed strategies to maximize success, including removing cigarettes and smoking materials from environment.   5) Chronic Care/Health Maintenance:  -she  is on ACEI/ARB and Statin medications and  is encouraged to initiate and continue to follow up with Ophthalmology, Dentist, orthopedic surgeon, cardiology, podiatrist at least yearly or according to recommendations, and advised to  quit smoking. I have recommended yearly flu vaccine and pneumonia vaccine at least every 5 years; moderate intensity exercise for up to 150 minutes weekly; and  sleep for at least 7 hours a day.  POCT ABI Results 02/10/20  Her ABIs normal today Right ABI: 1.04      left ABI: 1.18  Right leg systolic / diastolic: 355/73 mmHg Left leg systolic / diastolic: 220/25 mmHg  Arm systolic / diastolic: 427/06 mmHG This study will be repeated in 5 years, December 2026 or sooner if needed.  - she is  advised to maintain close follow up with [name], MD for primary care needs, as well as her other providers for optimal and coordinated care.   - Time spent on this patient care encounter:  35 min, of which > 50% was spent in  counseling and the rest reviewing her blood glucose logs , discussing her hypoglycemia and hyperglycemia episodes, reviewing her current and  previous labs / studies  ( including abstraction from other facilities) and medications  doses and developing a  long term treatment plan and documenting her  care.   Please refer to Patient Instructions for Blood Glucose Monitoring and Insulin/Medications Dosing Guide"  in media tab for additional information. Please  also refer to " Patient Self Inventory" in the Media  tab for reviewed elements of pertinent patient history.  Orbie Pyo participated in the discussions, expressed understanding, and voiced agreement with the above plans.  All questions were answered to her satisfaction. she is encouraged to contact clinic should she have any questions or concerns prior to her return visit.   Follow up plan: - Return in about 6 months (around 08/10/2020) for F/U with Pre-visit Labs, Meter, Logs, A1c here., ABI in Office NV, Urine MA - NV.  Glade Lloyd, MD [facility] [address] [place], [place] [address] Phone: [phone]  Fax: [phone]    02/10/2020, 11:53 AM  This note was partially dictated with voice recognition software. Similar sounding words can be  transcribed inadequately or may not  be corrected upon review.

## 2020-02-12 ENCOUNTER — Encounter: Payer: Self-pay | Admitting: Cardiology

## 2020-02-12 ENCOUNTER — Ambulatory Visit (INDEPENDENT_AMBULATORY_CARE_PROVIDER_SITE_OTHER): Payer: Medicaid Other | Admitting: Cardiology

## 2020-02-12 VITALS — BP 124/72 | HR 65 | Ht 66.0 in | Wt 177.0 lb

## 2020-02-12 DIAGNOSIS — I10 Essential (primary) hypertension: Secondary | ICD-10-CM

## 2020-02-12 DIAGNOSIS — I428 Other cardiomyopathies: Secondary | ICD-10-CM

## 2020-02-12 DIAGNOSIS — Z79899 Other long term (current) drug therapy: Secondary | ICD-10-CM

## 2020-02-12 NOTE — Progress Notes (Signed)
Cardiology Office Note  Date: 02/12/2020   ID: [name], [name] 01-15-1961, MRN [id number]  PCP:  [name], MD  Cardiologist:  Rozann Lesches, MD Electrophysiologist:  None   Chief Complaint  Patient presents with  . Cardiac follow-up    History of Present Illness: [name] is a 59 y.o. female last seen in June.  She has had interval follow-up in the advanced heart failure clinic, most recently in November.  She presents for a routine visit, continues to do well.  She reports NYHA class II dyspnea, no chest pain, orthopnea, or PND.  Recent echocardiogram in November revealed LVEF 45% with mild diastolic dysfunction, mild RV dysfunction.  We discussed these results today.  I reviewed her medications which have been stable from a cardiac perspective, outlined below.  She does not report any intolerances.  Weight has also been stable.  I personally reviewed her ECG which shows sinus rhythm with left anterior fascicular block, IVCD.  She does not report any palpitations or syncope.  Past Medical History:  Diagnosis Date  . Anal fissure   . Anxiety   . Chronic systolic (congestive) heart failure (HCC)    a. EF 20-25% by echo in 02/2019 with cath showing no significant CAD  . Depression   . Essential hypertension   . GERD (gastroesophageal reflux disease)   . History of seizures as a child    None since age 30  . Mixed hyperlipidemia   . Nonischemic cardiomyopathy ([place])   . Type 2 diabetes mellitus ([place])     Past Surgical History:  Procedure Laterality Date  . BACK SURGERY  2005   lumbar disckectomy  . COLONOSCOPY  10/01/2011   Procedure: COLONOSCOPY;  Surgeon: [name], MD;  [facility];  Service: Endoscopy;  Laterality: N/A;  730  . COLONOSCOPY N/A 10/04/2016   Procedure: COLONOSCOPY;  Surgeon: [name], MD;  [facility];  Service: Endoscopy;  Laterality: N/A;  10:30  . DILATION AND CURETTAGE OF UTERUS    . FOOT BONE  EXCISION     right  . POLYPECTOMY  10/04/2016   Procedure: POLYPECTOMY;  Surgeon: [name], MD;  [facility];  Service: Endoscopy;;  colon  . RIGHT/LEFT HEART CATH AND CORONARY ANGIOGRAPHY N/A 03/23/2019   Procedure: RIGHT/LEFT HEART CATH AND CORONARY ANGIOGRAPHY;  Surgeon: [name], MD;  Location: [place] [facility];  Service: Cardiovascular;  Laterality: N/A;    Current Outpatient Medications  Medication Sig Dispense Refill  . Accu-Chek FastClix Lancets MISC Apply topically 2 (two) times daily.    [name] ACCU-CHEK GUIDE test strip USE TO TEST TWICE DAILY.O    . acetaminophen (TYLENOL 8 HOUR) 650 MG CR tablet Take 1 tablet (650 mg total) by mouth every 8 (eight) hours as needed for pain or fever. 20 tablet 0  . ALPRAZolam (XANAX) 1 MG tablet Take 2 mg by mouth at bedtime.     [name] alprazolam (XANAX) 2 MG tablet Take 2 mg by mouth daily as needed.    [name] atorvastatin (LIPITOR) 40 MG tablet Take 40 mg by mouth daily.    . carvedilol (COREG) 6.25 MG tablet Take 1.5 tablets (9.375 mg total) by mouth 2 (two) times daily with a meal. 270 tablet 3  . Cholecalciferol (VITAMIN D3) 125 MCG (5000 UT) CAPS Take 1 capsule by mouth daily.    . dapagliflozin propanediol (FARXIGA) 10 MG TABS tablet Take 1 tablet (10  mg total) by mouth daily. 30 tablet 5  . ENTRESTO 24-26 MG TAKE ONE TABLET BY MOUTH TWO TIMES DAILY. 60 tablet 11  . FLUoxetine (PROZAC) 40 MG capsule Take 40 mg by mouth daily.    . fluticasone (FLONASE) 50 MCG/ACT nasal spray Place 2 sprays into both nostrils daily.    [name] glipiZIDE (GLUCOTROL) 5 MG tablet Take 5 mg by mouth 2 (two) times daily before a meal.     . loperamide (IMODIUM) 2 MG capsule TAKE 2 CAPSULES BY MOUTH INITIALLY, THEN TAKE 1 CAPSULE AFTER EACH LOOSE STOOL. DO NOT EXCEED 8 CAPSULES IN 24 HOURS.    [name] loratadine (CLARITIN) 10 MG tablet Take 10 mg by mouth daily.    [name] LORazepam (ATIVAN) 1 MG tablet Take 1 mg by mouth 2 (two) times daily.    . melatonin 3 MG TABS  tablet Take 3 mg by mouth at bedtime.    . nicotine (NICODERM CQ - DOSED IN MG/24 HOURS) 14 mg/24hr patch Place 14 mg onto the skin daily.    . pantoprazole (PROTONIX) 40 MG tablet Take 1 tablet (40 mg total) by mouth daily. 30 tablet 0  . spironolactone (ALDACTONE) 25 MG tablet Take 1 tablet (25 mg total) by mouth daily. 90 tablet 1   No current facility-administered medications for this visit.   Allergies:  Patient has no known allergies.   ROS: No syncope.  Physical Exam: VS:  BP 124/72   Pulse 65   Ht 5\' 6"  (1.676 m)   Wt 177 lb (80.3 kg)   LMP 09/25/2011   BMI 28.57 kg/m , BMI Body mass index is 28.57 kg/m.  Wt Readings from Last 3 Encounters:  02/12/20 177 lb (80.3 kg)  01/04/20 175 lb 9.6 oz (79.7 kg)  12/03/19 167 lb (75.8 kg)    General: Patient appears comfortable at rest. HEENT: Conjunctiva and lids normal, wearing a mask. Neck: Supple, no elevated JVP or carotid bruits, no thyromegaly. Lungs: Clear to auscultation, nonlabored breathing at rest. Cardiac: Regular rate and rhythm, no S3 or significant systolic murmur. Extremities: No pitting edema.  ECG:  An ECG dated 03/20/2019 was personally reviewed today and demonstrated:  Sinus rhythm with IVCD.  Recent Labwork: 03/20/2019: ALT 23; AST 17; B Natriuretic Peptide 464.0 03/24/2019: Hemoglobin 12.4; Platelets 260 01/04/2020: BUN 34; Creatinine, Ser 1.10; Potassium 4.4; Sodium 139   Other Studies Reviewed Today:  Echocardiogram 01/04/2020: 1. Left ventricular ejection fraction, by estimation, is 45%. The left  ventricle has mildly decreased function. The left ventricle demonstrates  global hypokinesis. Left ventricular diastolic parameters are consistent  with Grade I diastolic dysfunction  (impaired relaxation).  2. Right ventricular systolic function is mildly reduced. The right  ventricular size is normal. Tricuspid regurgitation signal is inadequate  for assessing PA pressure.  3. The mitral valve is  normal in structure. No evidence of mitral valve  regurgitation. No evidence of mitral stenosis.  4. The aortic valve is tricuspid. Aortic valve regurgitation is not  visualized. No aortic stenosis is present.  5. The inferior vena cava is normal in size with greater than 50%  respiratory variability, suggesting right atrial pressure of 3 mmHg.  Assessment and Plan:  1.  Nonischemic cardiomyopathy, LVEF most recently 45% with mildly reduced RV contraction.  She is clinically stable without evidence of fluid overload today, NYHA class II dyspnea.  Continue Coreg, Entresto, Aldactone, and [place].  Check BMET for next follow-up visit. She has interval follow-up scheduled in the heart  failure clinic.  2.  Tobacco abuse, still trying to quit with use of nicotine patch.  3.  Essential hypertension, blood pressure well controlled today.  Medication Adjustments/Labs and Tests Ordered: Current medicines are reviewed at length with the patient today.  Concerns regarding medicines are outlined above.   Tests Ordered: Orders Placed This Encounter  Procedures  . Basic metabolic panel  . EKG 12-Lead    Medication Changes: No orders of the defined types were placed in this encounter.   Disposition:  Follow up 6 months in the [place] office.  Signed, Satira Sark, MD, [facility] 02/12/2020 11:48 AM    [facility] [place], [place], [place] [address] Phone: [phone]; Fax: [phone]

## 2020-02-12 NOTE — Patient Instructions (Addendum)
Medication Instructions:    Your physician recommends that you continue on your current medications as directed. Please refer to the Current Medication list given to you today.  Labwork:  Your physician recommends that you return for lab work in: 6 months just before your next visit to check your BMET. This can be done at [facility] ([address])  Testing/Procedures:  None  Follow-Up:  Your physician recommends that you schedule a follow-up appointment in: 6 months.  Any Other Special Instructions Will Be Listed Below (If Applicable).  If you need a refill on your cardiac medications before your next appointment, please call your pharmacy.

## 2020-04-05 ENCOUNTER — Encounter (HOSPITAL_COMMUNITY): Payer: Self-pay | Admitting: Cardiology

## 2020-04-05 ENCOUNTER — Ambulatory Visit (HOSPITAL_COMMUNITY)
Admission: RE | Admit: 2020-04-05 | Discharge: 2020-04-05 | Disposition: A | Discharge status: Home / Self Care | Payer: Medicaid Other | Source: Ambulatory Visit | Attending: Cardiology | Admitting: Cardiology

## 2020-04-05 ENCOUNTER — Other Ambulatory Visit: Payer: Self-pay

## 2020-04-05 VITALS — BP 100/60 | HR 70 | Wt 176.0 lb

## 2020-04-05 DIAGNOSIS — I5022 Chronic systolic (congestive) heart failure: Secondary | ICD-10-CM | POA: Diagnosis not present

## 2020-04-05 DIAGNOSIS — Z7984 Long term (current) use of oral hypoglycemic drugs: Secondary | ICD-10-CM | POA: Diagnosis not present

## 2020-04-05 DIAGNOSIS — E785 Hyperlipidemia, unspecified: Secondary | ICD-10-CM | POA: Diagnosis not present

## 2020-04-05 DIAGNOSIS — E782 Mixed hyperlipidemia: Secondary | ICD-10-CM

## 2020-04-05 DIAGNOSIS — F17210 Nicotine dependence, cigarettes, uncomplicated: Secondary | ICD-10-CM | POA: Diagnosis not present

## 2020-04-05 DIAGNOSIS — I110 Hypertensive heart disease with heart failure: Secondary | ICD-10-CM | POA: Diagnosis present

## 2020-04-05 DIAGNOSIS — E119 Type 2 diabetes mellitus without complications: Secondary | ICD-10-CM | POA: Diagnosis not present

## 2020-04-05 DIAGNOSIS — Z8249 Family history of ischemic heart disease and other diseases of the circulatory system: Secondary | ICD-10-CM | POA: Diagnosis not present

## 2020-04-05 DIAGNOSIS — Z79899 Other long term (current) drug therapy: Secondary | ICD-10-CM | POA: Diagnosis not present

## 2020-04-05 LAB — BASIC METABOLIC PANEL
Anion gap: 11 (ref 5–15)
BUN: 36 mg/dL — ABNORMAL HIGH (ref 6–20)
CO2: 21 mmol/L — ABNORMAL LOW (ref 22–32)
Calcium: 9.4 mg/dL (ref 8.9–10.3)
Chloride: 104 mmol/L (ref 98–111)
Creatinine, Ser: 1.2 mg/dL — ABNORMAL HIGH (ref 0.44–1.00)
GFR, Estimated: 52 mL/min — ABNORMAL LOW (ref >60)
Glucose, Bld: 208 mg/dL — ABNORMAL HIGH (ref 70–99)
Potassium: 4.4 mmol/L (ref 3.5–5.1)
Sodium: 136 mmol/L (ref 135–145)

## 2020-04-05 LAB — LIPID PANEL
Cholesterol: 114 mg/dL (ref 0–200)
HDL: 35 mg/dL — ABNORMAL LOW (ref >40)
LDL Cholesterol: 38 mg/dL (ref 0–99)
Total CHOL/HDL Ratio: 3.3 RATIO
Triglycerides: 203 mg/dL — ABNORMAL HIGH (ref <150)
VLDL: 41 mg/dL — ABNORMAL HIGH (ref 0–40)

## 2020-04-05 NOTE — Progress Notes (Signed)
Advanced [facility] Failure Clinic Note   Referring Physician: PCP: [name], MD PCP-Cardiologist: Rozann Lesches, MD  HF Cardiology: Dr. Aundra Dubin   HPI:  60 y.o. female w/ history of DM, HTN, hyperlipidemia, and smoking.  She was admitted on 1/22 for about 1 week of shortness of breath. She had been started on azithromycin and prednisone by PCP and was taking inhalers for ?COPD exacerbation without relief.  COVID negative.  Dyspnea primarily exertional, no orthopnea. No chest pain reported.  In ER at [facility], pro-BNP was elevated and CXR suggestive of volume overload. Slightly elevated HS-TnI with no trend, not suggestive of ACS.  She was admitted for management of new diagnosis of CHF and seen by Dr. Domenic Polite.  Echo was done, showed EF 20-25% with mild RV dysfunction. She was started on IV Lasix for diuresed then transitioned to PO.  [facility] for RHC/LHC to assess filling pressures/CO and also to assess for coronary disease as etiology.   Of note, her father developed CHF in his 93s, nonischemic cardiomyopathy.  He had ICD and later heart transplant.  Grandmother also had CHF.   Cath was done 1/25 and showed no significant coronary disease and optimized filling pressures with preserved cardiac output (see angiographic and hemodynamic data below). Diagnosed w/ NICM. cMRI showed no myocardial LGE, so no definitive evidence for prior MI, infiltrative disease, or cardiomyopathy. RV normal w/ EF 35%. She was started on GDMT w/ ARB, ? blocker and MRA + loop diuretic.   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 Broadus John, thought to be probably be a benign variant.   Echo 5/21 with EF 30-35% (closer to 35%), diffuse hypokinesis, mildly decreased RV systolic function, normal IVC. Echo in 11/21 showed EF 45%, diffuse hypokinesis, mildly decreased RV systolic function.   She presents for followup of CHF.  She is still smoking,  Wellbutrin and nicotine patches failed.  SBP 90s-100s at home but she denies lightheadedness.  No significant exertional dyspnea.  No chest pain.  No orthopnea/PND.  Overall seems to be doing well.   ECG (personally reviewed): NSR, left axis deviation, poor RWP  Labs (8/21): K 4.5, creatinine 1.33 Labs (11/21): K 4.4, creatinine 1.1  PMH: 1. Hyperlipidemia 2. Type 2 diabetes.  3. Chronic systolic CHF:  - Echo (0/73).  EF 20-25%, mildly decreased RV function.  - RHC/LHC (1/21): Nonobstructive CAD; mean RA 9, PA 28/5, mean PCWP 9, CI 2.37 - Cardiac MRI (1/21): LV EF 31%, RVEF 28%, No myocardial LGE.  - Echo (5/21): EF 30-35% (closer to 35%), diffuse hypokinesis, mildly decreased RV systolic function, normal IVC. 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  - Echo (11/21): EF 45%, diffuse hypokinesis, mildly decreased RV systolic function.   Review of Systems: All systems reviewed and negative except as per HPI.   Current Outpatient Medications  Medication Sig Dispense Refill  . Accu-Chek FastClix Lancets MISC Apply topically 2 (two) times daily.    [name] ACCU-CHEK GUIDE test strip USE TO TEST TWICE DAILY.O    . acetaminophen (TYLENOL 8 HOUR) 650 MG CR tablet Take 1 tablet (650 mg total) by mouth every 8 (eight) hours as needed for pain or fever. 20 tablet 0  . alprazolam (XANAX) 2 MG tablet Take 2 mg by mouth daily as needed.    [name] atorvastatin (LIPITOR) 40 MG tablet Take 40 mg by mouth  daily.    . carvedilol (COREG) 6.25 MG tablet Take 1.5 tablets (9.375 mg total) by mouth 2 (two) times daily with a meal. 270 tablet 3  . Cholecalciferol (VITAMIN D3) 125 MCG (5000 UT) CAPS Take 1 capsule by mouth daily.    . dapagliflozin propanediol (FARXIGA) 10 MG TABS tablet Take 1 tablet (10 mg total) by mouth daily. 30 tablet 5  . ENTRESTO 24-26 MG TAKE ONE TABLET BY MOUTH TWO TIMES DAILY. 60 tablet 11  . FLUoxetine (PROZAC) 40 MG  capsule Take 40 mg by mouth daily.    . fluticasone (FLONASE) 50 MCG/ACT nasal spray Place 2 sprays into both nostrils daily.    [name] glipiZIDE (GLUCOTROL) 5 MG tablet Take 5 mg by mouth 2 (two) times daily before a meal.     . loperamide (IMODIUM) 2 MG capsule TAKE 2 CAPSULES BY MOUTH INITIALLY, THEN TAKE 1 CAPSULE AFTER EACH LOOSE STOOL. DO NOT EXCEED 8 CAPSULES IN 24 HOURS.    [name] loratadine (CLARITIN) 10 MG tablet Take 10 mg by mouth daily.    [name] LORazepam (ATIVAN) 1 MG tablet Take 1 mg by mouth 2 (two) times daily.    . melatonin 3 MG TABS tablet Take 3 mg by mouth at bedtime.    . nicotine (NICODERM CQ - DOSED IN MG/24 HOURS) 14 mg/24hr patch Place 14 mg onto the skin daily.    . pantoprazole (PROTONIX) 40 MG tablet Take 1 tablet (40 mg total) by mouth daily. 30 tablet 0  . spironolactone (ALDACTONE) 25 MG tablet Take 1 tablet (25 mg total) by mouth daily. 90 tablet 1   No current facility-administered medications for this encounter.    No Known Allergies    Social History   Socioeconomic History  . Marital status: Divorced    Spouse name: Not on file  . Number of children: Not on file  . Years of education: Not on file  . Highest education level: Not on file  Occupational History  . Not on file  Tobacco Use  . Smoking status: Current Every Day Smoker    Packs/day: 0.25    Years: 37.00    Pack years: 9.25    Types: Cigarettes  . Smokeless tobacco: Never Used  . Tobacco comment: 1-2 cigarettes daily  Vaping Use  . Vaping Use: Never used  Substance and Sexual Activity  . Alcohol use: No  . Drug use: No  . Sexual activity: Yes    Birth control/protection: None  Other Topics Concern  . Not on file  Social History Narrative  . Not on file   Social Determinants of Health   Financial Resource Strain: Not on file  Food Insecurity: Not on file  Transportation Needs: Not on file  Physical Activity: Not on file  Stress: Not on file  Social Connections: Not on file   Intimate Partner Violence: Not on file      Family History  Problem Relation Age of Onset  . Arthritis Mother   . Hernia Mother   . Constipation Mother   . Heart failure Father        Underwent heart transplant  . Colon cancer Neg Hx     Vitals:   04/05/20 1015  BP: 100/60  Pulse: 70  SpO2: 95%  Weight: 79.8 kg (176 lb)   PHYSICAL EXAM: General: NAD Neck: No JVD, no thyromegaly or thyroid nodule.  Lungs: Clear to auscultation bilaterally with normal respiratory effort. CV: Nondisplaced PMI.  Heart regular S1/S2,  no S3/S4, no murmur.  No peripheral edema.  No carotid bruit.  Normal pedal pulses.  Abdomen: Soft, nontender, no hepatosplenomegaly, no distention.  Skin: Intact without lesions or rashes.  Neurologic: Alert and oriented x 3.  Psych: Normal affect. Extremities: No clubbing or cyanosis.  HEENT: Normal.   ASSESSMENT & PLAN:  1. Chronic Systolic CHF: Echo 9/35 w/ EF 20-25%, mild RV dysfunction. No prior cardiac history but father has history of nonischemic cardiomyopathy withheart transplant and paternal grandmother also had CHF.  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 Broadus John, probably benign variant.LHC/RHC 1/21 showed no significant coronary disease and optimized filling pressures with preserved cardiac output. cMRI showed no myocardial LGE, so no definitive evidence for prior MI, infiltrative disease, or cardiomyopathy.  Echo 5/21 showed mild improvement in LV function, EF 30-35% (probably closer to 35%).  Echo in 11/21 showed EF up to 45%. She is not volume overloaded, NYHA class 2 symptoms.  BP soft but no lightheadedness, do not think she has BP room to increase meds.  - Continue Entresto 24-26 bid.  - Continue spironolactone 25 mg daily. BMET today.  - Continue Coreg 9.375 mg bid.  - Continue dapagliflozin 10 mg daily.  - She is now out of ICD range. 2. Smoking: Still smoking a few cigs/day.  She has failed  nicotine patches and wellbutrin.  Unable to get Chantix at this time (has worked for her in the past).  3. HTN: BP now low.  4. Type 2 diabetes: Sees an endocrinologist.  5. Hyperlipidemia: Continue statin, check lipids today.   Followup in 4 months.   Loralie Champagne, MD 04/05/20

## 2020-04-05 NOTE — Patient Instructions (Signed)
Labs done today. We will contact you only if your labs are abnormal.  No medication changes were made. Please continue all current medications as prescribed.  Your physician recommends that you schedule a follow-up appointment in: 4 months.   If you have any questions or concerns before your next appointment please send us a message through mychart or call our office at [phone].    TO LEAVE A MESSAGE FOR THE NURSE SELECT OPTION 2, PLEASE LEAVE A MESSAGE INCLUDING: . YOUR NAME . DATE OF BIRTH . CALL BACK NUMBER . REASON FOR CALL**this is important as we prioritize the call backs  YOU WILL RECEIVE A CALL BACK THE SAME DAY AS LONG AS YOU CALL BEFORE 4:00 PM   Do the following things EVERYDAY: 1) Weigh yourself in the morning before breakfast. Write it down and keep it in a log. 2) Take your medicines as prescribed 3) Eat low salt foods--Limit salt (sodium) to 2000 mg per day.  4) Stay as active as you can everyday 5) Limit all fluids for the day to less than 2 liters   At the [facility], you and your health needs are our priority. As part of our continuing mission to provide you with exceptional heart care, we have created designated Provider Care Teams. These Care Teams include your primary Cardiologist (physician) and Advanced Practice Providers (APPs- Physician Assistants and Nurse Practitioners) who all work together to provide you with the care you need, when you need it.   You may see any of the following providers on your designated Care Team at your next follow up: . Dr Daniel Bensimhon . Dr Dalton McLean . Amy Clegg, NP . Brittainy Simmons, PA . Lauren Kemp, [name]   Please be sure to bring in all your medications bottles to every appointment.   

## 2020-04-07 ENCOUNTER — Telehealth (HOSPITAL_COMMUNITY): Payer: Self-pay

## 2020-04-07 DIAGNOSIS — E782 Mixed hyperlipidemia: Secondary | ICD-10-CM

## 2020-04-07 MED ORDER — ICOSAPENT ETHYL 1 G PO CAPS
2.0000 g | ORAL_CAPSULE | Freq: Two times a day (BID) | ORAL | 11 refills | Status: DC
Start: 2020-04-07 — End: 2020-04-12

## 2020-04-07 NOTE — Telephone Encounter (Signed)
-----   Message from [name], MD sent at 04/05/2020  5:00 PM EST ----- Triglycerides high, would start vascepa 2 g bid.  Lipids 2 months.

## 2020-04-07 NOTE — Telephone Encounter (Signed)
Patient advised and verbalized understanding. New rx sent into patients pharmacy, patient would like labs drawn at [place] order entered and released to be [facility] at [facility]  Orders Placed This Encounter  Procedures  . Lipid Profile    Standing Status:   Future    Standing Expiration Date:   04/07/2021    Order Specific Question:   Release to patient    Answer:   Immediate    Meds ordered this encounter  Medications  . icosapent Ethyl (VASCEPA) 1 g capsule    Sig: Take 2 capsules (2 g total) by mouth 2 (two) times daily.    Dispense:  120 capsule    Refill:  11

## 2020-04-11 ENCOUNTER — Telehealth (HOSPITAL_COMMUNITY): Payer: Self-pay | Admitting: Pharmacy Technician

## 2020-04-11 NOTE — Telephone Encounter (Signed)
Received a PA request for Vascepa for this patient. Upon further investigation, she would need to try and fail Fenofibrate before we could attempt a PA for Vascepa.  Sent Philicia (CMA) a message about the medication change. Requested that the patient be called and a new RX to be sent to the pharmacy.  Charlann Boxer, CPhT

## 2020-04-12 ENCOUNTER — Telehealth (HOSPITAL_COMMUNITY): Payer: Self-pay

## 2020-04-12 MED ORDER — FENOFIBRATE 48 MG PO TABS
48.0000 mg | ORAL_TABLET | Freq: Every day | ORAL | 3 refills | Status: DC
Start: 2020-04-12 — End: 2021-02-01

## 2020-04-12 NOTE — Telephone Encounter (Signed)
Per [employer] will not cover patients [name] until she tries and fails fenofibrate. Discussed this with Dr. Loralie Champagne and he said have patient start low dose of fenofibrate. Patient made aware and new Rx sent into patients pharmacy.

## 2020-04-14 ENCOUNTER — Telehealth (HOSPITAL_COMMUNITY): Payer: Self-pay | Admitting: Pharmacy Technician

## 2020-04-14 NOTE — Telephone Encounter (Signed)
I have received several PA requests for Vascepa for this patient. She was recently transitioned from Vascepa to Fenofibrate, per insurance preference.  Called and asked the pharmacy to D/C the medication.  Charlann Boxer, CPhT

## 2020-04-25 ENCOUNTER — Telehealth (HOSPITAL_COMMUNITY): Payer: Self-pay | Admitting: Pharmacy Technician

## 2020-04-25 NOTE — Telephone Encounter (Signed)
Patient Advocate Encounter   Received notification from [facility] that prior authorization for Delene Loll is required.   PA submitted on CoverMyMeds Key [id number] Status is pending   Will continue to follow.

## 2020-04-26 ENCOUNTER — Other Ambulatory Visit (HOSPITAL_COMMUNITY): Payer: Self-pay | Admitting: *Deleted

## 2020-04-26 MED ORDER — ENTRESTO 24-26 MG PO TABS: 1.0000 | ORAL_TABLET | Freq: Two times a day (BID) | ORAL | 11 refills | Status: DC

## 2020-04-26 NOTE — Telephone Encounter (Signed)
Advanced Heart Failure Patient Advocate Encounter  Prior Authorization for Delene Loll has been approved.    PA#  [id number] Effective dates: 04/26/20 through 04/26/21  Patients co-pay is $3  Charlann Boxer, CPhT

## 2020-04-29 ENCOUNTER — Telehealth (HOSPITAL_COMMUNITY): Payer: Self-pay | Admitting: *Deleted

## 2020-04-29 NOTE — Telephone Encounter (Signed)
Take spironolactone at bedtime.  Otherwise, no changes.

## 2020-04-29 NOTE — Telephone Encounter (Signed)
Pt aware and verbalized understanding.  

## 2020-04-29 NOTE — Telephone Encounter (Signed)
Pt said her systolic bp has been running 80s-90s at home. Pt said she feels fine denies dizziness,light headedness, or fatigue.

## 2020-04-29 NOTE — Telephone Encounter (Signed)
Allison,NP w/Dr.Knowlton's office left vm stating pt has been experiencing lower than normal bp readings. She requests a call back to down titrate medications.  Call back # [phone]  Routed to [place]

## 2020-04-29 NOTE — Telephone Encounter (Signed)
Check with patient.  As long as not lightheaded and SBP > 90, would not change anything.

## 2020-05-23 ENCOUNTER — Telehealth (HOSPITAL_COMMUNITY): Payer: Self-pay | Admitting: *Deleted

## 2020-05-23 NOTE — Telephone Encounter (Signed)
Order for labs mailed to patients home.

## 2020-05-30 LAB — LIPID PANEL
LDL Cholesterol: 112
Triglycerides: 218 — AB (ref 40–160)

## 2020-06-08 ENCOUNTER — Other Ambulatory Visit (HOSPITAL_COMMUNITY): Payer: Self-pay | Admitting: Cardiology

## 2020-06-23 ENCOUNTER — Other Ambulatory Visit (HOSPITAL_COMMUNITY): Payer: Self-pay | Admitting: Family Medicine

## 2020-06-23 DIAGNOSIS — Z1231 Encounter for screening mammogram for malignant neoplasm of breast: Secondary | ICD-10-CM

## 2020-07-05 ENCOUNTER — Other Ambulatory Visit (HOSPITAL_COMMUNITY): Payer: Self-pay | Admitting: Cardiology

## 2020-07-12 ENCOUNTER — Other Ambulatory Visit (HOSPITAL_COMMUNITY): Payer: Self-pay | Admitting: Cardiology

## 2020-08-03 ENCOUNTER — Other Ambulatory Visit: Payer: Self-pay

## 2020-08-03 ENCOUNTER — Ambulatory Visit (HOSPITAL_COMMUNITY)
Admission: RE | Admit: 2020-08-03 | Discharge: 2020-08-03 | Disposition: A | Discharge status: Home / Self Care | Payer: Medicaid Other | Source: Ambulatory Visit | Attending: Cardiology | Admitting: Cardiology

## 2020-08-03 ENCOUNTER — Encounter (HOSPITAL_COMMUNITY): Payer: Self-pay | Admitting: Cardiology

## 2020-08-03 VITALS — BP 100/58 | HR 72 | Wt 171.0 lb

## 2020-08-03 DIAGNOSIS — I2510 Atherosclerotic heart disease of native coronary artery without angina pectoris: Secondary | ICD-10-CM | POA: Diagnosis not present

## 2020-08-03 DIAGNOSIS — Z79899 Other long term (current) drug therapy: Secondary | ICD-10-CM | POA: Diagnosis not present

## 2020-08-03 DIAGNOSIS — Z8249 Family history of ischemic heart disease and other diseases of the circulatory system: Secondary | ICD-10-CM | POA: Insufficient documentation

## 2020-08-03 DIAGNOSIS — E782 Mixed hyperlipidemia: Secondary | ICD-10-CM | POA: Diagnosis not present

## 2020-08-03 DIAGNOSIS — I428 Other cardiomyopathies: Secondary | ICD-10-CM | POA: Insufficient documentation

## 2020-08-03 DIAGNOSIS — I5022 Chronic systolic (congestive) heart failure: Secondary | ICD-10-CM | POA: Insufficient documentation

## 2020-08-03 DIAGNOSIS — E785 Hyperlipidemia, unspecified: Secondary | ICD-10-CM | POA: Insufficient documentation

## 2020-08-03 DIAGNOSIS — I110 Hypertensive heart disease with heart failure: Secondary | ICD-10-CM | POA: Insufficient documentation

## 2020-08-03 DIAGNOSIS — F17210 Nicotine dependence, cigarettes, uncomplicated: Secondary | ICD-10-CM | POA: Insufficient documentation

## 2020-08-03 DIAGNOSIS — E119 Type 2 diabetes mellitus without complications: Secondary | ICD-10-CM | POA: Insufficient documentation

## 2020-08-03 LAB — BASIC METABOLIC PANEL
Anion gap: 9 (ref 5–15)
BUN: 37 mg/dL — ABNORMAL HIGH (ref 6–20)
CO2: 22 mmol/L (ref 22–32)
Calcium: 9.2 mg/dL (ref 8.9–10.3)
Chloride: 105 mmol/L (ref 98–111)
Creatinine, Ser: 1.29 mg/dL — ABNORMAL HIGH (ref 0.44–1.00)
GFR, Estimated: 48 mL/min — ABNORMAL LOW (ref >60)
Glucose, Bld: 144 mg/dL — ABNORMAL HIGH (ref 70–99)
Potassium: 4.2 mmol/L (ref 3.5–5.1)
Sodium: 136 mmol/L (ref 135–145)

## 2020-08-03 LAB — LIPID PANEL
Cholesterol: 210 mg/dL — ABNORMAL HIGH (ref 0–200)
HDL: 36 mg/dL — ABNORMAL LOW (ref >40)
LDL Cholesterol: 126 mg/dL — ABNORMAL HIGH (ref 0–99)
Total CHOL/HDL Ratio: 5.8 RATIO
Triglycerides: 240 mg/dL — ABNORMAL HIGH (ref <150)
VLDL: 48 mg/dL — ABNORMAL HIGH (ref 0–40)

## 2020-08-03 NOTE — Patient Instructions (Signed)
EKG done today.   Labs done today. We will contact you only if your labs are abnormal.  No medication changes were made. Please continue all current medications as prescribed.  Your physician recommends that you schedule a follow-up appointment in: 6 months. Please contact our office in November for a December appointment.    If you have any questions or concerns before your next appointment please send [place] a message through [place] or call our office at [phone].    TO LEAVE A MESSAGE FOR THE NURSE SELECT OPTION 2, PLEASE LEAVE A MESSAGE INCLUDING: . YOUR NAME . DATE OF BIRTH . CALL BACK NUMBER . REASON FOR CALL**this is important as we prioritize the call backs  YOU WILL RECEIVE A CALL BACK THE SAME DAY AS LONG AS YOU CALL BEFORE 4:00 PM   Do the following things EVERYDAY: 1) Weigh yourself in the morning before breakfast. Write it down and keep it in a log. 2) Take your medicines as prescribed 3) Eat low salt foods--Limit salt (sodium) to 2000 mg per day.  4) Stay as active as you can everyday 5) Limit all fluids for the day to less than 2 liters   At the [facility], you and your health needs are our priority. As part of our continuing mission to provide you with exceptional heart care, we have created designated Provider Care Teams. These Care Teams include your primary Cardiologist (physician) and Advanced Practice Providers (APPs- Physician Assistants and Nurse Practitioners) who all work together to provide you with the care you need, when you need it.   You may see any of the following providers on your designated Care Team at your next follow up: [name] Dr Glori Bickers . Dr Loralie Champagne . Darrick Grinder, NP . Lyda Jester, PA . Audry Riles, [name]   Please be sure to bring in all your medications bottles to every appointment.

## 2020-08-04 ENCOUNTER — Ambulatory Visit (INDEPENDENT_AMBULATORY_CARE_PROVIDER_SITE_OTHER): Payer: Medicaid Other | Admitting: Nurse Practitioner

## 2020-08-04 ENCOUNTER — Encounter: Payer: Self-pay | Admitting: Nurse Practitioner

## 2020-08-04 VITALS — BP 109/75 | HR 85 | Ht 66.0 in | Wt 172.6 lb

## 2020-08-04 DIAGNOSIS — E1122 Type 2 diabetes mellitus with diabetic chronic kidney disease: Secondary | ICD-10-CM | POA: Diagnosis not present

## 2020-08-04 DIAGNOSIS — N1831 Chronic kidney disease, stage 3a: Secondary | ICD-10-CM

## 2020-08-04 DIAGNOSIS — I10 Essential (primary) hypertension: Secondary | ICD-10-CM | POA: Diagnosis not present

## 2020-08-04 DIAGNOSIS — E782 Mixed hyperlipidemia: Secondary | ICD-10-CM | POA: Diagnosis not present

## 2020-08-04 LAB — POCT GLYCOSYLATED HEMOGLOBIN (HGB A1C): HbA1c, POC (controlled diabetic range): 6.3 % (ref 0.0–7.0)

## 2020-08-04 LAB — POCT UA - MICROALBUMIN
Albumin/Creatinine Ratio, Urine, POC: <30
Creatinine, POC: 200 mg/dL
Microalbumin Ur, POC: 10 mg/L

## 2020-08-04 MED ORDER — ACCU-CHEK GUIDE VI STRP: ORAL_STRIP | 3 refills | Status: DC

## 2020-08-04 MED ORDER — ACCU-CHEK FASTCLIX LANCETS MISC: 2 refills | Status: DC

## 2020-08-04 NOTE — Progress Notes (Signed)
08/04/2020, 11:09 AM  Endocrinology follow-up note   Subjective:    Patient ID: [name], female    DOB: Apr 10, 1960.  [name] is being seen in follow-up after she was seen in consultation for management of currently uncontrolled symptomatic diabetes requested by  [name], MD.   Past Medical History:  Diagnosis Date   Anal fissure    Anxiety    Chronic systolic (congestive) heart failure ([place])    a. 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89   Mixed hyperlipidemia    Nonischemic cardiomyopathy ([place])    Type 2 diabetes mellitus ([place])     Past Surgical History:  Procedure Laterality Date   BACK SURGERY  2005   lumbar disckectomy   COLONOSCOPY  10/01/2011   Procedure: COLONOSCOPY;  Surgeon: [name], MD;  Location: [facility];  Service: Endoscopy;  Laterality: N/A;  730   COLONOSCOPY N/A 10/04/2016   Procedure: COLONOSCOPY;  Surgeon: [name], MD;  Location: [facility];  Service: Endoscopy;  Laterality: N/A;  10:30   DILATION AND CURETTAGE OF UTERUS     FOOT BONE EXCISION     right   POLYPECTOMY  10/04/2016   Procedure: POLYPECTOMY;  Surgeon: [name], MD;  Location: [facility];  Service: Endoscopy;;  colon   RIGHT/LEFT HEART CATH AND CORONARY ANGIOGRAPHY N/A 03/23/2019   Procedure: RIGHT/LEFT HEART CATH AND CORONARY ANGIOGRAPHY;  Surgeon: [name], MD;  Location: [facility];  Service: Cardiovascular;  Laterality: N/A;    Social History   Socioeconomic History   Marital status: Divorced    Spouse name: Not on file   Number of children: Not on file   Years of education: Not on file   Highest education level: Not on file  Occupational History   Not on file  Tobacco Use   Smoking status:  Every Day    Packs/day: 0.25    Years: 37.00    Pack years: 9.25    Types: Cigarettes   Smokeless tobacco: Never   Tobacco comments:    1-2 cigarettes daily  Vaping Use   Vaping Use: Never used  Substance and Sexual Activity   Alcohol use: No   Drug use: No   Sexual activity: Yes    Birth control/protection: None  Other Topics Concern   Not on file  Social History Narrative   Not on file   Social Determinants of Health   Financial Resource Strain: Not on file  Food Insecurity: Not on file  Transportation Needs: Not on file  Physical Activity: Not on file  Stress: Not on file  Social Connections: Not on file    Family History  Problem Relation Age of Onset   Arthritis Mother    Hernia Mother    Constipation Mother    Heart failure Father        Underwent heart transplant   Colon cancer Neg Hx     Outpatient Encounter Medications  as of 08/04/2020  Medication Sig   Accu-Chek FastClix Lancets MISC Use to check glucose twice daily with meals   ACCU-CHEK GUIDE test strip Use as instructed to monitor glucose twice daily   acetaminophen (TYLENOL 8 HOUR) 650 MG CR tablet Take 1 tablet (650 mg total) by mouth every 8 (eight) hours as needed for pain or fever.   alprazolam (XANAX) 2 MG tablet Take 2 mg by mouth daily as needed.   atorvastatin (LIPITOR) 40 MG tablet Take 40 mg by mouth daily.   carvedilol (COREG) 6.25 MG tablet Take 1.5 tablets (9.375 mg total) by mouth 2 (two) times daily with a meal.   Cetirizine HCl 10 MG CAPS Take 10 mg by mouth daily as needed.   Cholecalciferol (VITAMIN D3) 125 MCG (5000 UT) CAPS Take 1 capsule by mouth daily.   FARXIGA 10 MG TABS tablet TAKE ONE TABLET (10MG  TOTAL) BY MOUTH DAILY   fenofibrate (TRICOR) 48 MG tablet Take 1 tablet (48 mg total) by mouth daily.   FLUoxetine (PROZAC) 40 MG capsule Take 40 mg by mouth daily.   fluticasone (FLONASE) 50 MCG/ACT nasal spray Place 2 sprays into both nostrils daily.   glipiZIDE (GLUCOTROL) 5 MG  tablet Take 5 mg by mouth daily.   loperamide (IMODIUM) 2 MG capsule TAKE 2 CAPSULES BY MOUTH INITIALLY, THEN TAKE 1 CAPSULE AFTER EACH LOOSE STOOL. DO NOT EXCEED 8 CAPSULES IN 24 HOURS.   LORazepam (ATIVAN) 1 MG tablet Take 1 mg by mouth 2 (two) times daily.   melatonin 3 MG TABS tablet Take 3 mg by mouth at bedtime.   nicotine (NICODERM CQ - DOSED IN MG/24 HOURS) 14 mg/24hr patch Place 14 mg onto the skin daily.   pantoprazole (PROTONIX) 40 MG tablet Take 1 tablet (40 mg total) by mouth daily.   sacubitril-valsartan (ENTRESTO) 24-26 MG Take 1 tablet by mouth 2 (two) times daily.   spironolactone (ALDACTONE) 25 MG tablet TAKE ONE TABLET (25MG  TOTAL) BY MOUTH DAILY   zolpidem (AMBIEN) 10 MG tablet Take 10 mg by mouth at bedtime as needed for sleep.   [DISCONTINUED] Accu-Chek FastClix Lancets MISC Apply topically 2 (two) times daily.   [DISCONTINUED] ACCU-CHEK GUIDE test strip USE TO TEST TWICE DAILY.O   No facility-administered encounter medications on file as of 08/04/2020.    ALLERGIES: No Known Allergies  VACCINATION STATUS:  There is no immunization history on file for this patient.  Diabetes She presents for her follow-up diabetic visit. She has type 2 diabetes mellitus. Onset time: She was diagnosed at approximate age of 24 years. Her disease course has been stable. There are no hypoglycemic associated symptoms. Pertinent negatives for hypoglycemia include no confusion, headaches, pallor or seizures. Associated symptoms include fatigue. Pertinent negatives for diabetes include no chest pain, no polydipsia, no polyphagia and no polyuria. There are no hypoglycemic complications. Symptoms are stable. Diabetic complications include heart disease and nephropathy. Risk factors for coronary artery disease include dyslipidemia, diabetes mellitus, obesity, sedentary lifestyle, post-menopausal and tobacco exposure. Current diabetic treatment includes oral agent (dual therapy). She is compliant with  treatment most of the time. Her weight is fluctuating minimally. She is following a generally unhealthy diet. When asked about meal planning, she reported none. She has not had a previous visit with a dietitian. She rarely participates in exercise. Her home blood glucose trend is increasing steadily. Her breakfast blood glucose range is generally 140-180 mg/dl. Her bedtime blood glucose range is generally 110-130 mg/dl. (She presents today with her  meter and logs showing slightly above target fasting and tight postprandial glycemic profile.  Her POCT A1c today is 6.3%, improving from previous visit of 6.6%.  She denies any s/s of hypoglycemia.  ) An ACE inhibitor/angiotensin II receptor blocker is being taken. Eye exam is current.  Hyperlipidemia This is a chronic problem. The current episode started more than 1 year ago. The problem is uncontrolled. Recent lipid tests were reviewed and are high. Exacerbating diseases include chronic renal disease, diabetes and obesity. Factors aggravating her hyperlipidemia include fatty foods, beta blockers and smoking. Pertinent negatives include no chest pain, myalgias or shortness of breath. Current antihyperlipidemic treatment includes statins and fibric acid derivatives. The current treatment provides mild improvement of lipids. Compliance problems include adherence to diet, adherence to exercise and psychosocial issues.  Risk factors for coronary artery disease include dyslipidemia, diabetes mellitus, obesity, a sedentary lifestyle and post-menopausal.  Hypertension This is a chronic problem. The current episode started more than 1 year ago. The problem has been resolved since onset. The problem is controlled. Pertinent negatives include no chest pain, headaches, palpitations or shortness of breath. There are no associated agents to hypertension. Risk factors for coronary artery disease include diabetes mellitus, dyslipidemia, obesity, smoking/tobacco exposure,  sedentary lifestyle and post-menopausal state. Past treatments include ACE inhibitors, beta blockers, angiotensin blockers and diuretics. The current treatment provides significant improvement. Compliance problems include diet and exercise.  Hypertensive end-organ damage includes kidney disease and heart failure. Identifiable causes of hypertension include chronic renal disease.    Review of systems  Constitutional: + Minimally fluctuating body weight,  current Body mass index is 27.86 kg/m. , no fatigue, no subjective hyperthermia, no subjective hypothermia Eyes: no blurry vision, no xerophthalmia ENT: no sore throat, no nodules palpated in throat, no dysphagia/odynophagia, no hoarseness Cardiovascular: no chest pain, no shortness of breath, no palpitations, no leg swelling Respiratory: no cough, no shortness of breath Gastrointestinal: no nausea/vomiting/diarrhea Musculoskeletal: no muscle/joint aches Skin: no rashes, no hyperemia Neurological: no tremors, no numbness, no tingling, no dizziness Psychiatric: no depression, no anxiety   Objective:    BP 109/75   Pulse 85   Ht 5\' 6"  (1.676 m)   Wt 172 lb 9.6 oz (78.3 kg)   LMP 09/25/2011   BMI 27.86 kg/m   Wt Readings from Last 3 Encounters:  08/04/20 172 lb 9.6 oz (78.3 kg)  08/03/20 171 lb (77.6 kg)  04/05/20 176 lb (79.8 kg)    BP Readings from Last 3 Encounters:  08/04/20 109/75  08/03/20 (!) 100/58  04/05/20 100/60     Physical Exam- Limited  Constitutional:  Body mass index is 27.86 kg/m. , not in acute distress, normal state of mind Eyes:  EOMI, no exophthalmos Neck: Supple Cardiovascular: RRR, no murmurs, rubs, or gallops, no edema Respiratory: Adequate breathing efforts, no crackles, rales, rhonchi, or wheezing Musculoskeletal: no gross deformities, strength intact in all four extremities, no gross restriction of joint movements Skin:  no rashes, no hyperemia Neurological: no tremor with outstretched  hands   Foot exam:  No rashes, ulcers, cuts, calluses, onychodystrophy.  Good pulses bilat. Good sensation to 10 g monofilament bilat.    CMP ( most recent) CMP     Component Value Date/Time   NA 136 08/03/2020 1104   NA 139 07/15/2019 0825   K 4.2 08/03/2020 1104   CL 105 08/03/2020 1104   CO2 22 08/03/2020 1104   GLUCOSE 144 (H) 08/03/2020 1104   BUN 37 (H) 08/03/2020 1104  BUN 27 (H) 07/15/2019 0825   CREATININE 1.29 (H) 08/03/2020 1104   CREATININE 1.12 (H) 06/05/2019 0710   CALCIUM 9.2 08/03/2020 1104   PROT 6.7 03/20/2019 1658   ALBUMIN 3.5 03/20/2019 1658   AST 17 03/20/2019 1658   ALT 23 03/20/2019 1658   ALKPHOS 104 03/20/2019 1658   BILITOT 0.7 03/20/2019 1658   GFRNONAA 48 (L) 08/03/2020 1104   GFRAA 54 (L) 10/02/2019 1228     Diabetic Labs (most recent): Lab Results  Component Value Date   HGBA1C 6.3 08/04/2020   HGBA1C 6.6 (A) 02/10/2020   HGBA1C 6.5 (A) 10/08/2019    Assessment & Plan:   1) Diabetes mellitus with stage 3 chronic kidney disease ([place])  - CINDIA HUSTEAD has currently uncontrolled symptomatic type 2 DM since  60 years of age.  She presents today with her meter and logs showing slightly above target fasting and tight postprandial glycemic profile.  Her POCT A1c today is 6.3%, improving from previous visit of 6.6%.  She denies any s/s of hypoglycemia.   Recent labs reviewed.  - I had a long discussion with her about the progressive nature of diabetes and the pathology behind its complications. -her diabetes is complicated by CHF, CKD stage 3 , smoking and she remains at a high risk for more acute and chronic complications which include CAD, CVA, CKD, retinopathy, and neuropathy. These are all discussed in detail with her.  - Nutritional counseling repeated at each appointment due to patients tendency to fall back in to old habits.  - The patient admits there is a room for improvement in their diet and drink choices. -  Suggestion is  made for the patient to avoid simple carbohydrates from their diet including Cakes, Sweet Desserts / Pastries, Ice Cream, Soda (diet and regular), Sweet Tea, Candies, Chips, Cookies, Sweet Pastries, Store Bought Juices, Alcohol in Excess of 1-2 drinks a day, Artificial Sweeteners, Coffee Creamer, and "Sugar-free" Products. This will help patient to have stable blood glucose profile and potentially avoid unintended weight gain.   - I encouraged the patient to switch to unprocessed or minimally processed complex starch and increased protein intake (animal or plant source), fruits, and vegetables.   - Patient is advised to stick to a routine mealtimes to eat 3 meals a day and avoid unnecessary snacks (to snack only to correct hypoglycemia).  - I have approached her with the following individualized plan to manage her diabetes and patient agrees:   - She did not tolerate Metformin.  -Based on her tight bedtime readings, she is advised to lower her Glipizide to 5 mg po once daily with breakfast.  She can continue her Farxiga 10 mg po daily under the supervision of her cardiologist.  -She is encouraged to continue monitoring blood glucose twice daily, before breakfast and before bed, and to call the clinic if she has readings less than 70 or greater than 200 for 3 tests in a row.    - she is not a suitable candidate for incretin therapy either due to her body habitus as well as chronic smoking history, posing risk for pancreatitis.  -Addition of basal insulin would be the safest option if she loses control on subsequent visits.  - Specific targets for  A1c;  LDL, HDL,  and Triglycerides were discussed with the patient.  2) Blood Pressure /Hypertension:  -Her blood pressure is controlled to target.   she is advised to continue her current medications including Entresto 24/26 mg  p.o. daily with breakfast, Carvedilol 6.25 mg p.o. twice daily as well as Spironolactone 25 mg p.o. daily.   3)  Lipids/Hyperlipidemia:    Her most recent lipid panel from 05/31/20 shows uncontrolled LDL of 112 and elevated triglycerides of 218.  She says her cardiologist rechecked her lipids yesterday and have not received the results yet.  She is advised to continue Atorvastatin 40 mg po daily at bedtime.  Side effects and precautions discussed with her.  4)  Weight/Diet:  Her Body mass index is 27.86 kg/m.  -She is a candidate for some weight loss. I discussed with her the fact that loss of 5 - 10% of her  current body weight will have the most impact on her diabetes management.  Exercise, and detailed carbohydrates information provided  -  detailed on discharge instructions.  5) Chronic Care/Health Maintenance:  -she  is on ACEI/ARB and Statin medications and  is encouraged to initiate and continue to follow up with Ophthalmology, Dentist, orthopedic surgeon, cardiology, podiatrist at least yearly or according to recommendations, and advised to  quit smoking. I have recommended yearly flu vaccine and pneumonia vaccine at least every 5 years; moderate intensity exercise for up to 150 minutes weekly; and  sleep for at least 7 hours a day.   The patient was counseled on the dangers of tobacco use, and was advised to quit.  Reviewed strategies to maximize success, including removing cigarettes and smoking materials from environment.   - she is advised to maintain close follow up with [name], MD for primary care needs, as well as her other providers for optimal and coordinated care.     I spent 46 minutes in the care of the patient today including review of labs from [facility], Lipids, Thyroid Function, Hematology (current and previous including abstractions from other facilities); face-to-face time discussing  her blood glucose readings/logs, discussing hypoglycemia and hyperglycemia episodes and symptoms, medications doses, her options of short and long term treatment based on the latest standards of care  / guidelines;  discussion about incorporating lifestyle medicine;  and documenting the encounter.    Please refer to Patient Instructions for Blood Glucose Monitoring and Insulin/Medications Dosing Guide"  in media tab for additional information. Please  also refer to " Patient Self Inventory" in the Media  tab for reviewed elements of pertinent patient history.  Orbie Pyo participated in the discussions, expressed understanding, and voiced agreement with the above plans.  All questions were answered to her satisfaction. she is encouraged to contact clinic should she have any questions or concerns prior to her return visit.     Follow up plan: - Return in about 6 months (around 02/03/2021) for Diabetes F/U with A1c in office, No previsit labs, Bring meter and logs.  Rayetta Pigg, [facility] [address] [place], [place] [address] Phone: [phone] Fax: [phone]  08/04/2020, 11:09 AM

## 2020-08-04 NOTE — Patient Instructions (Signed)

## 2020-08-04 NOTE — Progress Notes (Signed)
Advanced Heart Failure Clinic Note   Referring Physician: PCP: [name], MD PCP-Cardiologist: Rozann Lesches, MD  HF Cardiology: Dr. Aundra Dubin   HPI:  60 y.o. female w/ history of DM, HTN, hyperlipidemia, and smoking.  She was admitted on 1/22 for about 1 week of shortness of breath. She had been started on azithromycin and prednisone by PCP and was taking inhalers for ?COPD exacerbation without relief.  COVID negative.  Dyspnea primarily exertional, no orthopnea. No chest pain reported.  In ER at [facility], pro-BNP was elevated and CXR suggestive of volume overload. Slightly elevated HS-TnI with no trend, not suggestive of ACS.  She was admitted for management of new diagnosis of CHF and seen by Dr. Domenic Polite.  Echo was done, showed EF 20-25% with mild RV dysfunction. She was started on IV Lasix for diuresed then transitioned to PO.  She was transferred from [facility] to [facility] for RHC/LHC to assess filling pressures/CO and also to assess for coronary disease as etiology.    Of note, her father developed CHF in his 56s, nonischemic cardiomyopathy.  He had ICD and later heart transplant.  Grandmother also had CHF.   Cath was done 1/25 and showed no significant coronary disease and optimized filling pressures with preserved cardiac output (see angiographic and hemodynamic data below). Diagnosed w/ NICM. cMRI showed no myocardial LGE, so no definitive evidence for prior MI, infiltrative disease, or cardiomyopathy. RV normal w/ EF 35%. She was started on GDMT w/ ARB, ? blocker and MRA + loop diuretic.   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 Broadus John, thought to be probably be a benign variant.   Echo 5/21 with EF 30-35% (closer to 35%), diffuse hypokinesis, mildly decreased RV systolic function, normal IVC. Echo in 11/21 showed EF 45%, diffuse hypokinesis, mildly decreased RV systolic function.   She presents for followup of CHF.  She is still smoking,  Wellbutrin and nicotine patches failed.  SBP 90s-100s, rarely has mild lightheadedness with standing.  No falls.  No chest pain.  No dyspnea with her usual ADLs.   ECG (personally reviewed): NSR, left axis deviation, poor RWP  Labs (8/21): K 4.5, creatinine 1.33 Labs (11/21): K 4.4, creatinine 1.1 Labs (2/22): LDL 38, TGs 203 Labs (4/22): LDL 112  PMH: 1. Hyperlipidemia 2. Type 2 diabetes.  3. Chronic systolic CHF:  - Echo (6/54).  EF 20-25%, mildly decreased RV function.  - RHC/LHC (1/21): Nonobstructive CAD; mean RA 9, PA 28/5, mean PCWP 9, CI 2.37 - Cardiac MRI (1/21): LV EF 31%, RVEF 28%, No myocardial LGE.  - Echo (5/21): EF 30-35% (closer to 35%), diffuse hypokinesis, mildly decreased RV systolic function, normal IVC. 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  - Echo (11/21): EF 45%, diffuse hypokinesis, mildly decreased RV systolic function.  4. Smoker  Review of Systems: All systems reviewed and negative except as per HPI.   Current Outpatient Medications  Medication Sig Dispense Refill   acetaminophen (TYLENOL 8 HOUR) 650 MG CR tablet Take 1 tablet (650 mg total) by mouth every 8 (eight) hours as needed for pain or fever. 20 tablet 0   alprazolam (XANAX) 2 MG tablet Take 2 mg by mouth daily as needed.     atorvastatin (LIPITOR) 40 MG tablet Take 40 mg by mouth daily.     carvedilol (COREG) 6.25 MG tablet Take 1.5 tablets (9.375 mg total) by mouth 2 (  two) times daily with a meal. 270 tablet 3   Cetirizine HCl 10 MG CAPS Take 10 mg by mouth daily as needed.     Cholecalciferol (VITAMIN D3) 125 MCG (5000 UT) CAPS Take 1 capsule by mouth daily.     FARXIGA 10 MG TABS tablet TAKE ONE TABLET (10MG  TOTAL) BY MOUTH DAILY 30 tablet 5   fenofibrate (TRICOR) 48 MG tablet Take 1 tablet (48 mg total) by mouth daily. 90 tablet 3   FLUoxetine (PROZAC) 40 MG capsule Take 40 mg by mouth daily.     fluticasone  (FLONASE) 50 MCG/ACT nasal spray Place 2 sprays into both nostrils daily.     glipiZIDE (GLUCOTROL) 5 MG tablet Take 5 mg by mouth daily.     loperamide (IMODIUM) 2 MG capsule TAKE 2 CAPSULES BY MOUTH INITIALLY, THEN TAKE 1 CAPSULE AFTER EACH LOOSE STOOL. DO NOT EXCEED 8 CAPSULES IN 24 HOURS.     LORazepam (ATIVAN) 1 MG tablet Take 1 mg by mouth 2 (two) times daily.     melatonin 3 MG TABS tablet Take 3 mg by mouth at bedtime.     nicotine (NICODERM CQ - DOSED IN MG/24 HOURS) 14 mg/24hr patch Place 14 mg onto the skin daily.     pantoprazole (PROTONIX) 40 MG tablet Take 1 tablet (40 mg total) by mouth daily. 30 tablet 0   sacubitril-valsartan (ENTRESTO) 24-26 MG Take 1 tablet by mouth 2 (two) times daily. 60 tablet 11   spironolactone (ALDACTONE) 25 MG tablet TAKE ONE TABLET (25MG  TOTAL) BY MOUTH DAILY 90 tablet 0   zolpidem (AMBIEN) 10 MG tablet Take 10 mg by mouth at bedtime as needed for sleep.     Accu-Chek FastClix Lancets MISC Use to check glucose twice daily with meals 204 each 2   ACCU-CHEK GUIDE test strip Use as instructed to monitor glucose twice daily 400 each 3   No current facility-administered medications for this encounter.    No Known Allergies    Social History   Socioeconomic History   Marital status: Divorced    Spouse name: Not on file   Number of children: Not on file   Years of education: Not on file   Highest education level: Not on file  Occupational History   Not on file  Tobacco Use   Smoking status: Every Day    Packs/day: 0.25    Years: 37.00    Pack years: 9.25    Types: Cigarettes   Smokeless tobacco: Never   Tobacco comments:    1-2 cigarettes daily  Vaping Use   Vaping Use: Never used  Substance and Sexual Activity   Alcohol use: No   Drug use: No   Sexual activity: Yes    Birth control/protection: None  Other Topics Concern   Not on file  Social History Narrative   Not on file   Social Determinants of Health   Financial Resource  Strain: Not on file  Food Insecurity: Not on file  Transportation Needs: Not on file  Physical Activity: Not on file  Stress: Not on file  Social Connections: Not on file  Intimate Partner Violence: Not on file      Family History  Problem Relation Age of Onset   Arthritis Mother    Hernia Mother    Constipation Mother    Heart failure Father        Underwent heart transplant   Colon cancer Neg Hx     Vitals:   08/03/20  1053  BP: (!) 100/58  Pulse: 72  SpO2: 95%  Weight: 77.6 kg (171 lb)   PHYSICAL EXAM: General: NAD Neck: No JVD, no thyromegaly or thyroid nodule.  Lungs: Decreased BS bilaterally.  CV: Nondisplaced PMI.  Heart regular S1/S2, no S3/S4, no murmur.  No peripheral edema.  No carotid bruit.  Normal pedal pulses.  Abdomen: Soft, nontender, no hepatosplenomegaly, no distention.  Skin: Intact without lesions or rashes.  Neurologic: Alert and oriented x 3.  Psych: Normal affect. Extremities: No clubbing or cyanosis.  HEENT: Normal.   ASSESSMENT & PLAN:  1. Chronic Systolic CHF: Echo 7/26 w/ EF 20-25%, mild RV dysfunction.  No prior cardiac history but father has history of nonischemic cardiomyopathy with heart transplant and paternal grandmother also had CHF.  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  LHC/RHC 1/21 showed no significant coronary disease and optimized filling pressures with preserved cardiac output. cMRI showed no myocardial LGE, so no definitive evidence for prior MI, infiltrative disease, or cardiomyopathy.  Echo 5/21 showed mild improvement in LV function, EF 30-35% (probably closer to 35%).  Echo in 11/21 showed EF up to 45%. She is not volume overloaded, NYHA class 2 symptoms.  BP soft but no lightheadedness, do not think she has BP room to increase meds.  - Continue Entresto 24-26 bid.  - Continue spironolactone 25 mg daily. BMET today.  - Continue Coreg 9.375 mg bid.   - Continue dapagliflozin 10 mg daily.  - She is now out of ICD range. 2. Smoking: Still smoking a few cigs/day.  She has failed nicotine patches and wellbutrin.  Unable to get Chantix at this time (has worked for her in the past).  3. HTN: BP now low.  4. Type 2 diabetes: Sees an endocrinologist.  5. Hyperlipidemia: Continue statin, check lipids today (was well-controlled, but more recent check was higher). .   Followup in 6 months.   Loralie Champagne, MD 08/04/20

## 2020-08-05 ENCOUNTER — Other Ambulatory Visit (HOSPITAL_COMMUNITY): Payer: Self-pay | Admitting: Surgery

## 2020-08-05 MED ORDER — ATORVASTATIN CALCIUM 40 MG PO TABS: 40.0000 mg | ORAL_TABLET | Freq: Every day | ORAL | 3 refills | Status: DC

## 2020-08-05 NOTE — Progress Notes (Signed)
Patient denies having Atorvastatin at home and says she has not been taking.  I sent the prescription for Atorvastatin 40 mg daily to her pharmacy of choice and she will begin taking the medication today.

## 2020-08-10 ENCOUNTER — Other Ambulatory Visit: Payer: Self-pay

## 2020-08-10 ENCOUNTER — Ambulatory Visit (HOSPITAL_COMMUNITY)
Admission: RE | Admit: 2020-08-10 | Discharge: 2020-08-10 | Disposition: A | Discharge status: Home / Self Care | Payer: Medicaid Other | Source: Ambulatory Visit | Attending: Family Medicine | Admitting: Family Medicine

## 2020-08-10 ENCOUNTER — Ambulatory Visit: Payer: Medicaid Other | Admitting: Nurse Practitioner

## 2020-08-10 DIAGNOSIS — Z1231 Encounter for screening mammogram for malignant neoplasm of breast: Secondary | ICD-10-CM | POA: Diagnosis not present

## 2020-08-25 ENCOUNTER — Telehealth: Payer: Self-pay

## 2020-08-25 NOTE — Telephone Encounter (Signed)
No new patient per [place] 

## 2020-09-26 DIAGNOSIS — J45909 Unspecified asthma, uncomplicated: Secondary | ICD-10-CM | POA: Insufficient documentation

## 2020-09-26 DIAGNOSIS — B0229 Other postherpetic nervous system involvement: Secondary | ICD-10-CM | POA: Insufficient documentation

## 2020-09-26 DIAGNOSIS — K649 Unspecified hemorrhoids: Secondary | ICD-10-CM | POA: Insufficient documentation

## 2020-09-26 DIAGNOSIS — F419 Anxiety disorder, unspecified: Secondary | ICD-10-CM | POA: Insufficient documentation

## 2020-09-26 DIAGNOSIS — K8060 Calculus of gallbladder and bile duct with cholecystitis, unspecified, without obstruction: Secondary | ICD-10-CM | POA: Insufficient documentation

## 2020-09-26 DIAGNOSIS — E669 Obesity, unspecified: Secondary | ICD-10-CM | POA: Insufficient documentation

## 2020-09-26 DIAGNOSIS — F17200 Nicotine dependence, unspecified, uncomplicated: Secondary | ICD-10-CM | POA: Insufficient documentation

## 2020-09-26 DIAGNOSIS — N951 Menopausal and female climacteric states: Secondary | ICD-10-CM | POA: Insufficient documentation

## 2020-09-26 DIAGNOSIS — E559 Vitamin D deficiency, unspecified: Secondary | ICD-10-CM | POA: Insufficient documentation

## 2020-09-26 DIAGNOSIS — F4321 Adjustment disorder with depressed mood: Secondary | ICD-10-CM | POA: Insufficient documentation

## 2020-09-26 DIAGNOSIS — K635 Polyp of colon: Secondary | ICD-10-CM | POA: Insufficient documentation

## 2020-09-26 DIAGNOSIS — E781 Pure hyperglyceridemia: Secondary | ICD-10-CM | POA: Insufficient documentation

## 2020-09-26 DIAGNOSIS — N183 Chronic kidney disease, stage 3 unspecified: Secondary | ICD-10-CM | POA: Insufficient documentation

## 2020-09-26 DIAGNOSIS — K219 Gastro-esophageal reflux disease without esophagitis: Secondary | ICD-10-CM | POA: Insufficient documentation

## 2020-09-26 DIAGNOSIS — G4700 Insomnia, unspecified: Secondary | ICD-10-CM | POA: Insufficient documentation

## 2020-09-26 DIAGNOSIS — K5730 Diverticulosis of large intestine without perforation or abscess without bleeding: Secondary | ICD-10-CM | POA: Insufficient documentation

## 2020-09-28 ENCOUNTER — Telehealth: Payer: Self-pay

## 2020-09-28 MED ORDER — ACCU-CHEK GUIDE VI STRP: ORAL_STRIP | 3 refills | Status: DC

## 2020-09-28 MED ORDER — ACCU-CHEK FASTCLIX LANCETS MISC: 2 refills | Status: DC

## 2020-09-28 NOTE — Telephone Encounter (Signed)
Pt states she went to refill her Lancets and [place] said she needed to contact us for a refill in Whitney's name (this was sent in back in June, but this is what they are telling her) Can you re send those? Also, she is not seeing Dr Karie Kirks anymore and so she needs a new RX sent in for her Glipizide as well.

## 2020-10-11 ENCOUNTER — Other Ambulatory Visit (HOSPITAL_COMMUNITY): Payer: Self-pay | Admitting: Cardiology

## 2020-10-21 ENCOUNTER — Other Ambulatory Visit (HOSPITAL_COMMUNITY): Payer: Self-pay | Admitting: Nephrology

## 2020-10-21 DIAGNOSIS — I9589 Other hypotension: Secondary | ICD-10-CM

## 2020-10-21 DIAGNOSIS — I5042 Chronic combined systolic (congestive) and diastolic (congestive) heart failure: Secondary | ICD-10-CM

## 2020-10-21 DIAGNOSIS — E872 Acidosis, unspecified: Secondary | ICD-10-CM

## 2020-10-21 DIAGNOSIS — E1122 Type 2 diabetes mellitus with diabetic chronic kidney disease: Secondary | ICD-10-CM

## 2020-10-28 ENCOUNTER — Ambulatory Visit (HOSPITAL_COMMUNITY)
Admission: RE | Admit: 2020-10-28 | Discharge: 2020-10-28 | Disposition: A | Discharge status: Home / Self Care | Payer: Medicaid Other | Source: Ambulatory Visit | Attending: Nephrology | Admitting: Nephrology

## 2020-10-28 ENCOUNTER — Other Ambulatory Visit: Payer: Self-pay

## 2020-10-28 DIAGNOSIS — I9589 Other hypotension: Secondary | ICD-10-CM | POA: Diagnosis present

## 2020-10-28 DIAGNOSIS — E872 Acidosis, unspecified: Secondary | ICD-10-CM

## 2020-10-28 DIAGNOSIS — E1122 Type 2 diabetes mellitus with diabetic chronic kidney disease: Secondary | ICD-10-CM | POA: Insufficient documentation

## 2020-10-28 DIAGNOSIS — I5042 Chronic combined systolic (congestive) and diastolic (congestive) heart failure: Secondary | ICD-10-CM | POA: Insufficient documentation

## 2020-11-28 ENCOUNTER — Other Ambulatory Visit (HOSPITAL_COMMUNITY): Payer: Self-pay | Admitting: Cardiology

## 2020-12-05 ENCOUNTER — Encounter: Payer: Self-pay | Admitting: Cardiology

## 2020-12-05 ENCOUNTER — Ambulatory Visit: Payer: Medicaid Other | Admitting: Cardiology

## 2020-12-05 VITALS — BP 114/70 | HR 66 | Ht 66.0 in | Wt 172.4 lb

## 2020-12-05 DIAGNOSIS — I428 Other cardiomyopathies: Secondary | ICD-10-CM | POA: Diagnosis not present

## 2020-12-05 DIAGNOSIS — N1832 Chronic kidney disease, stage 3b: Secondary | ICD-10-CM | POA: Diagnosis not present

## 2020-12-05 NOTE — Patient Instructions (Addendum)
Medication Instructions:  Your physician recommends that you continue on your current medications as directed. Please refer to the Current Medication list given to you today.  Labwork: none  Testing/Procedures: Your physician has requested that you have an echocardiogram. Echocardiography is a painless test that uses sound waves to create images of your heart. It provides your doctor with information about the size and shape of your heart and how well your heart's chambers and valves are working. This procedure takes approximately one hour. There are no restrictions for this procedure.  Follow-Up: Your physician recommends that you schedule a follow-up appointment in: 6 month  Any Other Special Instructions Will Be Listed Below (If Applicable).  If you need a refill on your cardiac medications before your next appointment, please call your pharmacy.

## 2020-12-05 NOTE — Progress Notes (Signed)
Cardiology Office Note  Date: 12/05/2020   ID: Shaquna, Geigle Dec 25, 1960, MRN [id number]  PCP:  Wilburt Finlay, MD  Cardiologist:  Rozann Lesches, MD Electrophysiologist:  None   Chief Complaint  Patient presents with   Cardiac follow-up    History of Present Illness: [name] is a 60 y.o. female last seen in December 2021 and more recently by Dr. Aundra Dubin in the heart failure clinic in June, I reviewed the note.  She is here for a follow-up visit.  Doing well, NYHA class I-II dyspnea with typical activities, no palpitations or syncope.  Her weight has been stable, she denies any orthopnea or PND.  Echocardiogram in November 2021 revealed LVEF approximately 45% with mild diastolic dysfunction, mildly decreased RV contraction.  I reviewed her medications which are stable from a cardiac perspective and outlined below.  She runs a low to low normal blood pressure limiting further up titration of therapy.  I did review her recent lab work.  Past Medical History:  Diagnosis Date   Anal fissure    Anxiety    Chronic systolic (congestive) heart failure ([place])    a. 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44   Mixed hyperlipidemia    Nonischemic cardiomyopathy ([place])    Type 2 diabetes mellitus ([place])     Past Surgical History:  Procedure Laterality Date   BACK SURGERY  2005   lumbar disckectomy   COLONOSCOPY  10/01/2011   Procedure: COLONOSCOPY;  Surgeon: [name], MD;  [facility];  Service: Endoscopy;  Laterality: N/A;  730   COLONOSCOPY N/A 10/04/2016   Procedure: COLONOSCOPY;  Surgeon: [name], MD;  Location: [facility];  Service: Endoscopy;  Laterality: N/A;  10:30   DILATION AND CURETTAGE OF UTERUS     FOOT BONE EXCISION     right   POLYPECTOMY  10/04/2016   Procedure:  POLYPECTOMY;  Surgeon: [name], MD;  Location: [facility];  Service: Endoscopy;;  colon   RIGHT/LEFT HEART CATH AND CORONARY ANGIOGRAPHY N/A 03/23/2019   Procedure: RIGHT/LEFT HEART CATH AND CORONARY ANGIOGRAPHY;  Surgeon: [name], MD;  Location: [facility];  Service: Cardiovascular;  Laterality: N/A;    Current Outpatient Medications  Medication Sig Dispense Refill   Accu-Chek FastClix Lancets MISC Use to check glucose twice daily with meals 204 each 2   ACCU-CHEK GUIDE test strip Use as instructed to monitor glucose twice daily 400 each 3   acetaminophen (TYLENOL 8 HOUR) 650 MG CR tablet Take 1 tablet (650 mg total) by mouth every 8 (eight) hours as needed for pain or fever. 20 tablet 0   atorvastatin (LIPITOR) 40 MG tablet TAKE ONE (1) TABLET BY MOUTH EVERY DAY 30 tablet 3   busPIRone (BUSPAR) 7.5 MG tablet Take 7.5 mg by mouth 2 (two) times daily.     carvedilol (COREG) 6.25 MG tablet Take 1.5 tablets (9.375 mg total) by mouth 2 (two) times daily with a meal. 270 tablet 3   Cholecalciferol (VITAMIN D3) 125 MCG (5000 UT) CAPS Take 1 capsule by mouth daily.     FARXIGA 10 MG TABS tablet TAKE ONE TABLET (10MG  TOTAL) BY MOUTH DAILY 30 tablet 5   fenofibrate (TRICOR) 48 MG  tablet Take 1 tablet (48 mg total) by mouth daily. 90 tablet 3   FLUoxetine (PROZAC) 40 MG capsule Take 40 mg by mouth daily.     fluticasone (FLONASE) 50 MCG/ACT nasal spray Place 2 sprays into both nostrils daily.     glipiZIDE (GLUCOTROL) 5 MG tablet Take 5 mg by mouth 2 (two) times daily before a meal.     HM LORATADINE 10 MG tablet Take 10 mg by mouth daily.     loperamide (IMODIUM) 2 MG capsule TAKE 2 CAPSULES BY MOUTH INITIALLY, THEN TAKE 1 CAPSULE AFTER EACH LOOSE STOOL. DO NOT EXCEED 8 CAPSULES IN 24 HOURS.     melatonin 3 MG TABS tablet Take 3 mg by mouth at bedtime.     pantoprazole (PROTONIX) 40 MG tablet Take 1 tablet (40 mg total) by mouth daily. 30 tablet 0   sacubitril-valsartan  (ENTRESTO) 24-26 MG Take 1 tablet by mouth 2 (two) times daily. 60 tablet 11   spironolactone (ALDACTONE) 25 MG tablet TAKE ONE TABLET (25MG  TOTAL) BY MOUTH DAILY 90 tablet 3   zolpidem (AMBIEN) 10 MG tablet Take 10 mg by mouth at bedtime as needed for sleep.     No current facility-administered medications for this visit.   Allergies:  Metformin   ROS: No palpitations or syncope.  Physical Exam: VS:  BP 114/70   Pulse 66   Ht 5\' 6"  (1.676 m)   Wt 172 lb 6.4 oz (78.2 kg)   LMP 09/25/2011   SpO2 98%   BMI 27.83 kg/m , BMI Body mass index is 27.83 kg/m.  Wt Readings from Last 3 Encounters:  12/05/20 172 lb 6.4 oz (78.2 kg)  08/04/20 172 lb 9.6 oz (78.3 kg)  08/03/20 171 lb (77.6 kg)    General: Patient appears comfortable at rest. HEENT: Conjunctiva and lids normal, wearing a mask. Neck: Supple, no elevated JVP or carotid bruits, no thyromegaly. Lungs: Clear to auscultation, nonlabored breathing at rest. Cardiac: Regular rate and rhythm, no S3 or significant systolic murmur. Extremities: No pitting edema.  ECG:  An ECG dated 08/03/2020 was personally reviewed today and demonstrated:  Sinus rhythm with left anterior fascicular block/IVCD.  Recent Labwork: 08/03/2020: BUN 37; Creatinine, Ser 1.29; Potassium 4.2; Sodium 136     Component Value Date/Time   CHOL 210 (H) 08/03/2020 1104   TRIG 240 (H) 08/03/2020 1104   HDL 36 (L) 08/03/2020 1104   CHOLHDL 5.8 08/03/2020 1104   VLDL 48 (H) 08/03/2020 1104   LDLCALC 126 (H) 08/03/2020 1104  August 2022: Creatinine 1.51, hemoglobin 13.3, platelets 224, hemoglobin A1c 6.5%  Other Studies Reviewed Today:  Echocardiogram 01/04/2020:  1. Left ventricular ejection fraction, by estimation, is 45%. The left  ventricle has mildly decreased function. The left ventricle demonstrates  global hypokinesis. Left ventricular diastolic parameters are consistent  with Grade I diastolic dysfunction  (impaired relaxation).   2. Right ventricular  systolic function is mildly reduced. The right  ventricular size is normal. Tricuspid regurgitation signal is inadequate  for assessing PA pressure.   3. The mitral valve is normal in structure. No evidence of mitral valve  regurgitation. No evidence of mitral stenosis.   4. The aortic valve is tricuspid. Aortic valve regurgitation is not  visualized. No aortic stenosis is present.   5. The inferior vena cava is normal in size with greater than 50%  respiratory variability, suggesting right atrial pressure of 3 mmHg.   Assessment and Plan:  1.  Nonischemic cardiomyopathy with  chronic systolic heart failure.  She is clinically quite stable on current regimen which includes Entresto, Coreg, Aldactone, and [place].  I reviewed her recent lab work.  Plan is to obtain a follow-up echocardiogram in comparison to the study from November 2021.  Keep follow-up with Dr. Aundra Dubin as scheduled.  2.  Essential hypertension by history, blood pressure is normal today.  3.  CKD stage IIIb.  She is following with Dr. Theador Hawthorne.  Medication Adjustments/Labs and Tests Ordered: Current medicines are reviewed at length with the patient today.  Concerns regarding medicines are outlined above.   Tests Ordered: Orders Placed This Encounter  Procedures   ECHOCARDIOGRAM COMPLETE     Medication Changes: No orders of the defined types were placed in this encounter.   Disposition:  Follow up  6 months.  Signed, Satira Sark, MD, [facility] 12/05/2020 12:16 PM    [facility] [place], [place],  [address] Phone: [phone]; Fax: [phone]

## 2021-01-10 ENCOUNTER — Other Ambulatory Visit (HOSPITAL_COMMUNITY): Payer: Self-pay | Admitting: Cardiology

## 2021-01-10 ENCOUNTER — Ambulatory Visit (HOSPITAL_COMMUNITY)
Admission: RE | Admit: 2021-01-10 | Discharge: 2021-01-10 | Disposition: A | Discharge status: Home / Self Care | Payer: Medicaid Other | Source: Ambulatory Visit | Attending: Cardiology | Admitting: Cardiology

## 2021-01-10 ENCOUNTER — Other Ambulatory Visit: Payer: Self-pay

## 2021-01-10 DIAGNOSIS — I428 Other cardiomyopathies: Secondary | ICD-10-CM | POA: Insufficient documentation

## 2021-01-10 LAB — ECHOCARDIOGRAM COMPLETE
Area-P 1/2: 3.91 cm2
Calc EF: 54.8 %
S' Lateral: 3.7 cm
Single Plane A2C EF: 54.7 %
Single Plane A4C EF: 53.8 %

## 2021-01-10 NOTE — Progress Notes (Signed)
*  PRELIMINARY RESULTS* Echocardiogram 2D Echocardiogram has been performed.  [name] 01/10/2021, 11:13 AM

## 2021-01-11 ENCOUNTER — Telehealth: Payer: Self-pay | Admitting: *Deleted

## 2021-01-11 NOTE — Telephone Encounter (Signed)
-----   Message from Satira Sark, MD sent at 01/10/2021  2:37 PM EST ----- Results reviewed.  Relatively stable LVEF, 45 to 50% with mild global hypokinesis.  Continue with current medications and follow-up plan.

## 2021-01-11 NOTE — Telephone Encounter (Signed)
Patient informed. Copy sent to PCP °

## 2021-01-23 LAB — HM DIABETES EYE EXAM

## 2021-01-25 ENCOUNTER — Encounter: Payer: Self-pay | Admitting: Vascular Surgery

## 2021-01-25 ENCOUNTER — Other Ambulatory Visit: Payer: Self-pay

## 2021-01-25 ENCOUNTER — Ambulatory Visit: Payer: Medicaid Other | Admitting: Vascular Surgery

## 2021-01-25 VITALS — BP 106/68 | HR 92 | Temp 97.2°F | Wt 171.4 lb

## 2021-01-25 DIAGNOSIS — I70211 Atherosclerosis of native arteries of extremities with intermittent claudication, right leg: Secondary | ICD-10-CM | POA: Diagnosis not present

## 2021-01-25 NOTE — Progress Notes (Signed)
Vascular and Vein Specialist of [place]  Patient name: [name] MRN: [id number] DOB: [birth year] Sex: female  REASON FOR CONSULT: Evaluation intermittent claudication right leg  HPI: [name] is a 60 y.o. female, who is here today for evaluation.  She reports that over the past 3 to 4 months she has noticed eye discomfort mainly in her right posterior thigh extending into her knee area with ambulation.  She reports this is worse if she is trying to roll her trash cans to the street or any activity more than just slow walking.  If she stops this resolves.  She does not have any history of tissue loss.  She does not have any left leg symptoms.  She does have history of cardiac disease and history of heart failure with an EF of 20 to 25%.  She is type II diabetic.  She is a cigarette smoker.  Past Medical History:  Diagnosis Date   Anal fissure    Anxiety    Chronic systolic (congestive) heart failure (HCC)    a. 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51   Mixed hyperlipidemia    Nonischemic cardiomyopathy ([place])    Type 2 diabetes mellitus ([place])     Family History  Problem Relation Age of Onset   Arthritis Mother    Hernia Mother    Constipation Mother    Heart failure Father        Underwent heart transplant   Colon cancer Neg Hx     SOCIAL HISTORY: Social History   Socioeconomic History   Marital status: Divorced    Spouse name: Not on file   Number of children: Not on file   Years of education: Not on file   Highest education level: Not on file  Occupational History   Not on file  Tobacco Use   Smoking status: Every Day    Packs/day: 0.25    Years: 37.00    Pack years: 9.25    Types: Cigarettes   Smokeless tobacco: Never   Tobacco comments:    1-2 cigarettes  daily  Vaping Use   Vaping Use: Never used  Substance and Sexual Activity   Alcohol use: No   Drug use: No   Sexual activity: Yes    Birth control/protection: None  Other Topics Concern   Not on file  Social History Narrative   Not on file   Social Determinants of Health   Financial Resource Strain: Not on file  Food Insecurity: Not on file  Transportation Needs: Not on file  Physical Activity: Not on file  Stress: Not on file  Social Connections: Not on file  Intimate Partner Violence: Not on file    Allergies  Allergen Reactions   Metformin     Causes GI issues    Current Outpatient Medications  Medication Sig Dispense Refill   aspirin EC 81 MG tablet Take 81 mg by mouth daily. Swallow whole.     atorvastatin (LIPITOR) 40 MG tablet TAKE ONE (1) TABLET BY MOUTH EVERY DAY 30 tablet 3   busPIRone (BUSPAR) 7.5 MG tablet Take 7.5 mg by mouth 2 (two) times daily.     carvedilol (COREG) 6.25 MG tablet Take 1.5 tablets (9.375 mg total) by mouth 2 (two) times daily  with a meal. 270 tablet 3   Cholecalciferol (VITAMIN D3) 125 MCG (5000 UT) CAPS Take 1 capsule by mouth daily.     FARXIGA 10 MG TABS tablet TAKE ONE TABLET (10MG  TOTAL) BY MOUTH DAILY 30 tablet 5   fenofibrate (TRICOR) 48 MG tablet Take 1 tablet (48 mg total) by mouth daily. 90 tablet 3   FLUoxetine (PROZAC) 40 MG capsule Take 40 mg by mouth daily.     fluticasone (FLONASE) 50 MCG/ACT nasal spray Place 2 sprays into both nostrils daily.     glipiZIDE (GLUCOTROL) 5 MG tablet Take 5 mg by mouth 2 (two) times daily before a meal.     HM LORATADINE 10 MG tablet Take 10 mg by mouth daily.     loperamide (IMODIUM) 2 MG capsule TAKE 2 CAPSULES BY MOUTH INITIALLY, THEN TAKE 1 CAPSULE AFTER EACH LOOSE STOOL. DO NOT EXCEED 8 CAPSULES IN 24 HOURS.     melatonin 3 MG TABS tablet Take 3 mg by mouth at bedtime.     pantoprazole (PROTONIX) 40 MG tablet Take 1 tablet (40 mg total) by mouth daily. 30 tablet 0   spironolactone  (ALDACTONE) 25 MG tablet TAKE ONE TABLET (25MG  TOTAL) BY MOUTH DAILY 90 tablet 3   zolpidem (AMBIEN) 10 MG tablet Take 10 mg by mouth at bedtime as needed for sleep.     Accu-Chek FastClix Lancets MISC Use to check glucose twice daily with meals 204 each 2   ACCU-CHEK GUIDE test strip Use as instructed to monitor glucose twice daily 400 each 3   acetaminophen (TYLENOL 8 HOUR) 650 MG CR tablet Take 1 tablet (650 mg total) by mouth every 8 (eight) hours as needed for pain or fever. 20 tablet 0   sacubitril-valsartan (ENTRESTO) 24-26 MG Take 1 tablet by mouth 2 (two) times daily. (Patient not taking: Reported on 01/25/2021) 60 tablet 11   No current facility-administered medications for this visit.    REVIEW OF SYSTEMS:  [X]  denotes positive finding, [ ]  denotes negative finding Cardiac  Comments:  Chest pain or chest pressure:    Shortness of breath upon exertion:    Short of breath when lying flat:    Irregular heart rhythm:        Vascular    Pain in calf, thigh, or hip brought on by ambulation: x   Pain in feet at night that wakes you up from your sleep:     Blood clot in your veins:    Leg swelling:         Pulmonary    Oxygen at home:    Productive cough:     Wheezing:         Neurologic    Sudden weakness in arms or legs:     Sudden numbness in arms or legs:     Sudden onset of difficulty speaking or slurred speech:    Temporary loss of vision in one eye:     Problems with dizziness:         Gastrointestinal    Blood in stool:     Vomited blood:         Genitourinary    Burning when urinating:     Blood in urine:        Psychiatric    Major depression:         Hematologic    Bleeding problems:    Problems with blood clotting too easily:        Skin  Rashes or ulcers:        Constitutional    Fever or chills:      PHYSICAL EXAM: Vitals:   01/25/21 1345  BP: 106/68  Pulse: 92  Temp: (!) 97.2 F (36.2 C)  TempSrc: Skin  SpO2: 95%  Weight: 171 lb  6.4 oz (77.7 kg)    GENERAL: The patient is a well-nourished female, in no acute distress. The vital signs are documented above. CARDIOVASCULAR: Carotid arteries without bruits bilaterally.  2+ radial pulses bilaterally.  She does have a 2+ dorsalis pedis pulse.  I do not palpate pedal pulse on the right.  She does have palpable femoral pulses bilaterally.  Right may be slightly more pronounced than left. PULMONARY: There is good air exchange  MUSCULOSKELETAL: There are no major deformities or cyanosis. NEUROLOGIC: No focal weakness or paresthesias are detected. SKIN: There are no ulcers or rashes noted. PSYCHIATRIC: The patient has a normal affect.  DATA:  Noninvasive test from [facility] were reviewed.  This revealed ankle arm index of 0.  6 8 on the right and 1.0 on the left  MEDICAL ISSUES: Right leg intermittent claudication.  This is moderately limiting to her.  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  She does have a right femoral pulse.  I discussed options to include observation only versus arteriography with possible stenting.  She reports that this is not terribly limiting to her and she does not notice this on a daily basis.  I explained that this is certainly not to the level of critical limb ischemia and would feel comfortable with observation only.  I did discuss the direct relationship between her atherosclerotic disease and cigarette smoking and encouraged her to continue her attempts to stop.  We will see her again in 6 months for continued discussion and she will notify us should she develop any worsening claudication or critical limb ischemia  She reports that her ophthalmologist has requested that she get a carotid duplex for evaluation of changes he saw on her eye exam.  She is requested that we do this here in our office since she is uncomfortable driving in [place].  We will coordinate this at her earliest [place].  Padraig Nhan, MD [facility] Vascular and Vein Specialists of [facility] Tel [phone] Pager [phone]  Note: Portions of this report may have been transcribed using voice recognition software.  Every effort has been made to ensure accuracy; however, inadvertent computerized transcription errors may still be present.

## 2021-01-27 ENCOUNTER — Other Ambulatory Visit: Payer: Self-pay

## 2021-01-27 DIAGNOSIS — Z136 Encounter for screening for cardiovascular disorders: Secondary | ICD-10-CM

## 2021-01-31 ENCOUNTER — Other Ambulatory Visit (HOSPITAL_COMMUNITY): Payer: Self-pay | Admitting: Cardiology

## 2021-02-01 ENCOUNTER — Ambulatory Visit (HOSPITAL_COMMUNITY)
Admission: RE | Admit: 2021-02-01 | Discharge: 2021-02-01 | Disposition: A | Discharge status: Home / Self Care | Payer: Medicaid Other | Source: Ambulatory Visit | Attending: Cardiology | Admitting: Cardiology

## 2021-02-01 ENCOUNTER — Encounter (HOSPITAL_COMMUNITY): Payer: Self-pay | Admitting: Cardiology

## 2021-02-01 ENCOUNTER — Other Ambulatory Visit: Payer: Self-pay

## 2021-02-01 VITALS — BP 98/58 | HR 78 | Wt 171.4 lb

## 2021-02-01 DIAGNOSIS — E119 Type 2 diabetes mellitus without complications: Secondary | ICD-10-CM | POA: Diagnosis not present

## 2021-02-01 DIAGNOSIS — I428 Other cardiomyopathies: Secondary | ICD-10-CM

## 2021-02-01 DIAGNOSIS — I5022 Chronic systolic (congestive) heart failure: Secondary | ICD-10-CM | POA: Diagnosis not present

## 2021-02-01 DIAGNOSIS — I110 Hypertensive heart disease with heart failure: Secondary | ICD-10-CM | POA: Diagnosis present

## 2021-02-01 DIAGNOSIS — E785 Hyperlipidemia, unspecified: Secondary | ICD-10-CM | POA: Diagnosis not present

## 2021-02-01 DIAGNOSIS — I10 Essential (primary) hypertension: Secondary | ICD-10-CM

## 2021-02-01 DIAGNOSIS — I739 Peripheral vascular disease, unspecified: Secondary | ICD-10-CM | POA: Insufficient documentation

## 2021-02-01 DIAGNOSIS — Z79899 Other long term (current) drug therapy: Secondary | ICD-10-CM | POA: Insufficient documentation

## 2021-02-01 DIAGNOSIS — Z720 Tobacco use: Secondary | ICD-10-CM

## 2021-02-01 DIAGNOSIS — Z7984 Long term (current) use of oral hypoglycemic drugs: Secondary | ICD-10-CM | POA: Insufficient documentation

## 2021-02-01 DIAGNOSIS — F17210 Nicotine dependence, cigarettes, uncomplicated: Secondary | ICD-10-CM | POA: Diagnosis not present

## 2021-02-01 DIAGNOSIS — Z8249 Family history of ischemic heart disease and other diseases of the circulatory system: Secondary | ICD-10-CM | POA: Diagnosis not present

## 2021-02-01 LAB — BASIC METABOLIC PANEL
Anion gap: 10 (ref 5–15)
BUN: 26 mg/dL — ABNORMAL HIGH (ref 6–20)
CO2: 24 mmol/L (ref 22–32)
Calcium: 9.6 mg/dL (ref 8.9–10.3)
Chloride: 103 mmol/L (ref 98–111)
Creatinine, Ser: 1.41 mg/dL — ABNORMAL HIGH (ref 0.44–1.00)
GFR, Estimated: 43 mL/min — ABNORMAL LOW (ref >60)
Glucose, Bld: 139 mg/dL — ABNORMAL HIGH (ref 70–99)
Potassium: 4.6 mmol/L (ref 3.5–5.1)
Sodium: 137 mmol/L (ref 135–145)

## 2021-02-01 MED ORDER — CARVEDILOL 6.25 MG PO TABS: ORAL_TABLET | ORAL | 3 refills | Status: DC

## 2021-02-01 MED ORDER — FENOFIBRATE 48 MG PO TABS: 48.0000 mg | ORAL_TABLET | Freq: Every day | ORAL | 3 refills | Status: DC

## 2021-02-01 NOTE — Patient Instructions (Signed)
It was great to see you today! No medication changes are needed at this time.  Labs today We will only contact you if something comes back abnormal or we need to make some changes. Otherwise no news is good news!  Your physician recommends that you schedule a follow-up appointment in: 6 months with Dr Aundra Dubin   Do the following things EVERYDAY: Weigh yourself in the morning before breakfast. Write it down and keep it in a log. Take your medicines as prescribed Eat low salt foods--Limit salt (sodium) to 2000 mg per day.  Stay as active as you can everyday Limit all fluids for the day to less than 2 liters  At the [facility], you and your health needs are our priority. As part of our continuing mission to provide you with exceptional heart care, we have created designated Provider Care Teams. These Care Teams include your primary Cardiologist (physician) and Advanced Practice Providers (APPs- Physician Assistants and Nurse Practitioners) who all work together to provide you with the care you need, when you need it.   You may see any of the following providers on your designated Care Team at your next follow up: Dr Glori Bickers Dr Haynes Kerns, NP Lyda Jester, [place] [facility] [place], [place] Audry Riles, [name]   Please be sure to bring in all your medications bottles to every appointment.    If you have any questions or concerns before your next appointment please send [place] a message through [place] or call our office at [phone].    TO LEAVE A MESSAGE FOR THE NURSE SELECT OPTION 2, PLEASE LEAVE A MESSAGE INCLUDING: YOUR NAME DATE OF BIRTH CALL BACK NUMBER REASON FOR CALL**this is important as we prioritize the call backs  YOU WILL RECEIVE A CALL BACK THE SAME DAY AS LONG AS YOU CALL BEFORE 4:00 PM

## 2021-02-01 NOTE — Progress Notes (Addendum)
Advanced Heart Failure Clinic Note   PCP: Wilburt Finlay, MD PCP-Cardiologist: Rozann Lesches, MD  HF Cardiology: Dr. Aundra Dubin  Vascular: Dr Early   HPI: 60 y.o. female w/ history of DM, HTN, hyperlipidemia, and smoking.  She was admitted on 1/22 for about 1 week of shortness of breath. She had been started on azithromycin and prednisone by PCP and was taking inhalers for ?COPD exacerbation without relief.  COVID negative.  Dyspnea primarily exertional, no orthopnea. No chest pain reported.  In ER at [facility], pro-BNP was elevated and CXR suggestive of volume overload. Slightly elevated HS-TnI with no trend, not suggestive of ACS.  She was admitted for management of new diagnosis of CHF and seen by Dr. Domenic Polite.  Echo was done, showed EF 20-25% with mild RV dysfunction. She was started on IV Lasix for diuresed then transitioned to PO.  She was transferred from [facility] to [facility] for RHC/LHC to assess filling pressures/CO and also to assess for coronary disease as etiology.    Of note, her father developed CHF in his 78s, nonischemic cardiomyopathy.  He had ICD and later heart transplant.  Grandmother also had CHF.   Cath was done 1/25 and showed no significant coronary disease and optimized filling pressures with preserved cardiac output (see angiographic and hemodynamic data below). Diagnosed w/ NICM. cMRI showed no myocardial LGE, so no definitive evidence for prior MI, infiltrative disease, or cardiomyopathy. RV normal w/ EF 35%. She was started on GDMT w/ ARB, ? blocker and MRA + loop diuretic.   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 Broadus John, thought to be probably be a benign variant.   Echo 5/21 with EF 30-35% (closer to 35%), diffuse hypokinesis, mildly decreased RV systolic function, normal IVC. Echo in 11/21 showed EF 45%, diffuse hypokinesis, mildly decreased RV systolic function.  Echo in 11/22 showed stable EF 45-50%.   2022 ABI - Noninvasive  test from [facility] were reviewed.  This revealed ankle arm index of 0.68 on the right and 1.0 on the left  Today she returns for HF follow up.Overall feeling fine. Denies SOB/PND/Orthopnea. Legs ache after she walks a while. Continues to smoke 1/2 PPD. Appetite ok. No fever or chills. Weight at home has been stable.  Taking all medications.  Labs (8/21): K 4.5, creatinine 1.33 Labs (11/21): K 4.4, creatinine 1.1 Labs (2/22): LDL 38, TGs 203 Labs (4/22): LDL 112  PMH: 1. Hyperlipidemia 2. Type 2 diabetes.  3. Chronic systolic CHF:  - Echo (4/62).  EF 20-25%, mildly decreased RV function.  - RHC/LHC (1/21): Nonobstructive CAD; mean RA 9, PA 28/5, mean PCWP 9, CI 2.37 - Cardiac MRI (1/21): LV EF 31%, RVEF 28%, No myocardial LGE.  - Echo (5/21): EF 30-35% (closer to 35%), diffuse hypokinesis, mildly decreased RV systolic function, normal IVC. 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  - Echo (11/21): EF 45%, diffuse hypokinesis, mildly decreased RV systolic function.  - Echo ( 11/22): EF 45-50%  4. Smoker  Review of Systems: All systems reviewed and negative except as per HPI.   Current Outpatient Medications  Medication Sig Dispense Refill   Accu-Chek FastClix Lancets MISC Use to check glucose twice daily with meals 204 each 2   ACCU-CHEK GUIDE test strip Use as instructed to monitor glucose twice daily 400 each 3   acetaminophen (TYLENOL 8 HOUR) 650 MG CR tablet Take 1  tablet (650 mg total) by mouth every 8 (eight) hours as needed for pain or fever. 20 tablet 0   aspirin EC 81 MG tablet Take 81 mg by mouth daily. Swallow whole.     atorvastatin (LIPITOR) 40 MG tablet TAKE ONE (1) TABLET BY MOUTH EVERY DAY 30 tablet 3   busPIRone (BUSPAR) 7.5 MG tablet Take 7.5 mg by mouth 2 (two) times daily.     carvedilol (COREG) 6.25 MG tablet TAKE 1.5 TABLETS BY MOUTH TWICE A DAY WITH A MEAL 270 tablet 3    Cholecalciferol (VITAMIN D3) 125 MCG (5000 UT) CAPS Take 1 capsule by mouth daily.     FARXIGA 10 MG TABS tablet TAKE ONE TABLET (10MG  TOTAL) BY MOUTH DAILY 30 tablet 5   fenofibrate (TRICOR) 48 MG tablet Take 1 tablet (48 mg total) by mouth daily. 90 tablet 3   FLUoxetine (PROZAC) 40 MG capsule Take 40 mg by mouth daily.     fluticasone (FLONASE) 50 MCG/ACT nasal spray Place 2 sprays into both nostrils daily.     glipiZIDE (GLUCOTROL) 5 MG tablet Take 5 mg by mouth 2 (two) times daily before a meal.     HM LORATADINE 10 MG tablet Take 10 mg by mouth daily.     loperamide (IMODIUM) 2 MG capsule TAKE 2 CAPSULES BY MOUTH INITIALLY, THEN TAKE 1 CAPSULE AFTER EACH LOOSE STOOL. DO NOT EXCEED 8 CAPSULES IN 24 HOURS.     melatonin 3 MG TABS tablet Take 3 mg by mouth at bedtime.     pantoprazole (PROTONIX) 40 MG tablet Take 1 tablet (40 mg total) by mouth daily. 30 tablet 0   sacubitril-valsartan (ENTRESTO) 24-26 MG Take 1 tablet by mouth 2 (two) times daily. 60 tablet 11   spironolactone (ALDACTONE) 25 MG tablet TAKE ONE TABLET (25MG  TOTAL) BY MOUTH DAILY 90 tablet 3   zolpidem (AMBIEN) 10 MG tablet Take 10 mg by mouth at bedtime as needed for sleep.     No current facility-administered medications for this encounter.    Allergies  Allergen Reactions   Metformin     Causes GI issues      Social History   Socioeconomic History   Marital status: Divorced    Spouse name: Not on file   Number of children: Not on file   Years of education: Not on file   Highest education level: Not on file  Occupational History   Not on file  Tobacco Use   Smoking status: Every Day    Packs/day: 0.25    Years: 37.00    Pack years: 9.25    Types: Cigarettes   Smokeless tobacco: Never   Tobacco comments:    1-2 cigarettes daily  Vaping Use   Vaping Use: Never used  Substance and Sexual Activity   Alcohol use: No   Drug use: No   Sexual activity: Yes    Birth control/protection: None  Other Topics  Concern   Not on file  Social History Narrative   Not on file   Social Determinants of Health   Financial Resource Strain: Not on file  Food Insecurity: Not on file  Transportation Needs: Not on file  Physical Activity: Not on file  Stress: Not on file  Social Connections: Not on file  Intimate Partner Violence: Not on file      Family History  Problem Relation Age of Onset   Arthritis Mother    Hernia Mother    Constipation Mother  Heart failure Father        Underwent heart transplant   Colon cancer Neg Hx     Vitals:   02/01/21 1020  BP: (!) 98/58  Pulse: 78  SpO2: 97%  Weight: 77.7 kg (171 lb 6.4 oz)   Wt Readings from Last 3 Encounters:  02/01/21 77.7 kg (171 lb 6.4 oz)  01/25/21 77.7 kg (171 lb 6.4 oz)  12/05/20 78.2 kg (172 lb 6.4 oz)    PHYSICAL EXAM: General:  Well appearing. No resp difficulty HEENT: normal Neck: supple. no JVD. Carotids 2+ bilat; no bruits. No lymphadenopathy or thryomegaly appreciated. Cor: PMI nondisplaced. Regular rate & rhythm. No rubs, gallops or murmurs. Lungs: clear Abdomen: soft, nontender, nondistended. No hepatosplenomegaly. No bruits or masses. Good bowel sounds. Extremities: no cyanosis, clubbing, rash, edema Neuro: alert & orientedx3, cranial nerves grossly intact. moves all 4 extremities w/o difficulty. Affect pleasant  ASSESSMENT & PLAN: 1. Chronic Systolic CHF: Echo 9/76 w/ EF 20-25%, mild RV dysfunction.  No prior cardiac history but father has history of nonischemic cardiomyopathy with heart transplant and paternal grandmother also had CHF.  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  LHC/RHC 1/21 showed no significant coronary disease and optimized filling pressures with preserved cardiac output. cMRI showed no myocardial LGE, so no definitive evidence for prior MI, infiltrative disease, or cardiomyopathy.  Echo 5/21 showed mild improvement in LV  function, EF 30-35% (probably closer to 35%).  Echo 01/10/2021 EF 45-50%.  - NYHA II. Volume status stable.  - No room to titrate medications.  - Continue Entresto 24-26 bid.  - Continue spironolactone 25 mg daily.  - Continue Coreg 9.375 mg bid. Refill coreg today.  - Continue dapagliflozin 10 mg daily.  - Check BMET  2. Smoking: Still smoking a few cigs/day.  She has failed nicotine patches and wellbutrin.  Unable to get Chantix at this time (has worked for her in the past).  - Discussed cessation.  3. HTN: BP now low.  4. Type 2 diabetes: Sees an endocrinologist.  5. Hyperlipidemia: Continue statin, check lipids today (was well-controlled, but more recent check was higher).  6. PAD: Follows with Dr Donnetta Hutching. No plan for intervention unless leg pain is persistent. She has follow up in 6 months.   Check BMET. Follow up in 6 months.   Darrick Grinder, NP 02/01/21   Patient seen with NP, agree with the above note.   She is doing well, minimal exertional dyspnea and no chest pain.    General: NAD Neck: No JVD, no thyromegaly or thyroid nodule.  Lungs: Clear to auscultation bilaterally with normal respiratory effort. CV: Nondisplaced PMI.  Heart regular S1/S2, no S3/S4, no murmur.  No peripheral edema.  No carotid bruit.  Difficult to palpate pedal pulses.  Abdomen: Soft, nontender, no hepatosplenomegaly, no distention.  Skin: Intact without lesions or rashes.  Neurologic: Alert and oriented x 3.  Psych: Normal affect. Extremities: No clubbing or cyanosis.  HEENT: Normal.   She is still smoking a few cigarettes, I again encouraged her to quit.   No BP room to titrate meds today.   Send BMET today, followup 6 months.   Loralie Champagne 02/01/2021

## 2021-02-06 ENCOUNTER — Ambulatory Visit: Payer: Medicaid Other | Admitting: Nurse Practitioner

## 2021-02-06 ENCOUNTER — Other Ambulatory Visit: Payer: Self-pay

## 2021-02-06 ENCOUNTER — Encounter: Payer: Self-pay | Admitting: Nurse Practitioner

## 2021-02-06 VITALS — BP 85/53 | HR 76 | Ht 66.0 in | Wt 171.6 lb

## 2021-02-06 DIAGNOSIS — E782 Mixed hyperlipidemia: Secondary | ICD-10-CM

## 2021-02-06 DIAGNOSIS — I10 Essential (primary) hypertension: Secondary | ICD-10-CM | POA: Diagnosis not present

## 2021-02-06 DIAGNOSIS — E1122 Type 2 diabetes mellitus with diabetic chronic kidney disease: Secondary | ICD-10-CM | POA: Diagnosis not present

## 2021-02-06 DIAGNOSIS — N1832 Chronic kidney disease, stage 3b: Secondary | ICD-10-CM

## 2021-02-06 DIAGNOSIS — F17200 Nicotine dependence, unspecified, uncomplicated: Secondary | ICD-10-CM

## 2021-02-06 LAB — POCT GLYCOSYLATED HEMOGLOBIN (HGB A1C): HbA1c, POC (controlled diabetic range): 6.9 % (ref 0.0–7.0)

## 2021-02-06 MED ORDER — GLIPIZIDE 5 MG PO TABS: 5.0000 mg | ORAL_TABLET | Freq: Every day | ORAL | 3 refills | Status: DC

## 2021-02-06 NOTE — Progress Notes (Signed)
02/06/2021, 10:22 AM  Endocrinology follow-up note   Subjective:    Patient ID: [name], female    DOB: [birth year].  [name] is being seen in follow-up after she was seen in consultation for management of currently uncontrolled symptomatic diabetes requested by  Wilburt Finlay, MD.   Past Medical History:  Diagnosis Date   Anal fissure    Anxiety    Chronic systolic (congestive) heart failure ([place])    a. 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54   Mixed hyperlipidemia    Nonischemic cardiomyopathy ([place])    Type 2 diabetes mellitus ([place])     Past Surgical History:  Procedure Laterality Date   BACK SURGERY  2005   lumbar disckectomy   COLONOSCOPY  10/01/2011   Procedure: COLONOSCOPY;  Surgeon: [name], MD;  [facility];  Service: Endoscopy;  Laterality: N/A;  730   COLONOSCOPY N/A 10/04/2016   Procedure: COLONOSCOPY;  Surgeon: [name], MD;  [facility]: [facility];  Service: Endoscopy;  Laterality: N/A;  10:30   DILATION AND CURETTAGE OF UTERUS     FOOT BONE EXCISION     right   POLYPECTOMY  10/04/2016   Procedure: POLYPECTOMY;  Surgeon: [name], MD;  [facility]: [facility];  Service: Endoscopy;;  colon   RIGHT/LEFT HEART CATH AND CORONARY ANGIOGRAPHY N/A 03/23/2019   Procedure: RIGHT/LEFT HEART CATH AND CORONARY ANGIOGRAPHY;  Surgeon: [name], MD;  Location: [facility];  Service: Cardiovascular;  Laterality: N/A;    Social History   Socioeconomic History   Marital status: Divorced    Spouse name: Not on file   Number of children: Not on file   Years of education: Not on file   Highest education level: Not on file  Occupational History   Not on file  Tobacco Use   Smoking  status: Every Day    Packs/day: 0.25    Years: 37.00    Pack years: 9.25    Types: Cigarettes   Smokeless tobacco: Never   Tobacco comments:    1-2 cigarettes daily  Vaping Use   Vaping Use: Never used  Substance and Sexual Activity   Alcohol use: No   Drug use: No   Sexual activity: Yes    Birth control/protection: None  Other Topics Concern   Not on file  Social History Narrative   Not on file   Social Determinants of Health   Financial Resource Strain: Not on file  Food Insecurity: Not on file  Transportation Needs: Not on file  Physical Activity: Not on file  Stress: Not on file  Social Connections: Not on file    Family History  Problem Relation Age of Onset   Arthritis Mother    Hernia Mother    Constipation Mother    Heart failure Father        Underwent heart transplant   Colon cancer Neg Hx     Outpatient Encounter  Medications as of 02/06/2021  Medication Sig   [facility]k FastClix Lancets MISC Use to check glucose twice daily with meals   ACCU-CHEK GUIDE test strip Use as instructed to monitor glucose twice daily   acetaminophen (TYLENOL 8 HOUR) 650 MG CR tablet Take 1 tablet (650 mg total) by mouth every 8 (eight) hours as needed for pain or fever.   aspirin EC 81 MG tablet Take 81 mg by mouth daily. Swallow whole.   atorvastatin (LIPITOR) 40 MG tablet TAKE ONE (1) TABLET BY MOUTH EVERY DAY   buPROPion (WELLBUTRIN SR) 150 MG 12 hr tablet Take 150 mg by mouth 2 (two) times daily.   busPIRone (BUSPAR) 7.5 MG tablet Take 7.5 mg by mouth 2 (two) times daily.   carvedilol (COREG) 6.25 MG tablet TAKE 1.5 TABLETS BY MOUTH TWICE A DAY WITH A MEAL   cetirizine (ZYRTEC) 10 MG tablet Take 10 mg by mouth daily.   Cholecalciferol (VITAMIN D3) 125 MCG (5000 UT) CAPS Take 1 capsule by mouth daily.   ciprofloxacin (CIPRO) 500 MG tablet Take 500 mg by mouth 2 (two) times daily. As needed for flare up   diclofenac Sodium (VOLTAREN) 1 % GEL Apply 1 application topically 4  (four) times daily.   FARXIGA 10 MG TABS tablet TAKE ONE TABLET (10MG  TOTAL) BY MOUTH DAILY   fenofibrate (TRICOR) 48 MG tablet Take 1 tablet (48 mg total) by mouth daily.   FLUoxetine (PROZAC) 40 MG capsule Take 40 mg by mouth daily.   fluticasone (FLONASE) 50 MCG/ACT nasal spray Place 2 sprays into both nostrils daily.   loperamide (IMODIUM) 2 MG capsule TAKE 2 CAPSULES BY MOUTH INITIALLY, THEN TAKE 1 CAPSULE AFTER EACH LOOSE STOOL. DO NOT EXCEED 8 CAPSULES IN 24 HOURS.   melatonin 5 MG TABS Take by mouth.   metroNIDAZOLE (FLAGYL) 500 MG tablet Take 500 mg by mouth 2 (two) times daily. As needed for flare up   pantoprazole (PROTONIX) 40 MG tablet Take 1 tablet (40 mg total) by mouth daily.   sacubitril-valsartan (ENTRESTO) 24-26 MG Take 1 tablet by mouth 2 (two) times daily.   spironolactone (ALDACTONE) 25 MG tablet TAKE ONE TABLET (25MG  TOTAL) BY MOUTH DAILY   zolpidem (AMBIEN) 10 MG tablet Take 10 mg by mouth at bedtime as needed for sleep.   [DISCONTINUED] glipiZIDE (GLUCOTROL) 5 MG tablet Take 5 mg by mouth daily with breakfast.   glipiZIDE (GLUCOTROL) 5 MG tablet Take 1 tablet (5 mg total) by mouth daily before breakfast.   [DISCONTINUED] HM LORATADINE 10 MG tablet Take 10 mg by mouth daily. (Patient not taking: Reported on 02/06/2021)   [DISCONTINUED] melatonin 3 MG TABS tablet Take 3 mg by mouth at bedtime. (Patient not taking: Reported on 02/06/2021)   No facility-administered encounter medications on file as of 02/06/2021.    ALLERGIES: Allergies  Allergen Reactions   Metformin     Causes GI issues    VACCINATION STATUS:  There is no immunization history on file for this patient.  Diabetes She presents for her follow-up diabetic visit. She has type 2 diabetes mellitus. Onset time: She was diagnosed at approximate age of 52 years. Her disease course has been stable. There are no hypoglycemic associated symptoms. Pertinent negatives for hypoglycemia include no confusion,  headaches, pallor or seizures. Associated symptoms include fatigue. Pertinent negatives for diabetes include no chest pain, no polydipsia, no polyphagia and no polyuria. There are no hypoglycemic complications. Symptoms are stable. Diabetic complications include heart disease and nephropathy. Risk  factors for coronary artery disease include dyslipidemia, diabetes mellitus, obesity, sedentary lifestyle, post-menopausal and tobacco exposure. Current diabetic treatment includes oral agent (dual therapy). She is compliant with treatment most of the time. Her weight is fluctuating minimally. She is following a generally unhealthy diet. When asked about meal planning, she reported none. She has not had a previous visit with a dietitian. She rarely participates in exercise. Her home blood glucose trend is fluctuating minimally. Her overall blood glucose range is 140-180 mg/dl. (She presents today with her meter, no logs, showing stable glycemic profile overall.  Her POCT A1c today is 6.9%, increasing slightly from last visit of 6.5%.  She attributes the holiday season to this increase as she has indulged in some sweets.  She denies any significant hypoglycemia.  Analysis of her meter shows 7-day average of 151, 14-day average of 143, 30-day average of 146, 90-day average of 149.) An ACE inhibitor/angiotensin II receptor blocker is being taken. Eye exam is current.  Hyperlipidemia This is a chronic problem. The current episode started more than 1 year ago. The problem is uncontrolled. Recent lipid tests were reviewed and are high. Exacerbating diseases include chronic renal disease, diabetes and obesity. Factors aggravating her hyperlipidemia include fatty foods, beta blockers and smoking. Pertinent negatives include no chest pain, myalgias or shortness of breath. Current antihyperlipidemic treatment includes statins and fibric acid derivatives. The current treatment provides mild improvement of lipids. Compliance  problems include adherence to diet, adherence to exercise and psychosocial issues.  Risk factors for coronary artery disease include dyslipidemia, diabetes mellitus, obesity, a sedentary lifestyle and post-menopausal.  Hypertension This is a chronic problem. The current episode started more than 1 year ago. The problem has been resolved since onset. The problem is controlled. Pertinent negatives include no chest pain, headaches, palpitations or shortness of breath. There are no associated agents to hypertension. Risk factors for coronary artery disease include diabetes mellitus, dyslipidemia, obesity, smoking/tobacco exposure, sedentary lifestyle and post-menopausal state. Past treatments include ACE inhibitors, beta blockers, angiotensin blockers and diuretics. The current treatment provides significant improvement. Compliance problems include diet and exercise.  Hypertensive end-organ damage includes kidney disease and heart failure. Identifiable causes of hypertension include chronic renal disease.   Review of systems  Constitutional: + Minimally fluctuating body weight,  current Body mass index is 27.7 kg/m. , no fatigue, no subjective hyperthermia, no subjective hypothermia Eyes: no blurry vision, no xerophthalmia ENT: no sore throat, no nodules palpated in throat, no dysphagia/odynophagia, no hoarseness Cardiovascular: no chest pain, no shortness of breath, no palpitations, no leg swelling Respiratory: no cough, no shortness of breath Gastrointestinal: no nausea/vomiting/diarrhea Musculoskeletal: no muscle/joint aches Skin: no rashes, no hyperemia Neurological: no tremors, no numbness, no tingling, no dizziness Psychiatric: no depression, no anxiety   Objective:    BP (!) 85/53   Pulse 76   Ht 5\' 6"  (1.676 m)   Wt 171 lb 9.6 oz (77.8 kg)   LMP 09/25/2011   BMI 27.70 kg/m   Wt Readings from Last 3 Encounters:  02/06/21 171 lb 9.6 oz (77.8 kg)  02/01/21 171 lb 6.4 oz (77.7 kg)   01/25/21 171 lb 6.4 oz (77.7 kg)    BP Readings from Last 3 Encounters:  02/06/21 (!) 85/53  02/01/21 (!) 98/58  01/25/21 106/68    Physical Exam- Limited  Constitutional:  Body mass index is 27.7 kg/m. , not in acute distress, normal state of mind Eyes:  EOMI, no exophthalmos Neck: Supple Cardiovascular: RRR, no murmurs, rubs,  or gallops, no edema Respiratory: Adequate breathing efforts, no crackles, rales, rhonchi, or wheezing Musculoskeletal: no gross deformities, strength intact in all four extremities, no gross restriction of joint movements Skin:  no rashes, no hyperemia Neurological: no tremor with outstretched hands    CMP ( most recent) CMP     Component Value Date/Time   NA 137 02/01/2021 1120   NA 139 07/15/2019 0825   K 4.6 02/01/2021 1120   CL 103 02/01/2021 1120   CO2 24 02/01/2021 1120   GLUCOSE 139 (H) 02/01/2021 1120   BUN 26 (H) 02/01/2021 1120   BUN 27 (H) 07/15/2019 0825   CREATININE 1.41 (H) 02/01/2021 1120   CREATININE 1.12 (H) 06/05/2019 0710   CALCIUM 9.6 02/01/2021 1120   PROT 6.7 03/20/2019 1658   ALBUMIN 3.5 03/20/2019 1658   AST 17 03/20/2019 1658   ALT 23 03/20/2019 1658   ALKPHOS 104 03/20/2019 1658   BILITOT 0.7 03/20/2019 1658   GFRNONAA 43 (L) 02/01/2021 1120   GFRAA 54 (L) 10/02/2019 1228     Diabetic Labs (most recent): Lab Results  Component Value Date   HGBA1C 6.9 02/06/2021   HGBA1C 6.3 08/04/2020   HGBA1C 6.6 (A) 02/10/2020    Assessment & Plan:   1) Diabetes mellitus with stage 3 chronic kidney disease ([place])  - [name] has currently uncontrolled symptomatic type 2 DM since  60 years of age.  She presents today with her meter, no logs, showing stable glycemic profile overall.  Her POCT A1c today is 6.9%, increasing slightly from last visit of 6.5%.  She attributes the holiday season to this increase as she has indulged in some sweets.  She denies any significant hypoglycemia.  Analysis of her meter shows 7-day  average of 151, 14-day average of 143, 30-day average of 146, 90-day average of 149.  Recent labs reviewed showing slightly worse renal function- has upcoming appt with nephrology soon.  - I had a long discussion with her about the progressive nature of diabetes and the pathology behind its complications. -her diabetes is complicated by CHF, CKD stage 3b , smoking and she remains at a high risk for more acute and chronic complications which include CAD, CVA, CKD, retinopathy, and neuropathy. These are all discussed in detail with her.  - Nutritional counseling repeated at each appointment due to patients tendency to fall back in to old habits.  - The patient admits there is a room for improvement in their diet and drink choices. -  Suggestion is made for the patient to avoid simple carbohydrates from their diet including Cakes, Sweet Desserts / Pastries, Ice Cream, Soda (diet and regular), Sweet Tea, Candies, Chips, Cookies, Sweet Pastries, Store Bought Juices, Alcohol in Excess of 1-2 drinks a day, Artificial Sweeteners, Coffee Creamer, and "Sugar-free" Products. This will help patient to have stable blood glucose profile and potentially avoid unintended weight gain.   - I encouraged the patient to switch to unprocessed or minimally processed complex starch and increased protein intake (animal or plant source), fruits, and vegetables.   - Patient is advised to stick to a routine mealtimes to eat 3 meals a day and avoid unnecessary snacks (to snack only to correct hypoglycemia).  - I have approached her with the following individualized plan to manage her diabetes and patient agrees:   - She did not tolerate Metformin.  -Based on her stable glycemic profile, she is advised to continue her Glipizide to 5 mg po once daily with breakfast  and Farxiga 10 mg po daily under the supervision of her cardiologist.  -She is encouraged to continue monitoring blood glucose twice daily, before breakfast and  before bed, and to call the clinic if she has readings less than 70 or greater than 200 for 3 tests in a row.    - she is not a suitable candidate for incretin therapy either due to her body habitus as well as chronic smoking history, posing risk for pancreatitis.  -Addition of basal insulin would be the safest option if she loses control on subsequent visits.  - Specific targets for  A1c;  LDL, HDL,  and Triglycerides were discussed with the patient.  2) Blood Pressure /Hypertension:  -Her blood pressure is controlled to target- low today.   she is advised to continue her current medications including Entresto 24/26 mg p.o. daily with breakfast, Carvedilol 6.25 mg p.o. twice daily as well as Spironolactone 25 mg p.o. daily. Med changes will be deferred to her cardiologist/nephrologist.  3) Lipids/Hyperlipidemia:    Her most recent lipid panel from 08/03/20 shows uncontrolled LDL of 126 and elevated triglycerides of 240.  She is advised to continue Atorvastatin 40 mg po daily at bedtime and Fenofibrate 48 mg po daily.  Side effects and precautions discussed with her.  4)  Weight/Diet:  Her Body mass index is 27.7 kg/m.  -She is a candidate for some weight loss. I discussed with her the fact that loss of 5 - 10% of her  current body weight will have the most impact on her diabetes management.  Exercise, and detailed carbohydrates information provided  -  detailed on discharge instructions.  5) Chronic Care/Health Maintenance: -she is on ACEI/ARB and Statin medications and  is encouraged to initiate and continue to follow up with Ophthalmology, Dentist, orthopedic surgeon, cardiology, podiatrist at least yearly or according to recommendations, and advised to  quit smoking. I have recommended yearly flu vaccine and pneumonia vaccine at least every 5 years; moderate intensity exercise for up to 150 minutes weekly; and  sleep for at least 7 hours a day.   The patient was counseled on the dangers of  tobacco use, and was advised to quit.  Reviewed strategies to maximize success, including removing cigarettes and smoking materials from environment.   - she is advised to maintain close follow up with [place], Tyler Deis, MD for primary care needs, as well as her other providers for optimal and coordinated care.     I spent 30 minutes in the care of the patient today including review of labs from [facility], Lipids, Thyroid Function, Hematology (current and previous including abstractions from other facilities); face-to-face time discussing  her blood glucose readings/logs, discussing hypoglycemia and hyperglycemia episodes and symptoms, medications doses, her options of short and long term treatment based on the latest standards of care / guidelines;  discussion about incorporating lifestyle medicine;  and documenting the encounter.    Please refer to Patient Instructions for Blood Glucose Monitoring and Insulin/Medications Dosing Guide"  in media tab for additional information. Please  also refer to " Patient Self Inventory" in the Media  tab for reviewed elements of pertinent patient history.  Orbie Pyo participated in the discussions, expressed understanding, and voiced agreement with the above plans.  All questions were answered to her satisfaction. she is encouraged to contact clinic should she have any questions or concerns prior to her return visit.     Follow up plan: - Return in about 6 months (around  08/07/2021) for Diabetes F/U with A1c in office, Bring meter and logs, No previsit labs.  Rayetta Pigg, [facility] [address] [place], [place] [address] Phone: [phone] Fax: [phone]  02/06/2021, 10:22 AM

## 2021-02-06 NOTE — Patient Instructions (Signed)

## 2021-03-03 ENCOUNTER — Other Ambulatory Visit (HOSPITAL_COMMUNITY): Payer: Self-pay | Admitting: Cardiology

## 2021-03-16 ENCOUNTER — Other Ambulatory Visit (HOSPITAL_COMMUNITY): Payer: Self-pay | Admitting: Cardiology

## 2021-03-20 ENCOUNTER — Telehealth: Payer: Self-pay

## 2021-03-20 ENCOUNTER — Telehealth: Payer: Self-pay | Admitting: Nurse Practitioner

## 2021-03-20 MED ORDER — ACCU-CHEK GUIDE ME W/DEVICE KIT: PACK | 1 refills | Status: DC

## 2021-03-20 NOTE — Telephone Encounter (Signed)
Pt meter broke and is needing a new rx for one. [employer] [facility].     [place], [place] Phone:  [phone]  Fax:  [phone]

## 2021-03-20 NOTE — Telephone Encounter (Signed)
Requested meter has been sent to the requested pharmacy for the patient.

## 2021-03-21 NOTE — Telephone Encounter (Signed)
Patient checked with [facility] to see if they had her meter ready. They told her they do not have a prescription for anything. Can you check on this because it was sent in yesterday

## 2021-03-21 NOTE — Telephone Encounter (Signed)
[facility] and they stated that the patient picked up a new meter in August and if this one broke then she needs to contact the company for them to send her a new meter since her insurance will not pay for another one for 2 years.  I called the patient and left a detailed voice message with these details and for her to contact [employer] for any meter issues.

## 2021-04-05 ENCOUNTER — Other Ambulatory Visit (HOSPITAL_COMMUNITY): Payer: Self-pay | Admitting: Cardiology

## 2021-05-22 ENCOUNTER — Other Ambulatory Visit (HOSPITAL_COMMUNITY): Payer: Self-pay

## 2021-05-23 ENCOUNTER — Other Ambulatory Visit (HOSPITAL_COMMUNITY): Payer: Self-pay

## 2021-05-23 ENCOUNTER — Telehealth (HOSPITAL_COMMUNITY): Payer: Self-pay | Admitting: Pharmacy Technician

## 2021-05-23 NOTE — Telephone Encounter (Signed)
Advanced Heart Failure Patient Advocate Encounter ? ?Prior Authorization for Delene Loll has been submitted and approved.   ? ?PA#  [id number] ?Effective dates: 05/23/21 through 05/23/22 ? ?Charlann Boxer, CPhT ? ? ?

## 2021-07-10 ENCOUNTER — Other Ambulatory Visit (HOSPITAL_COMMUNITY): Payer: Self-pay | Admitting: Family Medicine

## 2021-07-10 DIAGNOSIS — Z1231 Encounter for screening mammogram for malignant neoplasm of breast: Secondary | ICD-10-CM

## 2021-07-11 ENCOUNTER — Other Ambulatory Visit (HOSPITAL_COMMUNITY): Payer: Self-pay | Admitting: Cardiology

## 2021-07-26 ENCOUNTER — Encounter: Payer: Self-pay | Admitting: Vascular Surgery

## 2021-07-26 ENCOUNTER — Ambulatory Visit (INDEPENDENT_AMBULATORY_CARE_PROVIDER_SITE_OTHER): Payer: Medicaid Other | Admitting: Vascular Surgery

## 2021-07-26 ENCOUNTER — Ambulatory Visit (INDEPENDENT_AMBULATORY_CARE_PROVIDER_SITE_OTHER): Payer: Medicaid Other

## 2021-07-26 VITALS — BP 104/68 | HR 73 | Temp 97.9°F | Resp 16 | Ht 67.0 in | Wt 178.0 lb

## 2021-07-26 DIAGNOSIS — H34219 Partial retinal artery occlusion, unspecified eye: Secondary | ICD-10-CM | POA: Diagnosis not present

## 2021-07-26 DIAGNOSIS — Z136 Encounter for screening for cardiovascular disorders: Secondary | ICD-10-CM | POA: Diagnosis not present

## 2021-07-26 NOTE — Progress Notes (Signed)
[facility] [place]  Patient name: [name] MRN: [id number] DOB: [birth year] Sex: female  REASON FOR VISIT: Carotid duplex evaluation for possible Hollenhorst plaque  HPI: [name] is a 61 y.o. 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  She reports that this remains stable.  She does report total leg fatigue with walking any distance and when she is pushing her trash cans.  This is not limiting her ability to get around and she is comfortable with this.  She has had no evidence of tissue loss.  She specifically denies any prior history of amaurosis fugax, aphasia, TIA or stroke.  She had been noted to have changes on eye exam with her initial visit with her and we had recommended carotid duplex.  This was done today and she is here for discussion of this.  Past Medical History:  Diagnosis Date   Anal fissure    Anxiety    Chronic systolic (congestive) heart failure (HCC)    a. 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11   Mixed hyperlipidemia    Nonischemic cardiomyopathy (HCC)    Type 2 diabetes mellitus (HCC)     Family History  Problem Relation Age of Onset   Arthritis Mother    Hernia Mother    Constipation Mother    Heart failure Father        Underwent heart transplant   Colon cancer Neg Hx     SOCIAL HISTORY: Social History   Tobacco Use   Smoking status: Every Day    Packs/day: 0.25    Years: 37.00    Pack years: 9.25    Types: Cigarettes   Smokeless tobacco: Never   Tobacco comments:    1-2 cigarettes daily  Substance Use Topics   Alcohol use: No    Allergies  Allergen Reactions   Metformin     Causes GI issues    Current Outpatient Medications   Medication Sig Dispense Refill   Accu-Chek FastClix Lancets MISC Use to check glucose twice daily with meals 204 each 2   ACCU-CHEK GUIDE test strip Use as instructed to monitor glucose twice daily 400 each 3   acetaminophen (TYLENOL 8 HOUR) 650 MG CR tablet Take 1 tablet (650 mg total) by mouth every 8 (eight) hours as needed for pain or fever. 20 tablet 0   aspirin EC 81 MG tablet Take 81 mg by mouth daily. Swallow whole.     atorvastatin (LIPITOR) 40 MG tablet TAKE ONE (1) TABLET BY MOUTH EVERY DAY 30 tablet 3   Blood Glucose Monitoring Suppl (ACCU-CHEK GUIDE ME) w/Device KIT Use to monitor blood glucose twice daily, before breakfast and before bed. 1 kit 1   buPROPion (WELLBUTRIN SR) 150 MG 12 hr tablet Take 150 mg by mouth 2 (two) times daily.     busPIRone (BUSPAR) 7.5 MG tablet Take 7.5 mg by mouth 2 (two) times daily.     carvedilol (COREG) 6.25 MG tablet TAKE 1.5 TABLETS BY MOUTH TWICE A DAY WITH A MEAL 270 tablet 3   cetirizine (ZYRTEC) 10 MG tablet Take 10 mg by mouth daily.     Cholecalciferol (VITAMIN D3) 125 MCG (5000 UT) CAPS Take  1 capsule by mouth daily.     ciprofloxacin (CIPRO) 500 MG tablet Take 500 mg by mouth 2 (two) times daily. As needed for flare up     diclofenac Sodium (VOLTAREN) 1 % GEL Apply 1 application topically 4 (four) times daily.     ENTRESTO 24-26 MG TAKE ONE TABLET BY MOUTH TWICE A DAY 60 tablet 11   FARXIGA 10 MG TABS tablet TAKE ONE TABLET ($RemoveBef'10MG'AwSmEDIXCW$  TOTAL) BY MOUTH DAILY 30 tablet 11   fenofibrate (TRICOR) 48 MG tablet Take 1 tablet (48 mg total) by mouth daily. 90 tablet 3   FLUoxetine (PROZAC) 40 MG capsule Take 40 mg by mouth daily.     fluticasone (FLONASE) 50 MCG/ACT nasal spray Place 2 sprays into both nostrils daily.     glipiZIDE (GLUCOTROL) 5 MG tablet Take 1 tablet (5 mg total) by mouth daily before breakfast. 90 tablet 3   loperamide (IMODIUM) 2 MG capsule TAKE 2 CAPSULES BY MOUTH INITIALLY, THEN TAKE 1 CAPSULE AFTER EACH LOOSE STOOL. DO NOT EXCEED  8 CAPSULES IN 24 HOURS.     melatonin 5 MG TABS Take by mouth.     metroNIDAZOLE (FLAGYL) 500 MG tablet Take 500 mg by mouth 2 (two) times daily. As needed for flare up     pantoprazole (PROTONIX) 40 MG tablet Take 1 tablet (40 mg total) by mouth daily. 30 tablet 0   spironolactone (ALDACTONE) 25 MG tablet TAKE ONE TABLET ($RemoveBef'25MG'zUYbLCqtem$  TOTAL) BY MOUTH DAILY 90 tablet 3   zolpidem (AMBIEN) 10 MG tablet Take 10 mg by mouth at bedtime as needed for sleep.     No current facility-administered medications for this visit.    REVIEW OF SYSTEMS:  $RemoveB'[X]'BRKnOWbt$  denotes positive finding, $RemoveBeforeDEI'[ ]'fDzuLovLOxUStLxi$  denotes negative finding Cardiac  Comments:  Chest pain or chest pressure:    Shortness of breath upon exertion:    Short of breath when lying flat:    Irregular heart rhythm:        Vascular    Pain in calf, thigh, or hip brought on by ambulation: x   Pain in feet at night that wakes you up from your sleep:     Blood clot in your veins:    Leg swelling:           PHYSICAL EXAM: Vitals:   07/26/21 0926 07/26/21 0928  BP: (!) 84/58 104/68  Pulse: 73   Resp: 16   Temp: 97.9 F (36.6 C)   TempSrc: Temporal   SpO2: 96%   Weight: 178 lb 0.4 oz (80.8 kg)   Height: $Remove'5\' 7"'BVLFgiG$  (1.702 m)     GENERAL: The patient is a well-nourished female, in no acute distress. The vital signs are documented above. CARDIOVASCULAR: Carotid arteries without bruits bilaterally.  2+ radial pulses bilaterally. PULMONARY: There is good air exchange  MUSCULOSKELETAL: There are no major deformities or cyanosis. NEUROLOGIC: No focal weakness or paresthesias are detected. SKIN: There are no ulcers or rashes noted. PSYCHIATRIC: The patient has a normal affect.  DATA:  Carotid duplex today reveals no evidence of significant stenosis with predicted 1 to 39% narrowing bilaterally internal carotid arteries.  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  Explained the potential treatment should this  worsen.  I explained that it is completely safe to continue tolerating this level of claudication.  She will notify us if this progresses or if she has any tissue loss  Also reassured her that there was no  evidence of extracranial cerebrovascular occlusive disease and would not recommend any further follow-up    Rosetta Posner, MD FACS [facility] [place] Office Tel [phone]  Note: Portions of this report may have been transcribed using voice recognition software.  Every effort has been made to ensure accuracy; however, inadvertent computerized transcription errors may still be present.

## 2021-07-27 ENCOUNTER — Other Ambulatory Visit (HOSPITAL_COMMUNITY): Payer: Self-pay | Admitting: Cardiology

## 2021-07-27 ENCOUNTER — Encounter: Payer: Self-pay | Admitting: Vascular Surgery

## 2021-07-31 ENCOUNTER — Encounter (HOSPITAL_COMMUNITY): Payer: Self-pay | Admitting: Cardiology

## 2021-08-07 ENCOUNTER — Ambulatory Visit: Payer: Medicaid Other | Admitting: Nurse Practitioner

## 2021-08-08 ENCOUNTER — Other Ambulatory Visit: Payer: Self-pay | Admitting: Nurse Practitioner

## 2021-08-14 ENCOUNTER — Ambulatory Visit (HOSPITAL_COMMUNITY)
Admission: RE | Admit: 2021-08-14 | Discharge: 2021-08-14 | Disposition: A | Discharge status: Home / Self Care | Payer: Medicaid Other | Source: Ambulatory Visit | Attending: Family Medicine | Admitting: Family Medicine

## 2021-08-14 DIAGNOSIS — Z1231 Encounter for screening mammogram for malignant neoplasm of breast: Secondary | ICD-10-CM | POA: Insufficient documentation

## 2021-08-22 ENCOUNTER — Encounter: Payer: Self-pay | Admitting: Nurse Practitioner

## 2021-08-22 ENCOUNTER — Ambulatory Visit: Payer: Medicaid Other | Admitting: Nurse Practitioner

## 2021-08-22 VITALS — BP 94/63 | HR 73 | Ht 67.0 in | Wt 175.0 lb

## 2021-08-22 DIAGNOSIS — I10 Essential (primary) hypertension: Secondary | ICD-10-CM

## 2021-08-22 DIAGNOSIS — E1122 Type 2 diabetes mellitus with diabetic chronic kidney disease: Secondary | ICD-10-CM | POA: Diagnosis not present

## 2021-08-22 DIAGNOSIS — N1832 Chronic kidney disease, stage 3b: Secondary | ICD-10-CM

## 2021-08-22 DIAGNOSIS — F17200 Nicotine dependence, unspecified, uncomplicated: Secondary | ICD-10-CM

## 2021-08-22 DIAGNOSIS — E782 Mixed hyperlipidemia: Secondary | ICD-10-CM

## 2021-08-22 LAB — POCT GLYCOSYLATED HEMOGLOBIN (HGB A1C): HbA1c POC (<> result, manual entry): 7.4 % (ref 4.0–5.6)

## 2021-08-22 MED ORDER — ACCU-CHEK GUIDE VI STRP: ORAL_STRIP | 3 refills | Status: DC

## 2021-08-22 MED ORDER — ACCU-CHEK FASTCLIX LANCETS MISC: 2 refills | Status: DC

## 2021-08-22 MED ORDER — GLIPIZIDE 5 MG PO TABS: 2.5000 mg | ORAL_TABLET | Freq: Every day | ORAL | 3 refills | Status: DC

## 2021-09-15 ENCOUNTER — Encounter (INDEPENDENT_AMBULATORY_CARE_PROVIDER_SITE_OTHER): Payer: Self-pay | Admitting: *Deleted

## 2021-09-26 ENCOUNTER — Encounter (INDEPENDENT_AMBULATORY_CARE_PROVIDER_SITE_OTHER): Payer: Self-pay | Admitting: *Deleted

## 2021-09-29 ENCOUNTER — Ambulatory Visit (HOSPITAL_COMMUNITY)
Admission: RE | Admit: 2021-09-29 | Discharge: 2021-09-29 | Disposition: A | Discharge status: Home / Self Care | Payer: Medicaid Other | Source: Ambulatory Visit | Attending: Cardiology | Admitting: Cardiology

## 2021-09-29 ENCOUNTER — Encounter (HOSPITAL_COMMUNITY): Payer: Self-pay | Admitting: Cardiology

## 2021-09-29 VITALS — BP 104/60 | HR 73 | Wt 173.8 lb

## 2021-09-29 DIAGNOSIS — E1151 Type 2 diabetes mellitus with diabetic peripheral angiopathy without gangrene: Secondary | ICD-10-CM | POA: Diagnosis not present

## 2021-09-29 DIAGNOSIS — E1122 Type 2 diabetes mellitus with diabetic chronic kidney disease: Secondary | ICD-10-CM | POA: Diagnosis present

## 2021-09-29 DIAGNOSIS — Z8249 Family history of ischemic heart disease and other diseases of the circulatory system: Secondary | ICD-10-CM | POA: Diagnosis not present

## 2021-09-29 DIAGNOSIS — I5022 Chronic systolic (congestive) heart failure: Secondary | ICD-10-CM | POA: Diagnosis not present

## 2021-09-29 DIAGNOSIS — I2510 Atherosclerotic heart disease of native coronary artery without angina pectoris: Secondary | ICD-10-CM | POA: Insufficient documentation

## 2021-09-29 DIAGNOSIS — E785 Hyperlipidemia, unspecified: Secondary | ICD-10-CM | POA: Diagnosis present

## 2021-09-29 DIAGNOSIS — I130 Hypertensive heart and chronic kidney disease with heart failure and stage 1 through stage 4 chronic kidney disease, or unspecified chronic kidney disease: Secondary | ICD-10-CM | POA: Diagnosis present

## 2021-09-29 DIAGNOSIS — F17210 Nicotine dependence, cigarettes, uncomplicated: Secondary | ICD-10-CM | POA: Diagnosis not present

## 2021-09-29 DIAGNOSIS — Z720 Tobacco use: Secondary | ICD-10-CM | POA: Diagnosis not present

## 2021-09-29 DIAGNOSIS — E782 Mixed hyperlipidemia: Secondary | ICD-10-CM

## 2021-09-29 DIAGNOSIS — Z79899 Other long term (current) drug therapy: Secondary | ICD-10-CM | POA: Insufficient documentation

## 2021-09-29 DIAGNOSIS — Z20822 Contact with and (suspected) exposure to covid-19: Secondary | ICD-10-CM | POA: Diagnosis not present

## 2021-09-29 DIAGNOSIS — J441 Chronic obstructive pulmonary disease with (acute) exacerbation: Secondary | ICD-10-CM | POA: Insufficient documentation

## 2021-09-29 DIAGNOSIS — I6523 Occlusion and stenosis of bilateral carotid arteries: Secondary | ICD-10-CM | POA: Insufficient documentation

## 2021-09-29 DIAGNOSIS — I428 Other cardiomyopathies: Secondary | ICD-10-CM | POA: Diagnosis not present

## 2021-09-29 LAB — LIPID PANEL
Cholesterol: 151 mg/dL (ref 0–200)
HDL: 35 mg/dL — ABNORMAL LOW (ref >40)
LDL Cholesterol: UNDETERMINED mg/dL (ref 0–99)
Total CHOL/HDL Ratio: 4.3 RATIO
Triglycerides: 442 mg/dL — ABNORMAL HIGH (ref <150)
VLDL: UNDETERMINED mg/dL (ref 0–40)

## 2021-09-29 LAB — COMPREHENSIVE METABOLIC PANEL
ALT: 16 U/L (ref 0–44)
AST: 17 U/L (ref 15–41)
Albumin: 4.2 g/dL (ref 3.5–5.0)
Alkaline Phosphatase: 78 U/L (ref 38–126)
Anion gap: 12 (ref 5–15)
BUN: 32 mg/dL — ABNORMAL HIGH (ref 6–20)
CO2: 19 mmol/L — ABNORMAL LOW (ref 22–32)
Calcium: 9.6 mg/dL (ref 8.9–10.3)
Chloride: 105 mmol/L (ref 98–111)
Creatinine, Ser: 1.67 mg/dL — ABNORMAL HIGH (ref 0.44–1.00)
GFR, Estimated: 35 mL/min — ABNORMAL LOW (ref >60)
Glucose, Bld: 199 mg/dL — ABNORMAL HIGH (ref 70–99)
Potassium: 4.5 mmol/L (ref 3.5–5.1)
Sodium: 136 mmol/L (ref 135–145)
Total Bilirubin: 0.5 mg/dL (ref 0.3–1.2)
Total Protein: 6.5 g/dL (ref 6.5–8.1)

## 2021-09-29 LAB — LDL CHOLESTEROL, DIRECT: Direct LDL: 80 mg/dL (ref 0–99)

## 2021-09-29 MED ORDER — CARVEDILOL 6.25 MG PO TABS: 6.2500 mg | ORAL_TABLET | Freq: Two times a day (BID) | ORAL | 3 refills | Status: DC

## 2021-09-29 MED ORDER — CHANTIX STARTING MONTH PAK 0.5 MG X 11 & 1 MG X 42 PO TBPK: 0.5000 mg | ORAL_TABLET | Freq: Every day | ORAL | 1 refills | Status: DC

## 2021-09-29 MED ORDER — SPIRONOLACTONE 25 MG PO TABS: ORAL_TABLET | ORAL | 3 refills | Status: DC

## 2021-09-29 NOTE — Patient Instructions (Signed)
Start Chantix as directed on box.  Decrease your carvedilol to 6.'25mg'$  Twice daily  Labs done today, your results will be available in MyChart, we will contact you for abnormal readings.  Your physician recommends that you schedule a follow-up appointment in: 6 months ( February 2024)  ** please call the office in December 2023 to arrange your follow up appointment **  If you have any questions or concerns before your next appointment please send [place] a message through [place] or call our office at [phone].    TO LEAVE A MESSAGE FOR THE NURSE SELECT OPTION 2, PLEASE LEAVE A MESSAGE INCLUDING: YOUR NAME DATE OF BIRTH CALL BACK NUMBER REASON FOR CALL**this is important as we prioritize the call backs  YOU WILL RECEIVE A CALL BACK THE SAME DAY AS LONG AS YOU CALL BEFORE 4:00 PM  At the [facility], you and your health needs are our priority. As part of our continuing mission to provide you with exceptional heart care, we have created designated Provider Care Teams. These Care Teams include your primary Cardiologist (physician) and Advanced Practice Providers (APPs- Physician Assistants and Nurse Practitioners) who all work together to provide you with the care you need, when you need it.   You may see any of the following providers on your designated Care Team at your next follow up: Dr Glori Bickers Dr Haynes Kerns, NP Lyda Jester, [place] [facility] [place], [place] Audry Riles, [name]   Please be sure to bring in all your medications bottles to every appointment.

## 2021-09-29 NOTE — Progress Notes (Incomplete)
Advanced Heart Failure Clinic Note   PCP: Wilburt Finlay, MD PCP-Cardiologist: Rozann Lesches, MD  HF Cardiology: Dr. Aundra Dubin  Vascular: Dr Early   HPI: 61 y.o. female w/ history of DM, HTN, hyperlipidemia, and smoking.  She was admitted on 1/22 for about 1 week of shortness of breath. She had been started on azithromycin and prednisone by PCP and was taking inhalers for ?COPD exacerbation without relief.  COVID negative.  Dyspnea primarily exertional, no orthopnea. No chest pain reported.  In ER at [facility], pro-BNP was elevated and CXR suggestive of volume overload. Slightly elevated HS-TnI with no trend, not suggestive of ACS.  She was admitted for management of new diagnosis of CHF and seen by Dr. Domenic Polite.  Echo was done, showed EF 20-25% with mild RV dysfunction. She was started on IV Lasix for diuresed then transitioned to PO.  She was transferred from [facility] to [facility] for RHC/LHC to assess filling pressures/CO and also to assess for coronary disease as etiology.    Of note, her father developed CHF in his 65s, nonischemic cardiomyopathy.  He had ICD and later heart transplant.  Grandmother also had CHF.   Cath was done 1/25 and showed no significant coronary disease and optimized filling pressures with preserved cardiac output (see angiographic and hemodynamic data below). Diagnosed w/ NICM. cMRI showed no myocardial LGE, so no definitive evidence for prior MI, infiltrative disease, or cardiomyopathy. RV normal w/ EF 35%. She was started on GDMT w/ ARB, ? blocker and MRA + loop diuretic.   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 Broadus John, thought to be probably be a benign variant.   Echo 5/21 with EF 30-35% (closer to 35%), diffuse hypokinesis, mildly decreased RV systolic function, normal IVC. Echo in 11/21 showed EF 45%, diffuse hypokinesis, mildly decreased RV systolic function.  Echo in 11/22 showed stable EF 45-50%.   2022 ABI - Noninvasive  test from [facility] were reviewed.  This revealed ankle arm index of 0.68 on the right and 1.0 on the left  Carotid duplex 05/23: 1-39% stenosis in b/l carotid arteries based on velocity criteria  Here today for follow-up. She denies any CP, shortness of breath orthopnea or PND. No lower extremity edema. Weight is up from mid 160s last winter to low to mid 170s. Attributes weight gain to caloric intake rather than fluid retention.   Her blood pressure is typically in 62M-355H systolic, occasionally down to 80s. May feel "off balance" at times but no presyncope or syncope.   Notes right leg claudication when walking in grocery store or attempting to haul trash to the curb. No ischemic rest pain.  Smoking 1 pack every 3 days. On wellbutrin but doesn't feel it is working. Feels chantix worked better for her.  PMH: 1. Hyperlipidemia 2. Type 2 diabetes.  3. Chronic systolic CHF:  - Echo (7/41).  EF 20-25%, mildly decreased RV function.  - RHC/LHC (1/21): Nonobstructive CAD; mean RA 9, PA 28/5, mean PCWP 9, CI 2.37 - Cardiac MRI (1/21): LV EF 31%, RVEF 28%, No myocardial LGE.  - Echo (5/21): EF 30-35% (closer to 35%), diffuse hypokinesis, mildly decreased RV systolic function, normal IVC. 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  - Echo (11/21): EF 45%, diffuse hypokinesis, mildly decreased RV systolic function.  - Echo ( 11/22): EF 45-50%  4. Smoker 5. CKD stage 3  Review of  Systems: All systems reviewed and negative except as per HPI.   Current Outpatient Medications  Medication Sig Dispense Refill   Accu-Chek FastClix Lancets MISC USE TO CHECK TWICE DAILY. 100 each 2   ACCU-CHEK GUIDE test strip Use as instructed to monitor glucose twice daily 400 each 3   acetaminophen (TYLENOL 8 HOUR) 650 MG CR tablet Take 1 tablet (650 mg total) by mouth every 8 (eight) hours as needed for pain or fever. 20 tablet 0    aspirin EC 81 MG tablet Take 81 mg by mouth daily. Swallow whole.     atorvastatin (LIPITOR) 40 MG tablet TAKE ONE (1) TABLET BY MOUTH EVERY DAY 90 tablet 3   Blood Glucose Monitoring Suppl (ACCU-CHEK GUIDE ME) w/Device KIT Use to monitor blood glucose twice daily, before breakfast and before bed. 1 kit 1   buPROPion (WELLBUTRIN SR) 150 MG 12 hr tablet Take 150 mg by mouth 2 (two) times daily.     busPIRone (BUSPAR) 7.5 MG tablet Take 7.5 mg by mouth 2 (two) times daily.     carvedilol (COREG) 6.25 MG tablet TAKE 1.5 TABLETS BY MOUTH TWICE A DAY WITH A MEAL 270 tablet 3   cetirizine (ZYRTEC) 10 MG tablet Take 10 mg by mouth daily.     Cholecalciferol (VITAMIN D3) 125 MCG (5000 UT) CAPS Take 1 capsule by mouth daily.     diclofenac Sodium (VOLTAREN) 1 % GEL Apply 1 application topically 4 (four) times daily.     ENTRESTO 24-26 MG TAKE ONE TABLET BY MOUTH TWICE A DAY 60 tablet 11   FARXIGA 10 MG TABS tablet TAKE ONE TABLET (10MG  TOTAL) BY MOUTH DAILY 30 tablet 11   fenofibrate (TRICOR) 48 MG tablet Take 1 tablet (48 mg total) by mouth daily. 90 tablet 3   FLUoxetine (PROZAC) 40 MG capsule Take 40 mg by mouth daily.     fluticasone (FLONASE) 50 MCG/ACT nasal spray Place 1 spray into both nostrils as needed for allergies or rhinitis.     glipiZIDE (GLUCOTROL) 5 MG tablet Take 0.5 tablets (2.5 mg total) by mouth daily before breakfast. 90 tablet 3   loperamide (IMODIUM) 2 MG capsule TAKE 2 CAPSULES BY MOUTH INITIALLY, THEN TAKE 1 CAPSULE AFTER EACH LOOSE STOOL. DO NOT EXCEED 8 CAPSULES IN 24 HOURS.     melatonin 5 MG TABS Take by mouth.     metroNIDAZOLE (FLAGYL) 500 MG tablet Take 500 mg by mouth 2 (two) times daily. As needed for flare up     pantoprazole (PROTONIX) 40 MG tablet Take 1 tablet (40 mg total) by mouth daily. 30 tablet 0   spironolactone (ALDACTONE) 25 MG tablet TAKE ONE TABLET (25MG  TOTAL) BY MOUTH DAILY 90 tablet 3   zolpidem (AMBIEN) 10 MG tablet Take 10 mg by mouth at bedtime as  needed for sleep.     No current facility-administered medications for this encounter.    Allergies  Allergen Reactions   Metformin     Causes GI issues      Social History   Socioeconomic History   Marital status: Divorced    Spouse name: Not on file   Number of children: Not on file   Years of education: Not on file   Highest education level: Not on file  Occupational History   Not on file  Tobacco Use   Smoking status: Every Day    Packs/day: 0.25    Years: 37.00    Total pack years: 9.25  Types: Cigarettes   Smokeless tobacco: Never   Tobacco comments:    1-2 cigarettes daily  Vaping Use   Vaping Use: Never used  Substance and Sexual Activity   Alcohol use: No   Drug use: No   Sexual activity: Yes    Birth control/protection: None  Other Topics Concern   Not on file  Social History Narrative   Not on file   Social Determinants of Health   Financial Resource Strain: Unknown (04/30/2018)   Overall Financial Resource Strain (CARDIA)    Difficulty of Paying Living Expenses: Patient refused  Food Insecurity: Unknown (04/30/2018)   Hunger Vital Sign    Worried About Running Out of Food in the Last Year: Patient refused    [place] in the Last Year: Patient refused  Transportation Needs: Unknown (04/30/2018)   PRAPARE - Transportation    Lack of Transportation (Medical): Patient refused    Lack of Transportation (Non-Medical): Patient refused  Physical Activity: Unknown (04/30/2018)   Exercise Vital Sign    Days of Exercise per Week: Patient refused    Minutes of Exercise per Session: Patient refused  Stress: Unknown (04/30/2018)   [place]    Feeling of Stress : Patient refused  Social Connections: Unknown (04/30/2018)   Social Connection and Isolation Panel [NHANES]    Frequency of Communication with Friends and Family: Patient refused    Frequency of Social Gatherings with Friends and  Family: Patient refused    Attends Religious Services: Patient refused    Active Member of Clubs or Organizations: Patient refused    Attends Archivist Meetings: Patient refused    Marital Status: Patient refused  Intimate Partner Violence: Unknown (04/30/2018)   Humiliation, Afraid, Rape, and Kick questionnaire    Fear of Current or Ex-Partner: Patient refused    Emotionally Abused: Patient refused    Physically Abused: Patient refused    Sexually Abused: Patient refused      Family History  Problem Relation Age of Onset   Arthritis Mother    Hernia Mother    Constipation Mother    Heart failure Father        Underwent heart transplant   Colon cancer Neg Hx     Vitals:   09/29/21 1127  BP: 104/60  Pulse: 73  SpO2: 97%  Weight: 78.8 kg (173 lb 12.8 oz)   Wt Readings from Last 3 Encounters:  09/29/21 78.8 kg (173 lb 12.8 oz)  08/22/21 79.4 kg (175 lb)  07/26/21 80.8 kg (178 lb 0.4 oz)    PHYSICAL EXAM: General:  Well appearing. No resp difficulty HEENT: normal Neck: supple. no JVD. Carotids 2+ bilat; no bruits.  Cor: PMI nondisplaced. Regular rate & rhythm. No rubs, gallops or murmurs. Lungs: clear Abdomen: soft, nontender, nondistended.  Extremities: no cyanosis, clubbing, rash, edema Neuro: alert & orientedx3, cranial nerves grossly intact. moves all 4 extremities w/o difficulty. Affect pleasant  ASSESSMENT & PLAN: 1. Chronic Systolic CHF: Echo 2/44 w/ EF 20-25%, mild RV dysfunction.  No prior cardiac history but father has history of nonischemic cardiomyopathy with heart transplant and paternal grandmother also had CHF.  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  LHC/RHC 1/21 showed no significant coronary disease and optimized filling pressures with preserved cardiac output. cMRI showed no myocardial LGE, so no definitive evidence for prior MI, infiltrative disease, or cardiomyopathy.   Echo  5/21 showed mild improvement in LV function, EF 30-35% (probably closer to 35%).  Echo 01/10/2021 EF 45-50%.  - NYHA II. Volume status stable.  - No room to titrate medications.  - Decrease coreg to 6.25 mg BID d/t soft BP - Continue Entresto 24-26 bid.  - Continue spironolactone 25 mg daily.  - Continue dapagliflozin 10 mg daily.  - Check CMET. 2. Smoking: Still smoking a pack every 3 days.  She has failed nicotine patches and wellbutrin.  Will prescribe Chantix now that is available again. - Discussed cessation.  3. HTN: BP now low.  - See med changes above 4. Type 2 diabetes: Sees an endocrinologist.  - Continue Wilder Glade. 5. Hyperlipidemia: Continue statin. - Last LDL 126. Had been off Atorvastatin. Now back on the medication. - Lipid panel today. 6. PAD: Follows with Dr Donnetta Hutching. No plan for intervention unless leg pain is lifestyle limiting or has ischemic rest pain.  Follow-up: 6 months  Miliani Deike N, PA-C 09/29/21   Patient seen with PA, agree with the above note.   Patient denies significant exertional dyspnea or chest pain.  She has claudication in her right calf but no rest pain or ulcers. This has been stable. She still occasionally gets lightheaded with standing and SBP running 80s-100s.   General: NAD Neck: No JVD, no thyromegaly or thyroid nodule.  Lungs: Clear to auscultation bilaterally with normal respiratory effort. CV: Nondisplaced PMI.  Heart regular S1/S2, no S3/S4, no murmur.  No peripheral edema.  No carotid bruit.  Unable to palpate pedal pulses.  Abdomen: Soft, nontender, no hepatosplenomegaly, no distention.  Skin: Intact without lesions or rashes.  Neurologic: Alert and oriented x 3.  Psych: Normal affect. Extremities: No clubbing or cyanosis.  HEENT: Normal.   Stable claudication, follows with Dr. Donnetta Hutching.  No rest pain or ulcers so will continue to manage medically.   Still with occasional orthostatic symptoms.  I will decrease Coreg to 6.25  mg bid.   Check BMET and lipids today.   Very important for her to quit smoking.  I will start her on Chantix.   Loralie Champagne 09/30/2021

## 2021-10-03 ENCOUNTER — Telehealth (HOSPITAL_COMMUNITY): Payer: Self-pay | Admitting: Surgery

## 2021-10-03 DIAGNOSIS — E782 Mixed hyperlipidemia: Secondary | ICD-10-CM

## 2021-10-03 DIAGNOSIS — I5022 Chronic systolic (congestive) heart failure: Secondary | ICD-10-CM

## 2021-10-03 MED ORDER — ATORVASTATIN CALCIUM 80 MG PO TABS: 80.0000 mg | ORAL_TABLET | Freq: Every day | ORAL | 6 refills | Status: DC

## 2021-10-03 MED ORDER — FENOFIBRATE 145 MG PO TABS: 145.0000 mg | ORAL_TABLET | Freq: Every day | ORAL | 6 refills | Status: DC

## 2021-10-03 NOTE — Telephone Encounter (Signed)
-----   Message from [name], MD sent at 10/03/2021  4:08 PM EDT ----- Lipids elevated.  Increase fenofibrate to 150 mg daily.  Increase atorvastatin to 80 mg daily. Lipids/LFTs in 2 months.

## 2021-10-03 NOTE — Telephone Encounter (Signed)
I called patient to review results and recommendations per provider.  She is aware and agreeable to medication changes.  I have updated medlist in [facility] and sent prescriptions to her pharmacy of choice.  She wishes to have labwork collected at appt already scheduled with Dr. Domenic Polite in October.  I have added that to appt notes and ordered in [facility].

## 2021-11-03 ENCOUNTER — Other Ambulatory Visit (HOSPITAL_COMMUNITY): Payer: Self-pay | Admitting: Cardiology

## 2021-11-03 MED ORDER — VARENICLINE TARTRATE 1 MG PO TABS: 1.0000 mg | ORAL_TABLET | Freq: Two times a day (BID) | ORAL | 0 refills | Status: DC

## 2021-11-14 IMAGING — MG DIGITAL SCREENING BILAT W/ TOMO W/ CAD
8 series · 9 of 24 positions shown · non-contrast
Comparison: Previous exam(s).

CLINICAL DATA: Screening.

EXAM:
DIGITAL SCREENING BILATERAL MAMMOGRAM WITH TOMO AND CAD

[R MLO synth-2D]
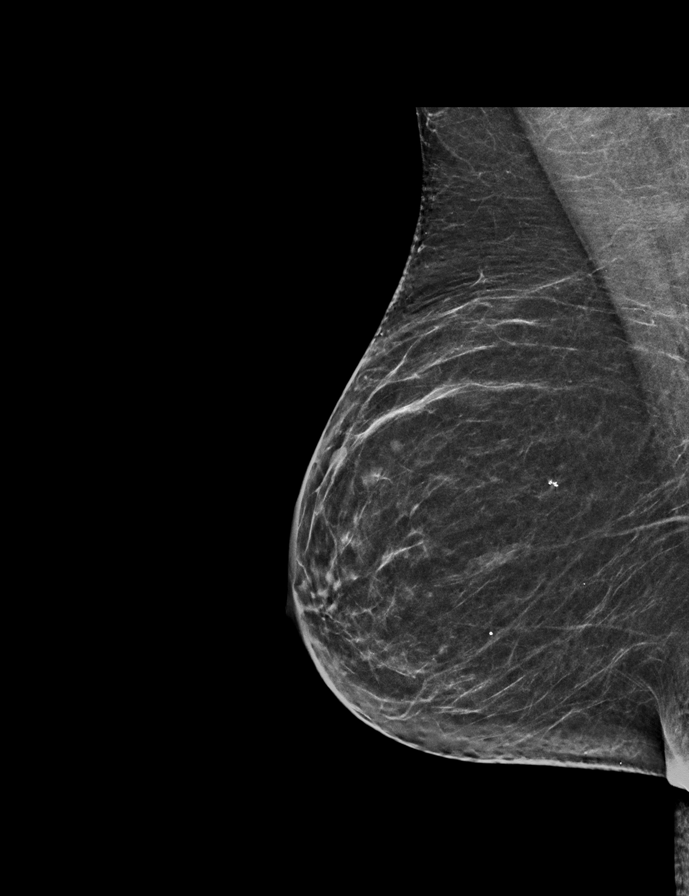

[L CC synth-2D]
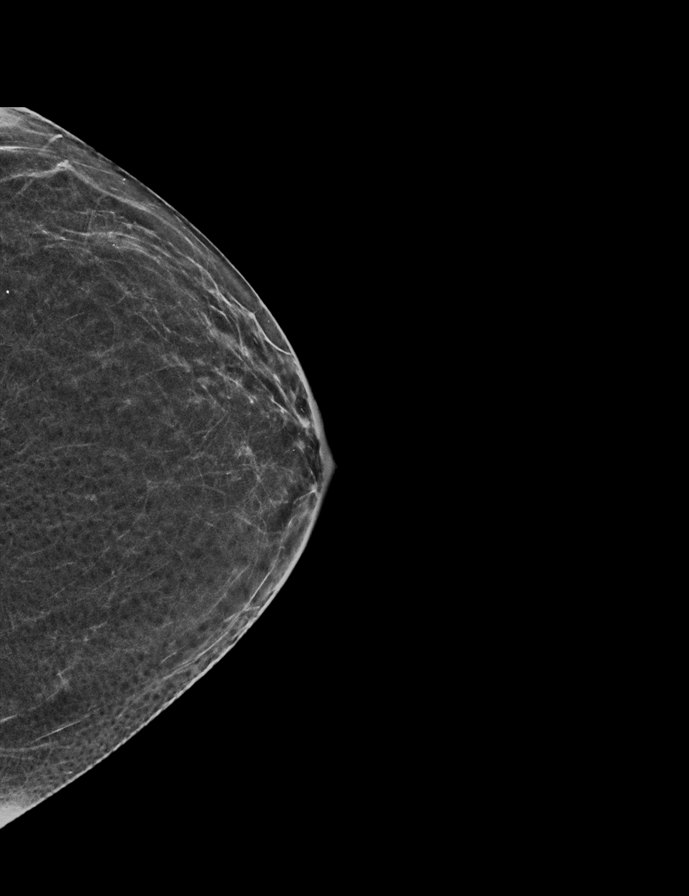

[R CC synth-2D]
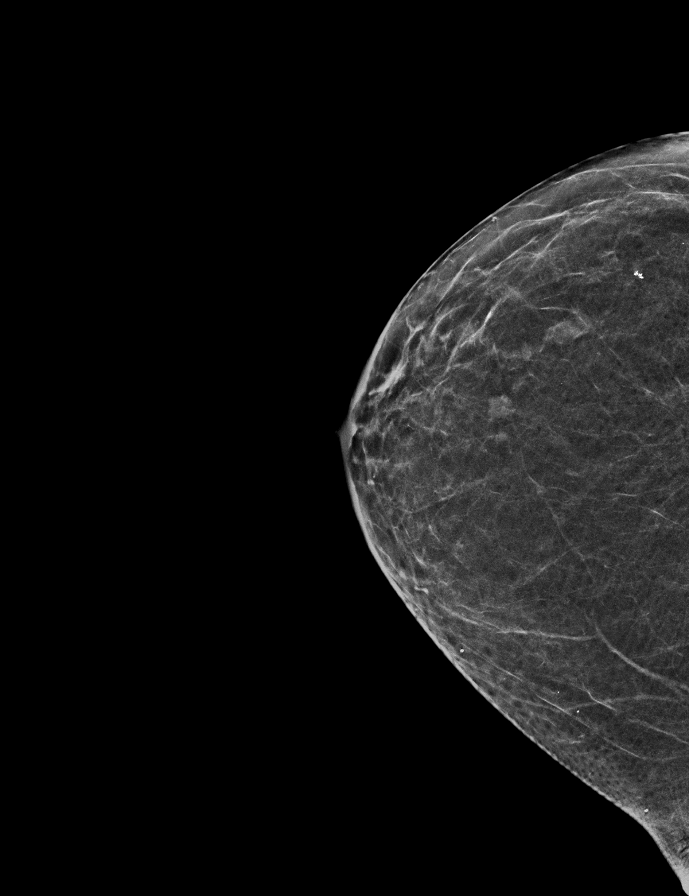

[L MLO synth-2D]
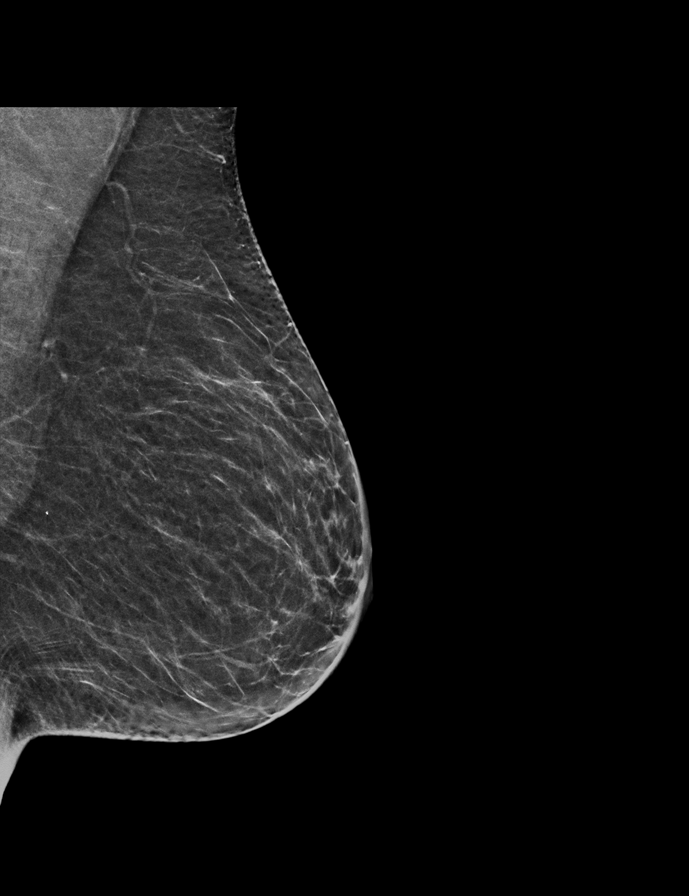

[R MLO tomo · 2 of 54 frames shown]
[frame 18/54]
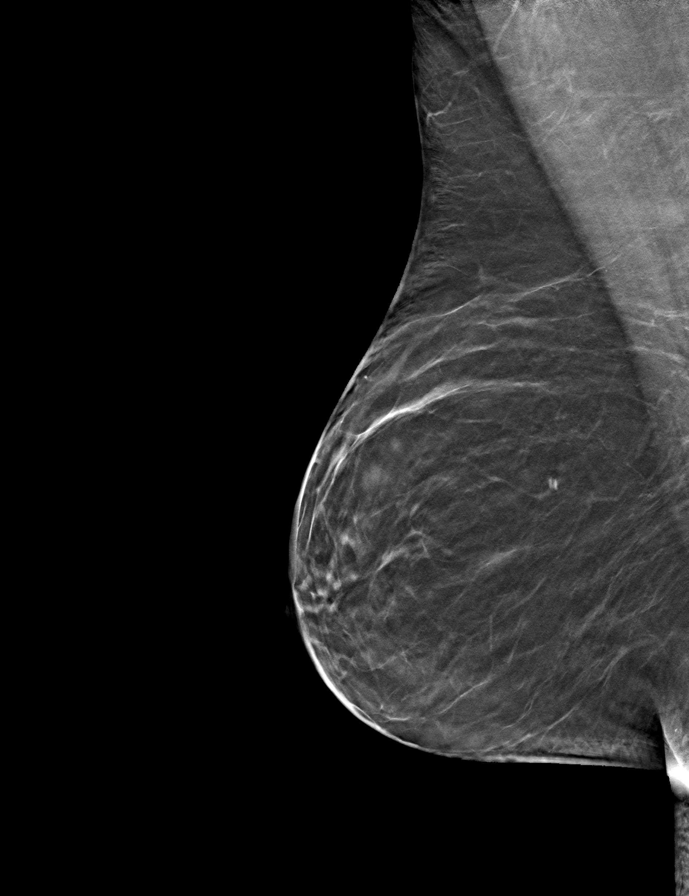
[frame 27/54]
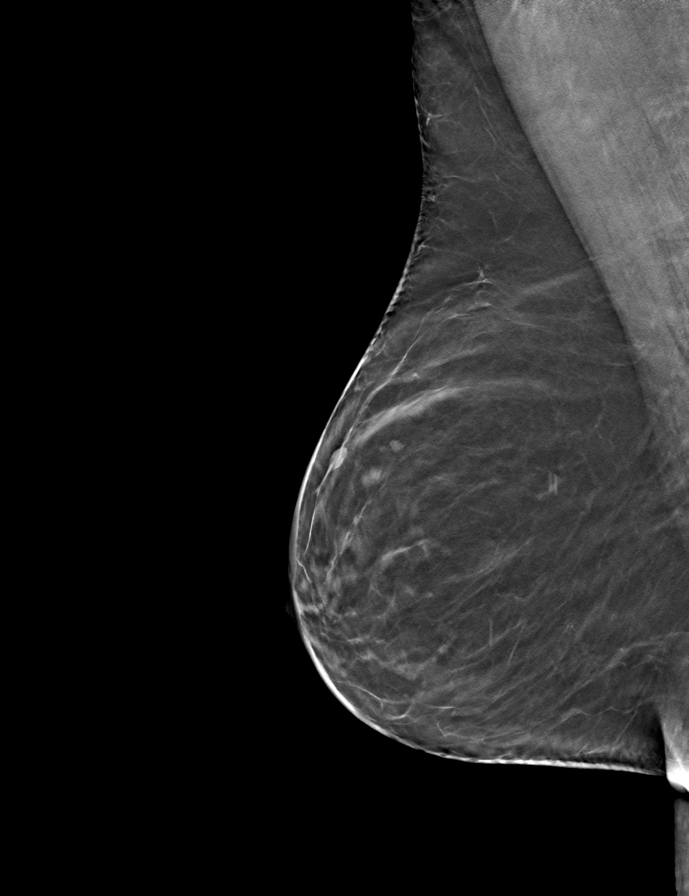

[L MLO tomo · tomo slice 27/54.0]
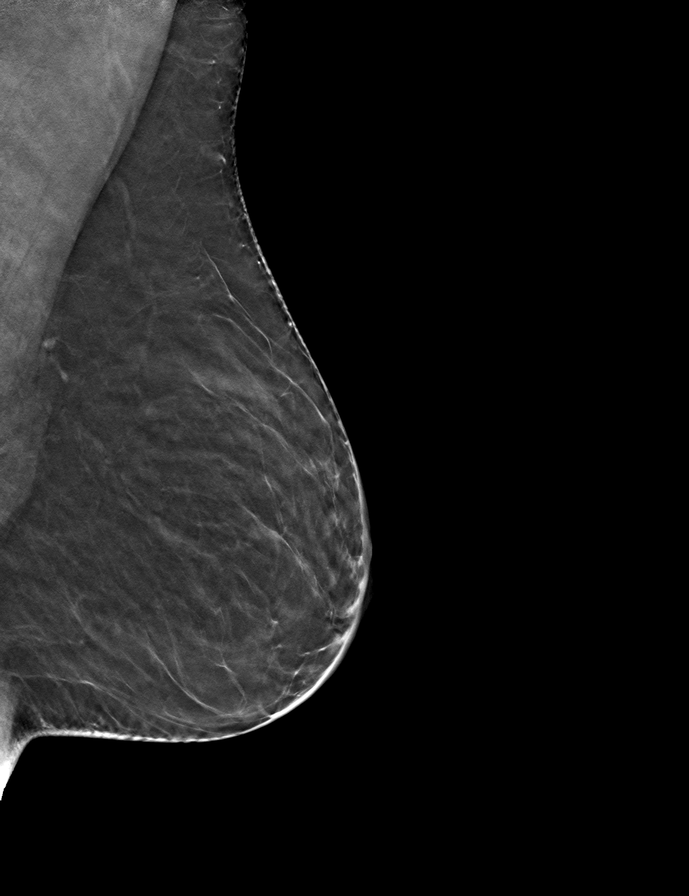

[L CC tomo · tomo slice 23/45.0]
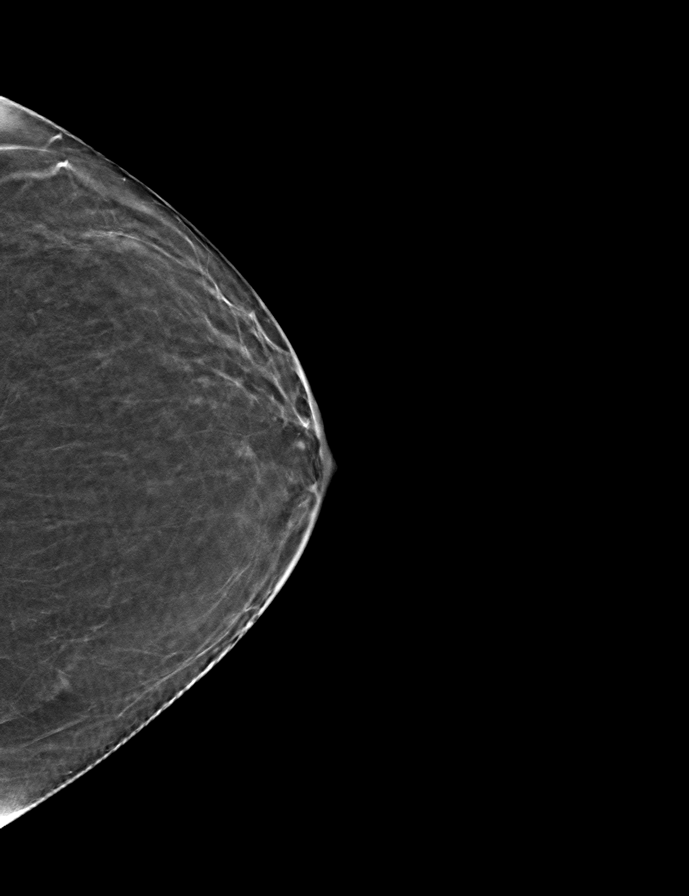

[R CC tomo · tomo slice 25/48.0]
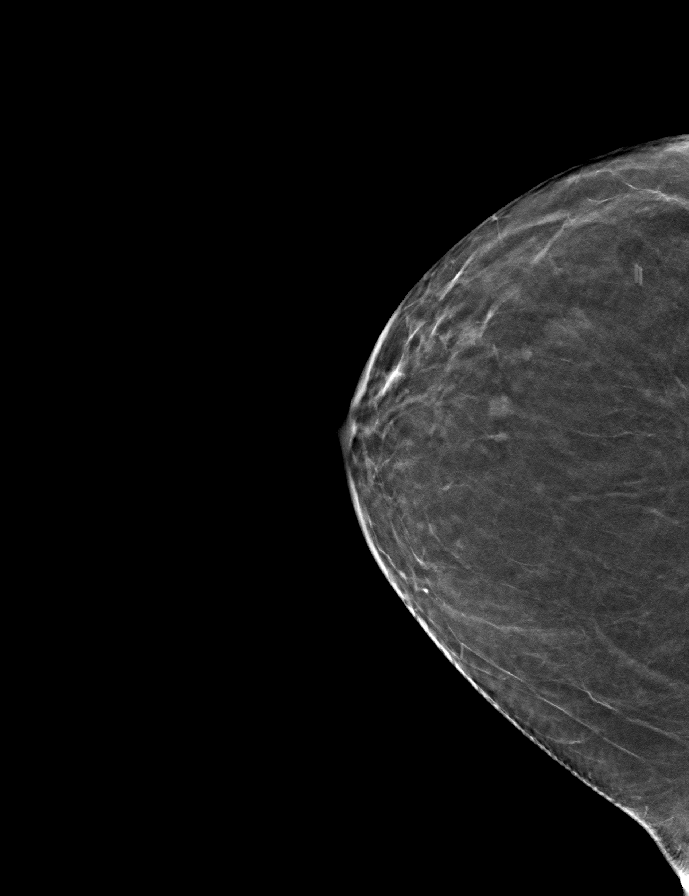

[9 of 24 positions shown; findings below may reference images not displayed]

ACR Breast Density Category b: There are scattered areas of
fibroglandular density.
FINDINGS: There are no findings suspicious for malignancy. Images were
processed with CAD.
IMPRESSION: No mammographic evidence of malignancy. A result letter of this
screening mammogram will be mailed directly to the patient.

RECOMMENDATION:
Screening mammogram in one year. (Code:[id number])

BI-RADS CATEGORY  1: Negative.

## 2021-11-30 ENCOUNTER — Ambulatory Visit: Payer: Medicaid Other | Attending: Cardiology | Admitting: Cardiology

## 2021-11-30 ENCOUNTER — Encounter: Payer: Self-pay | Admitting: Cardiology

## 2021-11-30 VITALS — BP 124/70 | HR 70 | Ht 67.0 in | Wt 178.6 lb

## 2021-11-30 DIAGNOSIS — I428 Other cardiomyopathies: Secondary | ICD-10-CM | POA: Diagnosis not present

## 2021-11-30 DIAGNOSIS — I739 Peripheral vascular disease, unspecified: Secondary | ICD-10-CM | POA: Diagnosis not present

## 2021-11-30 DIAGNOSIS — Z79899 Other long term (current) drug therapy: Secondary | ICD-10-CM

## 2021-11-30 DIAGNOSIS — E782 Mixed hyperlipidemia: Secondary | ICD-10-CM | POA: Diagnosis not present

## 2021-11-30 MED ORDER — VARENICLINE TARTRATE 1 MG PO TABS: 1.0000 mg | ORAL_TABLET | Freq: Two times a day (BID) | ORAL | 0 refills | Status: DC

## 2021-11-30 NOTE — Progress Notes (Signed)
Cardiology Office Note  Date: 11/30/2021   ID: Smith, Mckenzie [birth year], MRN [id number]  PCP:  Wilburt Finlay, MD  Cardiologist:  Rozann Lesches, MD Electrophysiologist:  None   Chief Complaint  Patient presents with   Cardiac follow-up    History of Present Illness: [name] is a 61 y.o. female last seen in October 2022.  She has had interval follow-up in the heart failure clinic, I reviewed Dr. Claris Gladden note from August.  She has HFrEF with nonischemic cardiomyopathy  She reports NYHA class II dyspnea at this time, no exertional chest pain, no palpitations or syncope.  Weight has been relatively stable.  She quit smoking 3 weeks ago, tolerated Chantix and needs a refill.  Echocardiogram from November 2022 revealed LVEF 45 to 50% range with mild diastolic dysfunction and normal RV contraction.  Reviewed her medications which are noted below.  I personally reviewed her ECG which shows sinus rhythm with left anterior fascicular block, decreased R wave progression.  Past Medical History:  Diagnosis Date   Anal fissure    Anxiety    Chronic systolic (congestive) heart failure ([place])    a. 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23   Mixed hyperlipidemia    Nonischemic cardiomyopathy ([place])    Type 2 diabetes mellitus ([place])     Past Surgical History:  Procedure Laterality Date   BACK SURGERY  2005   lumbar disckectomy   COLONOSCOPY  10/01/2011   Procedure: COLONOSCOPY;  Surgeon: [name], MD;  [facility];  Service: Endoscopy;  Laterality: N/A;  730   COLONOSCOPY N/A 10/04/2016   Procedure: COLONOSCOPY;  Surgeon: [name], MD;  [facility];  Service: Endoscopy;  Laterality: N/A;  10:30   DILATION AND CURETTAGE OF UTERUS     FOOT BONE EXCISION     right   POLYPECTOMY   10/04/2016   Procedure: POLYPECTOMY;  Surgeon: [name], MD;  [facility];  Service: Endoscopy;;  colon   RIGHT/LEFT HEART CATH AND CORONARY ANGIOGRAPHY N/A 03/23/2019   Procedure: RIGHT/LEFT HEART CATH AND CORONARY ANGIOGRAPHY;  Surgeon: [name], MD;  Location: [facility];  Service: Cardiovascular;  Laterality: N/A;    Current Outpatient Medications  Medication Sig Dispense Refill   Accu-Chek FastClix Lancets MISC USE TO CHECK TWICE DAILY. 100 each 2   ACCU-CHEK GUIDE test strip Use as instructed to monitor glucose twice daily 400 each 3   acetaminophen (TYLENOL 8 HOUR) 650 MG CR tablet Take 1 tablet (650 mg total) by mouth every 8 (eight) hours as needed for pain or fever. 20 tablet 0   acetaminophen (TYLENOL) 325 MG tablet Take 650 mg by mouth every 6 (six) hours as needed.     aspirin EC 81 MG tablet Take 81 mg by mouth daily. Swallow whole.     atorvastatin (LIPITOR) 80 MG tablet Take 1 tablet (80 mg total) by mouth daily. 30 tablet 6   Blood Glucose Monitoring Suppl (ACCU-CHEK GUIDE ME) w/Device KIT Use to monitor blood glucose twice daily, before breakfast and before bed. 1 kit 1   busPIRone (BUSPAR) 7.5 MG tablet Take 7.5 mg by mouth 3 (three) times daily.     carvedilol (COREG) 6.25  MG tablet Take 1 tablet (6.25 mg total) by mouth 2 (two) times daily with a meal. TAKE 1.5 TABLETS BY MOUTH TWICE A DAY WITH A MEAL 270 tablet 3   Cholecalciferol (VITAMIN D3) 50 MCG (2000 UT) capsule Take 1 capsule by mouth daily.     diclofenac Sodium (VOLTAREN) 1 % GEL Apply 1 application topically 4 (four) times daily.     ENTRESTO 24-26 MG TAKE ONE TABLET BY MOUTH TWICE A DAY 60 tablet 11   FARXIGA 10 MG TABS tablet TAKE ONE TABLET (10MG TOTAL) BY MOUTH DAILY 30 tablet 11   fenofibrate (TRICOR) 145 MG tablet Take 1 tablet (145 mg total) by mouth daily. 30 tablet 6   FLUoxetine (PROZAC) 40 MG capsule Take 40 mg by mouth daily.     fluticasone (FLONASE) 50 MCG/ACT  nasal spray Place 1 spray into both nostrils as needed for allergies or rhinitis.     glipiZIDE (GLUCOTROL) 5 MG tablet Take 0.5 tablets (2.5 mg total) by mouth daily before breakfast. 90 tablet 3   levocetirizine (XYZAL) 5 MG tablet Take 1 tablet by mouth every evening.     loperamide (IMODIUM) 2 MG capsule TAKE 2 CAPSULES BY MOUTH INITIALLY, THEN TAKE 1 CAPSULE AFTER EACH LOOSE STOOL. DO NOT EXCEED 8 CAPSULES IN 24 HOURS.     metroNIDAZOLE (FLAGYL) 500 MG tablet Take 500 mg by mouth 2 (two) times daily. As needed for flare up     pantoprazole (PROTONIX) 40 MG tablet Take 1 tablet (40 mg total) by mouth daily. 30 tablet 0   spironolactone (ALDACTONE) 25 MG tablet TAKE ONE TABLET (25MG TOTAL) BY MOUTH DAILY 90 tablet 3   zolpidem (AMBIEN) 10 MG tablet Take 10 mg by mouth at bedtime as needed for sleep.     varenicline (CHANTIX) 1 MG tablet Take 1 tablet (1 mg total) by mouth 2 (two) times daily. 180 tablet 0   No current facility-administered medications for this visit.   Allergies:  Metformin   ROS: No orthopnea or PND.  Physical Exam: VS:  BP 124/70 (BP Location: Left Arm, Patient Position: Sitting, Cuff Size: Normal)   Pulse 70   Ht _0  (1.702 m)   Wt 178 lb 9.6 oz (81 kg)   LMP 09/25/2011   SpO2 97%   BMI 27.97 kg/m , BMI Body mass index is 27.97 kg/m.  Wt Readings from Last 3 Encounters:  11/30/21 178 lb 9.6 oz (81 kg)  09/29/21 173 lb 12.8 oz (78.8 kg)  08/22/21 175 lb (79.4 kg)    General: Patient appears comfortable at rest. HEENT: Conjunctiva and lids normal. Neck: Supple, no elevated JVP or carotid bruits. Lungs: Clear to auscultation, nonlabored breathing at rest. Cardiac: Regular rate and rhythm, no S3 or significant systolic murmur. Extremities: No pitting edema.  ECG:  An ECG dated 08/03/2020 was personally reviewed today and demonstrated:  Sinus rhythm with left anterior fascicular block, poor R wave progression and increased voltage.  Recent  Labwork: 09/29/2021: ALT 16; AST 17; BUN 32; Creatinine, Ser 1.67; Potassium 4.5; Sodium 136     Component Value Date/Time   CHOL 151 09/29/2021 1237   TRIG 442 (H) 09/29/2021 1237   HDL 35 (L) 09/29/2021 1237   CHOLHDL 4.3 09/29/2021 1237   VLDL UNABLE TO CALCULATE IF TRIGLYCERIDE OVER 400 mg/dL 09/29/2021 1237   LDLCALC UNABLE TO CALCULATE IF TRIGLYCERIDE OVER 400 mg/dL 09/29/2021 1237   LDLDIRECT 80 09/29/2021 1237    Other Studies Reviewed Today:  Echocardiogram 01/10/2021:  1. Left ventricular ejection fraction, by estimation, is 45 to 50%. The  left ventricle has mildly decreased function. The left ventricle  demonstrates global hypokinesis. Left ventricular diastolic parameters are  consistent with Grade I diastolic  dysfunction (impaired relaxation).   2. Right ventricular systolic function is normal. The right ventricular  size is normal.   3. There is a trivial pericardial effusion posterior to the left  ventricle.   4. The mitral valve is grossly normal. No evidence of mitral valve  regurgitation.   5. The aortic valve is tricuspid. Aortic valve regurgitation is not  visualized.   6. The inferior vena cava is normal in size with greater than 50%  respiratory variability, suggesting right atrial pressure of 3 mmHg.   Assessment and Plan:  1.  HFmrEF with nonischemic cardiomyopathy, LVEF 45 to 50% by echocardiogram last year.  She is clinically stable with NYHA class II dyspnea.  Continue Coreg, [place], Farxiga, and Aldactone.  I reviewed her lab work from August.  2.  Tobacco abuse in remission.  Quit smoking about 3 weeks ago.  Refill Chantix for 50-monthsupply.  3.  CKD stage IIIb, creatinine 1.67.  4.  PAD with no progressive claudication, followed by [facility].  Continues on aspirin and reports consistent use of Lipitor.  We will recheck FLP.  Her LDL was 80 in August.  Medication Adjustments/Labs and Tests Ordered: Current medicines are reviewed at length with the  patient today.  Concerns regarding medicines are outlined above.   Tests Ordered: Orders Placed This Encounter  Procedures   Lipid panel   EKG 12-Lead    Medication Changes: Meds ordered this encounter  Medications   varenicline (CHANTIX) 1 MG tablet    Sig: Take 1 tablet (1 mg total) by mouth 2 (two) times daily.    Dispense:  180 tablet    Refill:  0    Additional refills should come from PCP thanks    Disposition:  Follow up  6 months.  Signed, SSatira Sark MD, FDe Witt Hospital & Nursing Home10/06/2021 3:46 PM    CThayerat [place] [address]Phone: [phone] Fax: [phone]

## 2021-11-30 NOTE — Patient Instructions (Addendum)
Medication Instructions:  Your physician recommends that you continue on your current medications as directed. Please refer to the Current Medication list given to you today.  Labwork: Your physician recommends that you return for a FASTING lipid within the next week. Please do not eat or drink for at least 8 hours when you have this done. You may take your medications that morning with a sip of water. [employer] ([place]. [place])  Testing/Procedures: none  Follow-Up: Your physician recommends that you schedule a follow-up appointment in: 6 months  Any Other Special Instructions Will Be Listed Below (If Applicable).  If you need a refill on your cardiac medications before your next appointment, please call your pharmacy.

## 2021-12-02 LAB — LIPID PANEL
Chol/HDL Ratio: 3.3 ratio (ref 0.0–4.4)
Cholesterol, Total: 133 mg/dL (ref 100–199)
HDL: 40 mg/dL (ref >39)
LDL Chol Calc (NIH): 70 mg/dL (ref 0–99)
Triglycerides: 131 mg/dL (ref 0–149)
VLDL Cholesterol Cal: 23 mg/dL (ref 5–40)

## 2021-12-04 ENCOUNTER — Encounter: Payer: Self-pay | Admitting: Cardiology

## 2021-12-08 ENCOUNTER — Encounter: Payer: Self-pay | Admitting: Cardiology

## 2021-12-19 ENCOUNTER — Encounter (INDEPENDENT_AMBULATORY_CARE_PROVIDER_SITE_OTHER): Payer: Self-pay

## 2021-12-19 ENCOUNTER — Ambulatory Visit (INDEPENDENT_AMBULATORY_CARE_PROVIDER_SITE_OTHER): Payer: Medicaid Other | Admitting: Gastroenterology

## 2021-12-19 ENCOUNTER — Encounter (INDEPENDENT_AMBULATORY_CARE_PROVIDER_SITE_OTHER): Payer: Self-pay | Admitting: Gastroenterology

## 2021-12-19 ENCOUNTER — Other Ambulatory Visit (INDEPENDENT_AMBULATORY_CARE_PROVIDER_SITE_OTHER): Payer: Self-pay

## 2021-12-19 ENCOUNTER — Telehealth (INDEPENDENT_AMBULATORY_CARE_PROVIDER_SITE_OTHER): Payer: Self-pay

## 2021-12-19 VITALS — BP 139/78 | HR 70 | Temp 98.6°F | Ht 67.0 in | Wt 176.8 lb

## 2021-12-19 DIAGNOSIS — Z1211 Encounter for screening for malignant neoplasm of colon: Secondary | ICD-10-CM

## 2021-12-19 DIAGNOSIS — Z86010 Personal history of colonic polyps: Secondary | ICD-10-CM

## 2021-12-19 DIAGNOSIS — K582 Mixed irritable bowel syndrome: Secondary | ICD-10-CM | POA: Diagnosis not present

## 2021-12-19 DIAGNOSIS — Z719 Counseling, unspecified: Secondary | ICD-10-CM | POA: Diagnosis not present

## 2021-12-19 MED ORDER — PANTOPRAZOLE SODIUM 40 MG PO TBEC: 40.0000 mg | DELAYED_RELEASE_TABLET | Freq: Every day | ORAL | 3 refills | Status: DC

## 2021-12-19 MED ORDER — PEG 3350-KCL-NA BICARB-NACL 420 G PO SOLR: 4000.0000 mL | ORAL | 0 refills | Status: DC

## 2021-12-19 MED ORDER — PANTOPRAZOLE SODIUM 40 MG PO TBEC
40.0000 mg | DELAYED_RELEASE_TABLET | Freq: Every day | ORAL | 3 refills | Status: DC
Start: 2021-12-19 — End: 2021-12-19

## 2021-12-19 MED ORDER — LOPERAMIDE HCL 2 MG PO CAPS
ORAL_CAPSULE | ORAL | 2 refills | Status: AC
Start: 2021-12-19 — End: ?

## 2021-12-19 NOTE — Progress Notes (Signed)
Referring Provider: Wilburt Finlay, MD Primary Care Physician:  Leeanne Rio, MD Primary GI Physician: NEW  Chief Complaint  Patient presents with   Constipation    Has always had trouble with diarrhea and this past year has had a lot of constipation. Would like a refill on loperamide.    HPI:   [name] is a 61 y.o. female with past medical history of anal fissure, anxiety, CHF, depression, HTN, GERD, HLD, nonischemic cardiomyopathy, DM Type 2.  Patient presenting today as a new patient for diarrhea/constipation and to schedule colonoscopy.  Last seen in 2018, at that time alternating between constipation and diarrhea. Advised to start fiber .  Today, patient states that she here to schedule colonoscopy due to history of polyps. She notes that she has alternated between looser stools and constipation for years. Can go up to 3-4 times per day when Stools are loose and will feel more "rolling" in her stomach during that time but denies specific abdominal pain. She is taking loperamide PRN with good results. Has constipation sometimes with feeling like she has the urge to have a BM but sometimes cannot. She will strain and will have to take a laxative to help. she does this very occasionally. She does note loose stools this morning. She notes she has some fecal urgency during times of looser stools. she increased fiber in diet previously but did not take fiber supplements.  Denies rectal bleeding or melena. Denies changes in appetite or weight loss. Does not seem to matter what she eats in regards to stools.   GERD well managed on protonix $RemoveBef'40mg'vAqQnTxTwG$  daily. She denies any dysphagia, odynophagia or early satiety  NSAID use: none Social hx: stopped smoking maybe 8 weeks ago, no etoh  Fam hx:no crc, pancreatic cancer or liver disease  Last Colonoscopy:10/04/16 - Two small polyps in the proximal sigmoid colon and in the ascending colon-tubular adenomas - Diverticulosis in the sigmoid colon,  in the descending colon, at the splenic flexure and in the transverse colon. - Internal hemorrhoids. Last Endoscopy:never  Recommendations:  Repeat in 5 years  Past Medical History:  Diagnosis Date   Anal fissure    Anxiety    Chronic systolic (congestive) heart failure ([facility])    a. 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65   Mixed hyperlipidemia    Nonischemic cardiomyopathy ([place])    Type 2 diabetes mellitus ([place])     Past Surgical History:  Procedure Laterality Date   BACK SURGERY  2005   lumbar disckectomy   COLONOSCOPY  10/01/2011   Procedure: COLONOSCOPY;  Surgeon: [name], MD;  [facility];  Service: Endoscopy;  Laterality: N/A;  730   COLONOSCOPY N/A 10/04/2016   Procedure: COLONOSCOPY;  Surgeon: [name], MD;  [facility];  Service: Endoscopy;  Laterality: N/A;  10:30   DILATION AND CURETTAGE OF UTERUS     FOOT BONE EXCISION     right   POLYPECTOMY  10/04/2016   Procedure: POLYPECTOMY;  Surgeon: [name], MD;  [facility];  Service: Endoscopy;;  colon   RIGHT/LEFT HEART CATH AND CORONARY ANGIOGRAPHY N/A 03/23/2019   Procedure: RIGHT/LEFT HEART CATH AND CORONARY ANGIOGRAPHY;  Surgeon: [name], MD;  [facility]  INVASIVE [facility];  Service: Cardiovascular;  Laterality: N/A;    Current Outpatient Medications  Medication Sig Dispense Refill   Accu-Chek FastClix Lancets MISC USE TO CHECK TWICE DAILY. 100 each 2   ACCU-CHEK GUIDE test strip Use as instructed to monitor glucose twice daily 400 each 3   aspirin EC 81 MG tablet Take 81 mg by mouth daily. Swallow whole.     atorvastatin (LIPITOR) 80 MG tablet Take 1 tablet (80 mg total) by mouth daily. 30 tablet 6   Blood Glucose Monitoring Suppl (ACCU-CHEK GUIDE ME) w/Device KIT Use to monitor blood glucose  twice daily, before breakfast and before bed. 1 kit 1   busPIRone (BUSPAR) 7.5 MG tablet Take 7.5 mg by mouth 2 (two) times daily.     carvedilol (COREG) 6.25 MG tablet Take 1 tablet (6.25 mg total) by mouth 2 (two) times daily with a meal. TAKE 1.5 TABLETS BY MOUTH TWICE A DAY WITH A MEAL 270 tablet 3   Cholecalciferol (VITAMIN D3) 50 MCG (2000 UT) capsule Take 1 capsule by mouth daily.     diclofenac Sodium (VOLTAREN) 1 % GEL Apply 1 application topically 4 (four) times daily.     ENTRESTO 24-26 MG TAKE ONE TABLET BY MOUTH TWICE A DAY 60 tablet 11   FARXIGA 10 MG TABS tablet TAKE ONE TABLET ($RemoveBef'10MG'hlcsnRADzl$  TOTAL) BY MOUTH DAILY 30 tablet 11   fenofibrate (TRICOR) 145 MG tablet Take 1 tablet (145 mg total) by mouth daily. 30 tablet 6   FLUoxetine (PROZAC) 40 MG capsule Take 40 mg by mouth daily.     fluticasone (FLONASE) 50 MCG/ACT nasal spray Place 1 spray into both nostrils as needed for allergies or rhinitis.     glipiZIDE (GLUCOTROL) 5 MG tablet Take 0.5 tablets (2.5 mg total) by mouth daily before breakfast. (Patient taking differently: Take 5 mg by mouth daily before breakfast.) 90 tablet 3   levocetirizine (XYZAL) 5 MG tablet Take 1 tablet by mouth every evening.     loperamide (IMODIUM) 2 MG capsule TAKE 2 CAPSULES BY MOUTH INITIALLY, THEN TAKE 1 CAPSULE AFTER EACH LOOSE STOOL. DO NOT EXCEED 8 CAPSULES IN 24 HOURS.     pantoprazole (PROTONIX) 40 MG tablet Take 1 tablet (40 mg total) by mouth daily. 30 tablet 0   spironolactone (ALDACTONE) 25 MG tablet TAKE ONE TABLET ($RemoveBef'25MG'WHGXpJeIre$  TOTAL) BY MOUTH DAILY 90 tablet 3   varenicline (CHANTIX) 1 MG tablet Take 1 tablet (1 mg total) by mouth 2 (two) times daily. 180 tablet 0   zolpidem (AMBIEN) 10 MG tablet Take 10 mg by mouth at bedtime as needed for sleep.     acetaminophen (TYLENOL) 325 MG tablet Take 650 mg by mouth every 6 (six) hours as needed.     No current facility-administered medications for this visit.    Allergies as of 12/19/2021 - Review  Complete 12/19/2021  Allergen Reaction Noted   Metformin  09/26/2020    Family History  Problem Relation Age of Onset   Arthritis Mother    Hernia Mother    Constipation Mother    Heart failure Father        Underwent heart transplant   Colon cancer Neg Hx     Social History   Socioeconomic History   Marital status: Divorced    Spouse name: Not on file   Number of children: Not on file   Years of education: Not on file   Highest education level: Not on file  Occupational History  Not on file  Tobacco Use   Smoking status: Every Day    Packs/day: 0.25    Years: 37.00    Total pack years: 9.25    Types: Cigarettes    Passive exposure: Never   Smokeless tobacco: Never   Tobacco comments:    1-2 cigarettes daily  Vaping Use   Vaping Use: Never used  Substance and Sexual Activity   Alcohol use: No   Drug use: No   Sexual activity: Yes    Birth control/protection: None  Other Topics Concern   Not on file  Social History Narrative   Not on file   Social Determinants of Health   Financial Resource Strain: Unknown (04/30/2018)   Overall Financial Resource Strain (CARDIA)    Difficulty of Paying Living Expenses: Patient refused  Food Insecurity: Unknown (04/30/2018)   Hunger Vital Sign    Worried About Running Out of Food in the Last Year: Patient refused    [place] in the Last Year: Patient refused  Transportation Needs: Unknown (04/30/2018)   PRAPARE - Transportation    Lack of Transportation (Medical): Patient refused    Lack of Transportation (Non-Medical): Patient refused  Physical Activity: Unknown (04/30/2018)   Exercise Vital Sign    Days of Exercise per Week: Patient refused    Minutes of Exercise per Session: Patient refused  Stress: Unknown (04/30/2018)   [place] of Stress : Patient refused  Social Connections: Unknown (04/30/2018)   Social Connection and Isolation Panel  [NHANES]    Frequency of Communication with Friends and Family: Patient refused    Frequency of Social Gatherings with Friends and Family: Patient refused    Attends Religious Services: Patient refused    Marine scientist or Organizations: Patient refused    Attends Music therapist: Patient refused    Marital Status: Patient refused   Review of systems General: negative for malaise, night sweats, fever, chills, weight loss Neck: Negative for lumps, goiter, pain and significant neck swelling Resp: Negative for cough, wheezing, dyspnea at rest CV: Negative for chest pain, leg swelling, palpitations, orthopnea GI: denies melena, hematochezia, nausea, vomiting, dysphagia, odyonophagia, early satiety or unintentional weight loss. +constipation +diarrhea MSK: Negative for joint pain or swelling, back pain, and muscle pain. Derm: Negative for itching or rash Psych: Denies depression, anxiety, memory loss, confusion. No homicidal or suicidal ideation.  Heme: Negative for prolonged bleeding, bruising easily, and swollen nodes. Endocrine: Negative for cold or heat intolerance, polyuria, polydipsia and goiter. Neuro: negative for tremor, gait imbalance, syncope and seizures. The remainder of the review of systems is noncontributory.  Physical Exam: BP 139/78 (BP Location: Left Arm, Patient Position: Sitting, Cuff Size: Large)   Pulse 70   Temp 98.6 F (37 C) (Oral)   Ht $R'5\' 7"'yU$  (1.702 m)   Wt 176 lb 12.8 oz (80.2 kg)   LMP 09/25/2011   BMI 27.69 kg/m  General:   Alert and oriented. No distress noted. Pleasant and cooperative.  Head:  Normocephalic and atraumatic. Eyes:  Conjuctiva clear without scleral icterus. Mouth:  Oral mucosa pink and moist. Good dentition. No lesions. Heart: Normal rate and rhythm, s1 and s2 heart sounds present.  Lungs: Clear lung sounds in all lobes. Respirations equal and unlabored. Abdomen:  +BS, soft, non-tender and non-distended. No rebound or  guarding. No HSM or masses noted. Derm: No palmar erythema or jaundice Msk:  Symmetrical without  gross deformities. Normal posture. Extremities:  Without edema. Neurologic:  Alert and  oriented x4 Psych:  Alert and cooperative. Normal mood and affect.  Invalid input(s): "6 MONTHS"   ASSESSMENT: [name] is a 61 y.o. female presenting today as a new patient to schedule colonoscopy, alternating between constipation and diarrhea.  Patient reports history of alternating between constipation and diarrhea, this is baseline for her and has been ongoing for many years. She is unsure if certain foods tend to make her symptoms worse. Notes some fecal urgency with looser stools and may go up to 3-4x/day. When having constipation she will take a laxative to help her go though this is very occasional. She takes loperamide PRN for loose stools with good results. Has not tried any fiber supplements in the past. She has no rectal bleeding or melena. 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 Discussed using loperamide only as needed and cautioned that this can cause constipation if used too often. Recommend starting benefiber 1T BID with meals and continued ample water intake. Will provide low fodmap diet and encouraged to keep food/stool journal for the next few weeks to pinpoint triggers.  GERD well managed on protonix $RemoveBef'40mg'IkdKJENXmH$  daily without breakthrough symptoms. She is requesting we take over management of this.  History of tubular adenomas, last colonoscopy in 2018 with presence of two TAs. Indications, risks and benefits of procedure discussed in detail with patient. Patient verbalized understanding and is in agreement to proceed with Colonoscopy at this time.     PLAN:  Continue protonix $RemoveBeforeDEI'40mg'HrznViEmtDreIzBl$  daily, refill sent 2. Continue loperamide PRN, use sparingly, refill sent 3. Schedule colonoscopy-ASA III, ENDO 3 4. Benefiber 1T BID with meals 5. Good water intake  6. Low FODMAP diet and food/stool journal x2  weeks   All questions were answered, patient verbalized understanding and is in agreement with plan as outlined above.    Follow Up: 1 year  Kateria Cutrona L. Alver Sorrow, MSN, APRN, AGNP-C Adult-Gerontology Nurse Practitioner [facility]

## 2021-12-19 NOTE — Patient Instructions (Signed)
It was nice to meet you! We will get you scheduled for colonoscopy Please make sure water intake is good, you can start over the counter benefiber 1T twice daily with meals, this can help to balance out your stool consistency I will send refill or protonix '40mg'$  once daily and loperamide to be used as needed for diarrhea, be mindful that this can cause constipation.  Follow up 1 year

## 2021-12-19 NOTE — Telephone Encounter (Signed)
[name], CMA  ?

## 2021-12-20 ENCOUNTER — Encounter (INDEPENDENT_AMBULATORY_CARE_PROVIDER_SITE_OTHER): Payer: Self-pay

## 2022-01-22 ENCOUNTER — Other Ambulatory Visit: Payer: Self-pay

## 2022-02-07 NOTE — Patient Instructions (Signed)
[name]  02/07/2022     '@PREFPERIOPPHARMACY'$ @   Your procedure is scheduled on 02/13/2022.  Report to [facility] at 7:45 A.M.  Call this number if you have problems the morning of surgery:  [phone]  If you experience any cold or flu symptoms such as cough, fever, chills, shortness of breath, etc. between now and your scheduled surgery, please notify us at the above number.   Remember:   Please Follow the Diet and Prep instructions given to you by Dr Colman Cater office.      Take these medicines the morning of surgery with A SIP OF WATER : Buspar Carvedilol, Entresto, Prozac, Flonase and Protonix    Do not wear jewelry, make-up or nail polish.  Do not wear lotions, powders, or perfumes, or deodorant.  Do not shave 48 hours prior to surgery.  Men may shave face and neck.  Do not bring valuables to the hospital.  [facility] is not responsible for any belongings or valuables.  Contacts, dentures or bridgework may not be worn into surgery.  Leave your suitcase in the car.  After surgery it may be brought to your room.  For patients admitted to the hospital, discharge time will be determined by your treatment team.  Patients discharged the day of surgery will not be allowed to drive home.   Name and phone number of your driver:   Family Special instructions:  N/A  Please read over the following fact sheets that you were given. Care and Recovery After Surgery   Colonoscopy, Adult A colonoscopy is a procedure to look at the entire large intestine. This procedure is done using a long, thin, flexible tube that has a camera on the end. You may have a colonoscopy: As a part of normal colorectal screening. If you have certain symptoms, such as: A low number of red blood cells in your blood (anemia). Diarrhea that does not go away. Pain in your abdomen. Blood in your stool. A colonoscopy can help screen for and diagnose medical problems, including: An abnormal growth  of cells or tissue (tumor). Abnormal growths within the lining of your intestine (polyps). Inflammation. Areas of bleeding. Tell your health care provider about: Any allergies you have. All medicines you are taking, including vitamins, herbs, eye drops, creams, and over-the-counter medicines. Any problems you or family members have had with anesthetic medicines. Any bleeding problems you have. Any surgeries you have had. Any medical conditions you have. Any problems you have had with having bowel movements. Whether you are pregnant or may be pregnant. What are the risks? Generally, this is a safe procedure. However, problems may occur, including: Bleeding. Damage to your intestine. Allergic reactions to medicines given during the procedure. Infection. This is rare. What happens before the procedure? Eating and drinking restrictions Follow instructions from your health care provider about eating or drinking restrictions, which may include: A few days before the procedure: Follow a low-fiber diet. Avoid nuts, seeds, dried fruit, raw fruits, and vegetables. 1-3 days before the procedure: Eat only gelatin dessert or ice pops. Drink only clear liquids, such as water, clear juice, clear broth or bouillon, black coffee or tea, or clear soft drinks or sports drinks. Avoid liquids that contain red or purple dye. The day of the procedure: Do not eat solid foods. You may continue to drink clear liquids until up to 2 hours before the procedure. Do not eat or drink anything starting 2 hours before the procedure, or within the  time period that your health care provider recommends. Bowel prep If you were prescribed a bowel prep to take by mouth (orally) to clean out your colon: Take it as told by your health care provider. Starting the day before your procedure, you will need to drink a large amount of liquid medicine. The liquid will cause you to have many bowel movements of loose stool until  your stool becomes almost clear or light green. If your skin or the opening between the buttocks (anus) gets irritated from diarrhea, you may relieve the irritation using: Wipes with medicine in them, such as adult wet wipes with aloe and vitamin E. A product to soothe skin, such as petroleum jelly. If you vomit while drinking the bowel prep: Take a break for up to 60 minutes. Begin the bowel prep again. Call your health care provider if you keep vomiting or you cannot take the bowel prep without vomiting. To clean out your colon, you may also be given: Laxative medicines. These help you have a bowel movement. Instructions for enema use. An enema is liquid medicine injected into your rectum. Medicines Ask your health care provider about: Changing or stopping your regular medicines or supplements. This is especially important if you are taking iron supplements, diabetes medicines, or blood thinners. Taking medicines such as aspirin and ibuprofen. These medicines can thin your blood. Do not take these medicines unless your health care provider tells you to take them. Taking over-the-counter medicines, vitamins, herbs, and supplements. General instructions Ask your health care provider what steps will be taken to help prevent infection. These may include washing skin with a germ-killing soap. If you will be going home right after the procedure, plan to have a responsible adult: Take you home from the hospital or clinic. You will not be allowed to drive. Care for you for the time you are told. What happens during the procedure?  An IV will be inserted into one of your veins. You will be given a medicine to make you fall asleep (general anesthetic). You will lie on your side with your knees bent. A lubricant will be put on the tube. Then the tube will be: Inserted into your anus. Gently eased through all parts of your large intestine. Air will be sent into your colon to keep it open. This may  cause some pressure or cramping. Images will be taken with the camera and will appear on a screen. A small tissue sample may be removed to be looked at under a microscope (biopsy). The tissue may be sent to a lab for testing if any signs of problems are found. If small polyps are found, they may be removed and checked for cancer cells. When the procedure is finished, the tube will be removed. The procedure may vary among health care providers and hospitals. What happens after the procedure? Your blood pressure, heart rate, breathing rate, and blood oxygen level will be monitored until you leave the hospital or clinic. You may have a small amount of blood in your stool. You may pass gas and have mild cramping or bloating in your abdomen. This is caused by the air that was used to open your colon during the exam. If you were given a sedative during the procedure, it can affect you for several hours. Do not drive or operate machinery until your health care provider says that it is safe. It is up to you to get the results of your procedure. Ask your health care provider, or  the department that is doing the procedure, when your results will be ready. Summary A colonoscopy is a procedure to look at the entire large intestine. Follow instructions from your health care provider about eating and drinking before the procedure. If you were prescribed an oral bowel prep to clean out your colon, take it as told by your health care provider. During the colonoscopy, a flexible tube with a camera on its end is inserted into the anus and then passed into all parts of the large intestine. This information is not intended to replace advice given to you by your health care provider. Make sure you discuss any questions you have with your health care provider. Document Revised: 02/06/2021 Document Reviewed: 10/05/2020 Elsevier Patient Education  [place] Anesthesia refers to the  techniques, procedures, and medicines that help a person stay safe and comfortable during surgery. Monitored anesthesia care, or sedation, is one type of anesthesia. You may have sedation if you do not need to be asleep for your procedure. Procedures that use sedation may include: Surgery to remove cataracts from your eyes. A dental procedure. A biopsy. This is when a tissue sample is removed and looked at under a microscope. You will be watched closely during your procedure. Your level of sedation or type of anesthesia may be changed to fit your needs. Tell a health care provider about: Any allergies you have. All medicines you are taking, including vitamins, herbs, eye drops, creams, and over-the-counter medicines. Any problems you or family members have had with anesthesia. Any bleeding problems you have. Any surgeries you have had. Any medical conditions or illnesses you have. This includes sleep apnea, cough, fever, or the flu. Whether you are pregnant or may be pregnant. Whether you use cigarettes, alcohol, or drugs. Any use of steroids, whether by mouth or as a cream. What are the risks? Your health care provider will talk with you about risks. These may include: Getting too much medicine (oversedation). Nausea. Allergic reactions to medicines. Trouble breathing. If this happens, a breathing tube may be used to help you breathe. It will be removed when you are awake and breathing on your own. Heart trouble. Lung trouble. Confusion that gets better with time (emergence delirium). What happens before the procedure? When to stop eating and drinking Follow instructions from your health care provider about what you may eat and drink. These may include: 8 hours before your procedure Stop eating most foods. Do not eat meat, fried foods, or fatty foods. Eat only light foods, such as toast or crackers. All liquids are okay except energy drinks and alcohol. 6 hours before your  procedure Stop eating. Drink only clear liquids, such as water, clear fruit juice, black coffee, plain tea, and sports drinks. Do not drink energy drinks or alcohol. 2 hours before your procedure Stop drinking all liquids. You may be allowed to take medicines with small sips of water. If you do not follow your health care provider's instructions, your procedure may be delayed or canceled. Medicines Ask your health care provider about: Changing or stopping your regular medicines. These include any diabetes medicines or blood thinners you take. Taking medicines such as aspirin and ibuprofen. These medicines can thin your blood. Do not take them unless your health care provider tells you to. Taking over-the-counter medicines, vitamins, herbs, and supplements. Testing You may have an exam or testing. You may have a blood or urine sample taken. General instructions Do not use any products  that contain nicotine or tobacco for at least 4 weeks before the procedure. These products include cigarettes, chewing tobacco, and vaping devices, such as e-cigarettes. If you need help quitting, ask your health care provider. If you will be going home right after the procedure, plan to have a responsible adult: Take you home from the hospital or clinic. You will not be allowed to drive. Care for you for the time you are told. What happens during the procedure?  Your blood pressure, heart rate, breathing, level of pain, and blood oxygen level will be monitored. An IV will be inserted into one of your veins. You may be given: A sedative. This helps you relax. Anesthesia. This will: Numb certain areas of your body. Make you fall asleep for surgery. You will be given medicines as needed to keep you comfortable. The more medicine you are given, the deeper your level of sedation will be. Your level of sedation may be changed to fit your needs. There are three levels of sedation: Mild sedation. At this level,  you may feel awake and relaxed. You will be able to follow directions. Moderate sedation. At this level, you will be sleepy. You may not remember the procedure. Deep sedation. At this level, you will be asleep. You will not remember the procedure. How you get the medicines will depend on your age and the procedure. They may be given as: A pill. This may be taken by mouth (orally) or inserted into the rectum. An injection. This may be into a vein or muscle. A spray through the nose. After your procedure is over, the medicine will be stopped. The procedure may vary among health care providers and hospitals. What happens after the procedure? Your blood pressure, heart rate, breathing rate, and blood oxygen level will be monitored until you leave the hospital or clinic. You may feel sleepy, clumsy, or nauseous. You may not remember what happened during or after the procedure. Sedation can affect you for several hours. Do not drive or use machinery until your health care provider says that it is safe. This information is not intended to replace advice given to you by your health care provider. Make sure you discuss any questions you have with your health care provider. Document Revised: 07/10/2021 Document Reviewed: 07/10/2021 Elsevier Patient Education  [place].

## 2022-02-08 ENCOUNTER — Encounter (HOSPITAL_COMMUNITY): Payer: Self-pay

## 2022-02-08 ENCOUNTER — Encounter (HOSPITAL_COMMUNITY)
Admission: RE | Admit: 2022-02-08 | Discharge: 2022-02-08 | Disposition: A | Discharge status: Home / Self Care | Payer: Medicaid Other | Source: Ambulatory Visit | Attending: Gastroenterology | Admitting: Gastroenterology

## 2022-02-08 VITALS — BP 114/53 | HR 70 | Temp 97.5°F | Resp 18 | Ht 67.0 in | Wt 176.8 lb

## 2022-02-08 DIAGNOSIS — Z01812 Encounter for preprocedural laboratory examination: Secondary | ICD-10-CM | POA: Diagnosis present

## 2022-02-08 DIAGNOSIS — E0800 Diabetes mellitus due to underlying condition with hyperosmolarity without nonketotic hyperglycemic-hyperosmolar coma (NKHHC): Secondary | ICD-10-CM | POA: Insufficient documentation

## 2022-02-08 LAB — BASIC METABOLIC PANEL
Anion gap: 10 (ref 5–15)
BUN: 21 mg/dL — ABNORMAL HIGH (ref 6–20)
CO2: 21 mmol/L — ABNORMAL LOW (ref 22–32)
Calcium: 9.4 mg/dL (ref 8.9–10.3)
Chloride: 107 mmol/L (ref 98–111)
Creatinine, Ser: 1.25 mg/dL — ABNORMAL HIGH (ref 0.44–1.00)
GFR, Estimated: 49 mL/min — ABNORMAL LOW (ref >60)
Glucose, Bld: 182 mg/dL — ABNORMAL HIGH (ref 70–99)
Potassium: 3.3 mmol/L — ABNORMAL LOW (ref 3.5–5.1)
Sodium: 138 mmol/L (ref 135–145)

## 2022-02-13 ENCOUNTER — Ambulatory Visit (HOSPITAL_COMMUNITY): Payer: Medicaid Other | Admitting: Anesthesiology

## 2022-02-13 ENCOUNTER — Ambulatory Visit (HOSPITAL_BASED_OUTPATIENT_CLINIC_OR_DEPARTMENT_OTHER): Payer: Medicaid Other | Admitting: Anesthesiology

## 2022-02-13 ENCOUNTER — Ambulatory Visit (HOSPITAL_COMMUNITY)
Admission: RE | Admit: 2022-02-13 | Discharge: 2022-02-13 | Disposition: A | Discharge status: Home / Self Care | Payer: Medicaid Other | Source: Ambulatory Visit | Attending: Gastroenterology | Admitting: Gastroenterology

## 2022-02-13 ENCOUNTER — Encounter (HOSPITAL_COMMUNITY): Payer: Self-pay | Admitting: Gastroenterology

## 2022-02-13 ENCOUNTER — Encounter (HOSPITAL_COMMUNITY)
Admission: RE | Disposition: A | Discharge status: Home / Self Care | Payer: Self-pay | Source: Ambulatory Visit | Attending: Gastroenterology

## 2022-02-13 DIAGNOSIS — D122 Benign neoplasm of ascending colon: Secondary | ICD-10-CM | POA: Insufficient documentation

## 2022-02-13 DIAGNOSIS — K635 Polyp of colon: Secondary | ICD-10-CM

## 2022-02-13 DIAGNOSIS — N1832 Chronic kidney disease, stage 3b: Secondary | ICD-10-CM

## 2022-02-13 DIAGNOSIS — I130 Hypertensive heart and chronic kidney disease with heart failure and stage 1 through stage 4 chronic kidney disease, or unspecified chronic kidney disease: Secondary | ICD-10-CM | POA: Insufficient documentation

## 2022-02-13 DIAGNOSIS — Z79899 Other long term (current) drug therapy: Secondary | ICD-10-CM | POA: Diagnosis not present

## 2022-02-13 DIAGNOSIS — N189 Chronic kidney disease, unspecified: Secondary | ICD-10-CM | POA: Diagnosis not present

## 2022-02-13 DIAGNOSIS — Z87891 Personal history of nicotine dependence: Secondary | ICD-10-CM

## 2022-02-13 DIAGNOSIS — I5022 Chronic systolic (congestive) heart failure: Secondary | ICD-10-CM | POA: Diagnosis not present

## 2022-02-13 DIAGNOSIS — I509 Heart failure, unspecified: Secondary | ICD-10-CM | POA: Diagnosis not present

## 2022-02-13 DIAGNOSIS — K219 Gastro-esophageal reflux disease without esophagitis: Secondary | ICD-10-CM | POA: Insufficient documentation

## 2022-02-13 DIAGNOSIS — E1122 Type 2 diabetes mellitus with diabetic chronic kidney disease: Secondary | ICD-10-CM | POA: Insufficient documentation

## 2022-02-13 DIAGNOSIS — E782 Mixed hyperlipidemia: Secondary | ICD-10-CM | POA: Diagnosis not present

## 2022-02-13 DIAGNOSIS — Z7984 Long term (current) use of oral hypoglycemic drugs: Secondary | ICD-10-CM | POA: Diagnosis not present

## 2022-02-13 DIAGNOSIS — K649 Unspecified hemorrhoids: Secondary | ICD-10-CM | POA: Diagnosis not present

## 2022-02-13 DIAGNOSIS — K648 Other hemorrhoids: Secondary | ICD-10-CM | POA: Diagnosis not present

## 2022-02-13 DIAGNOSIS — Z86010 Personal history of colonic polyps: Secondary | ICD-10-CM

## 2022-02-13 DIAGNOSIS — D124 Benign neoplasm of descending colon: Secondary | ICD-10-CM | POA: Insufficient documentation

## 2022-02-13 DIAGNOSIS — D125 Benign neoplasm of sigmoid colon: Secondary | ICD-10-CM | POA: Insufficient documentation

## 2022-02-13 DIAGNOSIS — Z1211 Encounter for screening for malignant neoplasm of colon: Secondary | ICD-10-CM | POA: Insufficient documentation

## 2022-02-13 DIAGNOSIS — F32A Depression, unspecified: Secondary | ICD-10-CM | POA: Diagnosis not present

## 2022-02-13 DIAGNOSIS — D123 Benign neoplasm of transverse colon: Secondary | ICD-10-CM | POA: Insufficient documentation

## 2022-02-13 DIAGNOSIS — K5730 Diverticulosis of large intestine without perforation or abscess without bleeding: Secondary | ICD-10-CM | POA: Diagnosis not present

## 2022-02-13 DIAGNOSIS — J45909 Unspecified asthma, uncomplicated: Secondary | ICD-10-CM | POA: Insufficient documentation

## 2022-02-13 DIAGNOSIS — F419 Anxiety disorder, unspecified: Secondary | ICD-10-CM | POA: Insufficient documentation

## 2022-02-13 HISTORY — PX: COLONOSCOPY WITH PROPOFOL: SHX5780

## 2022-02-13 HISTORY — PX: POLYPECTOMY: SHX5525

## 2022-02-13 LAB — GLUCOSE, CAPILLARY: Glucose-Capillary: 168 mg/dL — ABNORMAL HIGH (ref 70–99)

## 2022-02-13 LAB — HM COLONOSCOPY

## 2022-02-13 SURGERY — COLONOSCOPY WITH PROPOFOL
Anesthesia: General

## 2022-02-13 MED ORDER — LIDOCAINE HCL (CARDIAC) PF 100 MG/5ML IV SOSY
PREFILLED_SYRINGE | INTRAVENOUS | Status: DC | PRN
  Administered 2022-02-13: 50 mg via INTRAVENOUS

## 2022-02-13 MED ORDER — PROPOFOL 500 MG/50ML IV EMUL
INTRAVENOUS | Status: DC | PRN
  Administered 2022-02-13: 150 ug/kg/min via INTRAVENOUS

## 2022-02-13 MED ORDER — PHENYLEPHRINE HCL (PRESSORS) 10 MG/ML IV SOLN
INTRAVENOUS | Status: DC | PRN
  Administered 2022-02-13: 80 ug via INTRAVENOUS
  Administered 2022-02-13 (×2): 160 ug via INTRAVENOUS
  Administered 2022-02-13: 80 ug via INTRAVENOUS
  Administered 2022-02-13 (×2): 160 ug via INTRAVENOUS
  Administered 2022-02-13: 80 ug via INTRAVENOUS

## 2022-02-13 MED ORDER — LACTATED RINGERS IV SOLN: INTRAVENOUS | Status: DC

## 2022-02-13 MED ORDER — PROPOFOL 10 MG/ML IV BOLUS
INTRAVENOUS | Status: DC | PRN
  Administered 2022-02-13: 40 mg via INTRAVENOUS

## 2022-02-13 NOTE — Anesthesia Preprocedure Evaluation (Signed)
Anesthesia Evaluation  Patient identified by MRN, date of birth, ID band Patient awake    Reviewed: Allergy & Precautions, H&P , NPO status , Patient's Chart, lab work & pertinent test results, reviewed documented beta blocker date and time   Airway Mallampati: II  TM Distance: >3 FB Neck ROM: full    Dental no notable dental hx.    Pulmonary asthma , former smoker   Pulmonary exam normal breath sounds clear to auscultation       Cardiovascular Exercise Tolerance: Good hypertension, +CHF   Rhythm:regular Rate:Normal     Neuro/Psych  PSYCHIATRIC DISORDERS Anxiety Depression    negative neurological ROS     GI/Hepatic Neg liver ROS,GERD  Medicated,,  Endo/Other  negative endocrine ROSdiabetes, Type 2    Renal/GU CRFRenal disease  negative genitourinary   Musculoskeletal   Abdominal   Peds  Hematology negative hematology ROS (+)   Anesthesia Other Findings  1. Left ventricular ejection fraction, by estimation, is 45 to 50%. The  left ventricle has mildly decreased function. The left ventricle  demonstrates global hypokinesis. Left ventricular diastolic parameters are  consistent with Grade I diastolic  dysfunction (impaired relaxation).   2. Right ventricular systolic function is normal. The right ventricular  size is normal.   3. There is a trivial pericardial effusion posterior to the left  ventricle.   4. The mitral valve is grossly normal. No evidence of mitral valve  regurgitation.   5. The aortic valve is tricuspid. Aortic valve regurgitation is not  visualized.   6. The inferior vena cava is normal in size with greater than 50%  respiratory variability, suggesting right atrial pressure of 3 mmHg.    Reproductive/Obstetrics negative OB ROS                             Anesthesia Physical Anesthesia Plan  ASA: 3  Anesthesia Plan: General   Post-op Pain Management:     Induction:   PONV Risk Score and Plan: Propofol infusion  Airway Management Planned:   Additional Equipment:   Intra-op Plan:   Post-operative Plan:   Informed Consent: I have reviewed the patients History and Physical, chart, labs and discussed the procedure including the risks, benefits and alternatives for the proposed anesthesia with the patient or authorized representative who has indicated his/her understanding and acceptance.     Dental Advisory Given  Plan Discussed with: CRNA  Anesthesia Plan Comments:        Anesthesia Quick Evaluation

## 2022-02-13 NOTE — Transfer of Care (Signed)
Immediate Anesthesia Transfer of Care Note  Patient: [name]  Procedure(s) Performed: COLONOSCOPY WITH PROPOFOL POLYPECTOMY  Patient Location: short stay  Anesthesia Type:General  Level of Consciousness: awake, alert , oriented, and patient cooperative  Airway & Oxygen Therapy: Patient Spontanous Breathing  Post-op Assessment: Report given to RN, Post -op Vital signs reviewed and stable, and Patient moving all extremities X 4  Post vital signs: Reviewed and stable  Last Vitals:  Vitals Value Taken Time  BP 97/58 02/13/22 1119  Temp 36.6 C 02/13/22 1119  Pulse 65 02/13/22 1119  Resp 16 02/13/22 1119  SpO2 99 % 02/13/22 1119    Last Pain:  Vitals:   02/13/22 1119  TempSrc: Oral  PainSc: 0-No pain         Complications: No notable events documented.

## 2022-02-13 NOTE — Discharge Instructions (Signed)
You are being discharged to home.  Resume your previous diet.  We are waiting for your pathology results.  Your physician has recommended a repeat colonoscopy (date to be determined after pending pathology results are reviewed) for surveillance.

## 2022-02-13 NOTE — H&P (Signed)
[name] is an 61 y.o. female.   Chief Complaint: History of colonic polyps HPI: 61 year old female with past medical history of systolic heart failure, depression, hypertension, GERD, hyperlipidemia, diabetes, coming for history of colonic polyps.  Last colonoscopy in 2018, had 2 tubular adenoma removed.  The patient denies having any complaints such as melena, hematochezia, abdominal pain or distention, change in her bowel movement consistency or frequency, no changes in weight recently.  No family history of colorectal cancer.   Past Medical History:  Diagnosis Date   Anal fissure    Anxiety    Chronic systolic (congestive) heart failure ([place])    a. 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54   Mixed hyperlipidemia    Nonischemic cardiomyopathy ([place])    Type 2 diabetes mellitus ([place])     Past Surgical History:  Procedure Laterality Date   BACK SURGERY  2005   lumbar disckectomy   COLONOSCOPY  10/01/2011   Procedure: COLONOSCOPY;  Surgeon: [name], MD;  [facility]: [facility];  Service: Endoscopy;  Laterality: N/A;  730   COLONOSCOPY N/A 10/04/2016   Procedure: COLONOSCOPY;  Surgeon: [name], MD;  Location: [facility];  Service: Endoscopy;  Laterality: N/A;  10:30   DILATION AND CURETTAGE OF UTERUS     FOOT BONE EXCISION     right   POLYPECTOMY  10/04/2016   Procedure: POLYPECTOMY;  Surgeon: [name], MD;  Location: [facility];  Service: Endoscopy;;  colon   RIGHT/LEFT HEART CATH AND CORONARY ANGIOGRAPHY N/A 03/23/2019   Procedure: RIGHT/LEFT HEART CATH AND CORONARY ANGIOGRAPHY;  Surgeon: [name], MD;  Location: [facility];  Service: Cardiovascular;  Laterality: N/A;    Family History  Problem Relation Age of Onset   Arthritis Mother    Hernia Mother    Constipation  Mother    Heart failure Father        Underwent heart transplant   Colon cancer Neg Hx    Social History:  reports that she quit smoking about 2 months ago. Her smoking use included cigarettes. She has a 9.25 pack-year smoking history. She has never been exposed to tobacco smoke. She has never used smokeless tobacco. She reports that she does not drink alcohol and does not use drugs.  Allergies:  Allergies  Allergen Reactions   Metformin     Causes GI issues    Medications Prior to Admission  Medication Sig Dispense Refill   aspirin EC 81 MG tablet Take 81 mg by mouth daily. Swallow whole.     atorvastatin (LIPITOR) 80 MG tablet Take 1 tablet (80 mg total) by mouth daily. 30 tablet 6   busPIRone (BUSPAR) 7.5 MG tablet Take 7.5 mg by mouth 2 (two) times daily.     carvedilol (COREG) 6.25 MG tablet Take 1 tablet (6.25 mg total) by mouth 2 (two) times daily with a meal. TAKE 1.5 TABLETS BY MOUTH TWICE A DAY WITH A MEAL (Patient taking differently: Take 6.25 mg by mouth 2 (two) times daily with a meal.) 270 tablet 3   Cholecalciferol (VITAMIN D3) 50 MCG (2000 UT) capsule Take 2,000 Units by mouth daily.     diclofenac Sodium (VOLTAREN) 1 % GEL Apply 1 application  topically 4 (four)  times daily as needed (pain).     ENTRESTO 24-26 MG TAKE ONE TABLET BY MOUTH TWICE A DAY 60 tablet 11   FARXIGA 10 MG TABS tablet TAKE ONE TABLET (10MG TOTAL) BY MOUTH DAILY 30 tablet 11   fenofibrate (TRICOR) 145 MG tablet Take 1 tablet (145 mg total) by mouth daily. 30 tablet 6   FLUoxetine (PROZAC) 40 MG capsule Take 40 mg by mouth daily.     fluticasone (FLONASE) 50 MCG/ACT nasal spray Place 1 spray into both nostrils daily.     glipiZIDE (GLUCOTROL) 5 MG tablet Take 0.5 tablets (2.5 mg total) by mouth daily before breakfast. (Patient taking differently: Take 5 mg by mouth daily before breakfast.) 90 tablet 3   levocetirizine (XYZAL) 5 MG tablet Take 5 mg by mouth every evening.     loperamide (IMODIUM) 2 MG  capsule TAKE 2 CAPSULES BY MOUTH INITIALLY, THEN TAKE 1 CAPSULE AFTER EACH LOOSE STOOL. DO NOT EXCEED 8 CAPSULES IN 24 HOURS. 60 capsule 2   pantoprazole (PROTONIX) 40 MG tablet Take 1 tablet (40 mg total) by mouth daily. 90 tablet 3   polyethylene glycol-electrolytes (TRILYTE) 420 g solution Take 4,000 mLs by mouth as directed. 4000 mL 0   spironolactone (ALDACTONE) 25 MG tablet TAKE ONE TABLET (25MG TOTAL) BY MOUTH DAILY 90 tablet 3   varenicline (CHANTIX) 1 MG tablet Take 1 tablet (1 mg total) by mouth 2 (two) times daily. 180 tablet 0   zolpidem (AMBIEN) 10 MG tablet Take 10 mg by mouth at bedtime as needed for sleep.     Accu-Chek FastClix Lancets MISC USE TO CHECK TWICE DAILY. 100 each 2   ACCU-CHEK GUIDE test strip Use as instructed to monitor glucose twice daily 400 each 3   Blood Glucose Monitoring Suppl (ACCU-CHEK GUIDE ME) w/Device KIT Use to monitor blood glucose twice daily, before breakfast and before bed. 1 kit 1    Results for orders placed or performed during the hospital encounter of 02/13/22 (from the past 48 hour(s))  Glucose, capillary     Status: Abnormal   Collection Time: 02/13/22  8:30 AM  Result Value Ref Range   Glucose-Capillary 168 (H) 70 - 99 mg/dL    Comment: Glucose reference range applies only to samples taken after fasting for at least 8 hours.   No results found.  Review of Systems  All other systems reviewed and are negative.   Blood pressure 117/67, pulse 71, temperature 97.9 F (36.6 C), temperature source Oral, resp. rate (!) 9, height _0  (1.702 m), weight 80.2 kg, last menstrual period 09/25/2011, SpO2 100 %. Physical Exam  GENERAL: The patient is AO x3, in no acute distress. HEENT: Head is normocephalic and atraumatic. EOMI are intact. Mouth is well hydrated and without lesions. NECK: Supple. No masses LUNGS: Clear to auscultation. No presence of rhonchi/wheezing/rales. Adequate chest expansion HEART: RRR, normal s1 and s2. ABDOMEN: Soft,  nontender, no guarding, no peritoneal signs, and nondistended. BS +. No masses. EXTREMITIES: Without any cyanosis, clubbing, rash, lesions or edema. NEUROLOGIC: AOx3, no focal motor deficit. SKIN: no jaundice, no rashes  Assessment/Plan 61 year old female with past medical history of systolic heart failure, depression, hypertension, GERD, hyperlipidemia, diabetes, coming for history of colonic polyps.  Will proceed with colonoscopy.  Harvel Quale, MD 02/13/2022, 9:10 AM

## 2022-02-13 NOTE — Op Note (Signed)
[facility] Patient Name: [name] Procedure Date: 02/13/2022 10:26 AM MRN: [id number] Date of Birth: [birth year] Attending MD: Maylon Peppers , , [id number] CSN: [id number] Age: 61 Admit Type: Outpatient Procedure:                Colonoscopy Indications:              Surveillance: Personal history of adenomatous                            polyps on last colonoscopy 5 years ago Providers:                Maylon Peppers, [place] Page, [place] Risa Grill, Technician Referring MD:              Medicines:                Monitored Anesthesia Care Complications:            No immediate complications. Estimated Blood Loss:     Estimated blood loss: none. Procedure:                Pre-Anesthesia Assessment:                           - Prior to the procedure, a History and Physical                            was performed, and patient medications, allergies                            and sensitivities were reviewed. The patient's                            tolerance of previous anesthesia was reviewed.                           - The risks and benefits of the procedure and the                            sedation options and risks were discussed with the                            patient. All questions were answered and informed                            consent was obtained.                           - ASA Grade Assessment: III - A patient with severe                            systemic disease.                           After obtaining informed consent, the colonoscope  was passed under direct vision. Throughout the                            procedure, the patient's blood pressure, pulse, and                            oxygen saturations were monitored continuously. The                            [id number]-HQ190L ([id number]) scope was introduced through                            the anus and advanced to the the cecum, identified                             by appendiceal orifice and ileocecal valve. The                            colonoscopy was performed without difficulty. The                            patient tolerated the procedure well. The quality                            of the bowel preparation was good. Scope In: 10:41:16 AM Scope Out: 11:16:30 AM Scope Withdrawal Time: 0 hours 27 minutes 2 seconds  Total Procedure Duration: 0 hours 35 minutes 14 seconds  Findings:      The perianal and digital rectal examinations were normal.      Seven sessile polyps were found in the transverse colon and ascending       colon. The polyps were 3 to 8 mm in size. These polyps were removed with       a cold snare. Resection and retrieval were complete.      Five sessile and semi-pedunculated polyps were found in the sigmoid       colon and descending colon. The polyps were 3 to 10 mm in size. These       polyps were removed with a cold snare. Resection and retrieval were       complete.      Scattered large-mouthed and small-mouthed diverticula were found in the       sigmoid colon and descending colon.      Non-bleeding internal hemorrhoids were found during retroflexion. The       hemorrhoids were small. Impression:               - Seven 3 to 8 mm polyps in the transverse colon                            and in the ascending colon, removed with a cold                            snare. Resected and retrieved.                           - Five 3 to  10 mm polyps in the sigmoid colon and                            in the descending colon, removed with a cold snare.                            Resected and retrieved.                           - Diverticulosis in the sigmoid colon and in the                            descending colon.                           - Non-bleeding internal hemorrhoids. Moderate Sedation:      Per Anesthesia Care Recommendation:           - Discharge patient to home (ambulatory).                            - Resume previous diet.                           - Await pathology results.                           - Repeat colonoscopy date to be determined after                            pending pathology results are reviewed for                            surveillance. Procedure Code(s):        --- Professional ---                           [phone], Colonoscopy, flexible; with removal of                            tumor(s), polyp(s), or other lesion(s) by snare                            technique Diagnosis Code(s):        --- Professional ---                           Z86.010, Personal history of colonic polyps                           D12.3, Benign neoplasm of transverse colon (hepatic                            flexure or splenic flexure)                           D12.2, Benign neoplasm of ascending colon  D12.5, Benign neoplasm of sigmoid colon                           D12.4, Benign neoplasm of descending colon                           K64.8, Other hemorrhoids                           K57.30, Diverticulosis of large intestine without                            perforation or abscess without bleeding CPT copyright 2022 [employer]. All rights reserved. The codes documented in this report are preliminary and upon coder review may  be revised to meet current compliance requirements. Maylon Peppers, MD Maylon Peppers,  02/13/2022 11:24:18 AM This report has been signed electronically. Number of Addenda: 0

## 2022-02-14 ENCOUNTER — Encounter (INDEPENDENT_AMBULATORY_CARE_PROVIDER_SITE_OTHER): Payer: Self-pay | Admitting: *Deleted

## 2022-02-14 LAB — SURGICAL PATHOLOGY

## 2022-02-15 ENCOUNTER — Encounter (HOSPITAL_COMMUNITY): Payer: Self-pay | Admitting: Cardiology

## 2022-02-15 NOTE — Anesthesia Postprocedure Evaluation (Signed)
Anesthesia Post Note  Patient: [name]  Procedure(s) Performed: COLONOSCOPY WITH PROPOFOL POLYPECTOMY  Patient location during evaluation: Phase II Anesthesia Type: General Level of consciousness: awake Pain management: pain level controlled Vital Signs Assessment: post-procedure vital signs reviewed and stable Respiratory status: spontaneous breathing and respiratory function stable Cardiovascular status: blood pressure returned to baseline and stable Postop Assessment: no headache and no apparent nausea or vomiting Anesthetic complications: no Comments: Late entry   No notable events documented.   Last Vitals:  Vitals:   02/13/22 0814 02/13/22 1119  BP: 117/67 (!) 97/58  Pulse: 71 65  Resp: (!) 9 16  Temp: 36.6 C 36.6 C  SpO2: 100% 99%    Last Pain:  Vitals:   02/13/22 1119  TempSrc: Oral  PainSc: 0-No pain                 Louann Sjogren

## 2022-02-16 MED ORDER — BLOOD PRESSURE CUFF MISC: 1.0000 | 0 refills | Status: AC

## 2022-02-21 ENCOUNTER — Ambulatory Visit (INDEPENDENT_AMBULATORY_CARE_PROVIDER_SITE_OTHER): Payer: Medicaid Other | Admitting: Nurse Practitioner

## 2022-02-21 ENCOUNTER — Encounter: Payer: Self-pay | Admitting: Nurse Practitioner

## 2022-02-21 VITALS — BP 92/60 | HR 80 | Ht 67.0 in | Wt 170.4 lb

## 2022-02-21 DIAGNOSIS — I10 Essential (primary) hypertension: Secondary | ICD-10-CM

## 2022-02-21 DIAGNOSIS — N1831 Chronic kidney disease, stage 3a: Secondary | ICD-10-CM

## 2022-02-21 DIAGNOSIS — E1122 Type 2 diabetes mellitus with diabetic chronic kidney disease: Secondary | ICD-10-CM

## 2022-02-21 DIAGNOSIS — E782 Mixed hyperlipidemia: Secondary | ICD-10-CM | POA: Diagnosis not present

## 2022-02-21 LAB — POCT GLYCOSYLATED HEMOGLOBIN (HGB A1C): Hemoglobin A1C: 6.8 % — AB (ref 4.0–5.6)

## 2022-02-21 MED ORDER — GLIPIZIDE 5 MG PO TABS: 5.0000 mg | ORAL_TABLET | Freq: Every day | ORAL | 3 refills | Status: DC

## 2022-02-21 NOTE — Progress Notes (Signed)
02/21/2022, 9:37 AM  Endocrinology follow-up note   Subjective:    Patient ID: [name], female    DOB: [birth year].  SPRUHA WEIGHT is being seen in follow-up after she was seen in consultation for management of currently uncontrolled symptomatic diabetes requested by  Luciano Cutter, DO.   Past Medical History:  Diagnosis Date   Anal fissure    Anxiety    Chronic systolic (congestive) heart failure ([place])    a. 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45   Mixed hyperlipidemia    Nonischemic cardiomyopathy ([place])    Type 2 diabetes mellitus ([place])     Past Surgical History:  Procedure Laterality Date   BACK SURGERY  2005   lumbar disckectomy   COLONOSCOPY  10/01/2011   Procedure: COLONOSCOPY;  Surgeon: [name], MD;  [facility];  Service: Endoscopy;  Laterality: N/A;  730   COLONOSCOPY N/A 10/04/2016   Procedure: COLONOSCOPY;  Surgeon: [name], MD;  [facility];  Service: Endoscopy;  Laterality: N/A;  10:30   DILATION AND CURETTAGE OF UTERUS     FOOT BONE EXCISION     right   POLYPECTOMY  10/04/2016   Procedure: POLYPECTOMY;  Surgeon: [name], MD;  [facility];  Service: Endoscopy;;  colon   RIGHT/LEFT HEART CATH AND CORONARY ANGIOGRAPHY N/A 03/23/2019   Procedure: RIGHT/LEFT HEART CATH AND CORONARY ANGIOGRAPHY;  Surgeon: [name], MD;  Location: [facility];  Service: Cardiovascular;  Laterality: N/A;    Social History   Socioeconomic History   Marital status: Divorced    Spouse name: Not on file   Number of children: Not on file   Years of education: Not on file   Highest education level: Not on file  Occupational History   Not on file  Tobacco Use   Smoking status:  Former    Packs/day: 0.25    Years: 37.00    Total pack years: 9.25    Types: Cigarettes    Quit date: 11/19/2021    Years since quitting: 0.2    Passive exposure: Never   Smokeless tobacco: Never   Tobacco comments:    1-2 cigarettes daily  Vaping Use   Vaping Use: Never used  Substance and Sexual Activity   Alcohol use: No   Drug use: No   Sexual activity: Yes    Birth control/protection: None  Other Topics Concern   Not on file  Social History Narrative   Not on file   Social Determinants of Health   Financial Resource Strain: Unknown (04/30/2018)   Overall Financial Resource Strain (CARDIA)    Difficulty of Paying Living Expenses: Patient refused  Food Insecurity: Unknown (04/30/2018)   Hunger Vital Sign    Worried About Running Out of Food in the Last Year: Patient refused    [place] in the Last Year: Patient refused  Transportation Needs: Unknown (04/30/2018)   PRAPARE - Transportation  Lack of Transportation (Medical): Patient refused    Lack of Transportation (Non-Medical): Patient refused  Physical Activity: Unknown (04/30/2018)   Exercise Vital Sign    Days of Exercise per Week: Patient refused    Minutes of Exercise per Session: Patient refused  Stress: Unknown (04/30/2018)   [place]    Feeling of Stress : Patient refused  Social Connections: Unknown (04/30/2018)   Social Connection and Isolation Panel [NHANES]    Frequency of Communication with Friends and Family: Patient refused    Frequency of Social Gatherings with Friends and Family: Patient refused    Attends Religious Services: Patient refused    Marine scientist or Organizations: Patient refused    Attends Archivist Meetings: Patient refused    Marital Status: Patient refused    Family History  Problem Relation Age of Onset   Arthritis Mother    Hernia Mother    Constipation Mother    Heart failure Father         Underwent heart transplant   Colon cancer Neg Hx     Outpatient Encounter Medications as of 02/21/2022  Medication Sig   Accu-Chek FastClix Lancets MISC USE TO CHECK TWICE DAILY.   ACCU-CHEK GUIDE test strip Use as instructed to monitor glucose twice daily   aspirin EC 81 MG tablet Take 81 mg by mouth daily. Swallow whole.   atorvastatin (LIPITOR) 80 MG tablet Take 1 tablet (80 mg total) by mouth daily.   Blood Glucose Monitoring Suppl (ACCU-CHEK GUIDE ME) w/Device KIT Use to monitor blood glucose twice daily, before breakfast and before bed.   Blood Pressure Monitoring (BLOOD PRESSURE CUFF) MISC 1 Device by Does not apply route as directed. To monitor blood pressure as needed daily dx: I50.9   busPIRone (BUSPAR) 7.5 MG tablet Take 7.5 mg by mouth 2 (two) times daily.   carvedilol (COREG) 6.25 MG tablet Take 1 tablet (6.25 mg total) by mouth 2 (two) times daily with a meal. TAKE 1.5 TABLETS BY MOUTH TWICE A DAY WITH A MEAL (Patient taking differently: Take 6.25 mg by mouth 2 (two) times daily with a meal.)   Cholecalciferol (VITAMIN D3) 50 MCG (2000 UT) capsule Take 2,000 Units by mouth daily.   diclofenac Sodium (VOLTAREN) 1 % GEL Apply 1 application  topically 4 (four) times daily as needed (pain).   ENTRESTO 24-26 MG TAKE ONE TABLET BY MOUTH TWICE A DAY   FARXIGA 10 MG TABS tablet TAKE ONE TABLET (10MG TOTAL) BY MOUTH DAILY   fenofibrate (TRICOR) 145 MG tablet Take 1 tablet (145 mg total) by mouth daily.   FLUoxetine (PROZAC) 40 MG capsule Take 40 mg by mouth daily.   fluticasone (FLONASE) 50 MCG/ACT nasal spray Place 1 spray into both nostrils daily.   levocetirizine (XYZAL) 5 MG tablet Take 5 mg by mouth every evening.   loperamide (IMODIUM) 2 MG capsule TAKE 2 CAPSULES BY MOUTH INITIALLY, THEN TAKE 1 CAPSULE AFTER EACH LOOSE STOOL. DO NOT EXCEED 8 CAPSULES IN 24 HOURS.   pantoprazole (PROTONIX) 40 MG tablet Take 1 tablet (40 mg total) by mouth daily.   spironolactone (ALDACTONE) 25  MG tablet TAKE ONE TABLET (25MG TOTAL) BY MOUTH DAILY   varenicline (CHANTIX) 1 MG tablet Take 1 tablet (1 mg total) by mouth 2 (two) times daily.   zolpidem (AMBIEN) 10 MG tablet Take 10 mg by mouth at bedtime as needed for sleep.   [DISCONTINUED] glipiZIDE (GLUCOTROL) 5  MG tablet Take 0.5 tablets (2.5 mg total) by mouth daily before breakfast. (Patient taking differently: Take 5 mg by mouth daily before breakfast.)   glipiZIDE (GLUCOTROL) 5 MG tablet Take 1 tablet (5 mg total) by mouth daily before breakfast.   No facility-administered encounter medications on file as of 02/21/2022.    ALLERGIES: Allergies  Allergen Reactions   Metformin     Causes GI issues    VACCINATION STATUS:  There is no immunization history on file for this patient.  Diabetes She presents for her follow-up diabetic visit. She has type 2 diabetes mellitus. Onset time: She was diagnosed at approximate age of 73 years. Her disease course has been improving. There are no hypoglycemic associated symptoms. Pertinent negatives for hypoglycemia include no confusion, headaches, pallor or seizures. Associated symptoms include fatigue. Pertinent negatives for diabetes include no chest pain, no polydipsia, no polyphagia and no polyuria. There are no hypoglycemic complications. Symptoms are stable. Diabetic complications include heart disease and nephropathy. Risk factors for coronary artery disease include dyslipidemia, diabetes mellitus, obesity, sedentary lifestyle, post-menopausal and tobacco exposure. Current diabetic treatment includes oral agent (dual therapy). She is compliant with treatment most of the time. Her weight is fluctuating minimally. She is following a generally unhealthy diet. When asked about meal planning, she reported none. She has not had a previous visit with a dietitian. She rarely participates in exercise. Her home blood glucose trend is decreasing steadily. Her breakfast blood glucose range is generally  130-140 mg/dl. Her bedtime blood glucose range is generally 140-180 mg/dl. Her overall blood glucose range is 130-140 mg/dl. (She presents today with her meter and logs showing stable, at goal glycemic profile overall.  Her POCT A1c today is 6.8%, improving from last visit of 7.4%.  She has been taking the full pill of the Glipizide instead of cutting it in half due to misunderstanding.  She denies any hypoglycemia.  Analysis of her meter shows 7-day average of 151, 14-day average of 148, 30-day average of 144, 90-day average of 139.  She does admit to over indulging over the holidays.) An ACE inhibitor/angiotensin II receptor blocker is being taken. Eye exam is current.  Hyperlipidemia This is a chronic problem. The current episode started more than 1 year ago. The problem is uncontrolled. Recent lipid tests were reviewed and are high. Exacerbating diseases include chronic renal disease, diabetes and obesity. Factors aggravating her hyperlipidemia include fatty foods, beta blockers and smoking. Pertinent negatives include no chest pain, myalgias or shortness of breath. Current antihyperlipidemic treatment includes statins and fibric acid derivatives. The current treatment provides mild improvement of lipids. Compliance problems include adherence to diet, adherence to exercise and psychosocial issues.  Risk factors for coronary artery disease include dyslipidemia, diabetes mellitus, obesity, a sedentary lifestyle and post-menopausal.  Hypertension This is a chronic problem. The current episode started more than 1 year ago. The problem has been resolved since onset. The problem is controlled. Pertinent negatives include no chest pain, headaches, palpitations or shortness of breath. There are no associated agents to hypertension. Risk factors for coronary artery disease include diabetes mellitus, dyslipidemia, obesity, smoking/tobacco exposure, sedentary lifestyle and post-menopausal state. Past treatments include  ACE inhibitors, beta blockers, angiotensin blockers and diuretics. The current treatment provides significant improvement. Compliance problems include diet and exercise.  Hypertensive end-organ damage includes kidney disease and heart failure. Identifiable causes of hypertension include chronic renal disease.    Review of systems  Constitutional: + Minimally fluctuating body weight,  current Body mass  index is 26.69 kg/m. , no fatigue, no subjective hyperthermia, no subjective hypothermia Eyes: no blurry vision, no xerophthalmia ENT: no sore throat, no nodules palpated in throat, no dysphagia/odynophagia, no hoarseness Cardiovascular: no chest pain, no shortness of breath, no palpitations, no leg swelling Respiratory: no cough, no shortness of breath Gastrointestinal: no nausea/vomiting/diarrhea Musculoskeletal: no muscle/joint aches Skin: no rashes, no hyperemia Neurological: no tremors, no numbness, no tingling, no dizziness Psychiatric: no depression, no anxiety   Objective:    BP 92/60 (BP Location: Left Arm, Patient Position: Sitting, Cuff Size: Normal)   Pulse 80   Ht _0  (1.702 m)   Wt 170 lb 6.4 oz (77.3 kg)   LMP 09/25/2011   BMI 26.69 kg/m   Wt Readings from Last 3 Encounters:  02/21/22 170 lb 6.4 oz (77.3 kg)  02/13/22 176 lb 12.9 oz (80.2 kg)  02/08/22 176 lb 12.9 oz (80.2 kg)    BP Readings from Last 3 Encounters:  02/21/22 92/60  02/13/22 (!) 97/58  02/08/22 (!) 114/53     Physical Exam- Limited  Constitutional:  Body mass index is 26.69 kg/m. , not in acute distress, normal state of mind Eyes:  EOMI, no exophthalmos Musculoskeletal: no gross deformities, strength intact in all four extremities, no gross restriction of joint movements Skin:  no rashes, no hyperemia Neurological: no tremor with outstretched hands  Diabetic Foot Exam - Simple   Simple Foot Form Diabetic Foot exam was performed with the following findings: Yes 02/21/2022  9:31 AM   Visual Inspection See comments: Yes Sensation Testing Intact to touch and monofilament testing bilaterally: Yes Pulse Check Posterior Tibialis and Dorsalis pulse intact bilaterally: Yes Comments Hammertoe deformity to digits 2-5 bilaterally, calluses noted bilaterally     CMP ( most recent) CMP     Component Value Date/Time   NA 138 02/08/2022 0910   NA 139 07/15/2019 0825   K 3.3 (L) 02/08/2022 0910   CL 107 02/08/2022 0910   CO2 21 (L) 02/08/2022 0910   GLUCOSE 182 (H) 02/08/2022 0910   BUN 21 (H) 02/08/2022 0910   BUN 27 (H) 07/15/2019 0825   CREATININE 1.25 (H) 02/08/2022 0910   CREATININE 1.12 (H) 06/05/2019 0710   CALCIUM 9.4 02/08/2022 0910   PROT 6.5 09/29/2021 1237   ALBUMIN 4.2 09/29/2021 1237   AST 17 09/29/2021 1237   ALT 16 09/29/2021 1237   ALKPHOS 78 09/29/2021 1237   BILITOT 0.5 09/29/2021 1237   GFRNONAA 49 (L) 02/08/2022 0910   GFRAA 54 (L) 10/02/2019 1228     Diabetic Labs (most recent): Lab Results  Component Value Date   HGBA1C 6.8 (A) 02/21/2022   HGBA1C 7.4 08/22/2021   HGBA1C 6.9 02/06/2021   MICROALBUR 10 08/04/2020    Assessment & Plan:   1) Diabetes mellitus with stage 3 chronic kidney disease ([place])  - RICK WARNICK has currently uncontrolled symptomatic type 2 DM since  61 years of age.  She presents today with her meter and logs showing stable, at goal glycemic profile overall.  Her POCT A1c today is 6.8%, improving from last visit of 7.4%.  She has been taking the full pill of the Glipizide instead of cutting it in half due to misunderstanding.  She denies any hypoglycemia.  Analysis of her meter shows 7-day average of 151, 14-day average of 148, 30-day average of 144, 90-day average of 139.  She does admit to over indulging over the holidays.  Recent labs reviewed showing slightly worse  renal function- has upcoming appt with nephrology soon.  - I had a long discussion with her about the progressive nature of diabetes and the  pathology behind its complications. -her diabetes is complicated by CHF, CKD stage 3b , smoking and she remains at a high risk for more acute and chronic complications which include CAD, CVA, CKD, retinopathy, and neuropathy. These are all discussed in detail with her.  - Nutritional counseling repeated at each appointment due to patients tendency to fall back in to old habits.  - The patient admits there is a room for improvement in their diet and drink choices. -  Suggestion is made for the patient to avoid simple carbohydrates from their diet including Cakes, Sweet Desserts / Pastries, Ice Cream, Soda (diet and regular), Sweet Tea, Candies, Chips, Cookies, Sweet Pastries, Store Bought Juices, Alcohol in Excess of 1-2 drinks a day, Artificial Sweeteners, Coffee Creamer, and "Sugar-free" Products. This will help patient to have stable blood glucose profile and potentially avoid unintended weight gain.   - I encouraged the patient to switch to unprocessed or minimally processed complex starch and increased protein intake (animal or plant source), fruits, and vegetables.   - Patient is advised to stick to a routine mealtimes to eat 3 meals a day and avoid unnecessary snacks (to snack only to correct hypoglycemia).  - I have approached her with the following individualized plan to manage her diabetes and patient agrees:   - She did not tolerate Metformin.  -She is advised to continue her Farxiga 10 mg po daily (as prescribed by cardiology) and her Glipizide 5 mg PO daily with breakfast.  May decrease again on future visit if experiences any hypoglycemia.  -She is encouraged to continue monitoring blood glucose twice daily, before breakfast and before bed, and to call the clinic if she has readings less than 70 or greater than 200 for 3 tests in a row.    - she is not a suitable candidate for incretin therapy either due to her body habitus as well as chronic smoking history, posing risk for  pancreatitis.  -Addition of basal insulin would be the safest option if she loses control on subsequent visits.  - Specific targets for  A1c;  LDL, HDL,  and Triglycerides were discussed with the patient.  2) Blood Pressure /Hypertension:  -Her blood pressure is controlled to target- low today.   she is advised to continue her current medications including Entresto 24/26 mg p.o. daily with breakfast, Carvedilol 6.25 mg p.o. twice daily as well as Spironolactone 25 mg p.o. daily. Med changes will be deferred to her cardiologist/nephrologist.  3) Lipids/Hyperlipidemia:    Her most recent lipid panel from 12/01/21 shows controlled LDL of 70.  She is advised to continue Atorvastatin 80 mg po daily at bedtime and Fenofibrate 48 mg po daily.  Side effects and precautions discussed with her.  4)  Weight/Diet:  Her Body mass index is 26.69 kg/m.  -She is a candidate for some weight loss. I discussed with her the fact that loss of 5 - 10% of her  current body weight will have the most impact on her diabetes management.  Exercise, and detailed carbohydrates information provided  -  detailed on discharge instructions.  5) Chronic Care/Health Maintenance: -she is on ACEI/ARB and Statin medications and  is encouraged to initiate and continue to follow up with Ophthalmology, Dentist, orthopedic surgeon, cardiology, podiatrist at least yearly or according to recommendations, and advised to  quit smoking. I  have recommended yearly flu vaccine and pneumonia vaccine at least every 5 years; moderate intensity exercise for up to 150 minutes weekly; and  sleep for at least 7 hours a day.   The patient was counseled on the dangers of tobacco use, and was advised to quit.  Reviewed strategies to maximize success, including removing cigarettes and smoking materials from environment.   - she is advised to maintain close follow up with Luciano Cutter, DO for primary care needs, as well as her other providers for optimal  and coordinated care.      I spent 30 minutes in the care of the patient today including review of labs from [facility], Lipids, Thyroid Function, Hematology (current and previous including abstractions from other facilities); face-to-face time discussing  her blood glucose readings/logs, discussing hypoglycemia and hyperglycemia episodes and symptoms, medications doses, her options of short and long term treatment based on the latest standards of care / guidelines;  discussion about incorporating lifestyle medicine;  and documenting the encounter. Risk reduction counseling performed per USPSTF guidelines to reduce obesity and cardiovascular risk factors.     Please refer to Patient Instructions for Blood Glucose Monitoring and Insulin/Medications Dosing Guide"  in media tab for additional information. Please  also refer to " Patient Self Inventory" in the Media  tab for reviewed elements of pertinent patient history.  Orbie Pyo participated in the discussions, expressed understanding, and voiced agreement with the above plans.  All questions were answered to her satisfaction. she is encouraged to contact clinic should she have any questions or concerns prior to her return visit.     Follow up plan: - Return in about 6 months (around 08/23/2022) for Diabetes F/U with A1c in office, No previsit labs, Bring meter and logs.  Rayetta Pigg, [facility] [address] [place], [place] [address] Phone: [phone] Fax: [phone]  02/21/2022, 9:37 AM

## 2022-02-22 ENCOUNTER — Other Ambulatory Visit: Payer: Self-pay | Admitting: Nurse Practitioner

## 2022-03-23 ENCOUNTER — Other Ambulatory Visit: Payer: Self-pay | Admitting: Nurse Practitioner

## 2022-04-06 ENCOUNTER — Other Ambulatory Visit (HOSPITAL_COMMUNITY): Payer: Self-pay

## 2022-04-06 ENCOUNTER — Ambulatory Visit (HOSPITAL_COMMUNITY)
Admission: RE | Admit: 2022-04-06 | Discharge: 2022-04-06 | Disposition: A | Discharge status: Home / Self Care | Payer: Medicaid Other | Source: Ambulatory Visit | Attending: Cardiology | Admitting: Cardiology

## 2022-04-06 ENCOUNTER — Encounter (HOSPITAL_COMMUNITY): Payer: Self-pay | Admitting: Cardiology

## 2022-04-06 VITALS — BP 102/60 | HR 66 | Wt 179.4 lb

## 2022-04-06 DIAGNOSIS — I2510 Atherosclerotic heart disease of native coronary artery without angina pectoris: Secondary | ICD-10-CM | POA: Insufficient documentation

## 2022-04-06 DIAGNOSIS — M79604 Pain in right leg: Secondary | ICD-10-CM | POA: Insufficient documentation

## 2022-04-06 DIAGNOSIS — I428 Other cardiomyopathies: Secondary | ICD-10-CM | POA: Insufficient documentation

## 2022-04-06 DIAGNOSIS — E1122 Type 2 diabetes mellitus with diabetic chronic kidney disease: Secondary | ICD-10-CM | POA: Diagnosis not present

## 2022-04-06 DIAGNOSIS — E1151 Type 2 diabetes mellitus with diabetic peripheral angiopathy without gangrene: Secondary | ICD-10-CM | POA: Insufficient documentation

## 2022-04-06 DIAGNOSIS — N183 Chronic kidney disease, stage 3 unspecified: Secondary | ICD-10-CM | POA: Diagnosis not present

## 2022-04-06 DIAGNOSIS — Z79899 Other long term (current) drug therapy: Secondary | ICD-10-CM | POA: Diagnosis not present

## 2022-04-06 DIAGNOSIS — J441 Chronic obstructive pulmonary disease with (acute) exacerbation: Secondary | ICD-10-CM | POA: Insufficient documentation

## 2022-04-06 DIAGNOSIS — I5022 Chronic systolic (congestive) heart failure: Secondary | ICD-10-CM

## 2022-04-06 DIAGNOSIS — Z1152 Encounter for screening for COVID-19: Secondary | ICD-10-CM | POA: Insufficient documentation

## 2022-04-06 DIAGNOSIS — I447 Left bundle-branch block, unspecified: Secondary | ICD-10-CM | POA: Insufficient documentation

## 2022-04-06 DIAGNOSIS — I130 Hypertensive heart and chronic kidney disease with heart failure and stage 1 through stage 4 chronic kidney disease, or unspecified chronic kidney disease: Secondary | ICD-10-CM | POA: Diagnosis not present

## 2022-04-06 DIAGNOSIS — R0600 Dyspnea, unspecified: Secondary | ICD-10-CM | POA: Insufficient documentation

## 2022-04-06 DIAGNOSIS — I739 Peripheral vascular disease, unspecified: Secondary | ICD-10-CM | POA: Diagnosis not present

## 2022-04-06 DIAGNOSIS — Z720 Tobacco use: Secondary | ICD-10-CM

## 2022-04-06 DIAGNOSIS — Z87891 Personal history of nicotine dependence: Secondary | ICD-10-CM | POA: Insufficient documentation

## 2022-04-06 DIAGNOSIS — Z7984 Long term (current) use of oral hypoglycemic drugs: Secondary | ICD-10-CM | POA: Diagnosis not present

## 2022-04-06 DIAGNOSIS — E785 Hyperlipidemia, unspecified: Secondary | ICD-10-CM | POA: Insufficient documentation

## 2022-04-06 DIAGNOSIS — Z7982 Long term (current) use of aspirin: Secondary | ICD-10-CM | POA: Diagnosis not present

## 2022-04-06 LAB — BASIC METABOLIC PANEL
Anion gap: 11 (ref 5–15)
BUN: 33 mg/dL — ABNORMAL HIGH (ref 6–20)
CO2: 22 mmol/L (ref 22–32)
Calcium: 9.2 mg/dL (ref 8.9–10.3)
Chloride: 105 mmol/L (ref 98–111)
Creatinine, Ser: 1.46 mg/dL — ABNORMAL HIGH (ref 0.44–1.00)
GFR, Estimated: 41 mL/min — ABNORMAL LOW (ref >60)
Glucose, Bld: 191 mg/dL — ABNORMAL HIGH (ref 70–99)
Potassium: 3.4 mmol/L — ABNORMAL LOW (ref 3.5–5.1)
Sodium: 138 mmol/L (ref 135–145)

## 2022-04-06 LAB — BRAIN NATRIURETIC PEPTIDE: B Natriuretic Peptide: 51.1 pg/mL (ref 0.0–100.0)

## 2022-04-06 MED ORDER — BUPROPION HCL ER (SR) 150 MG PO TB12: ORAL_TABLET | ORAL | 3 refills | Status: DC

## 2022-04-06 NOTE — Patient Instructions (Addendum)
START Wellbutrin 1 tab daily for 3 days, then 1 tab Twice daily after that    Labs done today, your results will be available in MyChart, we will contact you for abnormal readings.  Your physician has requested that you have an echocardiogram. Echocardiography is a painless test that uses sound waves to create images of your heart. It provides your doctor with information about the size and shape of your heart and how well your heart's chambers and valves are working. This procedure takes approximately one hour. There are no restrictions for this procedure. Please do NOT wear cologne, perfume, aftershave, or lotions (deodorant is allowed). Please arrive 15 minutes prior to your appointment time.  Your provider has ordered dopplers of your legs. You will be called to have the test arranged.  Your physician recommends that you schedule a follow-up appointment in: 6 weeks  If you have any questions or concerns before your next appointment please send [place] a message through [place] or call our office at [phone].    TO LEAVE A MESSAGE FOR THE NURSE SELECT OPTION 2, PLEASE LEAVE A MESSAGE INCLUDING: YOUR NAME DATE OF BIRTH CALL BACK NUMBER REASON FOR CALL**this is important as we prioritize the call backs  YOU WILL RECEIVE A CALL BACK THE SAME DAY AS LONG AS YOU CALL BEFORE 4:00 PM  At the [facility], you and your health needs are our priority. As part of our continuing mission to provide you with exceptional heart care, we have created designated Provider Care Teams. These Care Teams include your primary Cardiologist (physician) and Advanced Practice Providers (APPs- Physician Assistants and Nurse Practitioners) who all work together to provide you with the care you need, when you need it.   You may see any of the following providers on your designated Care Team at your next follow up: Dr Glori Bickers Dr Loralie Champagne Dr. Roxana Hires, NP Lyda Jester, [place] [facility] [place], [place] Forestine Na, NP Audry Riles, [name]   Please be sure to bring in all your medications bottles to every appointment.    Thank you for choosing [facility]

## 2022-04-06 NOTE — Progress Notes (Signed)
ReDS Vest / Clip - 04/06/22 0800       ReDS Vest / Clip   Station Marker C    Ruler Value 31    ReDS Value Range Low volume    ReDS Actual Value 27

## 2022-04-06 NOTE — Progress Notes (Signed)
Advanced Heart Failure Clinic Note   PCP: Luciano Cutter, DO PCP-Cardiologist: Rozann Lesches, MD  HF Cardiology: Dr. Aundra Dubin  Vascular: Dr Early   HPI: 62 y.o. female w/ history of DM, HTN, hyperlipidemia, and smoking.  She was admitted on 1/22 for about 1 week of shortness of breath. She had been started on azithromycin and prednisone by PCP and was taking inhalers for ?COPD exacerbation without relief.  COVID negative.  Dyspnea primarily exertional, no orthopnea. No chest pain reported.  In ER at [facility], pro-BNP was elevated and CXR suggestive of volume overload. Slightly elevated HS-TnI with no trend, not suggestive of ACS.  She was admitted for management of new diagnosis of CHF and seen by Dr. Domenic Polite.  Echo was done, showed EF 20-25% with mild RV dysfunction. She was started on IV Lasix for diuresed then transitioned to PO.  She was transferred from [facility] to [facility] for RHC/LHC to assess filling pressures/CO and also to assess for coronary disease as etiology.    Of note, her father developed CHF in his 21s, nonischemic cardiomyopathy.  He had ICD and later heart transplant.  Grandmother also had CHF.   Cath was done 1/21 and showed no significant coronary disease and optimized filling pressures with preserved cardiac output (see angiographic and hemodynamic data below). Diagnosed w/ NICM. cMRI showed no myocardial LGE, so no definitive evidence for prior MI, infiltrative disease, or cardiomyopathy. RV normal w/ EF 35%. She was started on GDMT w/ ARB, ? blocker and MRA + loop diuretic.   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 Broadus John, thought to be probably be a benign variant.   Echo 5/21 with EF 30-35% (closer to 35%), diffuse hypokinesis, mildly decreased RV systolic function, normal IVC. Echo in 11/21 showed EF 45%, diffuse hypokinesis, mildly decreased RV systolic function.  Echo in 11/22 showed stable EF 45-50%.   2022 ABI - Noninvasive test  from [facility] were reviewed.  This revealed ankle arm index of 0.68 on the right and 1.0 on the left  Patient returns for followup of CHF.  She reports more wheezing recently.  She is short of breath carrying groceries but not just walking on flat ground.  She gets pain in her right leg while carrying groceries or walking longer distances.  This has worsened.  No chest pain.  No orthopnea/PND.  No lightheadedness.  She quit smoking using Chantix, but insurance will not cover more Chantix and she is getting the urge to smoke again.   REDS clip 27%  Labs (10/23): LDL 70 Labs (12/23): K 3.3, creatinine 1.25  ECG (personally reviewed): NSR, LBBB 124 msec  PMH: 1. Hyperlipidemia 2. Type 2 diabetes.  3. Chronic systolic CHF:  - Echo ([id number]).  EF 20-25%, mildly decreased RV function.  - RHC/LHC (1/21): Nonobstructive CAD; mean RA 9, PA 28/5, mean PCWP 9, CI 2.37 - Cardiac MRI (1/21): LV EF 31%, RVEF 28%, No myocardial LGE.  - Echo (5/21): EF 30-35% (closer to 35%), diffuse hypokinesis, mildly decreased RV systolic function, normal IVC. 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  - Echo (11/21): EF 45%, diffuse hypokinesis, mildly decreased RV systolic function.  - Echo ( 11/22): EF 45-50%  4. Smoker: Quit in 2023 with Chantix.  5. CKD stage 3 6. PAD: ABIs (2022) with 0.68 on right and 1.0 on left.   Review of Systems: All systems reviewed  and negative except as per HPI.   Current Outpatient Medications  Medication Sig Dispense Refill   Accu-Chek FastClix Lancets MISC USE TO CHECK TWICE DAILY. 102 each 0   ACCU-CHEK GUIDE test strip Use as instructed to monitor glucose twice daily 400 each 3   aspirin EC 81 MG tablet Take 81 mg by mouth daily. Swallow whole.     atorvastatin (LIPITOR) 80 MG tablet Take 1 tablet (80 mg total) by mouth daily. 30 tablet 6   Blood Glucose Monitoring Suppl (ACCU-CHEK GUIDE ME) w/Device  KIT Use to monitor blood glucose twice daily, before breakfast and before bed. 1 kit 1   Blood Pressure Monitoring (BLOOD PRESSURE CUFF) MISC 1 Device by Does not apply route as directed. To monitor blood pressure as needed daily dx: I50.9 1 each 0   buPROPion (WELLBUTRIN SR) 150 MG 12 hr tablet Take 1 tab daily for 3 days, then take 1 tab Twice daily 180 tablet 3   busPIRone (BUSPAR) 7.5 MG tablet Take 7.5 mg by mouth 2 (two) times daily.     carvedilol (COREG) 6.25 MG tablet Take 6.25 mg by mouth 2 (two) times daily with a meal.     Cholecalciferol (VITAMIN D3) 50 MCG (2000 UT) capsule Take 2,000 Units by mouth daily.     diclofenac Sodium (VOLTAREN) 1 % GEL Apply 1 application  topically 4 (four) times daily as needed (pain).     ENTRESTO 24-26 MG TAKE ONE TABLET BY MOUTH TWICE A DAY 60 tablet 11   FARXIGA 10 MG TABS tablet TAKE ONE TABLET (10MG TOTAL) BY MOUTH DAILY 30 tablet 11   fenofibrate (TRICOR) 145 MG tablet Take 1 tablet (145 mg total) by mouth daily. 30 tablet 6   FLUoxetine (PROZAC) 40 MG capsule Take 40 mg by mouth daily.     fluticasone (FLONASE) 50 MCG/ACT nasal spray Place 1 spray into both nostrils daily.     glipiZIDE (GLUCOTROL) 5 MG tablet TAKE ONE TABLET (5MG TOTAL) BY MOUTH DAILY BEFORE BREAKFAST 90 tablet 0   levocetirizine (XYZAL) 5 MG tablet Take 5 mg by mouth every evening.     loperamide (IMODIUM) 2 MG capsule TAKE 2 CAPSULES BY MOUTH INITIALLY, THEN TAKE 1 CAPSULE AFTER EACH LOOSE STOOL. DO NOT EXCEED 8 CAPSULES IN 24 HOURS. 60 capsule 2   pantoprazole (PROTONIX) 40 MG tablet Take 1 tablet (40 mg total) by mouth daily. 90 tablet 3   spironolactone (ALDACTONE) 25 MG tablet TAKE ONE TABLET (25MG TOTAL) BY MOUTH DAILY 90 tablet 3   zolpidem (AMBIEN) 10 MG tablet Take 10 mg by mouth at bedtime as needed for sleep.     varenicline (CHANTIX) 1 MG tablet Take 1 tablet (1 mg total) by mouth 2 (two) times daily. (Patient not taking: Reported on 04/06/2022) 180 tablet 0   No  current facility-administered medications for this encounter.    Allergies  Allergen Reactions   Metformin     Causes GI issues      Social History   Socioeconomic History   Marital status: Divorced    Spouse name: Not on file   Number of children: Not on file   Years of education: Not on file   Highest education level: Not on file  Occupational History   Not on file  Tobacco Use   Smoking status: Former    Packs/day: 0.25    Years: 37.00    Total pack years: 9.25    Types: Cigarettes    Quit  date: 11/19/2021    Years since quitting: 0.3    Passive exposure: Never   Smokeless tobacco: Never   Tobacco comments:    1-2 cigarettes daily  Vaping Use   Vaping Use: Never used  Substance and Sexual Activity   Alcohol use: No   Drug use: No   Sexual activity: Yes    Birth control/protection: None  Other Topics Concern   Not on file  Social History Narrative   Not on file   Social Determinants of Health   Financial Resource Strain: Unknown (04/30/2018)   Overall Financial Resource Strain (CARDIA)    Difficulty of Paying Living Expenses: Patient refused  Food Insecurity: Unknown (04/30/2018)   Hunger Vital Sign    Worried About Running Out of Food in the Last Year: Patient refused    [place] in the Last Year: Patient refused  Transportation Needs: Unknown (04/30/2018)   PRAPARE - Transportation    Lack of Transportation (Medical): Patient refused    Lack of Transportation (Non-Medical): Patient refused  Physical Activity: Unknown (04/30/2018)   Exercise Vital Sign    Days of Exercise per Week: Patient refused    Minutes of Exercise per Session: Patient refused  Stress: Unknown (04/30/2018)   Katy    Feeling of Stress : Patient refused  Social Connections: Unknown (04/30/2018)   Social Connection and Isolation Panel [NHANES]    Frequency of Communication with Friends and Family: Patient refused     Frequency of Social Gatherings with Friends and Family: Patient refused    Attends Religious Services: Patient refused    Active Member of Clubs or Organizations: Patient refused    Attends Archivist Meetings: Patient refused    Marital Status: Patient refused  Intimate Partner Violence: Unknown (04/30/2018)   Humiliation, Afraid, Rape, and Kick questionnaire    Fear of Current or Ex-Partner: Patient refused    Emotionally Abused: Patient refused    Physically Abused: Patient refused    Sexually Abused: Patient refused      Family History  Problem Relation Age of Onset   Arthritis Mother    Hernia Mother    Constipation Mother    Heart failure Father        Underwent heart transplant   Colon cancer Neg Hx     Vitals:   04/06/22 0836  BP: 102/60  Pulse: 66  SpO2: 97%  Weight: 81.4 kg (179 lb 6.4 oz)   Wt Readings from Last 3 Encounters:  04/06/22 81.4 kg (179 lb 6.4 oz)  02/21/22 77.3 kg (170 lb 6.4 oz)  02/13/22 80.2 kg (176 lb 12.9 oz)    PHYSICAL EXAM: General: NAD Neck: No JVD, no thyromegaly or thyroid nodule.  Lungs: Clear to auscultation bilaterally with normal respiratory effort. CV: Nondisplaced PMI.  Heart regular S1/S2, no S3/S4, no murmur.  No peripheral edema.  No carotid bruit.  Unable to palpate right pedal pulses.  Abdomen: Soft, nontender, no hepatosplenomegaly, no distention.  Skin: Intact without lesions or rashes.  Neurologic: Alert and oriented x 3.  Psych: Normal affect. Extremities: No clubbing or cyanosis.  HEENT: Normal.   ASSESSMENT & PLAN: 1. Chronic Systolic CHF: Echo [id number] w/ EF 20-25%, mild RV dysfunction.  No prior cardiac history but father has history of nonischemic cardiomyopathy with heart transplant and paternal grandmother also had CHF.  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 Broadus John, probably benign  variant.  LHC/RHC 1/21 showed no significant coronary disease and optimized  filling pressures with preserved cardiac output. cMRI showed no myocardial LGE, so no definitive evidence for prior MI, infiltrative disease, or cardiomyopathy.  Echo 5/21 showed mild improvement in LV function, EF 30-35% (probably closer to 35%).  Echo in 11/22 with improved EF 45-50%. NYHA class II, somewhat worse dyspnea recently.  Not volume overloaded by exam or REDS clip, ?dyspnea related to COPD. BP runs low at times, no room to titrate meds.  - Continue Coreg 6.25 mg bid.  - Continue Entresto 24-26 bid. BMET/BNP today.  - Continue spironolactone 25 mg daily.  - Continue dapagliflozin 10 mg daily.  -  I will arrange for repeat echo.  2. Smoking: She quit using Chantix but cannot get further Chantix and is getting urge to smoke again. ?COPD causing current dyspnea as she does not look volume overloaded.  - Will prescribe Wellbutrin.  3. HTN: BP now low.  4. Type 2 diabetes: Sees an endocrinologist.  - Continue Wilder Glade. 5. Hyperlipidemia: Continue statin, good lipids in 10/23.  6. PAD: Follows with Dr Donnetta Hutching. Claudication in right leg has worsened even though she has quit smoking. No rest pain or pedal ulcerations.  - Will arrange for peripheral arterial dopplers to reassess.   Follow-up: 6 wks with APP.   Loralie Champagne, MD 04/06/22

## 2022-04-24 ENCOUNTER — Other Ambulatory Visit (HOSPITAL_COMMUNITY): Payer: Self-pay | Admitting: Cardiology

## 2022-04-24 DIAGNOSIS — Z720 Tobacco use: Secondary | ICD-10-CM

## 2022-04-24 DIAGNOSIS — I5022 Chronic systolic (congestive) heart failure: Secondary | ICD-10-CM

## 2022-04-24 DIAGNOSIS — I739 Peripheral vascular disease, unspecified: Secondary | ICD-10-CM

## 2022-04-24 DIAGNOSIS — I428 Other cardiomyopathies: Secondary | ICD-10-CM

## 2022-04-25 ENCOUNTER — Telehealth (HOSPITAL_COMMUNITY): Payer: Self-pay

## 2022-04-25 ENCOUNTER — Other Ambulatory Visit (HOSPITAL_COMMUNITY): Payer: Self-pay

## 2022-04-25 NOTE — Telephone Encounter (Signed)
Spoke to Tammy at [employer] and gave a verbal order to fill pt's Entresto 24-26 with a Quantity of 60 with 11 refills.

## 2022-04-26 ENCOUNTER — Encounter: Payer: Self-pay | Admitting: Radiology

## 2022-04-27 ENCOUNTER — Telehealth (HOSPITAL_COMMUNITY): Payer: Self-pay | Admitting: Pharmacy Technician

## 2022-04-27 NOTE — Telephone Encounter (Signed)
Advanced Heart Failure Patient Advocate Encounter  Prior Authorization for Delene Loll has been approved.    PA#  [id number] Effective dates: 04/27/22 through 04/27/23  Charlann Boxer, CPhT

## 2022-04-27 NOTE — Telephone Encounter (Signed)
Patient Advocate Encounter   Received notification from [facility] that prior authorization for [name] is required.   PA submitted on CoverMyMeds Key  [id number] Status is pending   Will continue to follow.

## 2022-05-01 ENCOUNTER — Other Ambulatory Visit (HOSPITAL_COMMUNITY): Payer: Self-pay | Admitting: Cardiology

## 2022-05-01 MED ORDER — ATORVASTATIN CALCIUM 80 MG PO TABS: 80.0000 mg | ORAL_TABLET | Freq: Every day | ORAL | 6 refills | Status: DC

## 2022-05-10 ENCOUNTER — Ambulatory Visit: Payer: Medicaid Other | Attending: Cardiology

## 2022-05-10 DIAGNOSIS — I739 Peripheral vascular disease, unspecified: Secondary | ICD-10-CM | POA: Diagnosis not present

## 2022-05-10 LAB — VAS US ABI WITH/WO TBI
Left ABI: 1
Right ABI: 0.77

## 2022-05-14 NOTE — Progress Notes (Signed)
Advanced Heart Failure Clinic Note   PCP: Catalina Lunger, DO Primary Cardiologist: Nona Dell, MD  HF Cardiology: Dr. Shirlee Latch  Vascular: Dr. Arbie Cookey   HPI: 62 y.o. female w/ history of DM, HTN, hyperlipidemia, and smoking.  She was admitted on 1/22 for about 1 week of shortness of breath. She had been started on azithromycin and prednisone by PCP and was taking inhalers for ?COPD exacerbation without relief.  COVID negative.  Dyspnea primarily exertional, no orthopnea. No chest pain reported.  In ER at [facility], pro-BNP was elevated and CXR suggestive of volume overload. Slightly elevated HS-TnI with no trend, not suggestive of ACS.  She was admitted for management of new diagnosis of CHF and seen by Dr. Diona Browner.  Echo was done, showed EF 20-25% with mild RV dysfunction. She was started on IV Lasix for diuresed then transitioned to PO.  She was transferred from [facility] to [facility] for RHC/LHC to assess filling pressures/CO and also to assess for coronary disease as etiology.    Of note, her father developed CHF in his 26s, nonischemic cardiomyopathy.  He had ICD and later heart transplant.  Grandmother also had CHF.   Cath was done 1/21 and showed no significant coronary disease and optimized filling pressures with preserved cardiac output (see angiographic and hemodynamic data below). Diagnosed w/ NICM. cMRI showed no myocardial LGE, so no definitive evidence for prior MI, infiltrative disease, or cardiomyopathy. RV normal w/ EF 35%. She was started on GDMT w/ ARB, ? blocker and MRA + loop diuretic.   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 Jomarie Longs, thought to be probably be a benign variant.   Echo 5/21 with EF 30-35% (closer to 35%), diffuse hypokinesis, mildly decreased RV systolic function, normal IVC. Echo in 11/21 showed EF 45%, diffuse hypokinesis, mildly decreased RV systolic function.  Echo in 11/22 showed stable EF 45-50%.   2022 ABI - Noninvasive  test from [facility] were reviewed.  This revealed ankle arm index of 0.68 on the right and 1.0 on the left  Follow up 2/24, NYHA II and volume stable, more dyspneic suspected due to COPD. Echo arranged. ABI 3/24 showed moderate PAD in R leg, stable from prior study.  Today she returns for HF follow up with her friend. Overall feeling fine. She feels her breathing is occasionally heavy. She has dyspnea when taking out her trash, but this happens intermittently. Denies palpitations, CP, dizziness, edema, or PND/Orthopnea. Chronically sleeps on 3 pillows. Appetite ok. No fever or chills. Weight at home 174 pounds. Has not smoked x 6 months.   Labs (10/23): LDL 70 Labs (12/23): K 3.3, creatinine 1.25 Labs (2/24): K 3.4, creatinine 1.46  ECG (personally reviewed): none ordered today.  PMH: 1. Hyperlipidemia 2. Type 2 diabetes.  3. Chronic systolic CHF:  - Echo (1/21).  EF 20-25%, mildly decreased RV function.  - RHC/LHC (1/21): Nonobstructive CAD; mean RA 9, PA 28/5, mean PCWP 9, CI 2.37 - Cardiac MRI (1/21): LV EF 31%, RVEF 28%, No myocardial LGE.  - Echo (5/21): EF 30-35% (closer to 35%), diffuse hypokinesis, mildly decreased RV systolic function, normal IVC. 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  - Echo (11/21): EF 45%, diffuse hypokinesis, mildly decreased RV systolic function.  - Echo (11/22): EF 45-50%  4. Smoker: Quit in 2023 with Chantix.  5. CKD stage 3 6. PAD: ABIs (2022) with 0.68 on  right and 1.0 on left.  - ABIs (3/24): 0.77 on right, 1.0 on left  Review of Systems: All systems reviewed and negative except as per HPI.   Current Outpatient Medications  Medication Sig Dispense Refill   Accu-Chek FastClix Lancets MISC USE TO CHECK TWICE DAILY. 102 each 0   ACCU-CHEK GUIDE test strip Use as instructed to monitor glucose twice daily 400 each 3   aspirin EC 81 MG tablet Take 81 mg by mouth daily.  Swallow whole.     atorvastatin (LIPITOR) 80 MG tablet Take 1 tablet (80 mg total) by mouth daily. 30 tablet 6   Blood Glucose Monitoring Suppl (ACCU-CHEK GUIDE ME) w/Device KIT Use to monitor blood glucose twice daily, before breakfast and before bed. 1 kit 1   Blood Pressure Monitoring (BLOOD PRESSURE CUFF) MISC 1 Device by Does not apply route as directed. To monitor blood pressure as needed daily dx: I50.9 1 each 0   buPROPion (WELLBUTRIN SR) 150 MG 12 hr tablet Take 1 tab daily for 3 days, then take 1 tab Twice daily 180 tablet 3   busPIRone (BUSPAR) 7.5 MG tablet Take 7.5 mg by mouth 2 (two) times daily.     carvedilol (COREG) 6.25 MG tablet Take 6.25 mg by mouth 2 (two) times daily with a meal.     Cholecalciferol (VITAMIN D3) 50 MCG (2000 UT) capsule Take 2,000 Units by mouth daily.     diclofenac Sodium (VOLTAREN) 1 % GEL Apply 1 application  topically 4 (four) times daily as needed (pain).     ENTRESTO 24-26 MG TAKE ONE TABLET BY MOUTH TWICE A DAY 60 tablet 11   FARXIGA 10 MG TABS tablet TAKE ONE TABLET (10MG  TOTAL) BY MOUTH DAILY 30 tablet 11   fenofibrate (TRICOR) 145 MG tablet Take 1 tablet (145 mg total) by mouth daily. 30 tablet 6   FLUoxetine (PROZAC) 40 MG capsule Take 40 mg by mouth daily.     fluticasone (FLONASE) 50 MCG/ACT nasal spray Place 1 spray into both nostrils daily.     glipiZIDE (GLUCOTROL) 5 MG tablet TAKE ONE TABLET (5MG  TOTAL) BY MOUTH DAILY BEFORE BREAKFAST 90 tablet 0   levocetirizine (XYZAL) 5 MG tablet Take 5 mg by mouth every evening.     loperamide (IMODIUM) 2 MG capsule TAKE 2 CAPSULES BY MOUTH INITIALLY, THEN TAKE 1 CAPSULE AFTER EACH LOOSE STOOL. DO NOT EXCEED 8 CAPSULES IN 24 HOURS. (Patient taking differently: TAKE 2 CAPSULES BY MOUTH INITIALLY, THEN TAKE 1 CAPSULE AFTER EACH LOOSE STOOL. DO NOT EXCEED 8 CAPSULES IN 24 HOURS as needed) 60 capsule 2   pantoprazole (PROTONIX) 40 MG tablet Take 1 tablet (40 mg total) by mouth daily. 90 tablet 3    spironolactone (ALDACTONE) 25 MG tablet TAKE ONE TABLET (25MG  TOTAL) BY MOUTH DAILY 90 tablet 3   zolpidem (AMBIEN) 10 MG tablet Take 10 mg by mouth at bedtime as needed for sleep.     No current facility-administered medications for this encounter.   Allergies  Allergen Reactions   Metformin     Causes GI issues   Social History   Socioeconomic History   Marital status: Divorced    Spouse name: Not on file   Number of children: Not on file   Years of education: Not on file   Highest education level: Not on file  Occupational History   Not on file  Tobacco Use   Smoking status: Former    Packs/day: 0.25    Years:  37.00    Additional pack years: 0.00    Total pack years: 9.25    Types: Cigarettes    Quit date: 11/19/2021    Years since quitting: 0.4    Passive exposure: Never   Smokeless tobacco: Never   Tobacco comments:    1-2 cigarettes daily  Vaping Use   Vaping Use: Never used  Substance and Sexual Activity   Alcohol use: No   Drug use: No   Sexual activity: Yes    Birth control/protection: None  Other Topics Concern   Not on file  Social History Narrative   Not on file   Social Determinants of Health   Financial Resource Strain: Unknown (04/30/2018)   Overall Financial Resource Strain (CARDIA)    Difficulty of Paying Living Expenses: Patient declined  Food Insecurity: Unknown (04/30/2018)   Hunger Vital Sign    Worried About Running Out of Food in the Last Year: Patient declined    Ran Out of Food in the Last Year: Patient declined  Transportation Needs: Unknown (04/30/2018)   PRAPARE - Administrator, Civil Service (Medical): Patient declined    Lack of Transportation (Non-Medical): Patient declined  Physical Activity: Unknown (04/30/2018)   Exercise Vital Sign    Days of Exercise per Week: Patient declined    Minutes of Exercise per Session: Patient declined  Stress: Unknown (04/30/2018)   [employer] of Occupational Health - Occupational  Stress Questionnaire    Feeling of Stress : Patient declined  Social Connections: Unknown (04/30/2018)   Social Connection and Isolation Panel [NHANES]    Frequency of Communication with Friends and Family: Patient declined    Frequency of Social Gatherings with Friends and Family: Patient declined    Attends Religious Services: Patient declined    Database administrator or Organizations: Patient declined    Attends Banker Meetings: Patient declined    Marital Status: Patient declined  Intimate Partner Violence: Unknown (04/30/2018)   Humiliation, Afraid, Rape, and Kick questionnaire    Fear of Current or Ex-Partner: Patient declined    Emotionally Abused: Patient declined    Physically Abused: Patient declined    Sexually Abused: Patient declined    Family History  Problem Relation Age of Onset   Arthritis Mother    Hernia Mother    Constipation Mother    Heart failure Father        Underwent heart transplant   Colon cancer Neg Hx    BP 110/62   Pulse 65   Wt 80.2 kg (176 lb 12.8 oz)   LMP 09/25/2011   SpO2 95%   BMI 27.69 kg/m   Wt Readings from Last 3 Encounters:  05/15/22 80.2 kg (176 lb 12.8 oz)  04/06/22 81.4 kg (179 lb 6.4 oz)  02/21/22 77.3 kg (170 lb 6.4 oz)   PHYSICAL EXAM: General:  NAD. No resp difficulty, walked into clinic HEENT: Normal Neck: Supple. No JVD. Carotids 2+ bilat; no bruits. No lymphadenopathy or thryomegaly appreciated. Cor: PMI nondisplaced. Regular rate & rhythm. No rubs, gallops or murmurs. Lungs: Diminished throughout. Abdomen: Soft, nontender, nondistended. No hepatosplenomegaly. No bruits or masses. Good bowel sounds. Extremities: No cyanosis, clubbing, rash, edema Neuro: Alert & oriented x 3, cranial nerves grossly intact. Moves all 4 extremities w/o difficulty. Affect pleasant.  ASSESSMENT & PLAN: 1. Chronic Systolic CHF: Echo 1/21 w/ EF 95-62%, mild RV dysfunction.  No prior cardiac history but father has history of  nonischemic cardiomyopathy with heart transplant  and paternal grandmother also had CHF.  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  LHC/RHC 1/21 showed no significant coronary disease and optimized filling pressures with preserved cardiac output. cMRI showed no myocardial LGE, so no definitive evidence for prior MI, infiltrative disease, or cardiomyopathy.  Echo 5/21 showed mild improvement in LV function, EF 30-35% (probably closer to 35%).  Echo in 11/22 with improved EF 45-50%. NYHA class II, somewhat worse dyspnea recently. She is not volume overloaded. She had an echo today, results pending. Suspect her symptoms are more due to COPD. - Continue Coreg 6.25 mg bid.  - Continue Entresto 24-26 bid. BMET/BNP today.  - Continue spironolactone 25 mg daily.  - Continue dapagliflozin 10 mg daily.  2. Smoking: She was a 30 year, 1/2 ppd smoker. She is on Wellbutrin, remains quit from cigarettes x 6 months. Suspect her dyspnea related to smoking/COPD. She does not carry a formal diagnosis. - Arrange PFTs (in [place] or [place]). She has PCP follow up in a few months, may need Pulmonary referral pending PFTs results. 3. HTN: BP well-controlled. No med changes. 4. Type 2 diabetes: Sees an endocrinologist.  - Continue Marcelline Deist. 5. Hyperlipidemia: Continue statin, good lipids in 10/23.  6. PAD: Follows with Dr Arbie Cookey. Claudication in right leg has continued to bother her, even though she has quit smoking. No rest pain or pedal ulcerations. ABIs (3/24) showed moderate PAD in R leg, stable from prior study. She will contact Dr. Arbie Cookey if pain worsens, or occurs with rest.  Follow up in 6 months with Dr. Shirlee Latch.  [address], FNP 05/15/22

## 2022-05-15 ENCOUNTER — Ambulatory Visit (HOSPITAL_COMMUNITY)
Admission: RE | Admit: 2022-05-15 | Discharge: 2022-05-15 | Disposition: A | Discharge status: Home / Self Care | Payer: Medicaid Other | Source: Ambulatory Visit | Attending: Family Medicine | Admitting: Family Medicine

## 2022-05-15 ENCOUNTER — Encounter (HOSPITAL_COMMUNITY): Payer: Self-pay

## 2022-05-15 ENCOUNTER — Ambulatory Visit (HOSPITAL_BASED_OUTPATIENT_CLINIC_OR_DEPARTMENT_OTHER)
Admission: RE | Admit: 2022-05-15 | Discharge: 2022-05-15 | Disposition: A | Discharge status: Home / Self Care | Payer: Medicaid Other | Source: Ambulatory Visit | Attending: Family Medicine | Admitting: Family Medicine

## 2022-05-15 VITALS — BP 110/62 | HR 65 | Wt 176.8 lb

## 2022-05-15 DIAGNOSIS — Z87891 Personal history of nicotine dependence: Secondary | ICD-10-CM | POA: Diagnosis not present

## 2022-05-15 DIAGNOSIS — Z79899 Other long term (current) drug therapy: Secondary | ICD-10-CM | POA: Diagnosis not present

## 2022-05-15 DIAGNOSIS — I10 Essential (primary) hypertension: Secondary | ICD-10-CM | POA: Diagnosis not present

## 2022-05-15 DIAGNOSIS — Z7984 Long term (current) use of oral hypoglycemic drugs: Secondary | ICD-10-CM | POA: Insufficient documentation

## 2022-05-15 DIAGNOSIS — J441 Chronic obstructive pulmonary disease with (acute) exacerbation: Secondary | ICD-10-CM | POA: Insufficient documentation

## 2022-05-15 DIAGNOSIS — R0600 Dyspnea, unspecified: Secondary | ICD-10-CM | POA: Diagnosis present

## 2022-05-15 DIAGNOSIS — E1151 Type 2 diabetes mellitus with diabetic peripheral angiopathy without gangrene: Secondary | ICD-10-CM | POA: Diagnosis not present

## 2022-05-15 DIAGNOSIS — I739 Peripheral vascular disease, unspecified: Secondary | ICD-10-CM | POA: Diagnosis not present

## 2022-05-15 DIAGNOSIS — Z8249 Family history of ischemic heart disease and other diseases of the circulatory system: Secondary | ICD-10-CM | POA: Insufficient documentation

## 2022-05-15 DIAGNOSIS — E1122 Type 2 diabetes mellitus with diabetic chronic kidney disease: Secondary | ICD-10-CM | POA: Diagnosis not present

## 2022-05-15 DIAGNOSIS — I5022 Chronic systolic (congestive) heart failure: Secondary | ICD-10-CM | POA: Insufficient documentation

## 2022-05-15 DIAGNOSIS — I130 Hypertensive heart and chronic kidney disease with heart failure and stage 1 through stage 4 chronic kidney disease, or unspecified chronic kidney disease: Secondary | ICD-10-CM | POA: Diagnosis not present

## 2022-05-15 DIAGNOSIS — E782 Mixed hyperlipidemia: Secondary | ICD-10-CM

## 2022-05-15 DIAGNOSIS — N183 Chronic kidney disease, stage 3 unspecified: Secondary | ICD-10-CM | POA: Insufficient documentation

## 2022-05-15 DIAGNOSIS — E785 Hyperlipidemia, unspecified: Secondary | ICD-10-CM | POA: Diagnosis not present

## 2022-05-15 LAB — BASIC METABOLIC PANEL
Anion gap: 9 (ref 5–15)
BUN: 40 mg/dL — ABNORMAL HIGH (ref 6–20)
CO2: 22 mmol/L (ref 22–32)
Calcium: 9.8 mg/dL (ref 8.9–10.3)
Chloride: 106 mmol/L (ref 98–111)
Creatinine, Ser: 1.59 mg/dL — ABNORMAL HIGH (ref 0.44–1.00)
GFR, Estimated: 37 mL/min — ABNORMAL LOW (ref >60)
Glucose, Bld: 125 mg/dL — ABNORMAL HIGH (ref 70–99)
Potassium: 4 mmol/L (ref 3.5–5.1)
Sodium: 137 mmol/L (ref 135–145)

## 2022-05-15 LAB — ECHOCARDIOGRAM COMPLETE
AR max vel: 2.24 cm2
AV Area VTI: 2.08 cm2
AV Area mean vel: 2.17 cm2
AV Mean grad: 3 mmHg
AV Peak grad: 6.2 mmHg
AV Vena cont: 0.2 cm
Ao pk vel: 1.24 m/s
Area-P 1/2: 3.21 cm2
Calc EF: 49.6 %
S' Lateral: 3.9 cm
Single Plane A2C EF: 47 %
Single Plane A4C EF: 51.4 %

## 2022-05-15 NOTE — Patient Instructions (Signed)
Thank you for coming in today  Labs were done today, if any labs are abnormal the clinic will call you No news is good news  Medications: No changes  Follow up appointments:  Your physician recommends that you schedule a follow-up appointment in: 6 months with Dr. Kendall Flack will receive a reminder letter in the mail a few months in advance. If you don't receive a letter, please call our office to schedule the follow-up appointment.     Do the following things EVERYDAY: Weigh yourself in the morning before breakfast. Write it down and keep it in a log. Take your medicines as prescribed Eat low salt foods--Limit salt (sodium) to 2000 mg per day.  Stay as active as you can everyday Limit all fluids for the day to less than 2 liters   At the [facility], you and your health needs are our priority. As part of our continuing mission to provide you with exceptional heart care, we have created designated Provider Care Teams. These Care Teams include your primary Cardiologist (physician) and Advanced Practice Providers (APPs- Physician Assistants and Nurse Practitioners) who all work together to provide you with the care you need, when you need it.   You may see any of the following providers on your designated Care Team at your next follow up: Dr Glori Bickers Dr Loralie Champagne Dr. Roxana Hires, NP Lyda Jester, [place] [facility] [place], [place] Forestine Na, NP Audry Riles, [name]   Please be sure to bring in all your medications bottles to every appointment.    Thank you for choosing [facility]  If you have any questions or concerns before your next appointment please send [place] a message through [place] or call our office at [phone].    TO LEAVE A MESSAGE FOR THE NURSE SELECT OPTION 2, PLEASE LEAVE A MESSAGE INCLUDING: YOUR NAME DATE OF BIRTH CALL BACK NUMBER REASON FOR CALL**this is  important as we prioritize the call backs  YOU WILL RECEIVE A CALL BACK THE SAME DAY AS LONG AS YOU CALL BEFORE 4:00 PM

## 2022-05-30 ENCOUNTER — Other Ambulatory Visit: Payer: Self-pay | Admitting: Nurse Practitioner

## 2022-05-31 ENCOUNTER — Other Ambulatory Visit (HOSPITAL_COMMUNITY): Payer: Self-pay | Admitting: Cardiology

## 2022-06-08 ENCOUNTER — Ambulatory Visit: Payer: Medicaid Other | Admitting: Cardiology

## 2022-06-18 ENCOUNTER — Other Ambulatory Visit (HOSPITAL_COMMUNITY): Payer: Self-pay | Admitting: Nurse Practitioner

## 2022-06-18 DIAGNOSIS — Z1231 Encounter for screening mammogram for malignant neoplasm of breast: Secondary | ICD-10-CM

## 2022-06-18 DIAGNOSIS — Z13820 Encounter for screening for osteoporosis: Secondary | ICD-10-CM

## 2022-06-22 ENCOUNTER — Other Ambulatory Visit: Payer: Self-pay | Admitting: Nurse Practitioner

## 2022-07-06 ENCOUNTER — Other Ambulatory Visit (HOSPITAL_COMMUNITY): Payer: Self-pay | Admitting: Cardiology

## 2022-07-17 ENCOUNTER — Other Ambulatory Visit: Payer: Self-pay | Admitting: Nurse Practitioner

## 2022-08-07 ENCOUNTER — Ambulatory Visit (HOSPITAL_COMMUNITY)
Admission: RE | Admit: 2022-08-07 | Discharge: 2022-08-07 | Disposition: A | Discharge status: Home / Self Care | Payer: Medicaid Other | Source: Ambulatory Visit | Attending: Family Medicine | Admitting: Family Medicine

## 2022-08-07 DIAGNOSIS — Z87891 Personal history of nicotine dependence: Secondary | ICD-10-CM | POA: Insufficient documentation

## 2022-08-07 DIAGNOSIS — I5022 Chronic systolic (congestive) heart failure: Secondary | ICD-10-CM | POA: Diagnosis present

## 2022-08-07 LAB — PULMONARY FUNCTION TEST
DL/VA % pred: 92 %
DL/VA: 3.83 ml/min/mmHg/L
DLCO unc % pred: 80 %
DLCO unc: 17.88 ml/min/mmHg
FEF 25-75 Post: 1.54 L/sec
FEF 25-75 Pre: 1.61 L/sec
FEF2575-%Change-Post: -4 %
FEF2575-%Pred-Post: 60 %
FEF2575-%Pred-Pre: 63 %
FEV1-%Change-Post: -1 %
FEV1-%Pred-Post: 78 %
FEV1-%Pred-Pre: 79 %
FEV1-Post: 2.23 L
FEV1-Pre: 2.28 L
FEV1FVC-%Change-Post: -5 %
FEV1FVC-%Pred-Pre: 93 %
FEV6-%Change-Post: 2 %
FEV6-%Pred-Post: 87 %
FEV6-%Pred-Pre: 85 %
FEV6-Post: 3.11 L
FEV6-Pre: 3.03 L
FEV6FVC-%Change-Post: -1 %
FEV6FVC-%Pred-Post: 99 %
FEV6FVC-%Pred-Pre: 100 %
FVC-%Change-Post: 3 %
FVC-%Pred-Post: 87 %
FVC-%Pred-Pre: 84 %
FVC-Post: 3.22 L
FVC-Pre: 3.1 L
Post FEV1/FVC ratio: 69 %
Post FEV6/FVC ratio: 96 %
Pre FEV1/FVC ratio: 73 %
Pre FEV6/FVC Ratio: 98 %
RV % pred: 113 %
RV: 2.43 L
TLC % pred: 97 %
TLC: 5.37 L

## 2022-08-07 MED ORDER — ALBUTEROL SULFATE (2.5 MG/3ML) 0.083% IN NEBU
2.5000 mg | INHALATION_SOLUTION | Freq: Once | RESPIRATORY_TRACT | Status: AC
  Administered 2022-08-07: 2.5 mg via RESPIRATORY_TRACT

## 2022-08-14 ENCOUNTER — Encounter (HOSPITAL_COMMUNITY): Payer: Self-pay | Admitting: Cardiology

## 2022-08-17 ENCOUNTER — Ambulatory Visit (HOSPITAL_COMMUNITY)
Admission: RE | Admit: 2022-08-17 | Discharge: 2022-08-17 | Disposition: A | Discharge status: Home / Self Care | Payer: Medicaid Other | Source: Ambulatory Visit | Attending: Nurse Practitioner | Admitting: Nurse Practitioner

## 2022-08-17 ENCOUNTER — Encounter (HOSPITAL_COMMUNITY): Payer: Self-pay

## 2022-08-17 DIAGNOSIS — Z13820 Encounter for screening for osteoporosis: Secondary | ICD-10-CM | POA: Diagnosis not present

## 2022-08-17 DIAGNOSIS — Z1231 Encounter for screening mammogram for malignant neoplasm of breast: Secondary | ICD-10-CM | POA: Diagnosis not present

## 2022-08-24 ENCOUNTER — Ambulatory Visit: Payer: Medicaid Other | Admitting: Nurse Practitioner

## 2022-08-24 ENCOUNTER — Encounter: Payer: Self-pay | Admitting: Nurse Practitioner

## 2022-08-24 VITALS — BP 88/60 | HR 65 | Ht 67.0 in | Wt 182.8 lb

## 2022-08-24 DIAGNOSIS — F17200 Nicotine dependence, unspecified, uncomplicated: Secondary | ICD-10-CM

## 2022-08-24 DIAGNOSIS — N1831 Chronic kidney disease, stage 3a: Secondary | ICD-10-CM

## 2022-08-24 DIAGNOSIS — I10 Essential (primary) hypertension: Secondary | ICD-10-CM | POA: Diagnosis not present

## 2022-08-24 DIAGNOSIS — Z7984 Long term (current) use of oral hypoglycemic drugs: Secondary | ICD-10-CM | POA: Diagnosis not present

## 2022-08-24 DIAGNOSIS — E1122 Type 2 diabetes mellitus with diabetic chronic kidney disease: Secondary | ICD-10-CM

## 2022-08-24 DIAGNOSIS — E782 Mixed hyperlipidemia: Secondary | ICD-10-CM

## 2022-08-24 LAB — POCT GLYCOSYLATED HEMOGLOBIN (HGB A1C): Hemoglobin A1C: 6.6 % — AB (ref 4.0–5.6)

## 2022-08-24 MED ORDER — GLIPIZIDE 5 MG PO TABS: ORAL_TABLET | ORAL | 3 refills | Status: DC

## 2022-08-24 NOTE — Progress Notes (Signed)
08/24/2022, 8:46 AM  Endocrinology follow-up note   Subjective:    Patient ID: [name], female    DOB: 1960-07-08.  [name] is being seen in follow-up after she was seen in consultation for management of currently uncontrolled symptomatic diabetes requested by  Catalina Lunger, DO.   Past Medical History:  Diagnosis Date   Anal fissure    Anxiety    Chronic systolic (congestive) heart failure (HCC)    a. 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22   Mixed hyperlipidemia    Nonischemic cardiomyopathy (HCC)    Type 2 diabetes mellitus (HCC)     Past Surgical History:  Procedure Laterality Date   BACK SURGERY  2005   lumbar disckectomy   COLONOSCOPY  10/01/2011   Procedure: COLONOSCOPY;  Surgeon: Malissa Hippo, MD;  [facility];  Service: Endoscopy;  Laterality: N/A;  730   COLONOSCOPY N/A 10/04/2016   Procedure: COLONOSCOPY;  Surgeon: Malissa Hippo, MD;  [facility];  Service: Endoscopy;  Laterality: N/A;  10:30   COLONOSCOPY WITH PROPOFOL N/A 02/13/2022   Procedure: COLONOSCOPY WITH PROPOFOL;  Surgeon: Dolores Frame, MD;  [facility];  Service: Gastroenterology;  Laterality: N/A;  945 am ASA 3   DILATION AND CURETTAGE OF UTERUS     FOOT BONE EXCISION     right   POLYPECTOMY  10/04/2016   Procedure: POLYPECTOMY;  Surgeon: Malissa Hippo, MD;  [facility];  Service: Endoscopy;;  colon   POLYPECTOMY  02/13/2022   Procedure: POLYPECTOMY;  Surgeon: Dolores Frame, MD;  [facility];  Service: Gastroenterology;;   RIGHT/LEFT HEART CATH AND CORONARY ANGIOGRAPHY N/A 03/23/2019   Procedure: RIGHT/LEFT HEART CATH AND CORONARY ANGIOGRAPHY;  Surgeon: Laurey Morale, MD;   Location: [facility] INVASIVE CV LAB;  Service: Cardiovascular;  Laterality: N/A;    Social History   Socioeconomic History   Marital status: Divorced    Spouse name: Not on file   Number of children: Not on file   Years of education: Not on file   Highest education level: Not on file  Occupational History   Not on file  Tobacco Use   Smoking status: Former    Packs/day: 0.25    Years: 37.00    Additional pack years: 0.00    Total pack years: 9.25    Types: Cigarettes    Quit date: 11/19/2021    Years since quitting: 0.7    Passive exposure: Never   Smokeless tobacco: Never   Tobacco comments:    1-2 cigarettes daily  Vaping Use   Vaping Use: Never used  Substance and Sexual Activity   Alcohol use: No   Drug use: No   Sexual activity: Yes    Birth control/protection: None  Other Topics Concern   Not on file  Social History Narrative   Not on file   Social Determinants of Health   Financial Resource Strain: Unknown (04/30/2018)  Overall Financial Resource Strain (CARDIA)    Difficulty of Paying Living Expenses: Patient declined  Food Insecurity: Unknown (04/30/2018)   Hunger Vital Sign    Worried About Running Out of Food in the Last Year: Patient declined    Ran Out of Food in the Last Year: Patient declined  Transportation Needs: Unknown (04/30/2018)   PRAPARE - Administrator, Civil Service (Medical): Patient declined    Lack of Transportation (Non-Medical): Patient declined  Physical Activity: Unknown (04/30/2018)   Exercise Vital Sign    Days of Exercise per Week: Patient declined    Minutes of Exercise per Session: Patient declined  Stress: Unknown (04/30/2018)   [employer] of Occupational Health - Occupational Stress Questionnaire    Feeling of Stress : Patient declined  Social Connections: Unknown (04/30/2018)   Social Connection and Isolation Panel [NHANES]    Frequency of Communication with Friends and Family: Patient declined    Frequency of Social  Gatherings with Friends and Family: Patient declined    Attends Religious Services: Patient declined    Database administrator or Organizations: Patient declined    Attends Banker Meetings: Patient declined    Marital Status: Patient declined    Family History  Problem Relation Age of Onset   Arthritis Mother    Hernia Mother    Constipation Mother    Heart failure Father        Underwent heart transplant   Colon cancer Neg Hx     Outpatient Encounter Medications as of 08/24/2022  Medication Sig   Accu-Chek FastClix Lancets MISC USE TO CHECK TWICE DAILY.   ACCU-CHEK GUIDE test strip Use as instructed to monitor glucose twice daily   aspirin EC 81 MG tablet Take 81 mg by mouth daily. Swallow whole.   atorvastatin (LIPITOR) 80 MG tablet Take 1 tablet (80 mg total) by mouth daily.   Blood Glucose Monitoring Suppl (ACCU-CHEK GUIDE ME) w/Device KIT Use to monitor blood glucose twice daily, before breakfast and before bed.   Blood Pressure Monitoring (BLOOD PRESSURE CUFF) MISC 1 Device by Does not apply route as directed. To monitor blood pressure as needed daily dx: I50.9   buPROPion (WELLBUTRIN SR) 150 MG 12 hr tablet Take 1 tab daily for 3 days, then take 1 tab Twice daily   busPIRone (BUSPAR) 7.5 MG tablet Take 7.5 mg by mouth 2 (two) times daily.   carvedilol (COREG) 6.25 MG tablet Take 6.25 mg by mouth 2 (two) times daily with a meal.   Cholecalciferol (VITAMIN D3) 50 MCG (2000 UT) capsule Take 2,000 Units by mouth daily.   diclofenac Sodium (VOLTAREN) 1 % GEL Apply 1 application  topically 4 (four) times daily as needed (pain).   ENTRESTO 24-26 MG TAKE ONE TABLET BY MOUTH TWICE A DAY   FARXIGA 10 MG TABS tablet TAKE ONE TABLET (10MG  TOTAL) BY MOUTH DAILY   fenofibrate (TRICOR) 145 MG tablet TAKE ONE TABLET (145 MG TOTAL) BY MOUTH DAILY.   FLUoxetine (PROZAC) 40 MG capsule Take 40 mg by mouth daily.   fluticasone (FLONASE) 50 MCG/ACT nasal spray Place 1 spray into both  nostrils daily.   glipiZIDE (GLUCOTROL) 5 MG tablet TAKE ONE TABLET (5MG  TOTAL) BY MOUTH DAILY BEFORE BREAKFAST   levocetirizine (XYZAL) 5 MG tablet Take 5 mg by mouth every evening.   loperamide (IMODIUM) 2 MG capsule TAKE 2 CAPSULES BY MOUTH INITIALLY, THEN TAKE 1 CAPSULE AFTER EACH LOOSE STOOL. DO NOT EXCEED 8 CAPSULES  IN 24 HOURS. (Patient taking differently: TAKE 2 CAPSULES BY MOUTH INITIALLY, THEN TAKE 1 CAPSULE AFTER EACH LOOSE STOOL. DO NOT EXCEED 8 CAPSULES IN 24 HOURS as needed)   pantoprazole (PROTONIX) 40 MG tablet Take 1 tablet (40 mg total) by mouth daily.   spironolactone (ALDACTONE) 25 MG tablet TAKE ONE TABLET (25MG  TOTAL) BY MOUTH DAILY   zolpidem (AMBIEN) 10 MG tablet Take 10 mg by mouth at bedtime as needed for sleep.   No facility-administered encounter medications on file as of 08/24/2022.    ALLERGIES: Allergies  Allergen Reactions   Metformin     Causes GI issues    VACCINATION STATUS:  There is no immunization history on file for this patient.  Diabetes She presents for her follow-up diabetic visit. She has type 2 diabetes mellitus. Onset time: She was diagnosed at approximate age of 31 years. Her disease course has been stable. There are no hypoglycemic associated symptoms. Pertinent negatives for hypoglycemia include no confusion, headaches, pallor or seizures. Associated symptoms include fatigue. Pertinent negatives for diabetes include no chest pain, no polydipsia, no polyphagia and no polyuria. There are no hypoglycemic complications. Symptoms are stable. Diabetic complications include heart disease and nephropathy. Risk factors for coronary artery disease include dyslipidemia, diabetes mellitus, obesity, sedentary lifestyle, post-menopausal and tobacco exposure. Current diabetic treatment includes oral agent (dual therapy). She is compliant with treatment most of the time. Her weight is fluctuating minimally. She is following a generally unhealthy diet. When asked  about meal planning, she reported none. She has not had a previous visit with a dietitian. She rarely participates in exercise. Her home blood glucose trend is fluctuating minimally. Her breakfast blood glucose range is generally 130-140 mg/dl. Her bedtime blood glucose range is generally 140-180 mg/dl. Her overall blood glucose range is 130-140 mg/dl. (She presents today with her meter and logs showing stable, at goal glycemic profile overall.  Her POCT A1c today is 6.6%, improving from last visit of 6.8%.  She denies any hypoglycemia.  Analysis of her meter shows 7-day average of 146, 14-day average of 144, 30-day average of 153, 90-day average of 146.  ) An ACE inhibitor/angiotensin II receptor blocker is being taken. Eye exam is current.  Hyperlipidemia This is a chronic problem. The current episode started more than 1 year ago. The problem is uncontrolled. Recent lipid tests were reviewed and are high. Exacerbating diseases include chronic renal disease, diabetes and obesity. Factors aggravating her hyperlipidemia include fatty foods, beta blockers and smoking. Pertinent negatives include no chest pain, myalgias or shortness of breath. Current antihyperlipidemic treatment includes statins and fibric acid derivatives. The current treatment provides mild improvement of lipids. Compliance problems include adherence to diet, adherence to exercise and psychosocial issues.  Risk factors for coronary artery disease include dyslipidemia, diabetes mellitus, obesity, a sedentary lifestyle and post-menopausal.  Hypertension This is a chronic problem. The current episode started more than 1 year ago. The problem has been resolved since onset. The problem is controlled. Pertinent negatives include no chest pain, headaches, palpitations or shortness of breath. There are no associated agents to hypertension. Risk factors for coronary artery disease include diabetes mellitus, dyslipidemia, obesity, smoking/tobacco  exposure, sedentary lifestyle and post-menopausal state. Past treatments include ACE inhibitors, beta blockers, angiotensin blockers and diuretics. The current treatment provides significant improvement. Compliance problems include diet and exercise.  Hypertensive end-organ damage includes kidney disease and heart failure. Identifiable causes of hypertension include chronic renal disease.    Review of systems  Constitutional: +  Minimally fluctuating body weight,  current Body mass index is 28.63 kg/m. , no fatigue, no subjective hyperthermia, no subjective hypothermia Eyes: no blurry vision, no xerophthalmia ENT: no sore throat, no nodules palpated in throat, no dysphagia/odynophagia, no hoarseness Cardiovascular: no chest pain, no shortness of breath, no palpitations, no leg swelling Respiratory: no cough, no shortness of breath Gastrointestinal: no nausea/vomiting/diarrhea Musculoskeletal: no muscle/joint aches Skin: no rashes, no hyperemia Neurological: no tremors, no numbness, no tingling, no dizziness Psychiatric: no depression, no anxiety   Objective:    BP (!) 88/60 (BP Location: Left Arm, Patient Position: Sitting, Cuff Size: Large)   Pulse 65   Ht 5\' 7"  (1.702 m)   Wt 182 lb 12.8 oz (82.9 kg)   LMP 09/25/2011   BMI 28.63 kg/m   Wt Readings from Last 3 Encounters:  08/24/22 182 lb 12.8 oz (82.9 kg)  05/15/22 176 lb 12.8 oz (80.2 kg)  04/06/22 179 lb 6.4 oz (81.4 kg)    BP Readings from Last 3 Encounters:  08/24/22 (!) 88/60  05/15/22 110/62  04/06/22 102/60     Physical Exam- Limited  Constitutional:  Body mass index is 28.63 kg/m. , not in acute distress, normal state of mind Eyes:  EOMI, no exophthalmos Musculoskeletal: no gross deformities, strength intact in all four extremities, no gross restriction of joint movements Skin:  no rashes, no hyperemia Neurological: no tremor with outstretched hands  Diabetic Foot Exam - Simple   No data filed     CMP (  most recent) CMP     Component Value Date/Time   NA 137 05/15/2022 1228   NA 139 07/15/2019 0825   K 4.0 05/15/2022 1228   CL 106 05/15/2022 1228   CO2 22 05/15/2022 1228   GLUCOSE 125 (H) 05/15/2022 1228   BUN 40 (H) 05/15/2022 1228   BUN 27 (H) 07/15/2019 0825   CREATININE 1.59 (H) 05/15/2022 1228   CREATININE 1.12 (H) 06/05/2019 0710   CALCIUM 9.8 05/15/2022 1228   PROT 6.5 09/29/2021 1237   ALBUMIN 4.2 09/29/2021 1237   AST 17 09/29/2021 1237   ALT 16 09/29/2021 1237   ALKPHOS 78 09/29/2021 1237   BILITOT 0.5 09/29/2021 1237   GFRNONAA 37 (L) 05/15/2022 1228   GFRAA 54 (L) 10/02/2019 1228     Diabetic Labs (most recent): Lab Results  Component Value Date   HGBA1C 6.6 (A) 08/24/2022   HGBA1C 6.8 (A) 02/21/2022   HGBA1C 7.4 08/22/2021   MICROALBUR 10 08/04/2020    Assessment & Plan:   1) Diabetes mellitus with stage 3 chronic kidney disease (HCC)  - [address] has currently uncontrolled symptomatic type 2 DM since  62 years of age.  She presents today with her meter and logs showing stable, at goal glycemic profile overall.  Her POCT A1c today is 6.6%, improving from last visit of 6.8%.  She denies any hypoglycemia.  Analysis of her meter shows 7-day average of 146, 14-day average of 144, 30-day average of 153, 90-day average of 146.    Recent labs reviewed showing slightly worse renal function- has upcoming appt with nephrology soon.  - I had a long discussion with her about the progressive nature of diabetes and the pathology behind its complications. -her diabetes is complicated by CHF, CKD stage 3b , smoking and she remains at a high risk for more acute and chronic complications which include CAD, CVA, CKD, retinopathy, and neuropathy. These are all discussed in detail with her.  -  Nutritional counseling repeated at each appointment due to patients tendency to fall back in to old habits.  - The patient admits there is a room for improvement in their diet and  drink choices. -  Suggestion is made for the patient to avoid simple carbohydrates from their diet including Cakes, Sweet Desserts / Pastries, Ice Cream, Soda (diet and regular), Sweet Tea, Candies, Chips, Cookies, Sweet Pastries, Store Bought Juices, Alcohol in Excess of 1-2 drinks a day, Artificial Sweeteners, Coffee Creamer, and "Sugar-free" Products. This will help patient to have stable blood glucose profile and potentially avoid unintended weight gain.   - I encouraged the patient to switch to unprocessed or minimally processed complex starch and increased protein intake (animal or plant source), fruits, and vegetables.   - Patient is advised to stick to a routine mealtimes to eat 3 meals a day and avoid unnecessary snacks (to snack only to correct hypoglycemia).  - I have approached her with the following individualized plan to manage her diabetes and patient agrees:   - She did not tolerate Metformin.  -She is advised to continue her Farxiga 10 mg po daily (as prescribed by cardiology) and her Glipizide 5 mg PO daily with breakfast.   -She is encouraged to continue monitoring blood glucose twice daily, before breakfast and before bed, and to call the clinic if she has readings less than 70 or greater than 200 for 3 tests in a row.    - she is not a suitable candidate for incretin therapy either due to her body habitus as well as chronic smoking history, posing risk for pancreatitis.  -Addition of basal insulin would be the safest option if she loses control on subsequent visits.  - Specific targets for  A1c;  LDL, HDL,  and Triglycerides were discussed with the patient.  2) Blood Pressure /Hypertension:  -Her blood pressure is controlled to target- low today.   she is advised to continue her current medications including Entresto 24/26 mg p.o. daily with breakfast, Carvedilol 6.25 mg p.o. twice daily as well as Spironolactone 25 mg p.o. daily. Med changes will be deferred to her  cardiologist/nephrologist.  3) Lipids/Hyperlipidemia:    Her most recent lipid panel from 12/01/21 shows controlled LDL of 70.  She is advised to continue Atorvastatin 80 mg po daily at bedtime and Fenofibrate 48 mg po daily.  Side effects and precautions discussed with her.  4)  Weight/Diet:  Her Body mass index is 28.63 kg/m.  -She is a candidate for some weight loss. I discussed with her the fact that loss of 5 - 10% of her  current body weight will have the most impact on her diabetes management.  Exercise, and detailed carbohydrates information provided  -  detailed on discharge instructions.  5) Chronic Care/Health Maintenance: -she is on ACEI/ARB and Statin medications and  is encouraged to initiate and continue to follow up with Ophthalmology, Dentist, orthopedic surgeon, cardiology, podiatrist at least yearly or according to recommendations, and advised to  stay away from smoking (quit Sept 2023). I have recommended yearly flu vaccine and pneumonia vaccine at least every 5 years; moderate intensity exercise for up to 150 minutes weekly; and  sleep for at least 7 hours a day.     - she is advised to maintain close follow up with Catalina Lunger, DO for primary care needs, as well as her other providers for optimal and coordinated care.      I spent  25  minutes in  the care of the patient today including review of labs from CMP, Lipids, Thyroid Function, Hematology (current and previous including abstractions from other facilities); face-to-face time discussing  her blood glucose readings/logs, discussing hypoglycemia and hyperglycemia episodes and symptoms, medications doses, her options of short and long term treatment based on the latest standards of care / guidelines;  discussion about incorporating lifestyle medicine;  and documenting the encounter. Risk reduction counseling performed per USPSTF guidelines to reduce obesity and cardiovascular risk factors.     Please refer to Patient  Instructions for Blood Glucose Monitoring and Insulin/Medications Dosing Guide"  in media tab for additional information. Please  also refer to " Patient Self Inventory" in the Media  tab for reviewed elements of pertinent patient history.  Gwendalyn Ege participated in the discussions, expressed understanding, and voiced agreement with the above plans.  All questions were answered to her satisfaction. she is encouraged to contact clinic should she have any questions or concerns prior to her return visit.     Follow up plan: - Return in about 6 months (around 02/23/2023) for Diabetes F/U with A1c in office, Bring meter and logs.  Ronny Bacon, [facility] [address] [place], [place] [address] Phone: [phone] Fax: [phone]  08/24/2022, 8:46 AM

## 2022-09-06 ENCOUNTER — Other Ambulatory Visit: Payer: Self-pay | Admitting: Nurse Practitioner

## 2022-10-16 ENCOUNTER — Other Ambulatory Visit (HOSPITAL_COMMUNITY): Payer: Self-pay | Admitting: Cardiology

## 2022-10-30 ENCOUNTER — Other Ambulatory Visit: Payer: Self-pay | Admitting: Nurse Practitioner

## 2022-11-23 ENCOUNTER — Ambulatory Visit: Payer: Medicaid Other | Admitting: Cardiology

## 2022-11-23 ENCOUNTER — Encounter (HOSPITAL_COMMUNITY): Payer: Medicaid Other | Admitting: Cardiology

## 2022-11-27 ENCOUNTER — Telehealth: Payer: Self-pay

## 2022-11-27 NOTE — Telephone Encounter (Signed)
Pt came by office stating that her Accuchek guide me was not working. It was giving code E-3 101. I also tried a glucometer here in the office. It was giving the same code. I called [phone]. They state that the issue is with the strips. They are going mail the pt a new box of strips. Pt was notified.

## 2022-12-03 ENCOUNTER — Other Ambulatory Visit (HOSPITAL_COMMUNITY): Payer: Self-pay | Admitting: Cardiology

## 2022-12-18 ENCOUNTER — Other Ambulatory Visit: Payer: Self-pay

## 2022-12-18 MED ORDER — ACCU-CHEK FASTCLIX LANCETS MISC: 0 refills | Status: DC

## 2022-12-20 ENCOUNTER — Encounter (INDEPENDENT_AMBULATORY_CARE_PROVIDER_SITE_OTHER): Payer: Self-pay | Admitting: Gastroenterology

## 2022-12-20 ENCOUNTER — Ambulatory Visit (INDEPENDENT_AMBULATORY_CARE_PROVIDER_SITE_OTHER): Payer: Medicaid Other | Admitting: Gastroenterology

## 2022-12-20 VITALS — BP 90/52 | HR 79 | Temp 97.8°F | Ht 67.0 in | Wt 185.5 lb

## 2022-12-20 DIAGNOSIS — K5903 Drug induced constipation: Secondary | ICD-10-CM | POA: Insufficient documentation

## 2022-12-20 DIAGNOSIS — K582 Mixed irritable bowel syndrome: Secondary | ICD-10-CM

## 2022-12-20 NOTE — Patient Instructions (Signed)
Increase water intake, aim for atleast 64 oz per day Increase fruits, veggies and whole grains, kiwi and prunes are especially good for constipation Stop benefiber for now, Start taking Miralax 1 capful every day for one week. If bowel movements do not improve, increase to 1 capful every 12 hours. If after two weeks there is no improvement, increase to 1 capful every 8 hours  Please let me know if you do not have results from the miralax as there are other things we can try  Follow up 3 months  It was a pleasure to see you today. I want to create trusting relationships with patients and provide genuine, compassionate, and quality care. I truly value your feedback! please be on the lookout for a survey regarding your visit with me today. I appreciate your input about our visit and your time in completing this!    Reiana Poteet L. Jeanmarie Hubert, MSN, APRN, AGNP-C Adult-Gerontology Nurse Practitioner [facility]

## 2022-12-20 NOTE — Progress Notes (Addendum)
Referring Provider: No ref. provider found Primary Care Physician:  Catalina Lunger, DO Primary GI Physician: Dr. Levon Hedger   Chief Complaint  Patient presents with   Irritable Bowel Syndrome    Follow up on IBS. States since being put on calcium around July she has been having constipation. Tried laxative, stool softners and suppositories.    HPI:   [name] is a 62 y.o. female with past medical history of anal fissure, anxiety, CHF, depression, HTN, GERD, HLD, nonischemic cardiomyopathy, DM Type 2.   Patient presenting today for follow up of IBS-M  Last seen October 2023, at that time having mix of looser stools and constipation, going 3-4 times per day.  She did more rolling in her stomach at times but denied abdominal pain.  Taking loperamide as needed.  Taking a laxative at times when she has to strain.  Some fecal urgency during times of looser stools.  GERD well-managed with Protonix 40 mg daily  Recommended continue protonix 40mg  daily, continue loperamide as needed, schedule colonoscopy, Benefiber 1 tablespoon twice daily with meals, good water intake, low FODMAP diet and food/stool journal.  Present:  States that she was previously having more diarrhea but was started on calcium and is now having constipation. Doing benefiber and trying to drink plenty of water and get plenty of fruit in her diet. She has tried a stool softener, laxative and having to use suppositories often to have a BM. Having to strain a lot to go. She is having a BM daily but notes it is difficult to pass her stool. No abdominal pain. She notes a strong urge to defecate but often has to sit for long periods of time to try and go. No rectal bleeding, melena, changes in appetite, nausea, vomtiting or weight loss.    Last Colonoscopy:01/2022- Seven 3 to 8 mm polyps in the transverse colon                            and in the ascending colon, removed with a cold                            snare. Resected and  retrieved.                           - Five 3 to 10 mm polyps in the sigmoid colon and                            in the descending colon, removed with a cold snare.                            Resected and retrieved.                           - Diverticulosis in the sigmoid colon and in the                            descending colon.                        - Non-bleeding internal hemorrhoids. 12 colonic tubular adenomas, repeat colonoscopy in 3 years     Last  Endoscopy:never   Past Medical History:  Diagnosis Date   Anal fissure    Anxiety    Chronic systolic (congestive) heart failure (HCC)    a. 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88   Mixed hyperlipidemia    Nonischemic cardiomyopathy (HCC)    Type 2 diabetes mellitus (HCC)     Past Surgical History:  Procedure Laterality Date   BACK SURGERY  2005   lumbar disckectomy   COLONOSCOPY  10/01/2011   Procedure: COLONOSCOPY;  Surgeon: Malissa Hippo, MD;  [facility];  Service: Endoscopy;  Laterality: N/A;  730   COLONOSCOPY N/A 10/04/2016   Procedure: COLONOSCOPY;  Surgeon: Malissa Hippo, MD;  [facility];  Service: Endoscopy;  Laterality: N/A;  10:30   COLONOSCOPY WITH PROPOFOL N/A 02/13/2022   Procedure: COLONOSCOPY WITH PROPOFOL;  Surgeon: Dolores Frame, MD;  [facility]: [facility];  Service: Gastroenterology;  Laterality: N/A;  945 am ASA 3   DILATION AND CURETTAGE OF UTERUS     FOOT BONE EXCISION     right   POLYPECTOMY  10/04/2016   Procedure: POLYPECTOMY;  Surgeon: Malissa Hippo, MD;  [facility];  Service: Endoscopy;;  colon   POLYPECTOMY  02/13/2022   Procedure: POLYPECTOMY;  Surgeon: Dolores Frame, MD;  [facility];  Service: Gastroenterology;;   RIGHT/LEFT HEART CATH AND CORONARY  ANGIOGRAPHY N/A 03/23/2019   Procedure: RIGHT/LEFT HEART CATH AND CORONARY ANGIOGRAPHY;  Surgeon: Laurey Morale, MD;  Location[facility] INV[facility] CV LAB;  Service: Cardiovascular;  Laterality: N/A;    Current Outpatient Medications  Medication Sig Dispense Refill   Accu-Chek FastClix Lancets MISC USE TO CHECK TWICE DAILY. 100 each 0   aspirin EC 81 MG tablet Take 81 mg by mouth daily. Swallow whole.     atorvastatin (LIPITOR) 80 MG tablet TAKE ONE TABLET (80MG  TOTAL) BY MOUTH DAILY 90 tablet 3   Blood Glucose Monitoring Suppl (ACCU-CHEK GUIDE ME) w/Device KIT Use to monitor blood glucose twice daily, before breakfast and before bed. 1 kit 1   Blood Pressure Monitoring (BLOOD PRESSURE CUFF) MISC 1 Device by Does not apply route as directed. To monitor blood pressure as needed daily dx: I50.9 1 each 0   buPROPion (WELLBUTRIN SR) 150 MG 12 hr tablet Take 1 tab daily for 3 days, then take 1 tab Twice daily 180 tablet 3   busPIRone (BUSPAR) 7.5 MG tablet Take 7.5 mg by mouth 2 (two) times daily.     carvedilol (COREG) 6.25 MG tablet Take 6.25 mg by mouth 2 (two) times daily with a meal.     Cholecalciferol (VITAMIN D3) 50 MCG (2000 UT) capsule Take 2,000 Units by mouth daily.     ENTRESTO 24-26 MG TAKE ONE TABLET BY MOUTH TWICE A DAY 60 tablet 11   FARXIGA 10 MG TABS tablet TAKE ONE TABLET (10MG  TOTAL) BY MOUTH DAILY 30 tablet 11   fenofibrate (TRICOR) 145 MG tablet TAKE ONE TABLET (145 MG TOTAL) BY MOUTH DAILY. 30 tablet 6   FLUoxetine (PROZAC) 40 MG capsule Take 40 mg by mouth daily.     fluticasone (FLONASE) 50 MCG/ACT nasal spray Place 1 spray into both nostrils daily.     glipiZIDE (GLUCOTROL) 5 MG tablet TAKE  ONE TABLET (5MG  TOTAL) BY MOUTH DAILY BEFORE BREAKFAST 90 tablet 3   glucose blood (ACCU-CHEK GUIDE) test strip USE TO TEST TWICE DAILY. 50 strip PRN   levocetirizine (XYZAL) 5 MG tablet Take 5 mg by mouth every evening.     loperamide (IMODIUM) 2 MG capsule TAKE 2 CAPSULES BY MOUTH  INITIALLY, THEN TAKE 1 CAPSULE AFTER EACH LOOSE STOOL. DO NOT EXCEED 8 CAPSULES IN 24 HOURS. (Patient taking differently: TAKE 2 CAPSULES BY MOUTH INITIALLY, THEN TAKE 1 CAPSULE AFTER EACH LOOSE STOOL. DO NOT EXCEED 8 CAPSULES IN 24 HOURS as needed) 60 capsule 2   OVER THE COUNTER MEDICATION Calcium citrate 600mg  - takes 2 daily     pantoprazole (PROTONIX) 40 MG tablet Take 1 tablet (40 mg total) by mouth daily. 90 tablet 3   sodium chloride (OCEAN) 0.65 % SOLN nasal spray Place 1 spray into both nostrils 2 times daily at 12 noon and 4 pm.     spironolactone (ALDACTONE) 25 MG tablet TAKE ONE TABLET (25MG  TOTAL) BY MOUTH DAILY 90 tablet 3   zolpidem (AMBIEN) 10 MG tablet Take 10 mg by mouth at bedtime as needed for sleep.     No current facility-administered medications for this visit.    Allergies as of 12/20/2022 - Review Complete 12/20/2022  Allergen Reaction Noted   Metformin  09/26/2020    Family History  Problem Relation Age of Onset   Arthritis Mother    Hernia Mother    Constipation Mother    Heart failure Father        Underwent heart transplant   Colon cancer Neg Hx     Social History   Socioeconomic History   Marital status: Divorced    Spouse name: Not on file   Number of children: Not on file   Years of education: Not on file   Highest education level: Not on file  Occupational History   Not on file  Tobacco Use   Smoking status: Former    Current packs/day: 0.00    Average packs/day: 0.3 packs/day for 37.0 years (9.3 ttl pk-yrs)    Types: Cigarettes    Start date: 11/19/1984    Quit date: 11/19/2021    Years since quitting: 1.0    Passive exposure: Never   Smokeless tobacco: Never   Tobacco comments:    1-2 cigarettes daily  Vaping Use   Vaping status: Never Used  Substance and Sexual Activity   Alcohol use: No   Drug use: No   Sexual activity: Yes    Birth control/protection: None  Other Topics Concern   Not on file  Social History Narrative   Not  on file   Social Determinants of Health   Financial Resource Strain: Low Risk  (05/31/2021)   Received from [name]   Overall Financial Resource Strain (CARDIA)  Food Insecurity: No Food Insecurity (05/31/2021)   [facility]   Hunger Vital Sign  Transportation Needs: No Transportation Needs (05/31/2021)   Received from [facility]   PRAPARE - Transportation  Physical Activity: Insufficiently Active (09/28/2020)   Received from [facility], [facility]   Exercise Vital Sign    Days of Exercise per Week: 5 days    Minutes of Exercise per Session: 10 min  Stress: No Stress Concern Present (05/31/2021)   Received from [facility] - Occupational Stress Questionnaire  Social Connections: Moderately Isolated (09/28/2020)  Received from [facility], [facility]   Social Connection and Isolation Panel [NHANES]    Frequency of Communication with Friends and Family: Once a week    Frequency of Social Gatherings with Friends and Family: Once a week    Attends Religious Services: 1 to 4 times per year    Active Member of [employer] or Organizations: No    Attends Engineer, structural: 1 to 4 times per year    Marital Status: Divorced    Review of systems General: negative for malaise, night sweats, fever, chills, weight loss Neck: Negative for lumps, goiter, pain and significant neck swelling Resp: Negative for cough, wheezing, dyspnea at rest CV: Negative for chest pain, leg swelling, palpitations, orthopnea GI: denies melena, hematochezia, nausea, vomiting, diarrhea,  dysphagia, odyonophagia, early satiety or unintentional weight loss. +constipation  The remainder of the review of systems is noncontributory.  Physical Exam: BP (!) 90/52 (BP Location: Right Arm, Patient Position: Sitting, Cuff Size: Normal)   Pulse 79   Temp 97.8 F (36.6 C) (Oral)   Ht 5\' 7"  (1.702 m)   Wt 185 lb 8 oz (84.1 kg)    LMP 09/25/2011   BMI 29.05 kg/m  General:   Alert and oriented. No distress noted. Pleasant and cooperative.  Head:  Normocephalic and atraumatic. Eyes:  Conjuctiva clear without scleral icterus. Mouth:  Oral mucosa pink and moist. Good dentition. No lesions. Heart: Normal rate and rhythm, s1 and s2 heart sounds present.  Lungs: Clear lung sounds in all lobes. Respirations equal and unlabored. Abdomen:  +BS, soft, non-tender and non-distended. No rebound or guarding. No HSM or masses noted. Neurologic:  Alert and  oriented x4 Psych:  Alert and cooperative. Normal mood and affect.  Invalid input(s): "6 MONTHS"   ASSESSMENT: GEORGIANA EILER is a 62 y.o. female presenting today for follow up of IBS-M  Now with more constipation since starting calcium supplements. Having a BM daily but having to strain significantly to defecate. Doing benefiber and good water/high fiber intake in her diet. Suspect constipation is secondary to calcium use as this is a known contributor to constipation. Recommend stopping benefiber for now, continue with good water intake, diet high in fiber and Start taking Miralax 1 capful every day for one week. If bowel movements do not improve, increase to 1 capful every 12 hours. If after two weeks there is no improvement, increase to 1 capful every 8 hours. She should let me know if she does not have improvement with use of miralax.    PLAN:  Continue with good water intake  2. Start taking Miralax 1 capful every day for one week. If bowel movements do not improve, increase to 1 capful every 12 hours. If after two weeks there is no improvement, increase to 1 capful every 8 hours 3. Stop benefiber for now.   All questions were answered, patient verbalized understanding and is in agreement with plan as outlined above.   Follow Up: 4 months   Strummer Canipe L. Jeanmarie Hubert, MSN, APRN, AGNP-C Adult-Gerontology Nurse Practitioner [facility]  I have reviewed the  note and agree with the APP's assessment as described in this progress note  Katrinka Blazing, MD Gastroenterology and Hepatology [facility] Gastroenterology

## 2022-12-24 ENCOUNTER — Other Ambulatory Visit (HOSPITAL_COMMUNITY): Payer: Self-pay | Admitting: Cardiology

## 2023-02-05 ENCOUNTER — Other Ambulatory Visit: Payer: Self-pay | Admitting: Nurse Practitioner

## 2023-02-15 ENCOUNTER — Ambulatory Visit (HOSPITAL_COMMUNITY)
Admission: RE | Admit: 2023-02-15 | Discharge: 2023-02-15 | Disposition: A | Discharge status: Home / Self Care | Payer: Medicaid Other | Source: Ambulatory Visit | Attending: Cardiology | Admitting: Cardiology

## 2023-02-15 ENCOUNTER — Encounter (HOSPITAL_COMMUNITY): Payer: Self-pay | Admitting: Cardiology

## 2023-02-15 ENCOUNTER — Telehealth (HOSPITAL_COMMUNITY): Payer: Self-pay | Admitting: *Deleted

## 2023-02-15 VITALS — BP 102/60 | HR 66 | Wt 185.4 lb

## 2023-02-15 DIAGNOSIS — M542 Cervicalgia: Secondary | ICD-10-CM | POA: Insufficient documentation

## 2023-02-15 DIAGNOSIS — M79651 Pain in right thigh: Secondary | ICD-10-CM | POA: Insufficient documentation

## 2023-02-15 DIAGNOSIS — R42 Dizziness and giddiness: Secondary | ICD-10-CM | POA: Insufficient documentation

## 2023-02-15 DIAGNOSIS — M79652 Pain in left thigh: Secondary | ICD-10-CM | POA: Insufficient documentation

## 2023-02-15 DIAGNOSIS — J449 Chronic obstructive pulmonary disease, unspecified: Secondary | ICD-10-CM | POA: Diagnosis not present

## 2023-02-15 DIAGNOSIS — Z7984 Long term (current) use of oral hypoglycemic drugs: Secondary | ICD-10-CM | POA: Diagnosis not present

## 2023-02-15 DIAGNOSIS — I5022 Chronic systolic (congestive) heart failure: Secondary | ICD-10-CM | POA: Insufficient documentation

## 2023-02-15 DIAGNOSIS — I428 Other cardiomyopathies: Secondary | ICD-10-CM | POA: Diagnosis not present

## 2023-02-15 DIAGNOSIS — E785 Hyperlipidemia, unspecified: Secondary | ICD-10-CM | POA: Insufficient documentation

## 2023-02-15 DIAGNOSIS — Z87891 Personal history of nicotine dependence: Secondary | ICD-10-CM | POA: Insufficient documentation

## 2023-02-15 DIAGNOSIS — I130 Hypertensive heart and chronic kidney disease with heart failure and stage 1 through stage 4 chronic kidney disease, or unspecified chronic kidney disease: Secondary | ICD-10-CM | POA: Diagnosis not present

## 2023-02-15 DIAGNOSIS — Z8249 Family history of ischemic heart disease and other diseases of the circulatory system: Secondary | ICD-10-CM | POA: Diagnosis not present

## 2023-02-15 DIAGNOSIS — N183 Chronic kidney disease, stage 3 unspecified: Secondary | ICD-10-CM | POA: Insufficient documentation

## 2023-02-15 DIAGNOSIS — E1122 Type 2 diabetes mellitus with diabetic chronic kidney disease: Secondary | ICD-10-CM | POA: Insufficient documentation

## 2023-02-15 DIAGNOSIS — Z79899 Other long term (current) drug therapy: Secondary | ICD-10-CM | POA: Diagnosis not present

## 2023-02-15 LAB — BASIC METABOLIC PANEL
Anion gap: 8 (ref 5–15)
BUN: 40 mg/dL — ABNORMAL HIGH (ref 8–23)
CO2: 24 mmol/L (ref 22–32)
Calcium: 10.1 mg/dL (ref 8.9–10.3)
Chloride: 104 mmol/L (ref 98–111)
Creatinine, Ser: 1.96 mg/dL — ABNORMAL HIGH (ref 0.44–1.00)
GFR, Estimated: 29 mL/min — ABNORMAL LOW (ref >60)
Glucose, Bld: 146 mg/dL — ABNORMAL HIGH (ref 70–99)
Potassium: 4.1 mmol/L (ref 3.5–5.1)
Sodium: 136 mmol/L (ref 135–145)

## 2023-02-15 LAB — BRAIN NATRIURETIC PEPTIDE: B Natriuretic Peptide: 10.3 pg/mL (ref 0.0–100.0)

## 2023-02-15 MED ORDER — SPIRONOLACTONE 25 MG PO TABS: 25.0000 mg | ORAL_TABLET | Freq: Every day | ORAL | 3 refills | Status: DC

## 2023-02-15 MED ORDER — SPIRONOLACTONE 25 MG PO TABS: 12.5000 mg | ORAL_TABLET | Freq: Every day | ORAL | 3 refills | Status: DC

## 2023-02-15 NOTE — Patient Instructions (Signed)
Start taking your Cleda Daub at bedtime.  Labs today - will call you if abnormal. Return to Heart Failure [facility] in 6 months. Please call us at [phone] if any questions or concerns prior to your next visit.

## 2023-02-15 NOTE — Telephone Encounter (Signed)
Called patient per Dr. Shirlee Latch with following lab results and instructions:  "With creatinine higher and BP low at times, decrease spironolactone to 12.5 daily."  Pt verbalized understanding of same.

## 2023-02-17 NOTE — Progress Notes (Signed)
Advanced Heart Failure Clinic Note   PCP: Catalina Lunger, DO Primary Cardiologist: Nona Dell, MD  HF Cardiology: Dr. Shirlee Latch  Vascular: Dr. Arbie Cookey   HPI: 62 y.o. female w/ history of DM, HTN, hyperlipidemia, and smoking.  She was admitted on 1/22 for about 1 week of shortness of breath. She had been started on azithromycin and prednisone by PCP and was taking inhalers for ?COPD exacerbation without relief.  COVID negative.  Dyspnea primarily exertional, no orthopnea. No chest pain reported.  In ER at [facility], pro-BNP was elevated and CXR suggestive of volume overload. Slightly elevated HS-TnI with no trend, not suggestive of ACS.  She was admitted for management of new diagnosis of CHF and seen by Dr. Diona Browner.  Echo was done, showed EF 20-25% with mild RV dysfunction. She was started on IV Lasix for diuresed then transitioned to PO.  She was transferred from [facility] to [facility] for RHC/LHC to assess filling pressures/CO and also to assess for coronary disease as etiology.    Of note, her father developed CHF in his 59s, nonischemic cardiomyopathy.  He had ICD and later heart transplant.  Grandmother also had CHF.   Cath was done 1/21 and showed no significant coronary disease and optimized filling pressures with preserved cardiac output (see angiographic and hemodynamic data below). Diagnosed w/ NICM. cMRI showed no myocardial LGE, so no definitive evidence for prior MI, infiltrative disease, or cardiomyopathy. RV normal w/ EF 35%. She was started on GDMT w/ ARB, ? blocker and MRA + loop diuretic.   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 Jomarie Longs, thought to be probably be a benign variant.   Echo 5/21 with EF 30-35% (closer to 35%), diffuse hypokinesis, mildly decreased RV systolic function, normal IVC. Echo in 11/21 showed EF 45%, diffuse hypokinesis, mildly decreased RV systolic function.  Echo in 11/22 showed stable EF 45-50%.   2022 ABI - Noninvasive  test from [facility] were reviewed.  This revealed ankle arm index of 0.68 on the right and 1.0 on the left  ABI 3/24 showed moderate PAD in R leg, stable from prior study.  Echo 3/24 with EF 50-55%, RV normal.   Today she returns for HF follow up. No dyspnea walking on flat ground.  No chest pain.  She raises the head of her bed chronically due to neck pain.  Occasional wheezing.  She does get bilateral buttock/thigh pain with walking.  Weight is up.  She remains off cigarettes. BP runs low at times and she does have occasional lightheadedness with standing.    Labs (10/23): LDL 70 Labs (12/23): K 3.3, creatinine 1.25 Labs (2/24): K 3.4, creatinine 1.46, BNP 51 Labs (3/24): K 4, creatinine 1.59 Labs (9/24): LDL 49  ECG (personally reviewed): NSR, LBBB 126 msec  PMH: 1. Hyperlipidemia 2. Type 2 diabetes.  3. Chronic systolic CHF:  - Echo (1/21).  EF 20-25%, mildly decreased RV function.  - RHC/LHC (1/21): Nonobstructive CAD; mean RA 9, PA 28/5, mean PCWP 9, CI 2.37 - Cardiac MRI (1/21): LV EF 31%, RVEF 28%, No myocardial LGE.  - Echo (5/21): EF 30-35% (closer to 35%), diffuse hypokinesis, mildly decreased RV systolic function, normal IVC. 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  - Echo (11/21): EF 45%, diffuse hypokinesis, mildly decreased RV systolic function.  - Echo (11/22): EF 45-50%  - Echo (3/24): EF 50-55%, RV normal.  4.  Smoker: Quit in 2023 with Chantix.  - PFTs (6/24): Minimal obstruction 5. CKD stage 3 6. PAD: ABIs (2022) with 0.68 on right and 1.0 on left.  - ABIs (3/24): 0.77 on right, 1.0 on left  Review of Systems: All systems reviewed and negative except as per HPI.   Current Outpatient Medications  Medication Sig Dispense Refill   Accu-Chek FastClix Lancets MISC USE TO CHECK TWICE DAILY. 102 each 0   aspirin EC 81 MG tablet Take 81 mg by mouth daily. Swallow whole.     atorvastatin  (LIPITOR) 80 MG tablet TAKE ONE TABLET (80MG  TOTAL) BY MOUTH DAILY 90 tablet 3   Blood Glucose Monitoring Suppl (ACCU-CHEK GUIDE ME) w/Device KIT Use to monitor blood glucose twice daily, before breakfast and before bed. 1 kit 1   Blood Pressure Monitoring (BLOOD PRESSURE CUFF) MISC 1 Device by Does not apply route as directed. To monitor blood pressure as needed daily dx: I50.9 1 each 0   buPROPion (WELLBUTRIN SR) 150 MG 12 hr tablet Take 1 tab daily for 3 days, then take 1 tab Twice daily 180 tablet 3   busPIRone (BUSPAR) 7.5 MG tablet Take 7.5 mg by mouth 2 (two) times daily.     carvedilol (COREG) 6.25 MG tablet TAKE ONE TABLET BY MOUTH TWICE A DAY WITH A MEAL 180 tablet 1   Cholecalciferol (VITAMIN D3) 50 MCG (2000 UT) capsule Take 2,000 Units by mouth daily.     ENTRESTO 24-26 MG TAKE ONE TABLET BY MOUTH TWICE A DAY 60 tablet 11   FARXIGA 10 MG TABS tablet TAKE ONE TABLET (10MG  TOTAL) BY MOUTH DAILY 30 tablet 11   fenofibrate (TRICOR) 145 MG tablet TAKE ONE TABLET (145 MG TOTAL) BY MOUTH DAILY. 30 tablet 6   FLUoxetine (PROZAC) 40 MG capsule Take 40 mg by mouth daily.     fluticasone (FLONASE) 50 MCG/ACT nasal spray Place 1 spray into both nostrils daily.     glipiZIDE (GLUCOTROL) 5 MG tablet TAKE ONE TABLET (5MG  TOTAL) BY MOUTH DAILY BEFORE BREAKFAST 90 tablet 3   glucose blood (ACCU-CHEK GUIDE) test strip USE TO TEST TWICE DAILY. 50 strip PRN   levocetirizine (XYZAL) 5 MG tablet Take 5 mg by mouth every evening.     loperamide (IMODIUM) 2 MG capsule TAKE 2 CAPSULES BY MOUTH INITIALLY, THEN TAKE 1 CAPSULE AFTER EACH LOOSE STOOL. DO NOT EXCEED 8 CAPSULES IN 24 HOURS. 60 capsule 2   OVER THE COUNTER MEDICATION Calcium citrate 600mg  - takes 2 daily     pantoprazole (PROTONIX) 40 MG tablet Take 1 tablet (40 mg total) by mouth daily. 90 tablet 3   sodium chloride (OCEAN) 0.65 % SOLN nasal spray Place 1 spray into both nostrils 2 times daily at 12 noon and 4 pm.     zolpidem (AMBIEN) 10 MG tablet  Take 10 mg by mouth at bedtime as needed for sleep.     spironolactone (ALDACTONE) 25 MG tablet Take 0.5 tablets (12.5 mg total) by mouth at bedtime. TAKE ONE TABLET (25MG  TOTAL) BY MOUTH DAILY 45 tablet 3   No current facility-administered medications for this encounter.   Allergies  Allergen Reactions   Metformin     Causes GI issues   Social History   Socioeconomic History   Marital status: Divorced    Spouse name: Not on file   Number of children: Not on file   Years of education: Not on file   Highest education level: Not on file  Occupational History   Not on file  Tobacco Use   Smoking status: Former    Current packs/day: 0.00    Average packs/day: 0.3 packs/day for 37.0 years (9.3 ttl pk-yrs)    Types: Cigarettes    Start date: 11/19/1984    Quit date: 11/19/2021    Years since quitting: 1.2    Passive exposure: Never   Smokeless tobacco: Never   Tobacco comments:    1-2 cigarettes daily  Vaping Use   Vaping status: Never Used  Substance and Sexual Activity   Alcohol use: No   Drug use: No   Sexual activity: Yes    Birth control/protection: None  Other Topics Concern   Not on file  Social History Narrative   Not on file   Social Drivers of Health   Financial Resource Strain: Low Risk  (05/31/2021)   Received from [facility]   Overall Financial Resource Strain (CARDIA)  Food Insecurity: No Food Insecurity (05/31/2021)   Received from [facility]   Hunger Vital Sign  Transportation Needs: No Transportation Needs (05/31/2021)   Received from [facility]   PRAPARE - Transportation  Physical Activity: Insufficiently Active (09/28/2020)   Received from [facility], [facility]   Exercise Vital Sign    Days of Exercise per Week: 5 days    Minutes of Exercise per Session: 10 min  Stress: No Stress Concern Present (05/31/2021)   Received from [facility] - Occupational Stress Questionnaire  Social  Connections: Moderately Isolated (09/28/2020)   Received from [facility], [facility]   Social Connection and Isolation Panel [NHANES]    Frequency of Communication with Friends and Family: Once a week    Frequency of Social Gatherings with Friends and Family: Once a week    Attends Religious Services: 1 to 4 times per year    Active Member of [employer] or Organizations: No    Attends Engineer, structural: 1 to 4 times per year    Marital Status: Divorced  Catering manager Violence: Not At Risk (06/18/2022)   Received from [facility], [facility]   Humiliation, Afraid, Rape, and Kick questionnaire    Fear of Current or Ex-Partner: No    Emotionally Abused: No    Physically Abused: No    Sexually Abused: No    Family History  Problem Relation Age of Onset   Arthritis Mother    Hernia Mother    Constipation Mother    Heart failure Father        Underwent heart transplant   Colon cancer Neg Hx    BP 102/60   Pulse 66   Wt 84.1 kg (185 lb 6.4 oz)   LMP 09/25/2011   SpO2 96%   BMI 29.04 kg/m   Wt Readings from Last 3 Encounters:  02/15/23 84.1 kg (185 lb 6.4 oz)  12/20/22 84.1 kg (185 lb 8 oz)  08/24/22 82.9 kg (182 lb 12.8 oz)   PHYSICAL EXAM: General: NAD Neck: No JVD, no thyromegaly or thyroid nodule.  Lungs: Clear to auscultation bilaterally with normal respiratory effort. CV: Nondisplaced PMI.  Heart regular S1/S2, no S3/S4, no murmur.  No peripheral edema.  No carotid bruit.  Difficult top palpate pedal pulses.  Abdomen: Soft, nontender, no hepatosplenomegaly, no distention.  Skin: Intact without lesions or rashes.  Neurologic: Alert and oriented x 3.  Psych: Normal affect. Extremities: No clubbing  or cyanosis.  HEENT: Normal.   ASSESSMENT & PLAN: 1. Chronic Systolic CHF: Echo 1/21 w/ EF 16-10%, mild RV dysfunction.  No prior cardiac history but father has history of nonischemic cardiomyopathy with heart transplant and paternal grandmother  also had CHF.  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  LHC/RHC 1/21 showed no significant coronary disease and optimized filling pressures with preserved cardiac output. cMRI showed no myocardial LGE, so no definitive evidence for prior MI, infiltrative disease, or cardiomyopathy.  Echo 5/21 showed mild improvement in LV function, EF 30-35% (probably closer to 35%).  Echo in 11/22 with improved EF 45-50%. Echo in 3/24 with EF 50-55%, RV normal.   NYHA class II, occasional wheezing likely due to COPD. She is not volume overloaded.  - Continue Coreg 6.25 mg bid.  - Continue Entresto 24-26 bid. BMET/BNP today.  - Continue spironolactone 25 mg daily, move to at bedtime given occasional lightheadedness with standing and soft BP.  - Continue dapagliflozin 10 mg daily.  2. Smoking: She was a 30 year, 1/2 ppd smoker. She has been off cigarettes for 1.5 yrs now.  3. HTN: BP is now low at times.  4. Type 2 diabetes: Sees an endocrinologist.  - Continue Marcelline Deist. 5. Hyperlipidemia: Continue statin, good lipids in 9/24.   6. PAD: Claudication of the thighs and buttocks has continued to bother her, even though she has quit smoking. No rest pain or pedal ulcerations. ABIs (3/24) showed moderate PAD in R leg, stable from prior study. She will contact [facility] if pain worsens, or occurs with rest.  Follow up in 6 months with APP  Marca Ancona, MD 02/17/23

## 2023-02-25 ENCOUNTER — Other Ambulatory Visit (INDEPENDENT_AMBULATORY_CARE_PROVIDER_SITE_OTHER): Payer: Self-pay | Admitting: Gastroenterology

## 2023-02-26 ENCOUNTER — Ambulatory Visit: Payer: Medicaid Other | Admitting: Nurse Practitioner

## 2023-02-26 ENCOUNTER — Encounter: Payer: Self-pay | Admitting: Nurse Practitioner

## 2023-02-26 VITALS — BP 98/64 | HR 65 | Ht 67.0 in | Wt 186.4 lb

## 2023-02-26 DIAGNOSIS — F17200 Nicotine dependence, unspecified, uncomplicated: Secondary | ICD-10-CM

## 2023-02-26 DIAGNOSIS — E1122 Type 2 diabetes mellitus with diabetic chronic kidney disease: Secondary | ICD-10-CM | POA: Diagnosis not present

## 2023-02-26 DIAGNOSIS — N1832 Chronic kidney disease, stage 3b: Secondary | ICD-10-CM

## 2023-02-26 DIAGNOSIS — Z7984 Long term (current) use of oral hypoglycemic drugs: Secondary | ICD-10-CM

## 2023-02-26 DIAGNOSIS — I10 Essential (primary) hypertension: Secondary | ICD-10-CM | POA: Diagnosis not present

## 2023-02-26 DIAGNOSIS — E782 Mixed hyperlipidemia: Secondary | ICD-10-CM | POA: Diagnosis not present

## 2023-02-26 LAB — POCT GLYCOSYLATED HEMOGLOBIN (HGB A1C): Hemoglobin A1C: 6.8 % — AB (ref 4.0–5.6)

## 2023-02-26 MED ORDER — ACCU-CHEK FASTCLIX LANCETS MISC: 6 refills | Status: DC

## 2023-02-26 NOTE — Progress Notes (Signed)
 02/26/2023, 9:44 AM  Endocrinology follow-up note   Subjective:    Patient ID: [name], female    DOB: 04/10/1960.  [name] is being seen in follow-up after she was seen in consultation for management of currently uncontrolled symptomatic diabetes requested by  Tobie Guy, DO.   Past Medical History:  Diagnosis Date   Anal fissure    Anxiety    Chronic systolic (congestive) heart failure (HCC)    a. 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12   Mixed hyperlipidemia    Nonischemic cardiomyopathy (HCC)    Type 2 diabetes mellitus (HCC)     Past Surgical History:  Procedure Laterality Date   BACK SURGERY  2005   lumbar disckectomy   COLONOSCOPY  10/01/2011   Procedure: COLONOSCOPY;  Surgeon: Claudis RAYMOND Rivet, MD;  [facility];  Service: Endoscopy;  Laterality: N/A;  730   COLONOSCOPY N/A 10/04/2016   Procedure: COLONOSCOPY;  Surgeon: Rivet Claudis RAYMOND, MD;  [facility];  Service: Endoscopy;  Laterality: N/A;  10:30   COLONOSCOPY WITH PROPOFOL  N/A 02/13/2022   Procedure: COLONOSCOPY WITH PROPOFOL ;  Surgeon: Eartha Angelia Sieving, MD;  [facility]: [facility];  Service: Gastroenterology;  Laterality: N/A;  945 am ASA 3   DILATION AND CURETTAGE OF UTERUS     FOOT BONE EXCISION     right   POLYPECTOMY  10/04/2016   Procedure: POLYPECTOMY;  Surgeon: Rivet Claudis RAYMOND, MD;  [facility]: [facility];  Service: Endoscopy;;  colon   POLYPECTOMY  02/13/2022   Procedure: POLYPECTOMY;  Surgeon: Eartha Angelia Sieving, MD;  [facility]: [facility];  Service: Gastroenterology;;   RIGHT/LEFT HEART CATH AND CORONARY ANGIOGRAPHY N/A 03/23/2019   Procedure: RIGHT/LEFT HEART CATH AND CORONARY ANGIOGRAPHY;  Surgeon: Rolan Ezra RAMAN, MD;   Location: [facility];  Service: Cardiovascular;  Laterality: N/A;    Social History   Socioeconomic History   Marital status: Divorced    Spouse name: Not on file   Number of children: Not on file   Years of education: Not on file   Highest education level: Not on file  Occupational History   Not on file  Tobacco Use   Smoking status: Former    Current packs/day: 0.00    Average packs/day: 0.3 packs/day for 37.0 years (9.3 ttl pk-yrs)    Types: Cigarettes    Start date: 11/19/1984    Quit date: 11/19/2021    Years since quitting: 1.2    Passive exposure: Never   Smokeless tobacco: Never   Tobacco comments:    1-2 cigarettes daily  Vaping Use   Vaping status: Never Used  Substance and Sexual Activity   Alcohol use: No   Drug use: No   Sexual activity: Yes    Birth control/protection: None  Other Topics Concern   Not on file  Social History Narrative   Not on file   Social Drivers of Health   Financial Resource Strain: Low  Risk  (05/31/2021)   Received from [facility]   Overall Financial Resource Strain (CARDIA)  Food Insecurity: No Food Insecurity (05/31/2021)   Received from [facility]   Hunger Vital Sign  Transportation Needs: No Transportation Needs (05/31/2021)   Received from [facility]   PRAPARE - Transportation  Physical Activity: Insufficiently Active (09/28/2020)   Received from [facility], [facility]   Exercise Vital Sign    Days of Exercise per Week: 5 days    Minutes of Exercise per Session: 10 min  Stress: No Stress Concern Present (05/31/2021)   Received from [facility] - Occupational Stress Questionnaire  Social Connections: Moderately Isolated (09/28/2020)   Received from [facility], [facility]   Social Connection and Isolation Panel [NHANES]    Frequency of Communication with Friends and Family: Once a week    Frequency of Social Gatherings with Friends and Family:  Once a week    Attends Religious Services: 1 to 4 times per year    Active Member of [employer] or Organizations: No    Attends Engineer, Structural: 1 to 4 times per year    Marital Status: Divorced    Family History  Problem Relation Age of Onset   Arthritis Mother    Hernia Mother    Constipation Mother    Heart failure Father        Underwent heart transplant   Colon cancer Neg Hx     Outpatient Encounter Medications as of 02/26/2023  Medication Sig   aspirin  EC 81 MG tablet Take 81 mg by mouth daily. Swallow whole.   atorvastatin  (LIPITOR) 80 MG tablet TAKE ONE TABLET (80MG  TOTAL) BY MOUTH DAILY   Blood Glucose Monitoring Suppl (ACCU-CHEK GUIDE ME) w/Device KIT Use to monitor blood glucose twice daily, before breakfast and before bed.   Blood Pressure Monitoring (BLOOD PRESSURE CUFF) MISC 1 Device by Does not apply route as directed. To monitor blood pressure as needed daily dx: I50.9   busPIRone (BUSPAR) 7.5 MG tablet Take 7.5 mg by mouth 2 (two) times daily.   carvedilol  (COREG ) 6.25 MG tablet TAKE ONE TABLET BY MOUTH TWICE A DAY WITH A MEAL   Cholecalciferol (VITAMIN D3) 50 MCG (2000 UT) capsule Take 2,000 Units by mouth daily.   ENTRESTO  24-26 MG TAKE ONE TABLET BY MOUTH TWICE A DAY   FARXIGA  10 MG TABS tablet TAKE ONE TABLET (10MG  TOTAL) BY MOUTH DAILY   fenofibrate  (TRICOR ) 145 MG tablet TAKE ONE TABLET (145 MG TOTAL) BY MOUTH DAILY.   FLUoxetine  (PROZAC ) 40 MG capsule Take 40 mg by mouth daily.   fluticasone (FLONASE) 50 MCG/ACT nasal spray Place 1 spray into both nostrils daily.   glipiZIDE  (GLUCOTROL ) 5 MG tablet TAKE ONE TABLET (5MG  TOTAL) BY MOUTH DAILY BEFORE BREAKFAST   glucose blood (ACCU-CHEK GUIDE) test strip USE TO TEST TWICE DAILY.   levocetirizine (XYZAL) 5 MG tablet Take 5 mg by mouth every evening.   loperamide  (IMODIUM ) 2 MG capsule TAKE 2 CAPSULES BY MOUTH INITIALLY, THEN TAKE 1 CAPSULE AFTER EACH LOOSE STOOL. DO NOT EXCEED 8 CAPSULES IN 24 HOURS.    OVER THE COUNTER MEDICATION Calcium  citrate 600mg  - takes 2 daily   pantoprazole  (PROTONIX ) 40 MG tablet TAKE ONE TABLET (40MG  TOTAL) BY MOUTH DAILY   sodium chloride  (OCEAN) 0.65 % SOLN nasal spray Place 1 spray into both nostrils 2 times daily at 12  noon and 4 pm.   spironolactone  (ALDACTONE ) 25 MG tablet Take 0.5 tablets (12.5 mg total) by mouth at bedtime. TAKE ONE TABLET (25MG  TOTAL) BY MOUTH DAILY   zolpidem  (AMBIEN ) 10 MG tablet Take 10 mg by mouth at bedtime as needed for sleep.   [DISCONTINUED] Accu-Chek FastClix Lancets MISC USE TO CHECK TWICE DAILY.   Accu-Chek FastClix Lancets MISC USE TO CHECK TWICE DAILY.   buPROPion  (WELLBUTRIN  SR) 150 MG 12 hr tablet Take 1 tab daily for 3 days, then take 1 tab Twice daily   [DISCONTINUED] Accu-Chek FastClix Lancets MISC USE TO CHECK TWICE DAILY.   [DISCONTINUED] pantoprazole  (PROTONIX ) 40 MG tablet Take 1 tablet (40 mg total) by mouth daily.   No facility-administered encounter medications on file as of 02/26/2023.    ALLERGIES: Allergies  Allergen Reactions   Metformin     Causes GI issues    VACCINATION STATUS:  There is no immunization history on file for this patient.  Diabetes She presents for her follow-up diabetic visit. She has type 2 diabetes mellitus. Onset time: She was diagnosed at approximate age of 31 years. Her disease course has been stable. There are no hypoglycemic associated symptoms. Pertinent negatives for hypoglycemia include no confusion, headaches, pallor or seizures. Associated symptoms include fatigue. Pertinent negatives for diabetes include no chest pain, no polydipsia, no polyphagia and no polyuria. There are no hypoglycemic complications. Symptoms are stable. Diabetic complications include heart disease and nephropathy. Risk factors for coronary artery disease include dyslipidemia, diabetes mellitus, obesity, sedentary lifestyle, post-menopausal and tobacco exposure. Current diabetic treatment includes oral  agent (dual therapy). She is compliant with treatment most of the time. Her weight is fluctuating minimally. She is following a generally unhealthy diet. When asked about meal planning, she reported none. She has not had a previous visit with a dietitian. She rarely participates in exercise. Her home blood glucose trend is fluctuating minimally. Her overall blood glucose range is 140-180 mg/dl. (She presents today with her meter and logs showing stable, at goal glycemic profile overall.  Her POCT A1c today is 6.8%, increasing from last visit of 6.6%.  She denies any hypoglycemia.  Analysis of her meter shows 7-day average of 142, 14-day average of 144, 30-day average of 154, 90-day average of 156.  ) An ACE inhibitor/angiotensin II receptor blocker is being taken. Eye exam is current.  Hyperlipidemia This is a chronic problem. The current episode started more than 1 year ago. The problem is uncontrolled. Recent lipid tests were reviewed and are high. Exacerbating diseases include chronic renal disease, diabetes and obesity. Factors aggravating her hyperlipidemia include fatty foods, beta blockers and smoking. Pertinent negatives include no chest pain, myalgias or shortness of breath. Current antihyperlipidemic treatment includes statins and fibric acid derivatives. The current treatment provides mild improvement of lipids. Compliance problems include adherence to diet, adherence to exercise and psychosocial issues.  Risk factors for coronary artery disease include dyslipidemia, diabetes mellitus, obesity, a sedentary lifestyle and post-menopausal.  Hypertension This is a chronic problem. The current episode started more than 1 year ago. The problem has been resolved since onset. The problem is controlled. Pertinent negatives include no chest pain, headaches, palpitations or shortness of breath. There are no associated agents to hypertension. Risk factors for coronary artery disease include diabetes mellitus,  dyslipidemia, obesity, smoking/tobacco exposure, sedentary lifestyle and post-menopausal state. Past treatments include ACE inhibitors, beta blockers, angiotensin blockers and diuretics. The current treatment provides significant improvement. Compliance problems include diet and exercise.  Hypertensive end-organ damage includes kidney disease and heart failure. Identifiable causes of hypertension include chronic renal disease.    Review of systems  Constitutional: + Minimally fluctuating body weight,  current Body mass index is 29.19 kg/m. , no fatigue, no subjective hyperthermia, no subjective hypothermia Eyes: no blurry vision, no xerophthalmia ENT: no sore throat, no nodules palpated in throat, no dysphagia/odynophagia, no hoarseness Cardiovascular: no chest pain, no shortness of breath, no palpitations, no leg swelling Respiratory: no cough, no shortness of breath Gastrointestinal: no nausea/vomiting/diarrhea Musculoskeletal: no muscle/joint aches Skin: no rashes, no hyperemia Neurological: no tremors, no numbness, no tingling, no dizziness Psychiatric: no depression, no anxiety   Objective:    BP 98/64 (BP Location: Left Arm, Patient Position: Sitting, Cuff Size: Large)   Pulse 65   Ht 5' 7 (1.702 m)   Wt 186 lb 6.4 oz (84.6 kg)   LMP 09/25/2011   BMI 29.19 kg/m   Wt Readings from Last 3 Encounters:  02/26/23 186 lb 6.4 oz (84.6 kg)  02/15/23 185 lb 6.4 oz (84.1 kg)  12/20/22 185 lb 8 oz (84.1 kg)    BP Readings from Last 3 Encounters:  02/26/23 98/64  02/15/23 102/60  12/20/22 (!) 90/52    Physical Exam- Limited  Constitutional:  Body mass index is 29.19 kg/m. , not in acute distress, normal state of mind Eyes:  EOMI, no exophthalmos Musculoskeletal: no gross deformities, strength intact in all four extremities, no gross restriction of joint movements Skin:  no rashes, no hyperemia Neurological: no tremor with outstretched hands  Diabetic Foot Exam - Simple    Simple Foot Form Diabetic Foot exam was performed with the following findings: Yes 02/26/2023  9:41 AM  Visual Inspection No deformities, no ulcerations, no other skin breakdown bilaterally: Yes Sensation Testing Intact to touch and monofilament testing bilaterally: Yes Pulse Check Posterior Tibialis and Dorsalis pulse intact bilaterally: Yes Comments     CMP ( most recent) CMP     Component Value Date/Time   NA 136 02/15/2023 1210   NA 139 07/15/2019 0825   K 4.1 02/15/2023 1210   CL 104 02/15/2023 1210   CO2 24 02/15/2023 1210   GLUCOSE 146 (H) 02/15/2023 1210   BUN 40 (H) 02/15/2023 1210   BUN 27 (H) 07/15/2019 0825   CREATININE 1.96 (H) 02/15/2023 1210   CREATININE 1.12 (H) 06/05/2019 0710   CALCIUM  10.1 02/15/2023 1210   PROT 6.5 09/29/2021 1237   ALBUMIN 4.2 09/29/2021 1237   AST 17 09/29/2021 1237   ALT 16 09/29/2021 1237   ALKPHOS 78 09/29/2021 1237   BILITOT 0.5 09/29/2021 1237   GFRNONAA 29 (L) 02/15/2023 1210   GFRAA 54 (L) 10/02/2019 1228     Diabetic Labs (most recent): Lab Results  Component Value Date   HGBA1C 6.8 (A) 02/26/2023   HGBA1C 6.6 (A) 08/24/2022   HGBA1C 6.8 (A) 02/21/2022   MICROALBUR 10 08/04/2020    Assessment & Plan:   1) Diabetes mellitus with stage 3 chronic kidney disease (HCC)  - [name] has currently uncontrolled symptomatic type 2 DM since  63 years of age.  She presents today with her meter and logs showing stable, at goal glycemic profile overall.  Her POCT A1c today is 6.8%, increasing from last visit of 6.6%.  She denies any hypoglycemia.  Analysis of her meter shows 7-day average of 142, 14-day average of 144, 30-day average of 154, 90-day average of 156.   - I had a long  discussion with her about the progressive nature of diabetes and the pathology behind its complications. -her diabetes is complicated by CHF, CKD stage 3b , smoking and she remains at a high risk for more acute and chronic complications which  include CAD, CVA, CKD, retinopathy, and neuropathy. These are all discussed in detail with her.  - Nutritional counseling repeated at each appointment due to patients tendency to fall back in to old habits.  - The patient admits there is a room for improvement in their diet and drink choices. -  Suggestion is made for the patient to avoid simple carbohydrates from their diet including Cakes, Sweet Desserts / Pastries, Ice Cream, Soda (diet and regular), Sweet Tea, Candies, Chips, Cookies, Sweet Pastries, Store Bought Juices, Alcohol in Excess of 1-2 drinks a day, Artificial Sweeteners, Coffee Creamer, and Sugar-free Products. This will help patient to have stable blood glucose profile and potentially avoid unintended weight gain.   - I encouraged the patient to switch to unprocessed or minimally processed complex starch and increased protein intake (animal or plant source), fruits, and vegetables.   - Patient is advised to stick to a routine mealtimes to eat 3 meals a day and avoid unnecessary snacks (to snack only to correct hypoglycemia).  - I have approached her with the following individualized plan to manage her diabetes and patient agrees:   - She did not tolerate Metformin.  -She is advised to continue her Farxiga  10 mg po daily (as prescribed by cardiology) and her Glipizide  5 mg PO daily with breakfast.   -She is encouraged to continue monitoring blood glucose twice daily, before breakfast and before bed, and to call the clinic if she has readings less than 70 or greater than 200 for 3 tests in a row.    - she is not a suitable candidate for incretin therapy either due to her body habitus as well as chronic smoking history, posing risk for pancreatitis.  -Addition of basal insulin  would be the safest option if she loses control on subsequent visits.  - Specific targets for  A1c;  LDL, HDL,  and Triglycerides were discussed with the patient.  2) Blood Pressure /Hypertension:  -Her  blood pressure is controlled to target- low today.   she is advised to continue her current medications including Entresto  24/26 mg p.o. daily with breakfast, Carvedilol  6.25 mg p.o. twice daily as well as Spironolactone  25 mg p.o. daily. Med changes will be deferred to her cardiologist/nephrologist.  3) Lipids/Hyperlipidemia:    Her most recent lipid panel from 12/01/21 shows controlled LDL of 70.  She is advised to continue Atorvastatin  80 mg po daily at bedtime and Fenofibrate  48 mg po daily.  Side effects and precautions discussed with her.  4)  Weight/Diet:  Her Body mass index is 29.19 kg/m.  -She is a candidate for some weight loss. I discussed with her the fact that loss of 5 - 10% of her  current body weight will have the most impact on her diabetes management.  Exercise, and detailed carbohydrates information provided  -  detailed on discharge instructions.  5) Chronic Care/Health Maintenance: -she is on ACEI/ARB and Statin medications and  is encouraged to initiate and continue to follow up with Ophthalmology, Dentist, orthopedic surgeon, cardiology, podiatrist at least yearly or according to recommendations, and advised to  stay away from smoking (quit Sept 2023). I have recommended yearly flu vaccine and pneumonia vaccine at least every 5 years; moderate intensity exercise for up to  150 minutes weekly; and  sleep for at least 7 hours a day.     - she is advised to maintain close follow up with Tobie Guy, DO for primary care needs, as well as her other providers for optimal and coordinated care.      I spent  30  minutes in the care of the patient today including review of labs from CMP, Lipids, Thyroid Function, Hematology (current and previous including abstractions from other facilities); face-to-face time discussing  her blood glucose readings/logs, discussing hypoglycemia and hyperglycemia episodes and symptoms, medications doses, her options of short and long term treatment  based on the latest standards of care / guidelines;  discussion about incorporating lifestyle medicine;  and documenting the encounter. Risk reduction counseling performed per USPSTF guidelines to reduce obesity and cardiovascular risk factors.     Please refer to Patient Instructions for Blood Glucose Monitoring and Insulin /Medications Dosing Guide  in media tab for additional information. Please  also refer to  Patient Self Inventory in the Media  tab for reviewed elements of pertinent patient history.  [name] Roys participated in the discussions, expressed understanding, and voiced agreement with the above plans.  All questions were answered to her satisfaction. she is encouraged to contact clinic should she have any questions or concerns prior to her return visit.     Follow up plan: - Return in about 6 months (around 08/26/2023) for Diabetes F/U with A1c in office, No previsit labs, Bring meter and logs.  Benton Rio, [facility] [address] [place], [place] [address] Phone: [phone] Fax: [phone]  02/26/2023, 9:44 AM

## 2023-03-01 ENCOUNTER — Encounter (HOSPITAL_COMMUNITY): Payer: Self-pay

## 2023-03-01 ENCOUNTER — Emergency Department (HOSPITAL_COMMUNITY)
Admission: EM | Admit: 2023-03-01 | Discharge: 2023-03-01 | Disposition: A | Discharge status: Home / Self Care | Payer: Medicaid Other | Attending: Emergency Medicine | Admitting: Emergency Medicine

## 2023-03-01 ENCOUNTER — Other Ambulatory Visit: Payer: Self-pay

## 2023-03-01 ENCOUNTER — Emergency Department (HOSPITAL_COMMUNITY): Payer: Medicaid Other

## 2023-03-01 DIAGNOSIS — S93601A Unspecified sprain of right foot, initial encounter: Secondary | ICD-10-CM | POA: Diagnosis not present

## 2023-03-01 DIAGNOSIS — S0093XA Contusion of unspecified part of head, initial encounter: Secondary | ICD-10-CM | POA: Diagnosis not present

## 2023-03-01 DIAGNOSIS — Z79899 Other long term (current) drug therapy: Secondary | ICD-10-CM | POA: Diagnosis not present

## 2023-03-01 DIAGNOSIS — W01198A Fall on same level from slipping, tripping and stumbling with subsequent striking against other object, initial encounter: Secondary | ICD-10-CM | POA: Insufficient documentation

## 2023-03-01 DIAGNOSIS — S0012XA Contusion of left eyelid and periocular area, initial encounter: Secondary | ICD-10-CM | POA: Diagnosis not present

## 2023-03-01 DIAGNOSIS — S0990XA Unspecified injury of head, initial encounter: Secondary | ICD-10-CM

## 2023-03-01 DIAGNOSIS — S99921A Unspecified injury of right foot, initial encounter: Secondary | ICD-10-CM | POA: Diagnosis present

## 2023-03-01 DIAGNOSIS — Z7982 Long term (current) use of aspirin: Secondary | ICD-10-CM | POA: Insufficient documentation

## 2023-03-01 DIAGNOSIS — Z7984 Long term (current) use of oral hypoglycemic drugs: Secondary | ICD-10-CM | POA: Insufficient documentation

## 2023-03-01 DIAGNOSIS — W19XXXA Unspecified fall, initial encounter: Secondary | ICD-10-CM

## 2023-03-01 NOTE — ED Triage Notes (Signed)
 Pt fell yesterday hitting her head. Pt had bruise noted to left orbit. Pt states rt foot pain, no LOC only takes aspirin.

## 2023-03-01 NOTE — ED Provider Notes (Signed)
  EMERGENCY DEPARTMENT AT [facility] Provider Note   CSN: [id number] Arrival date & time: 03/01/23  1028     History  No chief complaint on file.   [name] is a 63 y.o. female.  She is here for evaluation of right foot pain after a fall yesterday.  She said she tripped on the porch.  She did hit her head but did not lose consciousness.  She said she has been having a lot of pain with weightbearing on her right foot since then.  She has some bruising of her left forehead and around her left eye.  No headache no visual symptoms.  She is on an aspirin  but no other blood thinners.  No numbness or weakness.  No chest pain or shortness of breath.  The history is provided by the patient.  Fall This is a new problem. The current episode started yesterday. The problem has not changed since onset.Pertinent negatives include no chest pain, no abdominal pain, no headaches and no shortness of breath. The symptoms are aggravated by walking. Nothing relieves the symptoms. She has tried rest for the symptoms. The treatment provided no relief.       Home Medications Prior to Admission medications   Medication Sig Start Date End Date Taking? Authorizing Provider  Accu-Chek FastClix Lancets MISC USE TO CHECK TWICE DAILY. 02/26/23   Therisa Benton PARAS, NP  aspirin  EC 81 MG tablet Take 81 mg by mouth daily. Swallow whole.    [provider]  atorvastatin  (LIPITOR) 80 MG tablet TAKE ONE TABLET (80MG  TOTAL) BY MOUTH DAILY 12/03/22   McLean, Dalton S, MD  Blood Glucose Monitoring Suppl (ACCU-CHEK GUIDE ME) w/Device KIT Use to monitor blood glucose twice daily, before breakfast and before bed. 03/20/21   Therisa Benton PARAS, NP  Blood Pressure Monitoring (BLOOD PRESSURE CUFF) MISC 1 Device by Does not apply route as directed. To monitor blood pressure as needed daily dx: I50.9 02/16/22   Rolan Ezra RAMAN, MD  buPROPion  (WELLBUTRIN  SR) 150 MG 12 hr tablet Take 1 tab daily for 3  days, then take 1 tab Twice daily 04/06/22   McLean, Dalton S, MD  busPIRone (BUSPAR) 7.5 MG tablet Take 7.5 mg by mouth 2 (two) times daily. 11/28/20   [provider]  carvedilol  (COREG ) 6.25 MG tablet TAKE ONE TABLET BY MOUTH TWICE A DAY WITH A MEAL 12/24/22   Rolan Ezra RAMAN, MD  Cholecalciferol (VITAMIN D3) 50 MCG (2000 UT) capsule Take 2,000 Units by mouth daily.    [provider]  ENTRESTO  24-26 MG TAKE ONE TABLET BY MOUTH TWICE A DAY 04/05/21   Rolan Ezra RAMAN, MD  FARXIGA  10 MG TABS tablet TAKE ONE TABLET (10MG  TOTAL) BY MOUTH DAILY 07/06/22   Rolan Ezra RAMAN, MD  fenofibrate  (TRICOR ) 145 MG tablet TAKE ONE TABLET (145 MG TOTAL) BY MOUTH DAILY. 12/24/22   Rolan Ezra RAMAN, MD  FLUoxetine  (PROZAC ) 40 MG capsule Take 40 mg by mouth daily.    [provider]  fluticasone (FLONASE) 50 MCG/ACT nasal spray Place 1 spray into both nostrils daily.    [provider]  glipiZIDE  (GLUCOTROL ) 5 MG tablet TAKE ONE TABLET (5MG  TOTAL) BY MOUTH DAILY BEFORE BREAKFAST 08/24/22   Reardon, Benton J, NP  glucose blood (ACCU-CHEK GUIDE) test strip USE TO TEST TWICE DAILY. 09/06/22   Therisa Benton PARAS, NP  levocetirizine (XYZAL) 5 MG tablet Take 5 mg by mouth every evening. 11/02/21   [provider]  loperamide  (IMODIUM ) 2 MG capsule TAKE 2 CAPSULES BY MOUTH INITIALLY, THEN TAKE 1 CAPSULE AFTER EACH LOOSE STOOL. DO NOT EXCEED 8 CAPSULES IN 24 HOURS. 12/19/21   Carlan, Mitzie CROME, NP  OVER THE COUNTER MEDICATION Calcium  citrate 600mg  - takes 2 daily    [provider]  pantoprazole  (PROTONIX ) 40 MG tablet TAKE ONE TABLET (40MG  TOTAL) BY MOUTH DAILY 02/25/23   Carlan, Chelsea L, NP  sodium chloride  (OCEAN) 0.65 % SOLN nasal spray Place 1 spray into both nostrils 2 times daily at 12 noon and 4 pm.    [provider]  spironolactone  (ALDACTONE ) 25 MG tablet Take 0.5 tablets (12.5 mg total) by mouth at bedtime. TAKE ONE TABLET (25MG  TOTAL) BY MOUTH DAILY  02/15/23   Rolan Ezra RAMAN, MD  zolpidem  (AMBIEN ) 10 MG tablet Take 10 mg by mouth at bedtime as needed for sleep.    [provider]      Allergies    Metformin    Review of Systems   Review of Systems  Eyes:  Negative for visual disturbance.  Respiratory:  Negative for shortness of breath.   Cardiovascular:  Negative for chest pain.  Gastrointestinal:  Negative for abdominal pain.  Musculoskeletal:  Positive for gait problem.  Neurological:  Negative for headaches.    Physical Exam Updated Vital Signs BP 125/65   Pulse (!) 59   Temp 98.2 F (36.8 C) (Oral)   Resp 18   Ht 5' 7 (1.702 m)   Wt 84.5 kg   LMP 09/25/2011   SpO2 99%   BMI 29.19 kg/m  Physical Exam Vitals and nursing note reviewed.  Constitutional:      General: She is not in acute distress.    Appearance: Normal appearance. She is well-developed.  HENT:     Head: Normocephalic.     Comments: She has bruising of her left forehead and left eye Eyes:     Extraocular Movements: Extraocular movements intact.     Conjunctiva/sclera: Conjunctivae normal.     Pupils: Pupils are equal, round, and reactive to light.  Cardiovascular:     Rate and Rhythm: Normal rate and regular rhythm.     Heart sounds: No murmur heard. Pulmonary:     Effort: Pulmonary effort is normal. No respiratory distress.     Breath sounds: Normal breath sounds.  Abdominal:     Palpations: Abdomen is soft.     Tenderness: There is no abdominal tenderness.  Musculoskeletal:        General: Tenderness present. No deformity.     Cervical back: Neck supple.     Comments: She has no hip knee or ankle tenderness on the right.  She is diffusely tender across the midfoot on the right.  There is no open wounds or significant swelling.  Distal pulses motor and sensation intact.  Skin:    General: Skin is warm and dry.     Capillary Refill: Capillary refill takes less than 2 seconds.  Neurological:     General: No focal deficit  present.     Mental Status: She is alert and oriented to person, place, and time.     Cranial Nerves: No cranial nerve deficit.     Sensory: No sensory deficit.     Motor: No weakness.     ED Results / Procedures / Treatments   Labs (all labs ordered are listed, but only abnormal results are displayed) Labs Reviewed - No data to display  EKG EKG Interpretation Date/Time:  Friday March 01 2023 10:46:44 EST Ventricular Rate:  67 PR Interval:  183 QRS Duration:  132 QT Interval:  400 QTC Calculation: 423 R Axis:   -71  Text Interpretation: Sinus rhythm Nonspecific IVCD with LAD Anterior infarct, old No significant change since prior 2/24 Confirmed by Towana Sharper [phone]) on 03/01/2023 10:51:37 AM  Radiology DG Foot Complete Right Result Date: 03/01/2023 CLINICAL DATA:  Status post fall.  Pain. EXAM: RIGHT FOOT COMPLETE - 3+ VIEW COMPARISON:  None Available. FINDINGS: Chronic postoperative changes identified with screws in the head of the second, third and fourth metatarsal bones. Remote healed deformity involving the fifth metatarsal bone identified. Second through fifth hammertoes. Mild hallux valgus with degenerative changes at the first MTP joint. No signs of acute fracture or dislocation. IMPRESSION: 1. No acute findings. 2. Chronic postoperative changes. 3. Second through fifth hammertoes. Electronically Signed   By: Waddell Calk M.D.   On: 03/01/2023 12:21    Procedures Procedures    Medications Ordered in ED Medications - No data to display  ED Course/ Medical Decision Making/ A&P                                 Medical Decision Making Amount and/or Complexity of Data Reviewed Radiology: ordered.   This patient complains of fall head injury right foot pain; this involves an extensive number of treatment Options and is a complaint that carries with it a high risk of complications and morbidity. The differential includes fracture, contusion, dislocation, bleed,  stroke I ordered imaging studies which included right foot x-ray and I independently    visualized and interpreted imaging which showed no acute findings Previous records obtained and reviewed including recent PCP and cardiology notes Social determinants considered, no significant barriers Critical Interventions: None  After the interventions stated above, I reevaluated the patient and found patient to be resting comfortably in no distress Admission and further testing considered, no indications for admission or further workup at this time.  Will place in support boot and recommended walker for weightbearing as tolerated.  Ice and elevation.  Orthopedic follow-up outpatient.  Return instructions discussed         Final Clinical Impression(s) / ED Diagnoses Final diagnoses:  Fall, initial encounter  Minor head injury, initial encounter  Sprain of right foot, initial encounter    Rx / DC Orders ED Discharge Orders     None         Towana Sharper BROCKS, MD 03/01/23 1753

## 2023-03-01 NOTE — Discharge Instructions (Signed)
 You were seen in the emergency department for right foot pain after a fall.  Your x-rays did not show any fracture or dislocation.  Please use ice to the affected area and elevate.  Tylenol  for pain.  Weightbearing as tolerated, boot and walker may help.  Follow-up with your primary care doctor and Dr. Margrette orthopedics as needed

## 2023-03-25 ENCOUNTER — Other Ambulatory Visit (HOSPITAL_COMMUNITY): Payer: Self-pay | Admitting: Cardiology

## 2023-03-25 ENCOUNTER — Ambulatory Visit (INDEPENDENT_AMBULATORY_CARE_PROVIDER_SITE_OTHER): Payer: Medicaid Other | Admitting: Gastroenterology

## 2023-03-27 ENCOUNTER — Other Ambulatory Visit: Payer: Self-pay | Admitting: *Deleted

## 2023-03-27 DIAGNOSIS — N1831 Chronic kidney disease, stage 3a: Secondary | ICD-10-CM

## 2023-03-27 DIAGNOSIS — E1122 Type 2 diabetes mellitus with diabetic chronic kidney disease: Secondary | ICD-10-CM

## 2023-03-27 DIAGNOSIS — Z7984 Long term (current) use of oral hypoglycemic drugs: Secondary | ICD-10-CM

## 2023-03-27 MED ORDER — ACCU-CHEK GUIDE TEST VI STRP: ORAL_STRIP | 2 refills | Status: DC

## 2023-03-27 MED ORDER — ACCU-CHEK FASTCLIX LANCETS MISC: 6 refills | Status: DC

## 2023-04-01 ENCOUNTER — Other Ambulatory Visit (HOSPITAL_COMMUNITY): Payer: Self-pay

## 2023-04-04 ENCOUNTER — Telehealth (HOSPITAL_COMMUNITY): Payer: Self-pay | Admitting: Pharmacy Technician

## 2023-04-04 ENCOUNTER — Other Ambulatory Visit (HOSPITAL_COMMUNITY): Payer: Self-pay

## 2023-04-04 NOTE — Telephone Encounter (Signed)
 Advanced Heart Failure Patient Advocate Encounter  Received N/A result for Entresto  PA. The member recently filled this medication and will be able to return for their next refill according to their plan limits.  No further action needed at this time.  Almarie JULIANNA Pa, CPhT

## 2023-04-04 NOTE — Telephone Encounter (Signed)
 Patient Advocate Encounter   Received notification from [facility] that prior authorization for Entresto  is required.   PA submitted on CoverMyMeds Key [id number] Status is pending   Will continue to follow.

## 2023-04-15 ENCOUNTER — Other Ambulatory Visit (HOSPITAL_COMMUNITY): Payer: Self-pay | Admitting: Cardiology

## 2023-05-02 ENCOUNTER — Encounter (INDEPENDENT_AMBULATORY_CARE_PROVIDER_SITE_OTHER): Payer: Self-pay | Admitting: Gastroenterology

## 2023-05-02 ENCOUNTER — Ambulatory Visit (INDEPENDENT_AMBULATORY_CARE_PROVIDER_SITE_OTHER): Payer: Medicaid Other | Admitting: Gastroenterology

## 2023-05-02 VITALS — BP 111/63 | HR 78 | Temp 98.1°F | Ht 66.5 in | Wt 190.4 lb

## 2023-05-02 DIAGNOSIS — K219 Gastro-esophageal reflux disease without esophagitis: Secondary | ICD-10-CM

## 2023-05-02 DIAGNOSIS — K5900 Constipation, unspecified: Secondary | ICD-10-CM

## 2023-05-02 DIAGNOSIS — K5903 Drug induced constipation: Secondary | ICD-10-CM

## 2023-05-02 NOTE — Patient Instructions (Signed)
-  continue with good water intake -diet high in fruits, veggies and whole grains -continue with clearlax as needed  -continue with protonix 40mg  daily -good reflux precautions to include being mindful of greasy, spicy, tomato/citrus based foods, caffeine, chocolate and alcohol, avoid eating late and stay upright 2-3 hours after eating prior to laying down   Follow up 1 year   It was a pleasure to see you today. I want to create trusting relationships with patients and provide genuine, compassionate, and quality care. I truly value your feedback! please be on the lookout for a survey regarding your visit with me today. I appreciate your input about our visit and your time in completing this!    Rosio Weiss L. Jeanmarie Hubert, MSN, APRN, AGNP-C Adult-Gerontology Nurse Practitioner [facility] Gastroenterology at [facility]

## 2023-05-02 NOTE — Progress Notes (Addendum)
 Referring Provider: Catalina Lunger, DO Primary Care Physician:  Catalina Lunger, DO Primary GI Physician: Dr. Levon Hedger   Chief Complaint  Patient presents with   Follow-up    Patient here today for a follow up on her Constipation, which she says has been doing better. If she does have issues she will take miralax daily and that usually fixes the issues.   HPI:   [name] is a 63 y.o. female with past medical history of anal fissure, anxiety, CHF, depression, HTN, GERD, HLD, nonischemic cardiomyopathy, DM Type 2.   Patient presenting today for follow up of: Constipation  GERD   Last seen October 2024, at that time patient having more constipation since being started on calcium supplementation.  Doing Benefiber transient plenty of water plenty fruit in her diet.  Not tried stool softener, laxative and having to use suppositories often have a BM.  Having a BM daily but notes stools are difficult to pass.  Recommend to stop Benefiber, continue to monitor intake and start MiraLAX titration  Present: Patient states constipation has improved significantly. She is having a BM usually daily. Taking clearlax PRN maybe once per week. No changes in appetite or weight loss. She is drinking water daily but having to do sugar free drink packets with it. She is eating fruit with her breakfast now. She is trying to improve her diet and make changes to help with her constipation. She also is coming up on 2 years since she quit smoking.   GERD is well controlled on protonix 40mg  daily. Denies breakthrough. She has cut out trigger foods such as canteloupe as she recognized this made her symptoms flare up.   Last Colonoscopy:01/2022- Seven 3 to 8 mm polyps in the transverse colon                            and in the ascending colon, removed with a cold                            snare. Resected and retrieved.                           - Five 3 to 10 mm polyps in the sigmoid colon and                             in the descending colon, removed with a cold snare.                            Resected and retrieved.                           - Diverticulosis in the sigmoid colon and in the                            descending colon.                        - Non-bleeding internal hemorrhoids. 12 colonic tubular adenomas, repeat colonoscopy in 3 years     Last Endoscopy:never    Filed Weights   05/02/23 1312  Weight: 190 lb 6.4 oz (86.4 kg)  Past Medical History:  Diagnosis Date   Anal fissure    Anxiety    Chronic systolic (congestive) heart failure (HCC)    a. 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31   Mixed hyperlipidemia    Nonischemic cardiomyopathy (HCC)    Type 2 diabetes mellitus (HCC)     Past Surgical History:  Procedure Laterality Date   BACK SURGERY  2005   lumbar disckectomy   COLONOSCOPY  10/01/2011   Procedure: COLONOSCOPY;  Surgeon: Malissa Hippo, MD;  [facility];  Service: Endoscopy;  Laterality: N/A;  730   COLONOSCOPY N/A 10/04/2016   Procedure: COLONOSCOPY;  Surgeon: Malissa Hippo, MD;  Location: [facility];  Service: Endoscopy;  Laterality: N/A;  10:30   COLONOSCOPY WITH PROPOFOL N/A 02/13/2022   Procedure: COLONOSCOPY WITH PROPOFOL;  Surgeon: Dolores Frame, MD;  Location: [facility];  Service: Gastroenterology;  Laterality: N/A;  945 am ASA 3   DILATION AND CURETTAGE OF UTERUS     FOOT BONE EXCISION     right   POLYPECTOMY  10/04/2016   Procedure: POLYPECTOMY;  Surgeon: Malissa Hippo, MD;  Location: [facility];  Service: Endoscopy;;  colon   POLYPECTOMY  02/13/2022   Procedure: POLYPECTOMY;  Surgeon: Dolores Frame, MD;  [facility]: [facility];  Service: Gastroenterology;;   RIGHT/LEFT HEART CATH AND CORONARY ANGIOGRAPHY N/A 03/23/2019    Procedure: RIGHT/LEFT HEART CATH AND CORONARY ANGIOGRAPHY;  Surgeon: Laurey Morale, MD;  Location: [facility] INVASIVE CV LAB;  Service: Cardiovascular;  Laterality: N/A;    Current Outpatient Medications  Medication Sig Dispense Refill   Accu-Chek FastClix Lancets MISC PATIENT IS  TO USE  CHECK BLOOD SUGARS TWICE DAILY as directed by the provider. 100 each 6   aspirin EC 81 MG tablet Take 81 mg by mouth daily. Swallow whole.     atorvastatin (LIPITOR) 80 MG tablet TAKE ONE TABLET (80MG  TOTAL) BY MOUTH DAILY 90 tablet 3   Blood Glucose Monitoring Suppl (ACCU-CHEK GUIDE ME) w/Device KIT Use to monitor blood glucose twice daily, before breakfast and before bed. 1 kit 1   Blood Pressure Monitoring (BLOOD PRESSURE CUFF) MISC 1 Device by Does not apply route as directed. To monitor blood pressure as needed daily dx: I50.9 1 each 0   buPROPion (WELLBUTRIN SR) 150 MG 12 hr tablet TAKE ONE TABLET BY MOUTH FOR 3 DAYS, THEN ONE TABLET TWICE DAILY THEREAFTER 180 tablet 3   busPIRone (BUSPAR) 7.5 MG tablet Take 7.5 mg by mouth 2 (two) times daily.     carvedilol (COREG) 6.25 MG tablet TAKE ONE TABLET BY MOUTH TWICE A DAY WITH A MEAL 180 tablet 1   ENTRESTO 24-26 MG TAKE ONE TABLET BY MOUTH TWICE A DAY 60 tablet 11   FARXIGA 10 MG TABS tablet TAKE ONE TABLET (10MG  TOTAL) BY MOUTH DAILY 30 tablet 11   fenofibrate (TRICOR) 145 MG tablet TAKE ONE TABLET (145 MG TOTAL) BY MOUTH DAILY. 30 tablet 6   FLUoxetine (PROZAC) 40 MG capsule Take 40 mg by mouth daily.     fluticasone (FLONASE) 50 MCG/ACT nasal spray Place 1 spray into both nostrils daily.     glipiZIDE (GLUCOTROL) 5 MG tablet TAKE ONE TABLET (5MG  TOTAL) BY MOUTH DAILY BEFORE BREAKFAST  90 tablet 3   glucose blood (ACCU-CHEK GUIDE TEST) test strip Patient is to check her blood sugar twice a day as directed by provider. 100 each 2   levocetirizine (XYZAL) 5 MG tablet Take 5 mg by mouth every evening.     loperamide (IMODIUM) 2 MG capsule TAKE 2 CAPSULES BY MOUTH  INITIALLY, THEN TAKE 1 CAPSULE AFTER EACH LOOSE STOOL. DO NOT EXCEED 8 CAPSULES IN 24 HOURS. 60 capsule 2   OVER THE COUNTER MEDICATION Calcium citrate 600mg  - takes 2 daily     pantoprazole (PROTONIX) 40 MG tablet TAKE ONE TABLET (40MG  TOTAL) BY MOUTH DAILY 90 tablet 3   sodium chloride (OCEAN) 0.65 % SOLN nasal spray Place 1 spray into both nostrils 2 times daily at 12 noon and 4 pm.     spironolactone (ALDACTONE) 25 MG tablet Take 0.5 tablets (12.5 mg total) by mouth at bedtime. TAKE ONE TABLET (25MG  TOTAL) BY MOUTH DAILY 45 tablet 3   zolpidem (AMBIEN) 10 MG tablet Take 10 mg by mouth at bedtime as needed for sleep.     No current facility-administered medications for this visit.    Allergies as of 05/02/2023 - Review Complete 05/02/2023  Allergen Reaction Noted   Metformin  09/26/2020    Social History   Socioeconomic History   Marital status: Divorced    Spouse name: Not on file   Number of children: Not on file   Years of education: Not on file   Highest education level: Not on file  Occupational History   Not on file  Tobacco Use   Smoking status: Former    Current packs/day: 0.00    Average packs/day: 0.3 packs/day for 37.0 years (9.3 ttl pk-yrs)    Types: Cigarettes    Start date: 11/19/1984    Quit date: 11/19/2021    Years since quitting: 1.4    Passive exposure: Never   Smokeless tobacco: Never   Tobacco comments:    1-2 cigarettes daily  Vaping Use   Vaping status: Never Used  Substance and Sexual Activity   Alcohol use: No   Drug use: No   Sexual activity: Yes    Birth control/protection: None  Other Topics Concern   Not on file  Social History Narrative   Not on file   Social Drivers of Health   Financial Resource Strain: Low Risk  (05/31/2021)   Received from [facility]   Overall Financial Resource Strain (CARDIA)  Food Insecurity: No Food Insecurity (04/02/2023)   Received from [facility]   Hunger Vital Sign    Worried About Running Out  of Food in the Last Year: Never true    Ran Out of Food in the Last Year: Never true  Transportation Needs: No Transportation Needs (04/02/2023)   Received from [facility] - Transportation    Lack of Transportation (Medical): No    Lack of Transportation (Non-Medical): No  Physical Activity: Insufficiently Active (09/28/2020)   Received from [facility]   Exercise Vital Sign    Days of Exercise per Week: 5 days    Minutes of Exercise per Session: 10 min  Stress: Stress Concern Present (04/02/2023)   Received from [facility] - Occupational Stress Questionnaire    Feeling of Stress : To some extent  Social Connections: Moderately Isolated (04/02/2023)   Received from [facility]   Social Connection and Isolation  Panel [NHANES]    Frequency of Communication with Friends and Family: Once a week    Frequency of Social Gatherings with Friends and Family: More than three times a week    Attends Religious Services: 1 to 4 times per year    Active Member of [employer] or Organizations: No    Attends Engineer, structural: Never    Marital Status: Divorced   Review of systems General: negative for malaise, night sweats, fever, chills, weight loss Neck: Negative for lumps, goiter, pain and significant neck swelling Resp: Negative for cough, wheezing, dyspnea at rest CV: Negative for chest pain, leg swelling, palpitations, orthopnea GI: denies melena, hematochezia, nausea, vomiting, diarrhea, constipation, dysphagia, odyonophagia, early satiety or unintentional weight loss.  The remainder of the review of systems is noncontributory.  Physical Exam: BP 111/63 (BP Location: Left Arm, Patient Position: Sitting, Cuff Size: Normal)   Pulse 78   Temp 98.1 F (36.7 C) (Temporal)   Ht 5' 6.5" (1.689 m)   Wt 190 lb 6.4 oz (86.4 kg)   LMP 09/25/2011   BMI 30.27 kg/m  General:   Alert and oriented. No distress  noted. Pleasant and cooperative.  Head:  Normocephalic and atraumatic. Eyes:  Conjuctiva clear without scleral icterus. Mouth:  Oral mucosa pink and moist. Good dentition. No lesions. Heart: Normal rate and rhythm, s1 and s2 heart sounds present.  Lungs: Clear lung sounds in all lobes. Respirations equal and unlabored. Abdomen:  +BS, soft, non-tender and non-distended. No rebound or guarding. No HSM or masses noted.. Neurologic:  Alert and  oriented x4 Psych:  Alert and cooperative. Normal mood and affect.  Invalid input(s): "6 MONTHS"   ASSESSMENT: [name] is a 63 y.o. female presenting today for follow up of constipation and GERD  Constipation:doing much better, suspect that constipation was brought on by supplemental calcium. She has increased her water and fruit intake, doing clear lax PRN. Feels constipation is well managed at this time.   GERD: well managed on protonix 40mg  daily. Denies breakthrough symptoms, dysphagia or odynophagia. Recommend continuing with current PPI regimen and good reflux precautions.    PLAN:  -continue with good water intake -diet high in fruits, veggies and whole grains -continue with clearlax PRN  -continue with protonix 40mg  daily -good reflux precautions  All questions were answered, patient verbalized understanding and is in agreement with plan as outlined above.   Follow Up: 1 year   Meloney Feld L. Jeanmarie Hubert, MSN, APRN, AGNP-C Adult-Gerontology Nurse Practitioner [facility]  I have reviewed the note and agree with the APP's assessment as described in this progress note  Katrinka Blazing, MD Gastroenterology and Hepatology [facility] Gastroenterology

## 2023-06-10 ENCOUNTER — Other Ambulatory Visit: Payer: Self-pay | Admitting: *Deleted

## 2023-06-10 DIAGNOSIS — Z7984 Long term (current) use of oral hypoglycemic drugs: Secondary | ICD-10-CM

## 2023-06-10 DIAGNOSIS — N1831 Chronic kidney disease, stage 3a: Secondary | ICD-10-CM

## 2023-06-10 DIAGNOSIS — N1832 Chronic kidney disease, stage 3b: Secondary | ICD-10-CM

## 2023-06-10 MED ORDER — ACCU-CHEK GUIDE ME W/DEVICE KIT: PACK | 1 refills | Status: AC

## 2023-06-10 MED ORDER — ACCU-CHEK GUIDE ME W/DEVICE KIT: PACK | 0 refills | Status: DC

## 2023-06-17 ENCOUNTER — Other Ambulatory Visit (HOSPITAL_COMMUNITY): Payer: Self-pay | Admitting: Cardiology

## 2023-07-04 ENCOUNTER — Other Ambulatory Visit (HOSPITAL_COMMUNITY): Payer: Self-pay | Admitting: Nurse Practitioner

## 2023-07-04 DIAGNOSIS — Z1231 Encounter for screening mammogram for malignant neoplasm of breast: Secondary | ICD-10-CM

## 2023-07-08 ENCOUNTER — Other Ambulatory Visit (HOSPITAL_COMMUNITY): Payer: Self-pay | Admitting: Cardiology

## 2023-07-09 ENCOUNTER — Other Ambulatory Visit (HOSPITAL_COMMUNITY): Payer: Self-pay | Admitting: Cardiology

## 2023-07-29 ENCOUNTER — Other Ambulatory Visit (HOSPITAL_COMMUNITY): Payer: Self-pay | Admitting: Cardiology

## 2023-08-14 ENCOUNTER — Other Ambulatory Visit: Payer: Self-pay | Admitting: Nurse Practitioner

## 2023-08-14 DIAGNOSIS — E1122 Type 2 diabetes mellitus with diabetic chronic kidney disease: Secondary | ICD-10-CM

## 2023-08-14 DIAGNOSIS — Z7984 Long term (current) use of oral hypoglycemic drugs: Secondary | ICD-10-CM

## 2023-08-15 ENCOUNTER — Telehealth (HOSPITAL_COMMUNITY): Payer: Self-pay

## 2023-08-15 NOTE — Telephone Encounter (Signed)
 Called to confirm/remind patient of their appointment at the [facility] on 08/16/23.   Appointment:   [] Confirmed  [x] Left mess   [] No answer/No voice mail  [] VM Full/unable to leave message  [] Phone not in service And to bring in all medications and/or complete list.  Confirmed patient has transportation. Gave directions, instructed to utilize valet parking.

## 2023-08-16 ENCOUNTER — Encounter (HOSPITAL_COMMUNITY): Payer: Self-pay

## 2023-08-16 ENCOUNTER — Ambulatory Visit (HOSPITAL_COMMUNITY)
Admission: RE | Admit: 2023-08-16 | Discharge: 2023-08-16 | Disposition: A | Discharge status: Home / Self Care | Payer: Medicaid Other | Source: Ambulatory Visit | Attending: Cardiology | Admitting: Cardiology

## 2023-08-16 VITALS — BP 104/62 | HR 70 | Ht 67.0 in | Wt 189.2 lb

## 2023-08-16 DIAGNOSIS — E782 Mixed hyperlipidemia: Secondary | ICD-10-CM | POA: Diagnosis not present

## 2023-08-16 DIAGNOSIS — Z8249 Family history of ischemic heart disease and other diseases of the circulatory system: Secondary | ICD-10-CM | POA: Insufficient documentation

## 2023-08-16 DIAGNOSIS — I739 Peripheral vascular disease, unspecified: Secondary | ICD-10-CM | POA: Insufficient documentation

## 2023-08-16 DIAGNOSIS — Z87891 Personal history of nicotine dependence: Secondary | ICD-10-CM | POA: Diagnosis not present

## 2023-08-16 DIAGNOSIS — E1122 Type 2 diabetes mellitus with diabetic chronic kidney disease: Secondary | ICD-10-CM | POA: Diagnosis not present

## 2023-08-16 DIAGNOSIS — I130 Hypertensive heart and chronic kidney disease with heart failure and stage 1 through stage 4 chronic kidney disease, or unspecified chronic kidney disease: Secondary | ICD-10-CM | POA: Diagnosis not present

## 2023-08-16 DIAGNOSIS — Z79899 Other long term (current) drug therapy: Secondary | ICD-10-CM | POA: Insufficient documentation

## 2023-08-16 DIAGNOSIS — I5022 Chronic systolic (congestive) heart failure: Secondary | ICD-10-CM | POA: Diagnosis present

## 2023-08-16 DIAGNOSIS — N183 Chronic kidney disease, stage 3 unspecified: Secondary | ICD-10-CM | POA: Diagnosis not present

## 2023-08-16 DIAGNOSIS — Z5941 Food insecurity: Secondary | ICD-10-CM | POA: Diagnosis not present

## 2023-08-16 DIAGNOSIS — E1151 Type 2 diabetes mellitus with diabetic peripheral angiopathy without gangrene: Secondary | ICD-10-CM | POA: Diagnosis not present

## 2023-08-16 DIAGNOSIS — Z7984 Long term (current) use of oral hypoglycemic drugs: Secondary | ICD-10-CM | POA: Insufficient documentation

## 2023-08-16 DIAGNOSIS — I5031 Acute diastolic (congestive) heart failure: Secondary | ICD-10-CM

## 2023-08-16 DIAGNOSIS — Z5986 Financial insecurity: Secondary | ICD-10-CM | POA: Insufficient documentation

## 2023-08-16 DIAGNOSIS — J449 Chronic obstructive pulmonary disease, unspecified: Secondary | ICD-10-CM | POA: Insufficient documentation

## 2023-08-16 LAB — COMPREHENSIVE METABOLIC PANEL WITH GFR
ALT: 25 U/L (ref 0–44)
AST: 22 U/L (ref 15–41)
Albumin: 3.7 g/dL (ref 3.5–5.0)
Alkaline Phosphatase: 42 U/L (ref 38–126)
Anion gap: 8 (ref 5–15)
BUN: 41 mg/dL — ABNORMAL HIGH (ref 8–23)
CO2: 24 mmol/L (ref 22–32)
Calcium: 9.5 mg/dL (ref 8.9–10.3)
Chloride: 106 mmol/L (ref 98–111)
Creatinine, Ser: 1.87 mg/dL — ABNORMAL HIGH (ref 0.44–1.00)
GFR, Estimated: 30 mL/min — ABNORMAL LOW (ref >60)
Glucose, Bld: 158 mg/dL — ABNORMAL HIGH (ref 70–99)
Potassium: 4.2 mmol/L (ref 3.5–5.1)
Sodium: 138 mmol/L (ref 135–145)
Total Bilirubin: 0.6 mg/dL (ref 0.0–1.2)
Total Protein: 6.4 g/dL — ABNORMAL LOW (ref 6.5–8.1)

## 2023-08-16 LAB — LIPID PANEL
Cholesterol: 126 mg/dL (ref 0–200)
HDL: 47 mg/dL (ref >40)
LDL Cholesterol: 49 mg/dL (ref 0–99)
Total CHOL/HDL Ratio: 2.7 ratio
Triglycerides: 151 mg/dL — ABNORMAL HIGH (ref <150)
VLDL: 30 mg/dL (ref 0–40)

## 2023-08-16 NOTE — Patient Instructions (Addendum)
 Great to see you today!!!  Medication Changes:  None, continue current medications  Labs:  Labs done today, your results will be available in MyChart, we will contact you for abnormal readings.  Special Instructions // Education:  Do the following things EVERYDAY: Weigh yourself in the morning before breakfast. Write it down and keep it in a log. Take your medicines as prescribed Eat low salt foods--Limit salt (sodium) to 2000 mg per day.  Stay as active as you can everyday Limit all fluids for the day to less than 2 liters   Follow-Up in: 6 months with Dr Mitzie Anda (December), **PLEASE CALL OUR OFFICE IN OCTOBER TO SCHEDULE THIS APPOINTMENT   At the [facility], you and your health needs are our priority. We have a designated team specialized in the treatment of Heart Failure. This Care Team includes your primary Heart Failure Specialized Cardiologist (physician), Advanced Practice Providers (APPs- Physician Assistants and Nurse Practitioners), and Pharmacist who all work together to provide you with the care you need, when you need it.   You may see any of the following providers on your designated Care Team at your next follow up:  Dr. Jules Oar Dr. Peder Bourdon Dr. Alwin Baars Dr. Judyth Nunnery Nieves Bars, NP Ruddy Corral, [name] [facility] [place], [name] Dennise Fitz, NP [place] Lee, NP Luster Salters, [name]   Please be sure to bring in all your medications bottles to every appointment.   Need to Contact Us :  If you have any questions or concerns before your next appointment please send us  a message through [place] or call our office at [phone].    TO LEAVE A MESSAGE FOR THE NURSE SELECT OPTION 2, PLEASE LEAVE A MESSAGE INCLUDING: YOUR NAME DATE OF BIRTH CALL BACK NUMBER REASON FOR CALL**this is important as we prioritize the call backs  YOU WILL RECEIVE A CALL BACK THE SAME DAY AS LONG AS YOU CALL BEFORE 4:00 PM

## 2023-08-16 NOTE — Progress Notes (Signed)
 [facility] Note   PCP: Avelina Bode, DO Primary Cardiologist: Teddie Favre, MD  HF Cardiology: Dr. Mitzie Anda  Vascular: Dr. Shirley Douglas   Reason for Visit: F/u for HFimEF   HPI: 63 y.o. female w/ history of DM, HTN, hyperlipidemia, and smoking.  She was admitted on 1/22 for about 1 week of shortness of breath. She had been started on azithromycin and prednisone by PCP and was taking inhalers for ?COPD exacerbation without relief.  COVID negative.  Dyspnea primarily exertional, no orthopnea. No chest pain reported.  In ER at [facility], pro-BNP was elevated and CXR suggestive of volume overload. Slightly elevated HS-TnI with no trend, not suggestive of ACS.  She was admitted for management of new diagnosis of CHF and seen by Dr. Londa Rival.  Echo was done, showed EF 20-25% with mild RV dysfunction. She was started on IV Lasix  for diuresed then transitioned to PO.  She was transferred from [facility] to [name] for RHC/LHC to assess filling pressures/CO and also to assess for coronary disease as etiology.    Of note, her father developed CHF in his 57s, nonischemic cardiomyopathy.  He had ICD and later heart transplant.  Grandmother also had CHF.   Cath was done 1/21 and showed no significant coronary disease and optimized filling pressures with preserved cardiac output (see angiographic and hemodynamic data below). Diagnosed w/ NICM. cMRI showed no myocardial LGE, so no definitive evidence for prior MI, infiltrative disease, or cardiomyopathy. RV normal w/ EF 35%. She was started on GDMT w/ ARB, ? blocker and MRA + loop diuretic.   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 Drexel Gentles, thought to be probably be a benign variant.   Echo 5/21 with EF 30-35% (closer to 35%), diffuse hypokinesis, mildly decreased RV systolic function, normal IVC. Echo in 11/21 showed EF 45%, diffuse hypokinesis, mildly decreased RV systolic function.  Echo in 11/22 showed stable  EF 45-50%.   2022 ABI - Noninvasive test from [facility] were reviewed.  This revealed ankle arm index of 0.68 on the right and 1.0 on the left  ABI 3/24 showed moderate PAD in R leg, stable from prior study.  Echo 3/24 with EF 50-55%, RV normal.   She presents today for routine f/u. Doing fairly well. Denies resting dyspnea but endorses some mild exertional dyspnea and wheezing w/ moderate level activities. Worse when she is out in the heat. No orthopnea/PND. No LEE. Denies CP.   Compliant w/ meds. BP controlled, 104/62. No orthostatics symptoms.   Quit smoking 3 years ago.   Still w/ mild occasional claudication symptoms but stable. Denies resting leg pain.   Labs (10/23): LDL 70 Labs (12/23): K 3.3, creatinine 1.25 Labs (2/24): K 3.4, creatinine 1.46, BNP 51 Labs (3/24): K 4, creatinine 1.59 Labs (9/24): LDL 49  ECG (personally reviewed): not ordered today    PMH: 1. Hyperlipidemia 2. Type 2 diabetes.  3. Chronic systolic CHF:  - Echo (1/21).  EF 20-25%, mildly decreased RV function.  - RHC/LHC (1/21): Nonobstructive CAD; mean RA 9, PA 28/5, mean PCWP 9, CI 2.37 - Cardiac MRI (1/21): LV EF 31%, RVEF 28%, No myocardial LGE.  - Echo (5/21): EF 30-35% (closer to 35%), diffuse hypokinesis, mildly decreased RV systolic function, normal IVC. 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 Drexel Gentles, likely a benign variant.  - Echo (11/21): EF 45%, diffuse hypokinesis, mildly decreased RV systolic function.  -  Echo (11/22): EF 45-50%  - Echo (3/24): EF 50-55%, RV normal.  4. Smoker: Quit in 2023 with Chantix .  - PFTs (6/24): Minimal obstruction 5. CKD stage 3 6. PAD: ABIs (2022) with 0.68 on right and 1.0 on left.  - ABIs (3/24): 0.77 on right, 1.0 on left  Review of Systems: All systems reviewed and negative except as per HPI.   Current Outpatient Medications  Medication Sig Dispense Refill   Accu-Chek FastClix Lancets MISC PATIENT IS  TO  USE  CHECK BLOOD SUGARS TWICE DAILY as directed by the provider. 100 each 6   ACCU-CHEK GUIDE TEST test strip Patient is to check her blood sugar twice a day as directed by provider. 100 strip 2   aspirin  EC 81 MG tablet Take 81 mg by mouth daily. Swallow whole.     atorvastatin  (LIPITOR) 80 MG tablet TAKE ONE TABLET (80MG  TOTAL) BY MOUTH DAILY 90 tablet 3   Blood Glucose Monitoring Suppl (ACCU-CHEK GUIDE ME) w/Device KIT Use to monitor blood glucose twice daily, before breakfast and before bed. 1 kit 1   Blood Pressure Monitoring (BLOOD PRESSURE CUFF) MISC 1 Device by Does not apply route as directed. To monitor blood pressure as needed daily dx: I50.9 1 each 0   buPROPion  (WELLBUTRIN  SR) 150 MG 12 hr tablet TAKE ONE TABLET BY MOUTH FOR 3 DAYS, THEN ONE TABLET TWICE DAILY THEREAFTER 180 tablet 3   busPIRone (BUSPAR) 7.5 MG tablet Take 7.5 mg by mouth 2 (two) times daily.     carvedilol  (COREG ) 6.25 MG tablet TAKE ONE TABLET BY MOUTH TWICE A DAY WITH A MEAL 180 tablet 1   ENTRESTO  24-26 MG TAKE ONE TABLET BY MOUTH TWICE A DAY 60 tablet 11   FARXIGA  10 MG TABS tablet Take 1 tablet by mouth once daily 30 tablet 1   fenofibrate  (TRICOR ) 145 MG tablet TAKE ONE TABLET (145 MG TOTAL) BY MOUTH DAILY. 90 tablet 3   FLUoxetine  (PROZAC ) 40 MG capsule Take 40 mg by mouth daily.     fluticasone (FLONASE) 50 MCG/ACT nasal spray Place 1 spray into both nostrils daily.     glipiZIDE  (GLUCOTROL ) 5 MG tablet TAKE ONE TABLET (5MG  TOTAL) BY MOUTH DAILY BEFORE BREAKFAST 90 tablet 3   levocetirizine (XYZAL) 5 MG tablet Take 5 mg by mouth every evening.     loperamide  (IMODIUM ) 2 MG capsule TAKE 2 CAPSULES BY MOUTH INITIALLY, THEN TAKE 1 CAPSULE AFTER EACH LOOSE STOOL. DO NOT EXCEED 8 CAPSULES IN 24 HOURS. 60 capsule 2   OVER THE COUNTER MEDICATION Calcium  citrate 600mg  - takes 2 daily     pantoprazole  (PROTONIX ) 40 MG tablet TAKE ONE TABLET (40MG  TOTAL) BY MOUTH DAILY 90 tablet 3   sodium chloride  (OCEAN) 0.65 % SOLN  nasal spray Place 1 spray into both nostrils 2 times daily at 12 noon and 4 pm.     spironolactone  (ALDACTONE ) 25 MG tablet Take 0.5 tablets (12.5 mg total) by mouth at bedtime. TAKE ONE TABLET (25MG  TOTAL) BY MOUTH DAILY 45 tablet 3   zolpidem  (AMBIEN ) 10 MG tablet Take 10 mg by mouth at bedtime as needed for sleep.     No current facility-administered medications for this visit.   Allergies  Allergen Reactions   Metformin     Causes GI issues   Social History   Socioeconomic History   Marital status: Divorced    Spouse name: Not on file   Number of children: Not on file   Years  of education: Not on file   Highest education level: Not on file  Occupational History   Not on file  Tobacco Use   Smoking status: Former    Current packs/day: 0.00    Average packs/day: 0.3 packs/day for 37.0 years (9.3 ttl pk-yrs)    Types: Cigarettes    Start date: 11/19/1984    Quit date: 11/19/2021    Years since quitting: 1.7    Passive exposure: Never   Smokeless tobacco: Never   Tobacco comments:    1-2 cigarettes daily  Vaping Use   Vaping status: Never Used  Substance and Sexual Activity   Alcohol use: No   Drug use: No   Sexual activity: Yes    Birth control/protection: None  Other Topics Concern   Not on file  Social History Narrative   Not on file   Social Drivers of Health   Financial Resource Strain: Medium Risk (07/03/2023)   Received from [facility]   Overall Financial Resource Strain (CARDIA)    Difficulty of Paying Living Expenses: Somewhat hard  Food Insecurity: Food Insecurity Present (07/03/2023)   Received from [facility]   Hunger Vital Sign    Within the past 12 months, you worried that your food would run out before you got the money to buy more.: Sometimes true    Within the past 12 months, the food you bought just didn't last and you didn't have money to get more.: Often true  Transportation Needs: Patient Declined (07/03/2023)   Received from [facility] - Transportation    Lack of Transportation (Medical): Patient declined    Lack of Transportation (Non-Medical): Patient declined  Physical Activity: Insufficiently Active (07/10/2023)   Received from [facility]   Exercise Vital Sign    On average, how many days per week do you engage in moderate to strenuous exercise (like a brisk walk)?: 5 days    On average, how many minutes do you engage in exercise at this level?: 20 min  Stress: Stress Concern Present (04/02/2023)   Received from [facility] of Occupational [facility] - Occupational Stress Questionnaire    Feeling of Stress : To some extent  Social Connections: Moderately Isolated (04/02/2023)   Received from [facility]   Social Connection and Isolation Panel    In a typical week, how many times do you talk on the phone with family, friends, or neighbors?: Once a week    How often do you get together with friends or relatives?: More than three times a week    How often do you attend church or religious services?: 1 to 4 times per year    Do you belong to any clubs or organizations such as church groups, unions, fraternal or athletic groups, or school groups?: No    How often do you attend meetings of the clubs or organizations you belong to?: Never    Are you married, widowed, divorced, separated, never married, or living with a partner?: Divorced  Intimate Partner Violence: Not At Risk (06/18/2022)   Received from [facility]   Humiliation, Afraid, Rape, and Kick questionnaire    Within the last year, have you been afraid of your partner or ex-partner?: No    Within the last year, have you been humiliated or emotionally abused in other ways by your partner or ex-partner?: No    Within the last year, have you been kicked, hit,  slapped, or otherwise physically hurt by your partner or ex-partner?: No    Within the last year, have you been raped or forced to have any kind of sexual activity by  your partner or ex-partner?: No    Family History  Problem Relation Age of Onset   Arthritis Mother    Hernia Mother    Constipation Mother    Heart failure Father        Underwent heart transplant   Colon cancer Neg Hx    LMP 09/25/2011   Wt Readings from Last 3 Encounters:  05/02/23 86.4 kg (190 lb 6.4 oz)  03/01/23 84.5 kg (186 lb 6.4 oz)  02/26/23 84.6 kg (186 lb 6.4 oz)   PHYSICAL EXAM: ReDs 27%, normal  General:  Well appearing. No respiratory difficulty HEENT: normal Neck: supple. no JVD. Carotids 2+ bilat; no bruits. No lymphadenopathy or thyromegaly appreciated. Cor: PMI nondisplaced. Regular rate & rhythm. No rubs, gallops or murmurs. Lungs: clear Abdomen: soft, nontender, nondistended. No hepatosplenomegaly. No bruits or masses. Good bowel sounds. Extremities: no cyanosis, clubbing, rash, edema Neuro: alert & oriented x 3, cranial nerves grossly intact. moves all 4 extremities w/o difficulty. Affect pleasant.   ASSESSMENT & PLAN: 1. Chronic Systolic CHF: Echo 1/21 w/ EF 54-09%, mild RV dysfunction.  No prior cardiac history but father has history of nonischemic cardiomyopathy with heart transplant and paternal grandmother also had CHF.  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 Drexel Gentles, probably benign variant.  LHC/RHC 1/21 showed no significant coronary disease and optimized filling pressures with preserved cardiac output. cMRI showed no myocardial LGE, so no definitive evidence for prior MI, infiltrative disease, or cardiomyopathy.  Echo 5/21 showed mild improvement in LV function, EF 30-35% (probably closer to 35%).  Echo in 11/22 with improved EF 45-50%. Echo in 3/24 with EF 50-55%, RV normal. NYHA Class II, confounded by COPD. Euvolemic on exam and by ReDs 27%.  - Continue Coreg  6.25 mg bid.  - Continue Entresto  24-26 bid. BP too soft for titration  - Continue spironolactone  25 mg qhs - Continue dapagliflozin  10 mg daily.   - Check CMP  2. H/o Tobacco Use: She was a 30 year, 1/2 ppd smoker. She has been off cigarettes for 3 yrs now.  - congratulated on efforts  3. HTN: Controlled on current regimen. Continue per above. No changes  4. Type 2 diabetes: followed by Endo  - on SGLT2i, Farxiga   5. Hyperlipidemia: Continue atorvastatin  80  - check LP and HFT 6. PAD: Claudication of the thighs and buttocks has continued to bother her, even though she has quit smoking. No rest pain or pedal ulcerations. ABIs (3/24) showed moderate PAD in R leg, stable from prior study. She will contact [facility] if pain worsens, or occurs with rest.  F/u in 6 mo w/ Dr. Jannette Mend, PA-C 08/16/23

## 2023-08-19 ENCOUNTER — Ambulatory Visit (HOSPITAL_COMMUNITY)
Admission: RE | Admit: 2023-08-19 | Discharge: 2023-08-19 | Disposition: A | Discharge status: Home / Self Care | Source: Ambulatory Visit | Attending: Nurse Practitioner | Admitting: Nurse Practitioner

## 2023-08-19 DIAGNOSIS — Z1231 Encounter for screening mammogram for malignant neoplasm of breast: Secondary | ICD-10-CM | POA: Insufficient documentation

## 2023-08-20 ENCOUNTER — Ambulatory Visit: Payer: Self-pay | Admitting: Cardiology

## 2023-08-26 ENCOUNTER — Ambulatory Visit: Payer: Medicaid Other | Admitting: Nurse Practitioner

## 2023-08-26 ENCOUNTER — Encounter: Payer: Self-pay | Admitting: Nurse Practitioner

## 2023-08-26 VITALS — BP 98/64 | HR 70 | Ht 66.6 in | Wt 189.4 lb

## 2023-08-26 DIAGNOSIS — F17200 Nicotine dependence, unspecified, uncomplicated: Secondary | ICD-10-CM

## 2023-08-26 DIAGNOSIS — I10 Essential (primary) hypertension: Secondary | ICD-10-CM | POA: Diagnosis not present

## 2023-08-26 DIAGNOSIS — N1832 Chronic kidney disease, stage 3b: Secondary | ICD-10-CM

## 2023-08-26 DIAGNOSIS — Z7984 Long term (current) use of oral hypoglycemic drugs: Secondary | ICD-10-CM

## 2023-08-26 DIAGNOSIS — E782 Mixed hyperlipidemia: Secondary | ICD-10-CM | POA: Diagnosis not present

## 2023-08-26 DIAGNOSIS — N1831 Chronic kidney disease, stage 3a: Secondary | ICD-10-CM

## 2023-08-26 DIAGNOSIS — E1122 Type 2 diabetes mellitus with diabetic chronic kidney disease: Secondary | ICD-10-CM | POA: Diagnosis not present

## 2023-08-26 LAB — POCT GLYCOSYLATED HEMOGLOBIN (HGB A1C): Hemoglobin A1C: 6.8 % — AB (ref 4.0–5.6)

## 2023-08-26 MED ORDER — ACCU-CHEK GUIDE TEST VI STRP: ORAL_STRIP | 2 refills | Status: DC

## 2023-08-26 MED ORDER — GLIPIZIDE 5 MG PO TABS: ORAL_TABLET | ORAL | 3 refills | Status: DC

## 2023-08-26 MED ORDER — ACCU-CHEK FASTCLIX LANCETS MISC: 6 refills | Status: AC

## 2023-08-26 MED ORDER — FARXIGA 10 MG PO TABS: 10.0000 mg | ORAL_TABLET | Freq: Every day | ORAL | 1 refills | Status: DC

## 2023-08-26 NOTE — Progress Notes (Signed)
 08/26/2023, 11:21 AM  Endocrinology follow-up note   Subjective:    Patient ID: [name], female    DOB: 1961/01/11.  [name] is being seen in follow-up after she was seen in consultation for management of currently uncontrolled symptomatic diabetes requested by  Tobie Guy, DO.   Past Medical History:  Diagnosis Date   Anal fissure    Anxiety    Chronic systolic (congestive) heart failure (HCC)    a. 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8   Mixed hyperlipidemia    Nonischemic cardiomyopathy (HCC)    Type 2 diabetes mellitus (HCC)     Past Surgical History:  Procedure Laterality Date   BACK SURGERY  2005   lumbar disckectomy   COLONOSCOPY  10/01/2011   Procedure: COLONOSCOPY;  Surgeon: Claudis RAYMOND Rivet, MD;  [facility];  Service: Endoscopy;  Laterality: N/A;  730   COLONOSCOPY N/A 10/04/2016   Procedure: COLONOSCOPY;  Surgeon: Rivet Claudis RAYMOND, MD;  [facility];  Service: Endoscopy;  Laterality: N/A;  10:30   COLONOSCOPY WITH PROPOFOL  N/A 02/13/2022   Procedure: COLONOSCOPY WITH PROPOFOL ;  Surgeon: Eartha Angelia Sieving, MD;  [facility];  Service: Gastroenterology;  Laterality: N/A;  945 am ASA 3   DILATION AND CURETTAGE OF UTERUS     FOOT BONE EXCISION     right   POLYPECTOMY  10/04/2016   Procedure: POLYPECTOMY;  Surgeon: Rivet Claudis RAYMOND, MD;  [facility];  Service: Endoscopy;;  colon   POLYPECTOMY  02/13/2022   Procedure: POLYPECTOMY;  Surgeon: Eartha Angelia Sieving, MD;  [facility];  Service: Gastroenterology;;   RIGHT/LEFT HEART CATH AND CORONARY ANGIOGRAPHY N/A 03/23/2019   Procedure: RIGHT/LEFT HEART CATH AND CORONARY ANGIOGRAPHY;  Surgeon: Rolan Ezra RAMAN, MD;   Location[facility] CV LAB;  Service: Cardiovascular;  Laterality: N/A;    Social History   Socioeconomic History   Marital status: Divorced    Spouse name: Not on file   Number of children: Not on file   Years of education: Not on file   Highest education level: Not on file  Occupational History   Not on file  Tobacco Use   Smoking status: Former    Current packs/day: 0.00    Average packs/day: 0.3 packs/day for 37.0 years (9.3 ttl pk-yrs)    Types: Cigarettes    Start date: 11/19/1984    Quit date: 11/19/2021    Years since quitting: 1.7    Passive exposure: Never   Smokeless tobacco: Never   Tobacco comments:    1-2 cigarettes daily  Vaping Use   Vaping status: Never Used  Substance and Sexual Activity   Alcohol use: No   Drug use: No   Sexual activity: Yes    Birth control/protection: None  Other Topics Concern   Not on file  Social History Narrative   Not on file   Social Drivers of Health   Financial Resource Strain: Medium  Risk (07/03/2023)   Received from [facility]   Overall Financial Resource Strain (CARDIA)    Difficulty of Paying Living Expenses: Somewhat hard  Food Insecurity: Food Insecurity Present (07/03/2023)   Received from [facility]   Hunger Vital Sign    Within the past 12 months, you worried that your food would run out before you got the money to buy more.: Sometimes true    Within the past 12 months, the food you bought just didn't last and you didn't have money to get more.: Often true  Transportation Needs: Patient Declined (07/03/2023)   Received from [facility] - Transportation    Lack of Transportation (Medical): Patient declined    Lack of Transportation (Non-Medical): Patient declined  Physical Activity: Insufficiently Active (07/10/2023)   Received from [facility]   Exercise Vital Sign    On average, how many days per week do you engage in moderate to strenuous exercise (like a brisk walk)?: 5 days    On  average, how many minutes do you engage in exercise at this level?: 20 min  Stress: Stress Concern Present (04/02/2023)   Received from [facility] - Occupational Stress Questionnaire    Feeling of Stress : To some extent  Social Connections: Moderately Isolated (04/02/2023)   Received from [facility]   Social Connection and Isolation Panel    In a typical week, how many times do you talk on the phone with family, friends, or neighbors?: Once a week    How often do you get together with friends or relatives?: More than three times a week    How often do you attend church or religious services?: 1 to 4 times per year    Do you belong to any clubs or organizations such as church groups, unions, fraternal or athletic groups, or school groups?: No    How often do you attend meetings of the clubs or organizations you belong to?: Never    Are you married, widowed, divorced, separated, never married, or living with a partner?: Divorced    Family History  Problem Relation Age of Onset   Arthritis Mother    Hernia Mother    Constipation Mother    Heart failure Father        Underwent heart transplant   Colon cancer Neg Hx     Outpatient Encounter Medications as of 08/26/2023  Medication Sig   aspirin  EC 81 MG tablet Take 81 mg by mouth daily. Swallow whole.   atorvastatin  (LIPITOR) 80 MG tablet TAKE ONE TABLET (80MG  TOTAL) BY MOUTH DAILY   Blood Glucose Monitoring Suppl (ACCU-CHEK GUIDE ME) w/Device KIT Use to monitor blood glucose twice daily, before breakfast and before bed.   Blood Pressure Monitoring (BLOOD PRESSURE CUFF) MISC 1 Device by Does not apply route as directed. To monitor blood pressure as needed daily dx: I50.9   buPROPion  (WELLBUTRIN  SR) 150 MG 12 hr tablet TAKE ONE TABLET BY MOUTH FOR 3 DAYS, THEN ONE TABLET TWICE DAILY THEREAFTER   busPIRone (BUSPAR) 7.5 MG tablet Take 7.5 mg by mouth 2 (two) times daily.   carvedilol  (COREG )  6.25 MG tablet TAKE ONE TABLET BY MOUTH TWICE A DAY WITH A MEAL   ENTRESTO  24-26 MG TAKE ONE TABLET BY MOUTH TWICE A DAY   fenofibrate  (TRICOR ) 145 MG tablet TAKE ONE TABLET (145 MG TOTAL) BY MOUTH DAILY.   FLUoxetine  (PROZAC ) 40  MG capsule Take 40 mg by mouth daily.   fluticasone (FLONASE) 50 MCG/ACT nasal spray Place 1 spray into both nostrils daily.   levocetirizine (XYZAL) 5 MG tablet Take 5 mg by mouth every evening.   loperamide  (IMODIUM ) 2 MG capsule TAKE 2 CAPSULES BY MOUTH INITIALLY, THEN TAKE 1 CAPSULE AFTER EACH LOOSE STOOL. DO NOT EXCEED 8 CAPSULES IN 24 HOURS.   pantoprazole  (PROTONIX ) 40 MG tablet TAKE ONE TABLET (40MG  TOTAL) BY MOUTH DAILY   RESTASIS 0.05 % ophthalmic emulsion Place 1 drop into both eyes 2 (two) times daily.   sodium chloride  (OCEAN) 0.65 % SOLN nasal spray Place 1 spray into both nostrils 2 times daily at 12 noon and 4 pm.   spironolactone  (ALDACTONE ) 25 MG tablet Take 0.5 tablets (12.5 mg total) by mouth at bedtime. TAKE ONE TABLET (25MG  TOTAL) BY MOUTH DAILY   zolpidem  (AMBIEN ) 10 MG tablet Take 10 mg by mouth at bedtime as needed for sleep.   [DISCONTINUED] Accu-Chek FastClix Lancets MISC PATIENT IS  TO USE  CHECK BLOOD SUGARS TWICE DAILY as directed by the provider.   [DISCONTINUED] ACCU-CHEK GUIDE TEST test strip Patient is to check her blood sugar twice a day as directed by provider.   [DISCONTINUED] FARXIGA  10 MG TABS tablet Take 1 tablet by mouth once daily   [DISCONTINUED] glipiZIDE  (GLUCOTROL ) 5 MG tablet TAKE ONE TABLET (5MG  TOTAL) BY MOUTH DAILY BEFORE BREAKFAST   Accu-Chek FastClix Lancets MISC PATIENT IS  TO USE  CHECK BLOOD SUGARS TWICE DAILY as directed by the provider.   FARXIGA  10 MG TABS tablet Take 1 tablet (10 mg total) by mouth daily.   glipiZIDE  (GLUCOTROL ) 5 MG tablet TAKE ONE TABLET (5MG  TOTAL) BY MOUTH DAILY BEFORE BREAKFAST   glucose blood (ACCU-CHEK GUIDE TEST) test strip Patient is to check her blood sugar twice a day as directed by  provider.   No facility-administered encounter medications on file as of 08/26/2023.    ALLERGIES: Allergies  Allergen Reactions   Metformin     Causes GI issues    VACCINATION STATUS:  There is no immunization history on file for this patient.  Diabetes She presents for her follow-up diabetic visit. She has type 2 diabetes mellitus. Onset time: She was diagnosed at approximate age of 31 years. Her disease course has been stable. There are no hypoglycemic associated symptoms. Pertinent negatives for hypoglycemia include no confusion, headaches, pallor or seizures. Associated symptoms include fatigue. Pertinent negatives for diabetes include no chest pain, no polydipsia, no polyphagia and no polyuria. There are no hypoglycemic complications. Symptoms are stable. Diabetic complications include heart disease and nephropathy. Risk factors for coronary artery disease include dyslipidemia, diabetes mellitus, obesity, sedentary lifestyle, post-menopausal and tobacco exposure. Current diabetic treatment includes oral agent (dual therapy). She is compliant with treatment most of the time. Her weight is fluctuating minimally. She is following a generally unhealthy diet. When asked about meal planning, she reported none. She has not had a previous visit with a dietitian. She rarely participates in exercise. Her home blood glucose trend is fluctuating minimally. Her overall blood glucose range is 140-180 mg/dl. (She presents today with her meter and logs showing stable, at goal glycemic profile overall.  Her POCT A1c today is 6.8%, unchanged from last visit.  She denies any hypoglycemia.  Analysis of her meter shows 7-day average of 156, 14-day average of 154, 30-day average of 154, 90-day average of 153.  ) An ACE inhibitor/angiotensin II receptor blocker is being taken.  Eye exam is current.  Hyperlipidemia This is a chronic problem. The current episode started more than 1 year ago. The problem is  uncontrolled. Recent lipid tests were reviewed and are high. Exacerbating diseases include chronic renal disease, diabetes and obesity. Factors aggravating her hyperlipidemia include fatty foods, beta blockers and smoking. Pertinent negatives include no chest pain, myalgias or shortness of breath. Current antihyperlipidemic treatment includes statins and fibric acid derivatives. The current treatment provides mild improvement of lipids. Compliance problems include adherence to diet, adherence to exercise and psychosocial issues.  Risk factors for coronary artery disease include dyslipidemia, diabetes mellitus, obesity, a sedentary lifestyle and post-menopausal.  Hypertension This is a chronic problem. The current episode started more than 1 year ago. The problem has been resolved since onset. The problem is controlled. Pertinent negatives include no chest pain, headaches, palpitations or shortness of breath. There are no associated agents to hypertension. Risk factors for coronary artery disease include diabetes mellitus, dyslipidemia, obesity, smoking/tobacco exposure, sedentary lifestyle and post-menopausal state. Past treatments include ACE inhibitors, beta blockers, angiotensin blockers and diuretics. The current treatment provides significant improvement. Compliance problems include diet and exercise.  Hypertensive end-organ damage includes kidney disease and heart failure. Identifiable causes of hypertension include chronic renal disease.    Review of systems  Constitutional: + Minimally fluctuating body weight,  current Body mass index is 30.02 kg/m. , no fatigue, no subjective hyperthermia, no subjective hypothermia Eyes: no blurry vision, no xerophthalmia ENT: no sore throat, no nodules palpated in throat, no dysphagia/odynophagia, no hoarseness Cardiovascular: no chest pain, no shortness of breath, no palpitations, no leg swelling Respiratory: no cough, no shortness of breath Gastrointestinal:  no nausea/vomiting/diarrhea Musculoskeletal: no muscle/joint aches Skin: no rashes, no hyperemia Neurological: no tremors, no numbness, no tingling, no dizziness Psychiatric: no depression, no anxiety   Objective:    BP 98/64 (BP Location: Left Arm, Patient Position: Sitting, Cuff Size: Large)   Pulse 70   Ht 5' 6.6 (1.692 m)   Wt 189 lb 6.4 oz (85.9 kg)   LMP 09/25/2011   BMI 30.02 kg/m   Wt Readings from Last 3 Encounters:  08/26/23 189 lb 6.4 oz (85.9 kg)  08/16/23 189 lb 3.2 oz (85.8 kg)  05/02/23 190 lb 6.4 oz (86.4 kg)    BP Readings from Last 3 Encounters:  08/26/23 98/64  08/16/23 104/62  05/02/23 111/63     Physical Exam- Limited  Constitutional:  Body mass index is 30.02 kg/m. , not in acute distress, normal state of mind Eyes:  EOMI, no exophthalmos Musculoskeletal: no gross deformities, strength intact in all four extremities, no gross restriction of joint movements Skin:  no rashes, no hyperemia Neurological: no tremor with outstretched hands  Diabetic Foot Exam - Simple   No data filed     CMP ( most recent) CMP     Component Value Date/Time   NA 138 08/16/2023 1051   NA 139 07/15/2019 0825   K 4.2 08/16/2023 1051   CL 106 08/16/2023 1051   CO2 24 08/16/2023 1051   GLUCOSE 158 (H) 08/16/2023 1051   BUN 41 (H) 08/16/2023 1051   BUN 27 (H) 07/15/2019 0825   CREATININE 1.87 (H) 08/16/2023 1051   CREATININE 1.12 (H) 06/05/2019 0710   CALCIUM  9.5 08/16/2023 1051   PROT 6.4 (L) 08/16/2023 1051   ALBUMIN 3.7 08/16/2023 1051   AST 22 08/16/2023 1051   ALT 25 08/16/2023 1051   ALKPHOS 42 08/16/2023 1051   BILITOT 0.6 08/16/2023  1051   GFRNONAA 30 (L) 08/16/2023 1051   GFRAA 54 (L) 10/02/2019 1228     Diabetic Labs (most recent): Lab Results  Component Value Date   HGBA1C 6.8 (A) 08/26/2023   HGBA1C 6.8 (A) 02/26/2023   HGBA1C 6.6 (A) 08/24/2022   MICROALBUR 10 08/04/2020    Assessment & Plan:   1) Diabetes mellitus with stage 3  chronic kidney disease (HCC)  - [name] has currently uncontrolled symptomatic type 2 DM since  63 years of age.  She presents today with her meter and logs showing stable, at goal glycemic profile overall.  Her POCT A1c today is 6.8%, unchanged from last visit.  She denies any hypoglycemia.  Analysis of her meter shows 7-day average of 156, 14-day average of 154, 30-day average of 154, 90-day average of 153.    - I had a long discussion with her about the progressive nature of diabetes and the pathology behind its complications. -her diabetes is complicated by CHF, CKD stage 3b , smoking and she remains at a high risk for more acute and chronic complications which include CAD, CVA, CKD, retinopathy, and neuropathy. These are all discussed in detail with her.  - Nutritional counseling repeated at each appointment due to patients tendency to fall back in to old habits.  - The patient admits there is a room for improvement in their diet and drink choices. -  Suggestion is made for the patient to avoid simple carbohydrates from their diet including Cakes, Sweet Desserts / Pastries, Ice Cream, Soda (diet and regular), Sweet Tea, Candies, Chips, Cookies, Sweet Pastries, Store Bought Juices, Alcohol in Excess of 1-2 drinks a day, Artificial Sweeteners, Coffee Creamer, and Sugar-free Products. This will help patient to have stable blood glucose profile and potentially avoid unintended weight gain.   - I encouraged the patient to switch to unprocessed or minimally processed complex starch and increased protein intake (animal or plant source), fruits, and vegetables.   - Patient is advised to stick to a routine mealtimes to eat 3 meals a day and avoid unnecessary snacks (to snack only to correct hypoglycemia).  - I have approached her with the following individualized plan to manage her diabetes and patient agrees:   - She did not tolerate Metformin.  -She is advised to continue her Farxiga  10 mg  po daily (as prescribed by cardiology) and her Glipizide  5 mg PO daily with breakfast.   -She is encouraged to continue monitoring blood glucose twice daily, before breakfast and before bed, and to call the clinic if she has readings less than 70 or greater than 200 for 3 tests in a row.    - she is not a suitable candidate for incretin therapy either due to her body habitus as well as chronic smoking history, posing risk for pancreatitis.  -Addition of basal insulin  would be the safest option if she loses control on subsequent visits.  - Specific targets for  A1c;  LDL, HDL,  and Triglycerides were discussed with the patient.  2) Blood Pressure /Hypertension:  -Her blood pressure is controlled to target- low today.   she is advised to continue her current medications as prescribed by her cardiologist/nephrologist.  3) Lipids/Hyperlipidemia:    Her most recent lipid panel from 08/16/23 shows controlled LDL of 49.  She is advised to continue Atorvastatin  80 mg po daily at bedtime and Fenofibrate  48 mg po daily.  Side effects and precautions discussed with her.  4)  Weight/Diet:  Her Body mass index is 30.02 kg/m.  -She is a candidate for some weight loss. I discussed with her the fact that loss of 5 - 10% of her  current body weight will have the most impact on her diabetes management.  Exercise, and detailed carbohydrates information provided  -  detailed on discharge instructions.  5) Chronic Care/Health Maintenance: -she is on ACEI/ARB and Statin medications and  is encouraged to initiate and continue to follow up with Ophthalmology, Dentist, orthopedic surgeon, cardiology, podiatrist at least yearly or according to recommendations, and advised to  stay away from smoking (quit Sept 2023). I have recommended yearly flu vaccine and pneumonia vaccine at least every 5 years; moderate intensity exercise for up to 150 minutes weekly; and  sleep for at least 7 hours a day.     - she is advised to  maintain close follow up with Tobie Guy, DO for primary care needs, as well as her other providers for optimal and coordinated care.      I spent  47  minutes in the care of the patient today including review of labs from CMP, Lipids, Thyroid Function, Hematology (current and previous including abstractions from other facilities); face-to-face time discussing  her blood glucose readings/logs, discussing hypoglycemia and hyperglycemia episodes and symptoms, medications doses, her options of short and long term treatment based on the latest standards of care / guidelines;  discussion about incorporating lifestyle medicine;  and documenting the encounter. Risk reduction counseling performed per USPSTF guidelines to reduce obesity and cardiovascular risk factors.     Please refer to Patient Instructions for Blood Glucose Monitoring and Insulin /Medications Dosing Guide  in media tab for additional information. Please  also refer to  Patient Self Inventory in the Media  tab for reviewed elements of pertinent patient history.  [name] participated in the discussions, expressed understanding, and voiced agreement with the above plans.  All questions were answered to her satisfaction. she is encouraged to contact clinic should she have any questions or concerns prior to her return visit.     Follow up plan: - Return in about 6 months (around 02/25/2024) for Diabetes F/U with A1c in office, No previsit labs, Bring meter and logs.  Benton Rio, [facility] [address] [place], [place] [address] Phone: [phone] Fax: [phone]  08/26/2023, 11:21 AM

## 2023-10-26 ENCOUNTER — Other Ambulatory Visit: Payer: Self-pay | Admitting: Nurse Practitioner

## 2023-11-19 ENCOUNTER — Other Ambulatory Visit (HOSPITAL_COMMUNITY): Payer: Self-pay | Admitting: Cardiology

## 2023-11-19 DIAGNOSIS — I5022 Chronic systolic (congestive) heart failure: Secondary | ICD-10-CM

## 2023-11-26 ENCOUNTER — Encounter (HOSPITAL_COMMUNITY): Payer: Self-pay | Admitting: Cardiology

## 2023-11-26 ENCOUNTER — Other Ambulatory Visit: Payer: Self-pay | Admitting: Nurse Practitioner

## 2023-12-09 ENCOUNTER — Other Ambulatory Visit (HOSPITAL_COMMUNITY): Payer: Self-pay | Admitting: Cardiology

## 2023-12-11 ENCOUNTER — Encounter (INDEPENDENT_AMBULATORY_CARE_PROVIDER_SITE_OTHER): Payer: Self-pay | Admitting: Gastroenterology

## 2023-12-11 ENCOUNTER — Encounter (INDEPENDENT_AMBULATORY_CARE_PROVIDER_SITE_OTHER): Payer: Self-pay

## 2024-01-29 ENCOUNTER — Encounter (HOSPITAL_COMMUNITY): Payer: Self-pay | Admitting: Cardiology

## 2024-01-31 ENCOUNTER — Encounter (HOSPITAL_COMMUNITY): Payer: Self-pay

## 2024-01-31 ENCOUNTER — Inpatient Hospital Stay (HOSPITAL_COMMUNITY): Admission: RE | Admit: 2024-01-31 | Source: Ambulatory Visit | Admitting: Cardiology

## 2024-02-13 ENCOUNTER — Telehealth (HOSPITAL_COMMUNITY): Payer: Self-pay

## 2024-02-13 NOTE — Telephone Encounter (Signed)
 Called to confirm/remind patient of their appointment at the [facility] on 02/14/24 12:00.   Appointment:   [x] Confirmed  [] Left mess   [] No answer/No voice mail  [] VM Full/unable to leave message  [] Phone not in service  Patient reminded to bring all medications and/or complete list.  Confirmed patient has transportation. Gave directions, instructed to utilize valet parking.

## 2024-02-14 ENCOUNTER — Ambulatory Visit (HOSPITAL_COMMUNITY): Payer: Self-pay | Admitting: Cardiology

## 2024-02-14 ENCOUNTER — Encounter (HOSPITAL_COMMUNITY): Payer: Self-pay

## 2024-02-14 ENCOUNTER — Ambulatory Visit (HOSPITAL_COMMUNITY)
Admission: RE | Admit: 2024-02-14 | Discharge: 2024-02-14 | Disposition: A | Discharge status: Home / Self Care | Source: Ambulatory Visit | Attending: Cardiology | Admitting: Cardiology

## 2024-02-14 VITALS — BP 126/78 | HR 69 | Ht 67.0 in | Wt 201.6 lb

## 2024-02-14 DIAGNOSIS — I130 Hypertensive heart and chronic kidney disease with heart failure and stage 1 through stage 4 chronic kidney disease, or unspecified chronic kidney disease: Secondary | ICD-10-CM | POA: Diagnosis not present

## 2024-02-14 DIAGNOSIS — Z7984 Long term (current) use of oral hypoglycemic drugs: Secondary | ICD-10-CM | POA: Insufficient documentation

## 2024-02-14 DIAGNOSIS — E785 Hyperlipidemia, unspecified: Secondary | ICD-10-CM | POA: Diagnosis not present

## 2024-02-14 DIAGNOSIS — N183 Chronic kidney disease, stage 3 unspecified: Secondary | ICD-10-CM | POA: Insufficient documentation

## 2024-02-14 DIAGNOSIS — Z79899 Other long term (current) drug therapy: Secondary | ICD-10-CM | POA: Insufficient documentation

## 2024-02-14 DIAGNOSIS — I428 Other cardiomyopathies: Secondary | ICD-10-CM | POA: Diagnosis not present

## 2024-02-14 DIAGNOSIS — I110 Hypertensive heart disease with heart failure: Secondary | ICD-10-CM | POA: Diagnosis present

## 2024-02-14 DIAGNOSIS — I5022 Chronic systolic (congestive) heart failure: Secondary | ICD-10-CM | POA: Insufficient documentation

## 2024-02-14 DIAGNOSIS — Z7982 Long term (current) use of aspirin: Secondary | ICD-10-CM | POA: Insufficient documentation

## 2024-02-14 DIAGNOSIS — E1122 Type 2 diabetes mellitus with diabetic chronic kidney disease: Secondary | ICD-10-CM | POA: Insufficient documentation

## 2024-02-14 DIAGNOSIS — Z87891 Personal history of nicotine dependence: Secondary | ICD-10-CM | POA: Insufficient documentation

## 2024-02-14 DIAGNOSIS — Z8249 Family history of ischemic heart disease and other diseases of the circulatory system: Secondary | ICD-10-CM | POA: Insufficient documentation

## 2024-02-14 LAB — COMPREHENSIVE METABOLIC PANEL WITH GFR
ALT: 24 U/L (ref 0–44)
AST: 20 U/L (ref 15–41)
Albumin: 4.3 g/dL (ref 3.5–5.0)
Alkaline Phosphatase: 57 U/L (ref 38–126)
Anion gap: 12 (ref 5–15)
BUN: 28 mg/dL — ABNORMAL HIGH (ref 8–23)
CO2: 22 mmol/L (ref 22–32)
Calcium: 9.7 mg/dL (ref 8.9–10.3)
Chloride: 104 mmol/L (ref 98–111)
Creatinine, Ser: 1.42 mg/dL — ABNORMAL HIGH (ref 0.44–1.00)
GFR, Estimated: 42 mL/min — ABNORMAL LOW
Glucose, Bld: 206 mg/dL — ABNORMAL HIGH (ref 70–99)
Potassium: 3.7 mmol/L (ref 3.5–5.1)
Sodium: 138 mmol/L (ref 135–145)
Total Bilirubin: 0.4 mg/dL (ref 0.0–1.2)
Total Protein: 6.6 g/dL (ref 6.5–8.1)

## 2024-02-14 LAB — LIPID PANEL
Cholesterol: 136 mg/dL (ref 0–200)
HDL: 43 mg/dL
LDL Cholesterol: 58 mg/dL (ref 0–99)
Total CHOL/HDL Ratio: 3.2 ratio
Triglycerides: 179 mg/dL — ABNORMAL HIGH
VLDL: 36 mg/dL (ref 0–40)

## 2024-02-14 NOTE — Patient Instructions (Signed)
 Medication Changes:  No Changes In Medications at this time.   Lab Work:  Labs done today, your results will be available in MyChart, we will contact you for abnormal readings.  Follow-Up in: 6 MONTHS WITH DR. ROLAN PLEASE CALL OUR OFFICE AROUND APRIL 2026 TO GET SCHEDULED FOR YOUR APPOINTMENT. PHONE NUMBER IS [phone] OPTION 2   At the [facility], you and your health needs are our priority. We have a designated team specialized in the treatment of Heart Failure. This Care Team includes your primary Heart Failure Specialized Cardiologist (physician), Advanced Practice Providers (APPs- Physician Assistants and Nurse Practitioners), and Pharmacist who all work together to provide you with the care you need, when you need it.   You may see any of the following providers on your designated Care Team at your next follow up:  Dr. Toribio Fuel Dr. Ezra Rolan Dr. Odis Brownie Greig Mosses, Smith [name] [facility] [name] [name] [name], Smith [name]   Please be sure to bring in all your medications bottles to every appointment.   Need to Contact Us :  If you have any questions or concerns before your next appointment please send us  a message through [place] or call our office at [phone].    TO LEAVE A MESSAGE FOR THE NURSE SELECT OPTION 2, PLEASE LEAVE A MESSAGE INCLUDING: YOUR NAME DATE OF BIRTH CALL BACK NUMBER REASON FOR CALL**this is important as we prioritize the call backs  YOU WILL RECEIVE A CALL BACK THE SAME DAY AS LONG AS YOU CALL BEFORE 4:00 PM

## 2024-02-14 NOTE — Progress Notes (Signed)
 "  Advanced Heart Failure Clinic Note   PCP: Tobie Guy, DO Primary Cardiologist: Jayson Sierras, MD  HF Cardiology: Dr. Rolan  Vascular: Dr. Oris   Reason for Visit: F/u for HFimEF   HPI: 63 y.o. female w/ history of DM, HTN, hyperlipidemia, and smoking.  She was admitted on 1/22 for about 1 week of shortness of breath. She had been started on azithromycin and prednisone by PCP and was taking inhalers for ?COPD exacerbation without relief.  COVID negative.  Dyspnea primarily exertional, no orthopnea. No chest pain reported.  In ER at [facility], pro-BNP was elevated and CXR suggestive of volume overload. Slightly elevated HS-TnI with no trend, not suggestive of ACS.  She was admitted for management of new diagnosis of CHF and seen by Dr. Sierras.  Echo was done, showed EF 20-25% with mild RV dysfunction. She was started on IV Lasix  for diuresed then transitioned to PO.  She was transferred from [facility] to [facility] for RHC/LHC to assess filling pressures/CO and also to assess for coronary disease as etiology.    Of note, her father developed CHF in his 25s, nonischemic cardiomyopathy.  He had ICD and later heart transplant.  Grandmother also had CHF.   Cath was done 1/21 and showed no significant coronary disease and optimized filling pressures with preserved cardiac output (see angiographic and hemodynamic data below). Diagnosed w/ NICM. cMRI showed no myocardial LGE, so no definitive evidence for prior MI, infiltrative disease, or cardiomyopathy. RV normal w/ EF 35%. She was started on GDMT w/ ARB, ? blocker and MRA + loop diuretic.   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 Fairy, thought to be probably be a benign variant.   Echo 5/21 with EF 30-35% (closer to 35%), diffuse hypokinesis, mildly decreased RV systolic function, normal IVC. Echo in 11/21 showed EF 45%, diffuse hypokinesis, mildly decreased RV systolic function.  Echo in 11/22 showed stable  EF 45-50%.   2022 ABI - Noninvasive test [facility] were reviewed.  This revealed ankle arm index of 0.68 on the right and 1.0 on the left  ABI 3/24 showed moderate PAD in R leg, stable from prior study.  Echo 3/24 with EF 50-55%, RV normal.   She presents today for routine f/u. Reports doing ok. Denies CP. No resting dyspnea. Reports stable NYHA Class I-II symptoms. No LEE. BP controlled, 126/82. ReDs 26%, normal.   She reports positional dizziness, when bending over and standing too quickly. No syncope/ near syncope.   Quit smoking 3 years ago.  She denies leg claudication   Labs (10/23): LDL 70 Labs (12/23): K 3.3, creatinine 1.25 Labs (2/24): K 3.4, creatinine 1.46, BNP 51 Labs (3/24): K 4, creatinine 1.59 Labs (9/24): LDL 49 Labs (6/25):   ECG (personally reviewed): not ordered today    PMH: 1. Hyperlipidemia 2. Type 2 diabetes.  3. Chronic systolic CHF:  - Echo (1/21).  EF 20-25%, mildly decreased RV function.  - RHC/LHC (1/21): Nonobstructive CAD; mean RA 9, PA 28/5, mean PCWP 9, CI 2.37 - Cardiac MRI (1/21): LV EF 31%, RVEF 28%, No myocardial LGE.  - Echo (5/21): EF 30-35% (closer to 35%), diffuse hypokinesis, mildly decreased RV systolic function, normal IVC. 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  - Echo (11/21): EF 45%, diffuse hypokinesis, mildly decreased RV systolic function.  - Echo (11/22): EF 45-50%  - Echo (3/24):  EF 50-55%, RV normal.  4. Smoker: Quit in 2023 with Chantix .  - PFTs (6/24): Minimal obstruction 5. CKD stage 3 6. PAD: ABIs (2022) with 0.68 on right and 1.0 on left.  - ABIs (3/24): 0.77 on right, 1.0 on left  Review of Systems: All systems reviewed and negative except as per HPI.   Current Outpatient Medications  Medication Sig Dispense Refill   Accu-Chek FastClix Lancets MISC PATIENT IS  TO USE  CHECK BLOOD SUGARS TWICE DAILY as directed by the provider.  100 each 6   aspirin  EC 81 MG tablet Take 81 mg by mouth daily. Swallow whole.     atorvastatin  (LIPITOR) 80 MG tablet TAKE ONE TABLET (80MG  TOTAL) BY MOUTH DAILY 90 tablet 3   Blood Glucose Monitoring Suppl (ACCU-CHEK GUIDE ME) w/Device KIT Use to monitor blood glucose twice daily, before breakfast and before bed. 1 kit 1   Blood Pressure Monitoring (BLOOD PRESSURE CUFF) MISC 1 Device by Does not apply route as directed. To monitor blood pressure as needed daily dx: I50.9 1 each 0   buPROPion  (WELLBUTRIN  SR) 150 MG 12 hr tablet TAKE ONE TABLET BY MOUTH FOR 3 DAYS, THEN ONE TABLET TWICE DAILY THEREAFTER 180 tablet 3   busPIRone (BUSPAR) 7.5 MG tablet Take 7.5 mg by mouth 2 (two) times daily.     carvedilol  (COREG ) 6.25 MG tablet TAKE ONE TABLET BY MOUTH TWICE A DAY WITH A MEAL 180 tablet 1   ENTRESTO  24-26 MG TAKE ONE TABLET BY MOUTH TWICE A DAY 60 tablet 11   FARXIGA  10 MG TABS tablet Take 1 tablet by mouth once daily 30 tablet 0   fenofibrate  (TRICOR ) 145 MG tablet TAKE ONE TABLET (145 MG TOTAL) BY MOUTH DAILY. 90 tablet 3   FLUoxetine  (PROZAC ) 40 MG capsule Take 40 mg by mouth daily.     glipiZIDE  (GLUCOTROL ) 5 MG tablet TAKE ONE TABLET (5MG  TOTAL) BY MOUTH DAILY BEFORE BREAKFAST 90 tablet 3   glucose blood (ACCU-CHEK GUIDE TEST) test strip Patient is to check her blood sugar twice a day as directed by provider. 100 strip 2   levocetirizine (XYZAL) 5 MG tablet Take 5 mg by mouth every evening.     loperamide  (IMODIUM ) 2 MG capsule TAKE 2 CAPSULES BY MOUTH INITIALLY, THEN TAKE 1 CAPSULE AFTER EACH LOOSE STOOL. DO NOT EXCEED 8 CAPSULES IN 24 HOURS. 60 capsule 2   pantoprazole  (PROTONIX ) 40 MG tablet TAKE ONE TABLET (40MG  TOTAL) BY MOUTH DAILY 90 tablet 3   RESTASIS 0.05 % ophthalmic emulsion Place 1 drop into both eyes 2 (two) times daily.     sodium chloride  (OCEAN) 0.65 % SOLN nasal spray Place 1 spray into both nostrils 2 times daily at 12 noon and 4 pm.     spironolactone  (ALDACTONE ) 25 MG  tablet TAKE ONE TABLET (25MG  TOTAL) BY MOUTH DAILY 90 tablet 1   zolpidem  (AMBIEN ) 10 MG tablet Take 10 mg by mouth at bedtime as needed for sleep.     fluticasone (FLONASE) 50 MCG/ACT nasal spray Place 1 spray into both nostrils daily.     No current facility-administered medications for this encounter.   Allergies  Allergen Reactions   Metformin     Causes GI issues   Social History   Socioeconomic History   Marital status: Divorced    Spouse name: Not on file   Number of children: Not on file   Years of education: Not on file   Highest education level: Not on  file  Occupational History   Not on file  Tobacco Use   Smoking status: Former    Current packs/day: 0.00    Average packs/day: 0.3 packs/day for 37.0 years (9.3 ttl pk-yrs)    Types: Cigarettes    Start date: 11/19/1984    Quit date: 11/19/2021    Years since quitting: 2.2    Passive exposure: Never   Smokeless tobacco: Never   Tobacco comments:    1-2 cigarettes daily  Vaping Use   Vaping status: Never Used  Substance and Sexual Activity   Alcohol use: No   Drug use: No   Sexual activity: Yes    Birth control/protection: None  Other Topics Concern   Not on file  Social History Narrative   Not on file   Social Drivers of Health   Tobacco Use: Medium Risk (02/14/2024)   Patient History    Smoking Tobacco Use: Former    Smokeless Tobacco Use: Never    Passive Exposure: Never  Physicist, Medical Strain: Low Risk (08/29/2023)   Received from [facility]   Overall Financial Resource Strain (CARDIA)    How hard is it for you to pay for the very basics like food, housing, medical care, and heating?: Not very hard  Recent Concern: Financial Resource Strain - Medium Risk (07/03/2023)   Received from [facility]   Overall Financial Resource Strain (CARDIA)    Difficulty of Paying Living Expenses: Somewhat hard  Food Insecurity: No Food Insecurity (11/13/2023)   Received from [facility]   Epic     Within the past 12 months, you worried that your food would run out before you got the money to buy more.: Never true    Within the past 12 months, the food you bought just didn't last and you didn't have money to get more.: Never true  Transportation Needs: No Transportation Needs (11/13/2023)   Received from [facility]   PRAPARE - Transportation    Lack of Transportation (Medical): No    Lack of Transportation (Non-Medical): No  Physical Activity: Insufficiently Active (07/10/2023)   Received from [facility]   Exercise Vital Sign    On average, how many days per week do you engage in moderate to strenuous exercise (like a brisk walk)?: 5 days    On average, how many minutes do you engage in exercise at this level?: 20 min  Stress: Stress Concern Present (04/02/2023)   Received from [facility] of [facility]ational [facility] - Occupational Stress Questionnaire    Feeling of Stress : To some extent  Social Connections: Moderately Isolated (04/02/2023)   Received from [facility]   Social Connection and Isolation Panel    In a typical week, how many times do you talk on the phone with family, friends, or neighbors?: Once a week    How often do you get together with friends or relatives?: More than three times a week    How often do you attend church or religious services?: 1 to 4 times per year    Do you belong to any clubs or organizations such as church groups, unions, fraternal or athletic groups, or school groups?: No    How often do you attend meetings of the clubs or organizations you belong to?: Never    Are you married, widowed, divorced, separated, never married, or living with a partner?: Divorced  Intimate Partner Violence: Not At Risk (11/13/2023)   Received  from [facility]   Epic    Within the last year, have you been afraid of your partner or ex-partner?: No    Within the last year, have you been humiliated or emotionally abused in other ways by  your partner or ex-partner?: No    Within the last year, have you been kicked, hit, slapped, or otherwise physically hurt by your partner or ex-partner?: No    Within the last year, have you been raped or forced to have any kind of sexual activity by your partner or ex-partner?: No  Depression (PHQ2-9): Not on file  Alcohol Screen: Not on file  Housing: Not on file  Utilities: Low Risk (11/13/2023)   Received from [facility]   Utilities    Within the past 12 months, have you been unable to get utilities(heat, electricity) when it was really needed?: No  Health Literacy: Low Risk (11/13/2023)   Received from [facility] Literacy    How often do you need to have someone help you when you read instructions, pamphlets, or other written material from your doctor or pharmacy?: Never    Family History  Problem Relation Age of Onset   Arthritis Mother    Hernia Mother    Constipation Mother    Heart failure Father        Underwent heart transplant   Colon cancer Neg Hx    BP 126/78 (BP Location: Right Arm, Patient Position: Sitting, Cuff Size: Normal)   Pulse 69   Ht 5' 7 (1.702 m)   Wt 91.4 kg (201 lb 9.6 oz)   LMP 09/25/2011   SpO2 97%   BMI 31.58 kg/m   Wt Readings from Last 3 Encounters:  02/14/24 91.4 kg (201 lb 9.6 oz)  08/26/23 85.9 kg (189 lb 6.4 oz)  08/16/23 85.8 kg (189 lb 3.2 oz)   Physical Exam  ReDs 26%, normal  GENERAL: NAD Lungs- clear  CARDIAC:  JVP not elevated         Normal rate with regular rhythm. No MRG. No LEE  ABDOMEN: Soft, non-tender, non-distended.  EXTREMITIES: Warm and well perfused.  NEUROLOGIC: No obvious FND    ASSESSMENT & PLAN: 1. Chronic Systolic CHF: Echo 1/21 w/ EF 79-74%, mild RV dysfunction.  No prior cardiac history but father has history of nonischemic cardiomyopathy with heart transplant and paternal grandmother also had CHF.  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  Fairy, probably benign variant.  LHC/RHC 1/21 showed no significant coronary disease and optimized filling pressures with preserved cardiac output. cMRI showed no myocardial LGE, so no definitive evidence for prior MI, infiltrative disease, or cardiomyopathy.  Echo 5/21 showed mild improvement in LV function, EF 30-35% (probably closer to 35%).  Echo in 11/22 with improved EF 45-50%. Echo in 3/24 with EF 50-55%, RV normal. NYHA Class I-II. Euvolemic on exam and by ReDs 26%. Orthostatsis limits med titration.  - Continue Entresto  24-26 mg bid - Continue Farxiga  10 mg daily  - Continue Spironolactone  25 mg at bedtime - Continue Coreg  6.25 mg bid  - Check BMP today  2. H/o Tobacco Use: She was a 30 year, 1/2 ppd smoker. She quit smoking 3 years ago  - congratulated on efforts  3. HTN: controlled on current regimen - meds per above  4. Type 2 diabetes: followed by Endocrinology  - continue Farxiga   5. Hyperlipidemia: LDL goal < 55. Continue Atorvastatin  80 mg  daily  - check LP and HFTs  6. PAD: ABIs (3/24) showed moderate PAD in R leg, stable from prior study. She has quit smoking and denies claudication.  - She will contact [facility] if pain worsens, or occurs with rest. - continue ASA + statin   F/u w/ Dr. Rolan in 6 months   [name], PA-C 02/14/2024  "

## 2024-02-14 NOTE — Progress Notes (Signed)
"   ReDS Vest / Clip - 02/14/24 1221       ReDS Vest / Clip   Station Marker D    Ruler Value 32    ReDS Value Range Low volume    ReDS Actual Value 26           "

## 2024-02-22 ENCOUNTER — Other Ambulatory Visit (INDEPENDENT_AMBULATORY_CARE_PROVIDER_SITE_OTHER): Payer: Self-pay | Admitting: Gastroenterology

## 2024-02-25 ENCOUNTER — Ambulatory Visit: Admitting: Nurse Practitioner

## 2024-02-25 ENCOUNTER — Encounter: Payer: Self-pay | Admitting: Nurse Practitioner

## 2024-02-25 VITALS — BP 110/70 | HR 62 | Ht 66.6 in | Wt 201.6 lb

## 2024-02-25 DIAGNOSIS — E782 Mixed hyperlipidemia: Secondary | ICD-10-CM

## 2024-02-25 DIAGNOSIS — N1831 Chronic kidney disease, stage 3a: Secondary | ICD-10-CM

## 2024-02-25 DIAGNOSIS — Z7984 Long term (current) use of oral hypoglycemic drugs: Secondary | ICD-10-CM

## 2024-02-25 DIAGNOSIS — E1122 Type 2 diabetes mellitus with diabetic chronic kidney disease: Secondary | ICD-10-CM | POA: Diagnosis not present

## 2024-02-25 DIAGNOSIS — F17200 Nicotine dependence, unspecified, uncomplicated: Secondary | ICD-10-CM

## 2024-02-25 DIAGNOSIS — I10 Essential (primary) hypertension: Secondary | ICD-10-CM

## 2024-02-25 LAB — POCT GLYCOSYLATED HEMOGLOBIN (HGB A1C): Hemoglobin A1C: 7.7 % — AB (ref 4.0–5.6)

## 2024-02-25 MED ORDER — GLIPIZIDE 5 MG PO TABS: 5.0000 mg | ORAL_TABLET | Freq: Two times a day (BID) | ORAL | 1 refills | Status: AC

## 2024-02-25 MED ORDER — FARXIGA 10 MG PO TABS: 10.0000 mg | ORAL_TABLET | Freq: Every day | ORAL | 3 refills | Status: AC

## 2024-02-25 NOTE — Progress Notes (Signed)
 "                                                                     02/25/2024, 10:53 AM  Endocrinology follow-up note   Subjective:    Patient ID: [name], female    DOB: 05-08-1960.  [name] is being seen in follow-up after she was seen in consultation for management of currently uncontrolled symptomatic diabetes requested by  Tobie Guy, DO.   Past Medical History:  Diagnosis Date   Anal fissure    Anxiety    Chronic systolic (congestive) heart failure (HCC)    a. 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45   Mixed hyperlipidemia    Nonischemic cardiomyopathy (HCC)    Type 2 diabetes mellitus (HCC)     Past Surgical History:  Procedure Laterality Date   BACK SURGERY  2005   lumbar disckectomy   COLONOSCOPY  10/01/2011   Procedure: COLONOSCOPY;  Surgeon: Claudis RAYMOND Rivet, MD;  [facility];  Service: Endoscopy;  Laterality: N/A;  730   COLONOSCOPY N/A 10/04/2016   Procedure: COLONOSCOPY;  Surgeon: Rivet Claudis RAYMOND, MD;  [facility];  Service: Endoscopy;  Laterality: N/A;  10:30   COLONOSCOPY WITH PROPOFOL  N/A 02/13/2022   Procedure: COLONOSCOPY WITH PROPOFOL ;  Surgeon: Eartha Angelia Sieving, MD;  [facility]: [facility];  Service: Gastroenterology;  Laterality: N/A;  945 am ASA 3   DILATION AND CURETTAGE OF UTERUS     FOOT BONE EXCISION     right   POLYPECTOMY  10/04/2016   Procedure: POLYPECTOMY;  Surgeon: Rivet Claudis RAYMOND, MD;  [facility];  Service: Endoscopy;;  colon   POLYPECTOMY  02/13/2022   Procedure: POLYPECTOMY;  Surgeon: Eartha Angelia Sieving, MD;  [facility];  Service: Gastroenterology;;   RIGHT/LEFT HEART CATH AND CORONARY ANGIOGRAPHY N/A 03/23/2019   Procedure: RIGHT/LEFT HEART CATH AND CORONARY ANGIOGRAPHY;  Surgeon: Rolan Ezra RAMAN,  MD;  Location: [facility] INVAS[facility] CV LAB;  Service: Cardiovascular;  Laterality: N/A;    Social History   Socioeconomic History   Marital status: Divorced    Spouse name: Not on file   Number of children: Not on file   Years of education: Not on file   Highest education level: Not on file  Occupational History   Not on file  Tobacco Use   Smoking status: Former    Current packs/day: 0.00    Average packs/day: 0.3 packs/day for 37.0 years (9.3 ttl pk-yrs)    Types: Cigarettes    Start date: 11/19/1984    Quit date: 11/19/2021    Years since quitting: 2.2    Passive exposure: Never   Smokeless tobacco: Never   Tobacco comments:    1-2 cigarettes daily  Vaping Use   Vaping status: Never Used  Substance and Sexual Activity   Alcohol use: No   Drug use: No   Sexual activity: Yes    Birth control/protection: None  Other Topics Concern   Not on file  Social  History Narrative   Not on file   Social Drivers of Health   Tobacco Use: Medium Risk (02/25/2024)   Patient History    Smoking Tobacco Use: Former    Smokeless Tobacco Use: Never    Passive Exposure: Never  Physicist, Medical Strain: Low Risk (08/29/2023)   Received from [facility]   Overall Financial Resource Strain (CARDIA)    How hard is it for you to pay for the very basics like food, housing, medical care, and heating?: Not very hard  Recent Concern: Financial Resource Strain - Medium Risk (07/03/2023)   Received from [facility]   Overall Financial Resource Strain (CARDIA)    Difficulty of Paying Living Expenses: Somewhat hard  Food Insecurity: No Food Insecurity (11/13/2023)   Received from [facility]   Epic    Within the past 12 months, you worried that your food would run out before you got the money to buy more.: Never true    Within the past 12 months, the food you bought just didn't last and you didn't have money to get more.: Never true  Transportation Needs: No Transportation Needs (11/13/2023)    Received from [facility]   PRAPARE - Transportation    Lack of Transportation (Medical): No    Lack of Transportation (Non-Medical): No  Physical Activity: Insufficiently Active (07/10/2023)   Received from [facility]   Exercise Vital Sign    On average, how many days per week do you engage in moderate to strenuous exercise (like a brisk walk)?: 5 days    On average, how many minutes do you engage in exercise at this level?: 20 min  Stress: Stress Concern Present (04/02/2023)   Received from [facility] - Occupational Stress Questionnaire    Feeling of Stress : To some extent  Social Connections: Moderately Isolated (04/02/2023)   Received from [facility]   Social Connection and Isolation Panel    In a typical week, how many times do you talk on the phone with family, friends, or neighbors?: Once a week    How often do you get together with friends or relatives?: More than three times a week    How often do you attend church or religious services?: 1 to 4 times per year    Do you belong to any clubs or organizations such as church groups, unions, fraternal or athletic groups, or school groups?: No    How often do you attend meetings of the clubs or organizations you belong to?: Never    Are you married, widowed, divorced, separated, never married, or living with a partner?: Divorced  Depression (PHQ2-9): Not on file  Alcohol Screen: Not on file  Housing: Not on file  Utilities: Low Risk (11/13/2023)   Received from [facility]   Utilities    Within the past 12 months, have you been unable to get utilities(heat, electricity) when it was really needed?: No  Health Literacy: Low Risk (11/13/2023)   Received from [facility]    How often do you need to have someone help you when you read instructions, pamphlets, or other written material from your doctor or pharmacy?: Never    Family History  Problem  Relation Age of Onset   Arthritis Mother    Hernia Mother    Constipation Mother    Heart failure Father  Underwent heart transplant   Colon cancer Neg Hx     Outpatient Encounter Medications as of 02/25/2024  Medication Sig   Accu-Chek FastClix Lancets MISC PATIENT IS  TO USE  CHECK BLOOD SUGARS TWICE DAILY as directed by the provider.   aspirin  EC 81 MG tablet Take 81 mg by mouth daily. Swallow whole.   atorvastatin  (LIPITOR) 80 MG tablet TAKE ONE TABLET (80MG  TOTAL) BY MOUTH DAILY   Blood Glucose Monitoring Suppl (ACCU-CHEK GUIDE ME) w/Device KIT Use to monitor blood glucose twice daily, before breakfast and before bed.   Blood Pressure Monitoring (BLOOD PRESSURE CUFF) MISC 1 Device by Does not apply route as directed. To monitor blood pressure as needed daily dx: I50.9   buPROPion  (WELLBUTRIN  SR) 150 MG 12 hr tablet TAKE ONE TABLET BY MOUTH FOR 3 DAYS, THEN ONE TABLET TWICE DAILY THEREAFTER   busPIRone (BUSPAR) 7.5 MG tablet Take 7.5 mg by mouth 2 (two) times daily.   carvedilol  (COREG ) 6.25 MG tablet TAKE ONE TABLET BY MOUTH TWICE A DAY WITH A MEAL   ENTRESTO  24-26 MG TAKE ONE TABLET BY MOUTH TWICE A DAY   fenofibrate  (TRICOR ) 145 MG tablet TAKE ONE TABLET (145 MG TOTAL) BY MOUTH DAILY.   FLUoxetine  (PROZAC ) 40 MG capsule Take 40 mg by mouth daily.   glucose blood (ACCU-CHEK GUIDE TEST) test strip Patient is to check her blood sugar twice a day as directed by provider.   levocetirizine (XYZAL) 5 MG tablet Take 5 mg by mouth every evening.   loperamide  (IMODIUM ) 2 MG capsule TAKE 2 CAPSULES BY MOUTH INITIALLY, THEN TAKE 1 CAPSULE AFTER EACH LOOSE STOOL. DO NOT EXCEED 8 CAPSULES IN 24 HOURS. (Patient taking differently: TAKE 2 CAPSULES BY MOUTH INITIALLY, THEN TAKE 1 CAPSULE AFTER EACH LOOSE STOOL. DO NOT EXCEED 8 CAPSULES IN 24 HOURS. Patient takes as needed)   pantoprazole  (PROTONIX ) 40 MG tablet TAKE ONE TABLET (40MG  TOTAL) BY MOUTH DAILY   RESTASIS 0.05 % ophthalmic emulsion  Place 1 drop into both eyes 2 (two) times daily.   sodium chloride  (OCEAN) 0.65 % SOLN nasal spray Place 1 spray into both nostrils 2 times daily at 12 noon and 4 pm.   spironolactone  (ALDACTONE ) 25 MG tablet TAKE ONE TABLET (25MG  TOTAL) BY MOUTH DAILY   zolpidem  (AMBIEN ) 10 MG tablet Take 10 mg by mouth at bedtime as needed for sleep.   [DISCONTINUED] FARXIGA  10 MG TABS tablet Take 1 tablet by mouth once daily   [DISCONTINUED] glipiZIDE  (GLUCOTROL ) 5 MG tablet TAKE ONE TABLET (5MG  TOTAL) BY MOUTH DAILY BEFORE BREAKFAST   FARXIGA  10 MG TABS tablet Take 1 tablet (10 mg total) by mouth daily.   fluticasone (FLONASE) 50 MCG/ACT nasal spray Place 1 spray into both nostrils daily.   glipiZIDE  (GLUCOTROL ) 5 MG tablet Take 1 tablet (5 mg total) by mouth 2 (two) times daily before a meal. TAKE ONE TABLET (5MG  TOTAL) BY MOUTH DAILY BEFORE BREAKFAST   No facility-administered encounter medications on file as of 02/25/2024.    ALLERGIES: Allergies  Allergen Reactions   Metformin     Causes GI issues    VACCINATION STATUS:  There is no immunization history on file for this patient.  Diabetes She presents for her follow-up diabetic visit. She has type 2 diabetes mellitus. Onset time: She was diagnosed at approximate age of 31 years. Her disease course has been stable. There are no hypoglycemic associated symptoms. Pertinent negatives for hypoglycemia include no confusion, pallor or seizures. Associated  symptoms include fatigue. Pertinent negatives for diabetes include no polydipsia, no polyphagia and no polyuria. There are no hypoglycemic complications. Symptoms are stable. Diabetic complications include heart disease and nephropathy. Risk factors for coronary artery disease include dyslipidemia, diabetes mellitus, obesity, sedentary lifestyle, post-menopausal and tobacco exposure. Current diabetic treatment includes oral agent (dual therapy). She is compliant with treatment most of the time. Her weight  is fluctuating minimally. She is following a generally unhealthy diet. When asked about meal planning, she reported none. She has not had a previous visit with a dietitian. She rarely participates in exercise. Her home blood glucose trend is fluctuating minimally. Her overall blood glucose range is 180-200 mg/dl. (She presents today with her meter and logs showing above target glycemic profile overall.  Her POCT A1c today is 7.7%, increasing from last visit of 6.8%.  She denies any hypoglycemia.  Analysis of her meter shows 7-day average of 183, 14-day average of 179, 30-day average of 165, 90-day average of 163.  She notes her PCP mentioned changing her Glipizide  to a different medication.) An ACE inhibitor/angiotensin II receptor blocker is being taken. Eye exam is current.    Review of systems  Constitutional: + increasing body weight,  current Body mass index is 31.96 kg/m. , no fatigue, no subjective hyperthermia, no subjective hypothermia Eyes: no blurry vision, no xerophthalmia ENT: no sore throat, no nodules palpated in throat, no dysphagia/odynophagia, no hoarseness Cardiovascular: no chest pain, no shortness of breath, no palpitations, no leg swelling Respiratory: no cough, no shortness of breath Gastrointestinal: no nausea/vomiting/diarrhea Musculoskeletal: no muscle/joint aches Skin: no rashes, no hyperemia Neurological: no tremors, no numbness, no tingling, no dizziness Psychiatric: no depression, no anxiety   Objective:    BP 110/70 (BP Location: Left Arm, Patient Position: Sitting, Cuff Size: Large)   Pulse 62   Ht 5' 6.6 (1.692 m)   Wt 201 lb 9.6 oz (91.4 kg)   LMP 09/25/2011   BMI 31.96 kg/m   Wt Readings from Last 3 Encounters:  02/25/24 201 lb 9.6 oz (91.4 kg)  02/14/24 201 lb 9.6 oz (91.4 kg)  08/26/23 189 lb 6.4 oz (85.9 kg)    BP Readings from Last 3 Encounters:  02/25/24 110/70  02/14/24 126/78  08/26/23 98/64     Physical Exam-  Limited  Constitutional:  Body mass index is 31.96 kg/m. , not in acute distress, normal state of mind Eyes:  EOMI, no exophthalmos Musculoskeletal: no gross deformities, strength intact in all four extremities, no gross restriction of joint movements Skin:  no rashes, no hyperemia Neurological: no tremor with outstretched hands    Diabetic Foot Exam - Simple   No data filed     CMP ( most recent) CMP     Component Value Date/Time   NA 138 02/14/2024 1227   NA 139 07/15/2019 0825   K 3.7 02/14/2024 1227   CL 104 02/14/2024 1227   CO2 22 02/14/2024 1227   GLUCOSE 206 (H) 02/14/2024 1227   BUN 28 (H) 02/14/2024 1227   BUN 27 (H) 07/15/2019 0825   CREATININE 1.42 (H) 02/14/2024 1227   CREATININE 1.12 (H) 06/05/2019 0710   CALCIUM  9.7 02/14/2024 1227   PROT 6.6 02/14/2024 1227   ALBUMIN 4.3 02/14/2024 1227   AST 20 02/14/2024 1227   ALT 24 02/14/2024 1227   ALKPHOS 57 02/14/2024 1227   BILITOT 0.4 02/14/2024 1227   GFRNONAA 42 (L) 02/14/2024 1227   GFRAA 54 (L) 10/02/2019 1228  Diabetic Labs (most recent): Lab Results  Component Value Date   HGBA1C 7.7 (A) 02/25/2024   HGBA1C 6.8 (A) 08/26/2023   HGBA1C 6.8 (A) 02/26/2023   MICROALBUR 10 08/04/2020    Assessment & Plan:   1) Diabetes mellitus with stage 3 chronic kidney disease (HCC)  - [name] has currently uncontrolled symptomatic type 2 DM since  63 years of age.   She presents today with her meter and logs showing above target glycemic profile overall.  Her POCT A1c today is 7.7%, increasing from last visit of 6.8%.  She denies any hypoglycemia.  Analysis of her meter shows 7-day average of 183, 14-day average of 179, 30-day average of 165, 90-day average of 163.  She notes her PCP mentioned changing her Glipizide  to a different medication.  - I had a long discussion with her about the progressive nature of diabetes and the pathology behind its complications. -her diabetes is complicated by CHF, CKD  stage 3b , smoking and she remains at a high risk for more acute and chronic complications which include CAD, CVA, CKD, retinopathy, and neuropathy. These are all discussed in detail with her.  - Nutritional counseling repeated/built upon at each appointment.  - The patient admits there is a room for improvement in their diet and drink choices. -  Suggestion is made for the patient to avoid simple carbohydrates from their diet including Cakes, Sweet Desserts / Pastries, Ice Cream, Soda (diet and regular), Sweet Tea, Candies, Chips, Cookies, Sweet Pastries, Store Bought Juices, Alcohol in Excess of 1-2 drinks a day, Artificial Sweeteners, Coffee Creamer, and Sugar-free Products. This will help patient to have stable blood glucose profile and potentially avoid unintended weight gain.   - I encouraged the patient to switch to unprocessed or minimally processed complex starch and increased protein intake (animal or plant source), fruits, and vegetables.   - Patient is advised to stick to a routine mealtimes to eat 3 meals a day and avoid unnecessary snacks (to snack only to correct hypoglycemia).  - I have approached her with the following individualized plan to manage her diabetes and patient agrees:   - She did not tolerate Metformin.  -She is advised to continue her Farxiga  10 mg po daily (as prescribed by cardiology) and will increase her Glipizide  to 5 mg PO TWICE daily.  I did check, Rybelsus is not on her formulary this year (maybe that will change for the upcoming year).  She prefers to stay away from injectables at this time.  -She is encouraged to continue monitoring blood glucose twice daily, before breakfast and before bed, and to call the clinic if she has readings less than 70 or greater than 200 for 3 tests in a row.    - she is a good candidate for incretin therapy, quit smoking over 3 years ago.  -Addition of basal insulin  would be the safest option if she loses control on  subsequent visits.  - Specific targets for  A1c;  LDL, HDL,  and Triglycerides were discussed with the patient.  2) Blood Pressure /Hypertension:  -Her blood pressure is controlled to target.   she is advised to continue her current medications as prescribed by her cardiologist/nephrologist.  3) Lipids/Hyperlipidemia:    Her most recent lipid panel from 08/16/23 shows controlled LDL of 49.  She is advised to continue Atorvastatin  80 mg po daily at bedtime and Fenofibrate  48 mg po daily.  Side effects and precautions discussed with her.  4)  Weight/Diet:  Her Body mass index is 31.96 kg/m.  -She is a candidate for some weight loss. I discussed with her the fact that loss of 5 - 10% of her  current body weight will have the most impact on her diabetes management.  Exercise, and detailed carbohydrates information provided  -  detailed on discharge instructions.  5) Chronic Care/Health Maintenance: -she is on ACEI/ARB and Statin medications and  is encouraged to initiate and continue to follow up with Ophthalmology, Dentist, orthopedic surgeon, cardiology, podiatrist at least yearly or according to recommendations, and advised to  stay away from smoking (quit Sept 2023). I have recommended yearly flu vaccine and pneumonia vaccine at least every 5 years; moderate intensity exercise for up to 150 minutes weekly; and  sleep for at least 7 hours a day.     - she is advised to maintain close follow up with Tobie Guy, DO for primary care needs, as well as her other providers for optimal and coordinated care.     I spent  44  minutes in the care of the patient today including review of labs from CMP, Lipids, Thyroid Function, Hematology (current and previous including abstractions from other facilities); face-to-face time discussing  her blood glucose readings/logs, discussing hypoglycemia and hyperglycemia episodes and symptoms, medications doses, her options of short and long term treatment based  on the latest standards of care / guidelines;  discussion about incorporating lifestyle medicine;  and documenting the encounter. Risk reduction counseling performed per USPSTF guidelines to reduce obesity and cardiovascular risk factors.     Please refer to Patient Instructions for Blood Glucose Monitoring and Insulin /Medications Dosing Guide  in media tab for additional information. Please  also refer to  Patient Self Inventory in the Media  tab for reviewed elements of pertinent patient history.  [name] participated in the discussions, expressed understanding, and voiced agreement with the above plans.  All questions were answered to her satisfaction. she is encouraged to contact clinic should she have any questions or concerns prior to her return visit.     Follow up plan: - Return in about 4 months (around 06/25/2024) for Diabetes F/U with A1c in office, No previsit labs, Bring meter and logs.  Benton Rio, [facility] [address] [place], [place] [address] Phone: [phone] Fax: [phone]  02/25/2024, 10:53 AM    "

## 2024-03-21 ENCOUNTER — Other Ambulatory Visit: Payer: Self-pay | Admitting: Nurse Practitioner

## 2024-03-21 DIAGNOSIS — Z7984 Long term (current) use of oral hypoglycemic drugs: Secondary | ICD-10-CM

## 2024-03-21 DIAGNOSIS — E1122 Type 2 diabetes mellitus with diabetic chronic kidney disease: Secondary | ICD-10-CM

## 2024-06-26 ENCOUNTER — Ambulatory Visit: Admitting: Nurse Practitioner
